# Patient Record
Sex: Female | Born: 1960
Health system: Southern US, Community
[De-identification: ages and names within clinical notes are randomized; demographics above are authoritative.]

## PROBLEM LIST (undated history)

## (undated) DIAGNOSIS — F32A Depression, unspecified: Secondary | ICD-10-CM

## (undated) DIAGNOSIS — N920 Excessive and frequent menstruation with regular cycle: Secondary | ICD-10-CM

## (undated) DIAGNOSIS — I10 Essential (primary) hypertension: Secondary | ICD-10-CM

## (undated) DIAGNOSIS — B20 Human immunodeficiency virus [HIV] disease: Secondary | ICD-10-CM

## (undated) DIAGNOSIS — R350 Frequency of micturition: Secondary | ICD-10-CM

## (undated) DIAGNOSIS — E876 Hypokalemia: Secondary | ICD-10-CM

## (undated) DIAGNOSIS — S0990XA Unspecified injury of head, initial encounter: Secondary | ICD-10-CM

## (undated) DIAGNOSIS — R21 Rash and other nonspecific skin eruption: Secondary | ICD-10-CM

## (undated) DIAGNOSIS — E119 Type 2 diabetes mellitus without complications: Secondary | ICD-10-CM

## (undated) DIAGNOSIS — K219 Gastro-esophageal reflux disease without esophagitis: Secondary | ICD-10-CM

## (undated) DIAGNOSIS — I639 Cerebral infarction, unspecified: Secondary | ICD-10-CM

## (undated) DIAGNOSIS — B182 Chronic viral hepatitis C: Secondary | ICD-10-CM

## (undated) DIAGNOSIS — J309 Allergic rhinitis, unspecified: Secondary | ICD-10-CM

## (undated) DIAGNOSIS — L409 Psoriasis, unspecified: Secondary | ICD-10-CM

## (undated) DIAGNOSIS — R531 Weakness: Secondary | ICD-10-CM

## (undated) DIAGNOSIS — F419 Anxiety disorder, unspecified: Secondary | ICD-10-CM

## (undated) DIAGNOSIS — M009 Pyogenic arthritis, unspecified: Secondary | ICD-10-CM

## (undated) DIAGNOSIS — R600 Localized edema: Secondary | ICD-10-CM

## (undated) DIAGNOSIS — M1990 Unspecified osteoarthritis, unspecified site: Secondary | ICD-10-CM

## (undated) DIAGNOSIS — M1611 Unilateral primary osteoarthritis, right hip: Secondary | ICD-10-CM

## (undated) HISTORY — DX: Anxiety disorder, unspecified: F41.9

## (undated) HISTORY — DX: Rash and other nonspecific skin eruption: R21

## (undated) HISTORY — DX: Type 2 diabetes mellitus without complications: E11.9

## (undated) HISTORY — DX: Depression, unspecified: F32.A

## (undated) HISTORY — DX: Weakness: R53.1

## (undated) HISTORY — DX: Localized edema: R60.0

## (undated) HISTORY — DX: Morbid (severe) obesity due to excess calories: E66.01

## (undated) HISTORY — PX: TOTAL KNEE ARTHROPLASTY: SHX125

## (undated) HISTORY — PX: AXILLARY LYMPH NODE DISSECTION: SHX5229

## (undated) HISTORY — DX: Gastro-esophageal reflux disease without esophagitis: K21.9

## (undated) HISTORY — DX: Cerebral infarction, unspecified: I63.9

## (undated) HISTORY — PX: TOTAL ABDOMINAL HYSTERECTOMY: SHX209

## (undated) HISTORY — DX: Frequency of micturition: R35.0

## (undated) HISTORY — DX: Hypokalemia: E87.6

## (undated) HISTORY — DX: Unilateral primary osteoarthritis, right hip: M16.11

---

## 1998-03-02 ENCOUNTER — Other Ambulatory Visit: Admission: RE | Admit: 1998-03-02 | Discharge: 1998-03-02 | Payer: Self-pay | Admitting: Internal Medicine

## 1998-03-15 ENCOUNTER — Ambulatory Visit (HOSPITAL_BASED_OUTPATIENT_CLINIC_OR_DEPARTMENT_OTHER): Admission: RE | Admit: 1998-03-15 | Discharge: 1998-03-15 | Payer: Self-pay | Admitting: Orthopedic Surgery

## 1998-04-14 ENCOUNTER — Other Ambulatory Visit: Admission: RE | Admit: 1998-04-14 | Discharge: 1998-04-14 | Payer: Self-pay | Admitting: Internal Medicine

## 1998-04-22 ENCOUNTER — Emergency Department (HOSPITAL_COMMUNITY): Admission: EM | Admit: 1998-04-22 | Discharge: 1998-04-22 | Payer: Self-pay | Admitting: Emergency Medicine

## 1998-07-05 ENCOUNTER — Emergency Department (HOSPITAL_COMMUNITY): Admission: EM | Admit: 1998-07-05 | Discharge: 1998-07-05 | Payer: Self-pay | Admitting: Emergency Medicine

## 1998-07-09 ENCOUNTER — Encounter: Payer: Self-pay | Admitting: Emergency Medicine

## 1998-07-09 ENCOUNTER — Emergency Department (HOSPITAL_COMMUNITY): Admission: EM | Admit: 1998-07-09 | Discharge: 1998-07-09 | Payer: Self-pay | Admitting: Emergency Medicine

## 1998-08-10 ENCOUNTER — Encounter: Payer: Self-pay | Admitting: Emergency Medicine

## 1998-08-10 ENCOUNTER — Emergency Department (HOSPITAL_COMMUNITY): Admission: EM | Admit: 1998-08-10 | Discharge: 1998-08-10 | Payer: Self-pay | Admitting: Emergency Medicine

## 1998-10-12 ENCOUNTER — Ambulatory Visit (HOSPITAL_BASED_OUTPATIENT_CLINIC_OR_DEPARTMENT_OTHER): Admission: RE | Admit: 1998-10-12 | Discharge: 1998-10-12 | Payer: Self-pay | Admitting: Orthopedic Surgery

## 1998-12-17 ENCOUNTER — Emergency Department (HOSPITAL_COMMUNITY): Admission: EM | Admit: 1998-12-17 | Discharge: 1998-12-17 | Payer: Self-pay | Admitting: Emergency Medicine

## 1999-04-20 ENCOUNTER — Emergency Department (HOSPITAL_COMMUNITY): Admission: EM | Admit: 1999-04-20 | Discharge: 1999-04-20 | Payer: Self-pay | Admitting: Emergency Medicine

## 1999-07-11 ENCOUNTER — Encounter: Payer: Self-pay | Admitting: Orthopedic Surgery

## 1999-07-12 ENCOUNTER — Inpatient Hospital Stay (HOSPITAL_COMMUNITY): Admission: RE | Admit: 1999-07-12 | Discharge: 1999-07-15 | Payer: Self-pay | Admitting: Orthopedic Surgery

## 1999-10-12 ENCOUNTER — Ambulatory Visit (HOSPITAL_COMMUNITY): Admission: RE | Admit: 1999-10-12 | Discharge: 1999-10-12 | Payer: Self-pay | Admitting: Internal Medicine

## 1999-10-12 ENCOUNTER — Encounter: Payer: Self-pay | Admitting: Internal Medicine

## 1999-11-13 DIAGNOSIS — M009 Pyogenic arthritis, unspecified: Secondary | ICD-10-CM

## 1999-11-13 HISTORY — DX: Pyogenic arthritis, unspecified: M00.9

## 1999-12-16 ENCOUNTER — Inpatient Hospital Stay (HOSPITAL_COMMUNITY): Admission: EM | Admit: 1999-12-16 | Discharge: 1999-12-23 | Payer: Self-pay | Admitting: Emergency Medicine

## 2000-02-06 ENCOUNTER — Encounter: Admission: RE | Admit: 2000-02-06 | Discharge: 2000-02-06 | Payer: Self-pay | Admitting: Internal Medicine

## 2000-03-04 ENCOUNTER — Inpatient Hospital Stay (HOSPITAL_COMMUNITY): Admission: RE | Admit: 2000-03-04 | Discharge: 2000-03-09 | Payer: Self-pay | Admitting: Orthopedic Surgery

## 2000-03-08 ENCOUNTER — Encounter: Payer: Self-pay | Admitting: Orthopedic Surgery

## 2000-05-14 ENCOUNTER — Encounter: Admission: RE | Admit: 2000-05-14 | Discharge: 2000-05-14 | Payer: Self-pay | Admitting: Internal Medicine

## 2000-07-16 ENCOUNTER — Encounter: Admission: RE | Admit: 2000-07-16 | Discharge: 2000-07-16 | Payer: Self-pay | Admitting: Internal Medicine

## 2000-09-17 ENCOUNTER — Encounter: Admission: RE | Admit: 2000-09-17 | Discharge: 2000-09-17 | Payer: Self-pay | Admitting: Internal Medicine

## 2000-10-15 ENCOUNTER — Ambulatory Visit (HOSPITAL_COMMUNITY): Admission: RE | Admit: 2000-10-15 | Discharge: 2000-10-15 | Payer: Self-pay | Admitting: Internal Medicine

## 2000-10-15 ENCOUNTER — Encounter: Payer: Self-pay | Admitting: Internal Medicine

## 2000-11-12 DIAGNOSIS — B20 Human immunodeficiency virus [HIV] disease: Secondary | ICD-10-CM

## 2000-11-12 DIAGNOSIS — Z21 Asymptomatic human immunodeficiency virus [HIV] infection status: Secondary | ICD-10-CM | POA: Insufficient documentation

## 2000-11-12 DIAGNOSIS — Z8619 Personal history of other infectious and parasitic diseases: Secondary | ICD-10-CM

## 2000-11-12 DIAGNOSIS — B182 Chronic viral hepatitis C: Secondary | ICD-10-CM

## 2000-11-12 HISTORY — DX: Chronic viral hepatitis C: B18.2

## 2000-11-12 HISTORY — DX: Asymptomatic human immunodeficiency virus (hiv) infection status: Z21

## 2000-11-12 HISTORY — DX: Human immunodeficiency virus (HIV) disease: B20

## 2000-12-06 ENCOUNTER — Encounter: Admission: RE | Admit: 2000-12-06 | Discharge: 2000-12-06 | Payer: Self-pay | Admitting: Internal Medicine

## 2000-12-21 ENCOUNTER — Emergency Department (HOSPITAL_COMMUNITY): Admission: EM | Admit: 2000-12-21 | Discharge: 2000-12-21 | Payer: Self-pay | Admitting: *Deleted

## 2001-01-15 ENCOUNTER — Encounter: Admission: RE | Admit: 2001-01-15 | Discharge: 2001-01-15 | Payer: Self-pay | Admitting: Internal Medicine

## 2001-02-04 ENCOUNTER — Encounter: Admission: RE | Admit: 2001-02-04 | Discharge: 2001-02-04 | Payer: Self-pay | Admitting: Internal Medicine

## 2001-02-10 ENCOUNTER — Encounter: Admission: RE | Admit: 2001-02-10 | Discharge: 2001-02-10 | Payer: Self-pay | Admitting: Internal Medicine

## 2001-02-10 ENCOUNTER — Ambulatory Visit (HOSPITAL_COMMUNITY): Admission: RE | Admit: 2001-02-10 | Discharge: 2001-02-10 | Payer: Self-pay | Admitting: Internal Medicine

## 2001-02-10 ENCOUNTER — Encounter (INDEPENDENT_AMBULATORY_CARE_PROVIDER_SITE_OTHER): Payer: Self-pay | Admitting: *Deleted

## 2001-02-10 LAB — CONVERTED CEMR LAB
CD4 Count: 660 microliters
CD4 T Cell Abs: 660

## 2001-02-18 ENCOUNTER — Encounter: Admission: RE | Admit: 2001-02-18 | Discharge: 2001-02-18 | Payer: Self-pay | Admitting: Internal Medicine

## 2001-02-27 ENCOUNTER — Encounter: Admission: RE | Admit: 2001-02-27 | Discharge: 2001-02-27 | Payer: Self-pay | Admitting: Internal Medicine

## 2001-03-04 ENCOUNTER — Encounter: Admission: RE | Admit: 2001-03-04 | Discharge: 2001-03-04 | Payer: Self-pay | Admitting: Internal Medicine

## 2001-05-20 ENCOUNTER — Encounter: Admission: RE | Admit: 2001-05-20 | Discharge: 2001-05-20 | Payer: Self-pay | Admitting: Internal Medicine

## 2001-05-20 ENCOUNTER — Ambulatory Visit (HOSPITAL_COMMUNITY): Admission: RE | Admit: 2001-05-20 | Discharge: 2001-05-20 | Payer: Self-pay | Admitting: Internal Medicine

## 2001-06-03 ENCOUNTER — Encounter: Admission: RE | Admit: 2001-06-03 | Discharge: 2001-06-03 | Payer: Self-pay | Admitting: Internal Medicine

## 2001-07-30 ENCOUNTER — Encounter: Admission: RE | Admit: 2001-07-30 | Discharge: 2001-07-30 | Payer: Self-pay | Admitting: Internal Medicine

## 2001-07-30 ENCOUNTER — Ambulatory Visit (HOSPITAL_COMMUNITY): Admission: RE | Admit: 2001-07-30 | Discharge: 2001-07-30 | Payer: Self-pay | Admitting: Internal Medicine

## 2001-08-19 ENCOUNTER — Encounter: Admission: RE | Admit: 2001-08-19 | Discharge: 2001-08-19 | Payer: Self-pay | Admitting: Internal Medicine

## 2001-10-30 ENCOUNTER — Encounter: Admission: RE | Admit: 2001-10-30 | Discharge: 2001-10-30 | Payer: Self-pay | Admitting: Internal Medicine

## 2001-10-30 ENCOUNTER — Ambulatory Visit (HOSPITAL_COMMUNITY): Admission: RE | Admit: 2001-10-30 | Discharge: 2001-10-30 | Payer: Self-pay | Admitting: Internal Medicine

## 2001-11-18 ENCOUNTER — Encounter: Admission: RE | Admit: 2001-11-18 | Discharge: 2001-11-18 | Payer: Self-pay | Admitting: Internal Medicine

## 2002-03-02 ENCOUNTER — Encounter: Admission: RE | Admit: 2002-03-02 | Discharge: 2002-03-02 | Payer: Self-pay | Admitting: Internal Medicine

## 2002-03-02 ENCOUNTER — Ambulatory Visit (HOSPITAL_COMMUNITY): Admission: RE | Admit: 2002-03-02 | Discharge: 2002-03-02 | Payer: Self-pay | Admitting: Internal Medicine

## 2002-03-24 ENCOUNTER — Encounter: Admission: RE | Admit: 2002-03-24 | Discharge: 2002-03-24 | Payer: Self-pay | Admitting: Internal Medicine

## 2002-04-23 ENCOUNTER — Encounter: Admission: RE | Admit: 2002-04-23 | Discharge: 2002-04-23 | Payer: Self-pay | Admitting: Internal Medicine

## 2002-04-23 ENCOUNTER — Ambulatory Visit (HOSPITAL_COMMUNITY): Admission: RE | Admit: 2002-04-23 | Discharge: 2002-04-23 | Payer: Self-pay | Admitting: Internal Medicine

## 2002-05-13 ENCOUNTER — Encounter: Admission: RE | Admit: 2002-05-13 | Discharge: 2002-05-13 | Payer: Self-pay | Admitting: Internal Medicine

## 2002-08-24 ENCOUNTER — Encounter: Admission: RE | Admit: 2002-08-24 | Discharge: 2002-08-24 | Payer: Self-pay | Admitting: Internal Medicine

## 2002-08-24 ENCOUNTER — Ambulatory Visit (HOSPITAL_COMMUNITY): Admission: RE | Admit: 2002-08-24 | Discharge: 2002-08-24 | Payer: Self-pay | Admitting: Internal Medicine

## 2002-09-08 ENCOUNTER — Encounter: Admission: RE | Admit: 2002-09-08 | Discharge: 2002-09-08 | Payer: Self-pay | Admitting: Infectious Diseases

## 2002-12-21 ENCOUNTER — Encounter: Admission: RE | Admit: 2002-12-21 | Discharge: 2002-12-21 | Payer: Self-pay | Admitting: Internal Medicine

## 2003-01-26 ENCOUNTER — Encounter: Admission: RE | Admit: 2003-01-26 | Discharge: 2003-01-26 | Payer: Self-pay | Admitting: Internal Medicine

## 2003-05-18 ENCOUNTER — Ambulatory Visit (HOSPITAL_COMMUNITY): Admission: RE | Admit: 2003-05-18 | Discharge: 2003-05-18 | Payer: Self-pay | Admitting: Internal Medicine

## 2003-05-18 ENCOUNTER — Encounter: Admission: RE | Admit: 2003-05-18 | Discharge: 2003-05-18 | Payer: Self-pay | Admitting: Internal Medicine

## 2003-05-18 ENCOUNTER — Encounter: Payer: Self-pay | Admitting: Internal Medicine

## 2003-05-31 ENCOUNTER — Encounter: Admission: RE | Admit: 2003-05-31 | Discharge: 2003-05-31 | Payer: Self-pay | Admitting: Internal Medicine

## 2003-09-16 ENCOUNTER — Encounter (INDEPENDENT_AMBULATORY_CARE_PROVIDER_SITE_OTHER): Payer: Self-pay | Admitting: *Deleted

## 2003-09-16 ENCOUNTER — Ambulatory Visit (HOSPITAL_COMMUNITY): Admission: RE | Admit: 2003-09-16 | Discharge: 2003-09-16 | Payer: Self-pay | Admitting: *Deleted

## 2003-09-28 ENCOUNTER — Encounter: Payer: Self-pay | Admitting: Internal Medicine

## 2003-09-28 ENCOUNTER — Ambulatory Visit (HOSPITAL_COMMUNITY): Admission: RE | Admit: 2003-09-28 | Discharge: 2003-09-28 | Payer: Self-pay | Admitting: Internal Medicine

## 2003-09-28 ENCOUNTER — Encounter: Admission: RE | Admit: 2003-09-28 | Discharge: 2003-09-28 | Payer: Self-pay | Admitting: Internal Medicine

## 2003-10-29 ENCOUNTER — Encounter (INDEPENDENT_AMBULATORY_CARE_PROVIDER_SITE_OTHER): Payer: Self-pay | Admitting: Infectious Diseases

## 2003-10-29 ENCOUNTER — Encounter: Admission: RE | Admit: 2003-10-29 | Discharge: 2003-10-29 | Payer: Self-pay | Admitting: Infectious Diseases

## 2004-01-03 ENCOUNTER — Encounter: Admission: RE | Admit: 2004-01-03 | Discharge: 2004-01-03 | Payer: Self-pay | Admitting: Internal Medicine

## 2004-01-03 ENCOUNTER — Ambulatory Visit (HOSPITAL_COMMUNITY): Admission: RE | Admit: 2004-01-03 | Discharge: 2004-01-03 | Payer: Self-pay | Admitting: Internal Medicine

## 2004-01-11 ENCOUNTER — Encounter: Admission: RE | Admit: 2004-01-11 | Discharge: 2004-01-11 | Payer: Self-pay | Admitting: Internal Medicine

## 2004-06-20 ENCOUNTER — Emergency Department (HOSPITAL_COMMUNITY): Admission: EM | Admit: 2004-06-20 | Discharge: 2004-06-20 | Payer: Self-pay | Admitting: Emergency Medicine

## 2004-06-21 ENCOUNTER — Encounter: Admission: RE | Admit: 2004-06-21 | Discharge: 2004-06-21 | Payer: Self-pay | Admitting: Internal Medicine

## 2004-06-21 ENCOUNTER — Ambulatory Visit (HOSPITAL_COMMUNITY): Admission: RE | Admit: 2004-06-21 | Discharge: 2004-06-21 | Payer: Self-pay | Admitting: Emergency Medicine

## 2004-07-04 ENCOUNTER — Encounter: Admission: RE | Admit: 2004-07-04 | Discharge: 2004-07-04 | Payer: Self-pay | Admitting: Internal Medicine

## 2004-07-18 ENCOUNTER — Ambulatory Visit: Payer: Self-pay | Admitting: Internal Medicine

## 2004-09-13 ENCOUNTER — Ambulatory Visit (HOSPITAL_COMMUNITY): Admission: RE | Admit: 2004-09-13 | Discharge: 2004-09-13 | Payer: Self-pay | Admitting: Internal Medicine

## 2004-09-13 ENCOUNTER — Ambulatory Visit: Payer: Self-pay | Admitting: Internal Medicine

## 2004-09-29 ENCOUNTER — Ambulatory Visit: Payer: Self-pay | Admitting: Infectious Diseases

## 2004-10-02 ENCOUNTER — Ambulatory Visit: Payer: Self-pay | Admitting: Gastroenterology

## 2004-10-10 ENCOUNTER — Ambulatory Visit: Payer: Self-pay | Admitting: Internal Medicine

## 2005-01-16 ENCOUNTER — Ambulatory Visit: Payer: Self-pay | Admitting: Internal Medicine

## 2005-01-16 ENCOUNTER — Ambulatory Visit (HOSPITAL_COMMUNITY): Admission: RE | Admit: 2005-01-16 | Discharge: 2005-01-16 | Payer: Self-pay | Admitting: Internal Medicine

## 2005-02-06 ENCOUNTER — Ambulatory Visit: Payer: Self-pay | Admitting: Internal Medicine

## 2005-03-22 ENCOUNTER — Ambulatory Visit (HOSPITAL_COMMUNITY): Admission: RE | Admit: 2005-03-22 | Discharge: 2005-03-22 | Payer: Self-pay | Admitting: Internal Medicine

## 2005-03-22 ENCOUNTER — Ambulatory Visit: Payer: Self-pay | Admitting: Internal Medicine

## 2005-04-10 ENCOUNTER — Ambulatory Visit: Payer: Self-pay | Admitting: Internal Medicine

## 2005-04-23 ENCOUNTER — Emergency Department (HOSPITAL_COMMUNITY): Admission: EM | Admit: 2005-04-23 | Discharge: 2005-04-23 | Payer: Self-pay | Admitting: Emergency Medicine

## 2005-06-04 ENCOUNTER — Ambulatory Visit: Payer: Self-pay | Admitting: Internal Medicine

## 2005-07-04 ENCOUNTER — Emergency Department (HOSPITAL_COMMUNITY): Admission: EM | Admit: 2005-07-04 | Discharge: 2005-07-04 | Payer: Self-pay | Admitting: Emergency Medicine

## 2005-08-06 ENCOUNTER — Emergency Department (HOSPITAL_COMMUNITY): Admission: EM | Admit: 2005-08-06 | Discharge: 2005-08-06 | Payer: Self-pay | Admitting: Emergency Medicine

## 2005-08-07 ENCOUNTER — Ambulatory Visit: Payer: Self-pay | Admitting: Internal Medicine

## 2005-08-07 ENCOUNTER — Ambulatory Visit (HOSPITAL_COMMUNITY): Admission: RE | Admit: 2005-08-07 | Discharge: 2005-08-07 | Payer: Self-pay | Admitting: Internal Medicine

## 2005-08-21 ENCOUNTER — Ambulatory Visit: Payer: Self-pay | Admitting: Internal Medicine

## 2005-09-21 ENCOUNTER — Ambulatory Visit: Payer: Self-pay | Admitting: Gastroenterology

## 2005-12-11 ENCOUNTER — Ambulatory Visit: Payer: Self-pay | Admitting: Internal Medicine

## 2005-12-11 ENCOUNTER — Encounter (INDEPENDENT_AMBULATORY_CARE_PROVIDER_SITE_OTHER): Payer: Self-pay | Admitting: *Deleted

## 2005-12-11 LAB — CONVERTED CEMR LAB: CD4 Count: 610 microliters

## 2005-12-25 ENCOUNTER — Ambulatory Visit: Payer: Self-pay | Admitting: Internal Medicine

## 2006-01-27 ENCOUNTER — Emergency Department (HOSPITAL_COMMUNITY): Admission: EM | Admit: 2006-01-27 | Discharge: 2006-01-27 | Payer: Self-pay | Admitting: *Deleted

## 2006-03-27 ENCOUNTER — Ambulatory Visit: Payer: Self-pay | Admitting: Obstetrics and Gynecology

## 2006-04-02 ENCOUNTER — Encounter (INDEPENDENT_AMBULATORY_CARE_PROVIDER_SITE_OTHER): Payer: Self-pay | Admitting: *Deleted

## 2006-04-02 ENCOUNTER — Encounter: Admission: RE | Admit: 2006-04-02 | Discharge: 2006-04-02 | Payer: Self-pay | Admitting: Internal Medicine

## 2006-04-02 ENCOUNTER — Ambulatory Visit: Payer: Self-pay | Admitting: Internal Medicine

## 2006-04-02 LAB — CONVERTED CEMR LAB
CD4 Count: 720 microliters
HIV 1 RNA Quant: 399 copies/mL

## 2006-04-05 ENCOUNTER — Ambulatory Visit (HOSPITAL_COMMUNITY): Admission: RE | Admit: 2006-04-05 | Discharge: 2006-04-05 | Payer: Self-pay | Admitting: Internal Medicine

## 2006-06-27 ENCOUNTER — Ambulatory Visit: Payer: Self-pay | Admitting: Internal Medicine

## 2006-06-27 ENCOUNTER — Encounter: Admission: RE | Admit: 2006-06-27 | Discharge: 2006-06-27 | Payer: Self-pay | Admitting: Internal Medicine

## 2006-06-27 ENCOUNTER — Encounter (INDEPENDENT_AMBULATORY_CARE_PROVIDER_SITE_OTHER): Payer: Self-pay | Admitting: *Deleted

## 2006-06-27 LAB — CONVERTED CEMR LAB: CD4 Count: 490 microliters

## 2006-07-16 ENCOUNTER — Ambulatory Visit: Payer: Self-pay | Admitting: Internal Medicine

## 2006-10-22 ENCOUNTER — Ambulatory Visit: Payer: Self-pay | Admitting: Gastroenterology

## 2007-01-02 ENCOUNTER — Encounter: Admission: RE | Admit: 2007-01-02 | Discharge: 2007-01-02 | Payer: Self-pay | Admitting: Internal Medicine

## 2007-01-02 ENCOUNTER — Ambulatory Visit: Payer: Self-pay | Admitting: Internal Medicine

## 2007-01-02 LAB — CONVERTED CEMR LAB
ALT: 20 units/L (ref 0–35)
Albumin: 3.7 g/dL (ref 3.5–5.2)
Basophils Absolute: 0.1 10*3/uL (ref 0.0–0.1)
CO2: 27 meq/L (ref 19–32)
Creatinine, Ser: 0.66 mg/dL (ref 0.40–1.20)
Eosinophils Absolute: 0.2 10*3/uL (ref 0.0–0.7)
Eosinophils Relative: 3 % (ref 0–5)
HCT: 42.9 % (ref 36.0–46.0)
HIV 1 RNA Quant: 202 copies/mL — ABNORMAL HIGH (ref <50)
HIV-1 RNA Quant, Log: 2.31 — ABNORMAL HIGH (ref <1.70)
Hemoglobin: 14.5 g/dL (ref 12.0–15.0)
Monocytes Relative: 6 % (ref 3–11)
Neutrophils Relative %: 53 % (ref 43–77)
Platelets: 228 10*3/uL (ref 150–400)
Potassium: 3.3 meq/L — ABNORMAL LOW (ref 3.5–5.3)
Sodium: 141 meq/L (ref 135–145)
Total CHOL/HDL Ratio: 2.6
Total Protein: 6.9 g/dL (ref 6.0–8.3)
WBC: 7 10*3/uL (ref 4.0–10.5)

## 2007-01-06 ENCOUNTER — Encounter (INDEPENDENT_AMBULATORY_CARE_PROVIDER_SITE_OTHER): Payer: Self-pay | Admitting: *Deleted

## 2007-01-06 LAB — CONVERTED CEMR LAB: HCV Quantitative: 2086400 intl units/mL

## 2007-01-07 ENCOUNTER — Encounter: Payer: Self-pay | Admitting: Internal Medicine

## 2007-01-19 ENCOUNTER — Encounter (INDEPENDENT_AMBULATORY_CARE_PROVIDER_SITE_OTHER): Payer: Self-pay | Admitting: *Deleted

## 2007-01-20 ENCOUNTER — Telehealth (INDEPENDENT_AMBULATORY_CARE_PROVIDER_SITE_OTHER): Payer: Self-pay | Admitting: Infectious Diseases

## 2007-02-24 DIAGNOSIS — I10 Essential (primary) hypertension: Secondary | ICD-10-CM | POA: Insufficient documentation

## 2007-02-24 DIAGNOSIS — J309 Allergic rhinitis, unspecified: Secondary | ICD-10-CM | POA: Insufficient documentation

## 2007-02-24 DIAGNOSIS — F17200 Nicotine dependence, unspecified, uncomplicated: Secondary | ICD-10-CM

## 2007-02-24 DIAGNOSIS — M1990 Unspecified osteoarthritis, unspecified site: Secondary | ICD-10-CM | POA: Insufficient documentation

## 2007-02-24 DIAGNOSIS — M1710 Unilateral primary osteoarthritis, unspecified knee: Secondary | ICD-10-CM | POA: Insufficient documentation

## 2007-02-24 DIAGNOSIS — D649 Anemia, unspecified: Secondary | ICD-10-CM | POA: Insufficient documentation

## 2007-02-24 DIAGNOSIS — L409 Psoriasis, unspecified: Secondary | ICD-10-CM | POA: Insufficient documentation

## 2007-02-25 DIAGNOSIS — M009 Pyogenic arthritis, unspecified: Secondary | ICD-10-CM

## 2007-02-26 ENCOUNTER — Encounter: Payer: Self-pay | Admitting: Licensed Clinical Social Worker

## 2007-02-26 ENCOUNTER — Ambulatory Visit: Payer: Self-pay | Admitting: Internal Medicine

## 2007-04-17 ENCOUNTER — Ambulatory Visit: Payer: Self-pay | Admitting: Gastroenterology

## 2007-05-22 ENCOUNTER — Ambulatory Visit: Payer: Self-pay | Admitting: Gastroenterology

## 2007-07-01 ENCOUNTER — Telehealth: Payer: Self-pay | Admitting: Internal Medicine

## 2007-10-02 ENCOUNTER — Ambulatory Visit: Payer: Self-pay | Admitting: Gastroenterology

## 2007-10-21 ENCOUNTER — Encounter: Payer: Self-pay | Admitting: Internal Medicine

## 2007-10-23 ENCOUNTER — Ambulatory Visit: Payer: Self-pay | Admitting: Gastroenterology

## 2007-11-11 ENCOUNTER — Encounter: Payer: Self-pay | Admitting: Internal Medicine

## 2007-12-04 ENCOUNTER — Ambulatory Visit: Payer: Self-pay | Admitting: Infectious Diseases

## 2007-12-04 ENCOUNTER — Encounter (INDEPENDENT_AMBULATORY_CARE_PROVIDER_SITE_OTHER): Payer: Self-pay | Admitting: *Deleted

## 2007-12-04 ENCOUNTER — Encounter: Admission: RE | Admit: 2007-12-04 | Discharge: 2007-12-04 | Payer: Self-pay | Admitting: Infectious Diseases

## 2007-12-04 ENCOUNTER — Encounter: Payer: Self-pay | Admitting: Internal Medicine

## 2007-12-04 LAB — CONVERTED CEMR LAB
Albumin: 3.8 g/dL (ref 3.5–5.2)
Alkaline Phosphatase: 66 units/L (ref 39–117)
BUN: 5 mg/dL — ABNORMAL LOW (ref 6–23)
Basophils Absolute: 0 10*3/uL (ref 0.0–0.1)
Calcium: 8.8 mg/dL (ref 8.4–10.5)
Chloride: 108 meq/L (ref 96–112)
Cholesterol: 138 mg/dL (ref 0–200)
Creatinine, Ser: 0.61 mg/dL (ref 0.40–1.20)
Eosinophils Relative: 3 % (ref 0–5)
HDL: 40 mg/dL (ref >39)
HIV-1 RNA Quant, Log: 2.33 — ABNORMAL HIGH (ref <1.70)
Hemoglobin: 14.5 g/dL (ref 12.0–15.0)
Monocytes Absolute: 0.4 10*3/uL (ref 0.1–1.0)
Platelets: 164 10*3/uL (ref 150–400)
RBC: 4.74 M/uL (ref 3.87–5.11)
Total CHOL/HDL Ratio: 3.5
Total Protein: 6.8 g/dL (ref 6.0–8.3)
Triglycerides: 118 mg/dL (ref <150)
VLDL: 24 mg/dL (ref 0–40)
WBC: 5.9 10*3/uL (ref 4.0–10.5)

## 2007-12-15 ENCOUNTER — Encounter (INDEPENDENT_AMBULATORY_CARE_PROVIDER_SITE_OTHER): Payer: Self-pay | Admitting: *Deleted

## 2007-12-18 ENCOUNTER — Ambulatory Visit: Payer: Self-pay | Admitting: Internal Medicine

## 2007-12-18 DIAGNOSIS — R531 Weakness: Secondary | ICD-10-CM

## 2007-12-18 DIAGNOSIS — M6281 Muscle weakness (generalized): Secondary | ICD-10-CM

## 2007-12-18 HISTORY — DX: Weakness: R53.1

## 2007-12-18 HISTORY — DX: Muscle weakness (generalized): M62.81

## 2007-12-18 LAB — CONVERTED CEMR LAB

## 2007-12-22 ENCOUNTER — Encounter: Payer: Self-pay | Admitting: Internal Medicine

## 2008-01-05 ENCOUNTER — Encounter: Admission: RE | Admit: 2008-01-05 | Discharge: 2008-02-24 | Payer: Self-pay | Admitting: Internal Medicine

## 2008-01-16 ENCOUNTER — Encounter: Payer: Self-pay | Admitting: Internal Medicine

## 2008-02-23 ENCOUNTER — Encounter: Payer: Self-pay | Admitting: Internal Medicine

## 2008-05-17 ENCOUNTER — Telehealth: Payer: Self-pay | Admitting: Internal Medicine

## 2008-05-17 ENCOUNTER — Ambulatory Visit: Payer: Self-pay | Admitting: Internal Medicine

## 2008-05-17 ENCOUNTER — Encounter: Admission: RE | Admit: 2008-05-17 | Discharge: 2008-05-17 | Payer: Self-pay | Admitting: Internal Medicine

## 2008-05-17 LAB — CONVERTED CEMR LAB
Albumin: 3.9 g/dL (ref 3.5–5.2)
Alkaline Phosphatase: 80 units/L (ref 39–117)
BUN: 12 mg/dL (ref 6–23)
CO2: 21 meq/L (ref 19–32)
Chloride: 105 meq/L (ref 96–112)
Glucose, Bld: 142 mg/dL — ABNORMAL HIGH (ref 70–99)
Potassium: 4.3 meq/L (ref 3.5–5.3)
Sodium: 139 meq/L (ref 135–145)
Total Protein: 7.3 g/dL (ref 6.0–8.3)

## 2008-05-25 ENCOUNTER — Ambulatory Visit: Payer: Self-pay | Admitting: Internal Medicine

## 2008-05-29 ENCOUNTER — Emergency Department (HOSPITAL_COMMUNITY): Admission: EM | Admit: 2008-05-29 | Discharge: 2008-05-29 | Payer: Self-pay | Admitting: Emergency Medicine

## 2008-07-03 ENCOUNTER — Emergency Department (HOSPITAL_COMMUNITY): Admission: EM | Admit: 2008-07-03 | Discharge: 2008-07-03 | Payer: Self-pay | Admitting: Emergency Medicine

## 2008-11-16 ENCOUNTER — Ambulatory Visit: Payer: Self-pay | Admitting: Internal Medicine

## 2008-11-16 LAB — CONVERTED CEMR LAB
ALT: 25 units/L (ref 0–35)
AST: 29 units/L (ref 0–37)
Albumin: 4 g/dL (ref 3.5–5.2)
Alkaline Phosphatase: 84 units/L (ref 39–117)
Basophils Absolute: 0.1 10*3/uL (ref 0.0–0.1)
Basophils Relative: 1 % (ref 0–1)
CO2: 21 meq/L (ref 19–32)
Cholesterol: 180 mg/dL (ref 0–200)
Creatinine, Ser: 0.5 mg/dL (ref 0.40–1.20)
Eosinophils Relative: 2 % (ref 0–5)
HDL: 63 mg/dL (ref >39)
HIV-1 RNA Quant, Log: 2.57 — ABNORMAL HIGH (ref <1.68)
LDL Cholesterol: 96 mg/dL (ref 0–99)
Neutro Abs: 3.6 10*3/uL (ref 1.7–7.7)
Platelets: 236 10*3/uL (ref 150–400)
Potassium: 4.1 meq/L (ref 3.5–5.3)
Sodium: 142 meq/L (ref 135–145)
Total Bilirubin: 0.4 mg/dL (ref 0.3–1.2)
Total Protein: 7.3 g/dL (ref 6.0–8.3)
Triglycerides: 106 mg/dL (ref <150)
VLDL: 21 mg/dL (ref 0–40)

## 2008-11-30 ENCOUNTER — Ambulatory Visit: Payer: Self-pay | Admitting: Internal Medicine

## 2008-11-30 DIAGNOSIS — E669 Obesity, unspecified: Secondary | ICD-10-CM

## 2008-12-01 ENCOUNTER — Telehealth: Payer: Self-pay | Admitting: Internal Medicine

## 2009-04-28 ENCOUNTER — Ambulatory Visit: Payer: Self-pay | Admitting: Internal Medicine

## 2009-04-28 LAB — CONVERTED CEMR LAB: BUN: 12 mg/dL (ref 6–23)

## 2009-05-17 ENCOUNTER — Ambulatory Visit: Payer: Self-pay | Admitting: Internal Medicine

## 2009-05-26 ENCOUNTER — Telehealth (INDEPENDENT_AMBULATORY_CARE_PROVIDER_SITE_OTHER): Payer: Self-pay | Admitting: *Deleted

## 2009-05-30 ENCOUNTER — Ambulatory Visit: Payer: Self-pay | Admitting: Gastroenterology

## 2009-06-10 ENCOUNTER — Telehealth (INDEPENDENT_AMBULATORY_CARE_PROVIDER_SITE_OTHER): Payer: Self-pay | Admitting: *Deleted

## 2009-06-13 ENCOUNTER — Ambulatory Visit: Payer: Self-pay | Admitting: Gastroenterology

## 2009-07-01 ENCOUNTER — Ambulatory Visit: Payer: Self-pay | Admitting: Gastroenterology

## 2009-07-01 ENCOUNTER — Encounter: Payer: Self-pay | Admitting: Gastroenterology

## 2009-07-13 ENCOUNTER — Encounter: Payer: Self-pay | Admitting: Gastroenterology

## 2009-11-29 ENCOUNTER — Ambulatory Visit: Payer: Self-pay | Admitting: Internal Medicine

## 2009-11-29 LAB — CONVERTED CEMR LAB
Basophils Absolute: 0 10*3/uL (ref 0.0–0.1)
Basophils Relative: 0 % (ref 0–1)
CO2: 24 meq/L (ref 19–32)
Calcium: 9.5 mg/dL (ref 8.4–10.5)
Chloride: 102 meq/L (ref 96–112)
Eosinophils Absolute: 0.3 10*3/uL (ref 0.0–0.7)
Eosinophils Relative: 3 % (ref 0–5)
Glucose, Bld: 130 mg/dL — ABNORMAL HIGH (ref 70–99)
HCV Quantitative: 3400000 intl units/mL — ABNORMAL HIGH (ref <43)
HIV-1 RNA Quant, Log: 2.25 — ABNORMAL HIGH (ref <1.68)
Hemoglobin: 14.8 g/dL (ref 12.0–15.0)
LDL Cholesterol: 98 mg/dL (ref 0–99)
Lymphocytes Relative: 27 % (ref 12–46)
MCV: 88.6 fL (ref >=78.0)
Monocytes Relative: 8 % (ref 3–12)
Neutrophils Relative %: 62 % (ref 43–77)
Platelets: 274 10*3/uL (ref 150–400)
Potassium: 3.5 meq/L (ref 3.5–5.3)
Sodium: 138 meq/L (ref 135–145)
Total Bilirubin: 0.5 mg/dL (ref 0.3–1.2)
Total CHOL/HDL Ratio: 2.8
Triglycerides: 86 mg/dL (ref <150)
VLDL: 17 mg/dL (ref 0–40)

## 2009-12-12 ENCOUNTER — Ambulatory Visit: Payer: Self-pay | Admitting: Internal Medicine

## 2009-12-12 DIAGNOSIS — E785 Hyperlipidemia, unspecified: Secondary | ICD-10-CM | POA: Insufficient documentation

## 2010-05-16 ENCOUNTER — Ambulatory Visit: Payer: Self-pay | Admitting: Internal Medicine

## 2010-05-16 LAB — CONVERTED CEMR LAB
Alkaline Phosphatase: 71 units/L (ref 39–117)
Creatinine, Ser: 0.56 mg/dL (ref 0.40–1.20)
HIV-1 RNA Quant, Log: <1.68 (ref <1.68)
Sodium: 139 meq/L (ref 135–145)

## 2010-05-29 ENCOUNTER — Encounter (INDEPENDENT_AMBULATORY_CARE_PROVIDER_SITE_OTHER): Payer: Self-pay | Admitting: *Deleted

## 2010-05-30 ENCOUNTER — Ambulatory Visit: Payer: Self-pay | Admitting: Internal Medicine

## 2010-06-06 ENCOUNTER — Encounter: Payer: Self-pay | Admitting: Internal Medicine

## 2010-06-21 ENCOUNTER — Encounter: Payer: Self-pay | Admitting: Internal Medicine

## 2010-07-03 ENCOUNTER — Telehealth (INDEPENDENT_AMBULATORY_CARE_PROVIDER_SITE_OTHER): Payer: Self-pay | Admitting: Licensed Clinical Social Worker

## 2010-07-03 ENCOUNTER — Encounter: Payer: Self-pay | Admitting: Internal Medicine

## 2010-07-06 ENCOUNTER — Ambulatory Visit: Payer: Self-pay | Admitting: Internal Medicine

## 2010-07-06 LAB — CONVERTED CEMR LAB
Eosinophils Relative: 2 % (ref 0–5)
HCT: 40.8 % (ref 36.0–46.0)
Lymphocytes Relative: 37 % (ref 12–46)
Lymphs Abs: 2.9 10*3/uL (ref 0.7–4.0)
Monocytes Relative: 8 % (ref 3–12)
Prothrombin Time: 12.9 s (ref 11.6–15.2)
WBC: 7.7 10*3/uL (ref 4.0–10.5)
aPTT: 28 s (ref 24–37)

## 2010-07-18 ENCOUNTER — Encounter: Payer: Self-pay | Admitting: Internal Medicine

## 2010-07-27 ENCOUNTER — Telehealth: Payer: Self-pay | Admitting: Internal Medicine

## 2010-09-14 ENCOUNTER — Ambulatory Visit: Payer: Self-pay | Admitting: Internal Medicine

## 2010-09-14 LAB — CONVERTED CEMR LAB
ALT: 21 units/L (ref 0–35)
BUN: 12 mg/dL (ref 6–23)
CO2: 25 meq/L (ref 19–32)
Calcium: 9.1 mg/dL (ref 8.4–10.5)
Cholesterol: 160 mg/dL (ref 0–200)
Eosinophils Absolute: 0.2 10*3/uL (ref 0.0–0.7)
Eosinophils Relative: 3 % (ref 0–5)
HCT: 38.9 % (ref 36.0–46.0)
Hemoglobin: 13.4 g/dL (ref 12.0–15.0)
Lymphocytes Relative: 45 % (ref 12–46)
Lymphs Abs: 3.1 10*3/uL (ref 0.7–4.0)
MCV: 89.4 fL (ref 78.0–100.0)
Monocytes Absolute: 0.6 10*3/uL (ref 0.1–1.0)
Platelets: 231 10*3/uL (ref 150–400)
Potassium: 3.7 meq/L (ref 3.5–5.3)
RBC: 4.35 M/uL (ref 3.87–5.11)
RDW: 12.8 % (ref 11.5–15.5)
Total Bilirubin: 0.3 mg/dL (ref 0.3–1.2)
Total CHOL/HDL Ratio: 2.3
Total Protein: 6.7 g/dL (ref 6.0–8.3)

## 2010-09-28 ENCOUNTER — Encounter: Payer: Self-pay | Admitting: Internal Medicine

## 2010-09-28 ENCOUNTER — Ambulatory Visit: Payer: Self-pay | Admitting: Internal Medicine

## 2010-09-29 ENCOUNTER — Telehealth (INDEPENDENT_AMBULATORY_CARE_PROVIDER_SITE_OTHER): Payer: Self-pay | Admitting: *Deleted

## 2010-10-04 ENCOUNTER — Encounter (INDEPENDENT_AMBULATORY_CARE_PROVIDER_SITE_OTHER): Payer: Self-pay | Admitting: *Deleted

## 2010-11-23 ENCOUNTER — Telehealth (INDEPENDENT_AMBULATORY_CARE_PROVIDER_SITE_OTHER): Payer: Self-pay | Admitting: *Deleted

## 2010-12-12 NOTE — Assessment & Plan Note (Signed)
Summary: F/U/VS   CC:  bug bite 2-3 days ago on her left upper arm and swelling at site and upper arm.  Follow-up visit.  History of Present Illness: Nicole [name] is in for a routine visit. She denies having any new problems or concerns since her last visit.  She says she never misses a single dose of her medications.  She is not sexually active.  Preventive Screening-Counseling & Management  Alcohol-Tobacco     Alcohol drinks/day: 0     Smoking Status: current     Smoking Cessation Counseling: yes     Smoke Cessation Stage: ready     Packs/Day: 1 cigarette in the AM     Year Started: 1977  Caffeine-Diet-Exercise     Caffeine use/day: yes     Does Patient Exercise: yes     Type of exercise: water aerobics, walking     Exercise (avg: min/session): >60     Times/week: 7  Hep-HIV-STD-Contraception     HIV Risk: no risk noted     HIV Risk Counseling: not indicated-no HIV risk noted  Safety-Violence-Falls     Seat Belt Use: yes  Comments: declined condoms      Sexual History:  n/a.        Drug Use:  never.     Prior Medication List:  ATRIPLA  TABS (EFAVIRENZ-EMTRICITAB-TENOFOVIR TABS) Take 1 tablet by mouth once a day at bedtime FISH OIL CONCENTRATE 300 MG  CAPS (OMEGA-3 FATTY ACIDS) take 3 daily LISINOPRIL-HYDROCHLOROTHIAZIDE 20-25 MG TABS (LISINOPRIL-HYDROCHLOROTHIAZIDE) Take 1 tablet by mouth once a day   Current Allergies (reviewed today): ! KEFLEX Vital Signs:  Patient profile:   50 year old female Menstrual status:  postmenopausal Height:      66 inches (167.64 cm) Weight:      221.5 pounds (100.68 kg) BMI:     35.88 Pulse rate:   64 / minute BP sitting:   115 / 79  (left arm) Cuff size:   large  Vitals Entered By: Jennet Maduro RN (May 30, 2010 8:50 AM) CC: bug bite 2-3 days ago on her left upper arm, swelling at site and upper arm.  Follow-up visit Pain Assessment Patient in pain? no      Nutritional Status BMI of > 30 = obese Nutritional  Status Detail appetite "good"  Have you ever been in a relationship where you felt threatened, hurt or afraid?No   Does patient need assistance? Functional Status Self care Ambulation Normal Comments no missede doses of rxes   Physical Exam  General:  alert and overweight-appearing.   Mouth:  pharynx pink and moist, no erythema, no exudates, and poor dentition.  teeth missing.   Lungs:  normal breath sounds.  no crackles and no wheezes.   Heart:  normal rate, regular rhythm, and no murmur.   Abdomen:  soft, non-tender, normal bowel sounds, no hepatomegaly, and no splenomegaly.   Skin:  no rashes.   Axillary Nodes:  no R axillary adenopathy and no L axillary adenopathy.   Psych:  normally interactive, good eye contact, not anxious appearing, and not depressed appearing.          Medication Adherence: 05/30/2010   Adherence to medications reviewed with patient. Counseling to provide adequate adherence provided   Prevention For Positives: 05/30/2010   Safe sex practices discussed with patient. Condoms offered.  Impression & Recommendations:  Problem # 1:  HIV DISEASE (ICD-042) Her HIV infection remains under excellent control.  I will not make any changes today. Diagnostics Reviewed:  CD4: 1090 (05/17/2010)   WBC: 9.5 (11/29/2009)   Hgb: 14.8 (11/29/2009)   HCT: 42.0 (11/29/2009)   Platelets: 274 (11/29/2009) HIV-1 RNA: <48 copies/mL (05/16/2010)   HBSAg: No (01/06/2007)  Orders: Hepatitis C Clinic Referral (HepC)  Problem # 2:  HEPATITIS C (ICD-070.51) She now says that she would be able to go to [facility] for another evaluation for her hepatitis C.  She was seen years ago in our hepatitis C clinic here in [place].  We were never able to obtain any records from those visits but I do not believe that a biopsy was ever done and she has never been treated.  She does not drink alcohol and does not suffer from depression.  Her liver enzymes  have been normal but her far all of his over 3 million. Orders: Est. Patient Level IV (16109) Hepatitis C Clinic Referral (HepC)  Problem # 3:  DYSLIPIDEMIA (ICD-272.4) Her numbers continue to look very good.  She will continue fish oil capsules. Orders: Est. Patient Level IV (60454)  Labs Reviewed: SGOT: 26 (05/16/2010)   SGPT: 25 (05/16/2010)   HDL:63 (11/29/2009), 63 (11/16/2008)  LDL:98 (11/29/2009), 96 (11/16/2008)  Chol:178 (11/29/2009), 180 (11/16/2008)  Trig:86 (11/29/2009), 106 (11/16/2008)  Problem # 4:  HYPERTENSION (ICD-401.9) her blood pressure is under very good control.  She has not had problems with renal insufficiency or hypokalemia.  I will continue her current medication. Her updated medication list for this problem includes:    Lisinopril-hydrochlorothiazide 20-25 Mg Tabs (Lisinopril-hydrochlorothiazide) .Marland Kitchen... Take 1 tablet by mouth once a day  Orders: Est. Patient Level IV (09811)  BP today: 115/79 Prior BP: 121/82 (12/12/2009)  Labs Reviewed: K+: 3.6 (05/16/2010) Creat: : 0.56 (05/16/2010)   Chol: 178 (11/29/2009)   HDL: 63 (11/29/2009)   LDL: 98 (11/29/2009)   TG: 86 (11/29/2009)  Other Orders: Future Orders: T-CD4SP ([facility]) (CD4SP) ... 11/26/2010 T-HIV Viral Load [phone]) ... 11/26/2010 T-Comprehensive Metabolic Panel [phone]) ... 11/26/2010 T-CBC w/Diff ([id number]) ... 11/26/2010 T-RPR (Syphilis) [id number]) ... 11/26/2010 T-Lipid Profile [phone]) ... 11/26/2010  Patient Instructions: 1)  Please schedule a follow-up appointment in 6 months.         Medication Adherence: 05/30/2010   Adherence to medications reviewed with patient. Counseling to provide adequate adherence provided    Prevention For Positives: 05/30/2010   Safe sex practices discussed with patient. Condoms offered.

## 2010-12-12 NOTE — Miscellaneous (Signed)
Summary: RW update  Clinical Lists Changes  Observations: Added new observation of LAST PAP DAT: HYSTERECTOMY 2004 (05/29/2010 15:57) Added new observation of LAST PAP DAT: HYSTERECTOMY 2004 (11/12/2002 15:57)     R

## 2010-12-12 NOTE — Assessment & Plan Note (Signed)
Summary: Office Visit (Infectious Disease)    CC:  follow-up visit and seen at [facility] for Hep C f/u.  History of Present Illness: Nicole [name] is in for her routine visit.  She never misses any of her medications.  She is not sexually active.  She is down to one cigarette each morning but does not have a plan to quit completely although she says that she would like to.  She has never heard anything about her liver biopsy done at [facility] and she says she does not have a follow-up visit.  She does not recall being told to do anything differently about her hepatitis C.  Preventive Screening-Counseling & Management  Alcohol-Tobacco     Alcohol drinks/day: 0     Smoking Status: current     Smoking Cessation Counseling: yes     Smoke Cessation Stage: ready     Packs/Day: 1 cigarette in the AM     Year Started: 1977  Caffeine-Diet-Exercise     Caffeine use/day: yes     Does Patient Exercise: yes     Type of exercise: water aerobics, walking     Exercise (avg: min/session): >60     Times/week: 7  Hep-HIV-STD-Contraception     HIV Risk: no risk noted     HIV Risk Counseling: not indicated-no HIV risk noted  Safety-Violence-Falls     Seat Belt Use: yes  Comments: declined condoms      Sexual History:  n/a.        Drug Use:  never.     Prior Medication List:  ATRIPLA  TABS (EFAVIRENZ-EMTRICITAB-TENOFOVIR TABS) Take 1 tablet by mouth once a day at bedtime FISH OIL CONCENTRATE 300 MG  CAPS (OMEGA-3 FATTY ACIDS) take 3 daily LISINOPRIL-HYDROCHLOROTHIAZIDE 20-25 MG TABS (LISINOPRIL-HYDROCHLOROTHIAZIDE) Take 1 tablet by mouth once a day   Current Allergies (reviewed today): ! KEFLEX Vital Signs:  Patient profile:   50 year old female Menstrual status:  postmenopausal Height:      66 inches (167.64 cm) Weight:      231.5 pounds (105.23 kg) BMI:     37.50 Temp:     97.8 degrees F (36.56 degrees C) oral Pulse rate:   83 / minute BP sitting:   118 / 79  (left arm) Cuff size:    large  Vitals Entered By: Jennet Maduro RN (September 28, 2010 9:24 AM) CC: follow-up visit, seen at [facility] for Hep C f/u Is Patient Diabetic? No Pain Assessment Patient in pain? no      Nutritional Status BMI of > 30 = obese Nutritional Status Detail appetite "good"  Have you ever been in a relationship where you felt threatened, hurt or afraid?No   Does patient need assistance? Functional Status Self care Ambulation Impaired:Risk for fall Comments slight limp, no missed doses of rxes   Physical Exam  General:  alert and overweight-appearing.   Mouth:  pharynx pink and moist, no erythema, no exudates, and poor dentition.  teeth missing.   Lungs:  normal breath sounds.  no crackles and no wheezes.   Heart:  normal rate, regular rhythm, and no murmur.   Skin:  no rashes.   Inguinal Nodes:  no R inguinal adenopathy and no L inguinal adenopathy.   Psych:  normally interactive, good eye contact, not anxious appearing, and not depressed appearing.      Impression & Recommendations:  Problem # 1:  HIV DISEASE (ICD-042) Her HIV infection remains under excellent control.  I will not make  any changes today. Diagnostics Reviewed:  CD4: 1310 (09/15/2010)   WBC: 6.9 (09/14/2010)   Hgb: 13.4 (09/14/2010)   HCT: 38.9 (09/14/2010)   Platelets: 231 (09/14/2010) HIV-1 RNA: <20 copies/mL (09/14/2010)   HBSAg: No (01/06/2007)  Problem # 2:  HEPATITIS C (ICD-070.51) We will call the hepatitis C clinic at [facility] again for her biopsy results and make sure that she has a follow-up visit. Orders: Est. Patient Level IV (16109)  Problem # 3:  HYPERTENSION (ICD-401.9) Her blood pressure is under good control. Her updated medication list for this problem includes:    Lisinopril-hydrochlorothiazide 20-25 Mg Tabs (Lisinopril-hydrochlorothiazide) .Marland Kitchen... Take 1 tablet by mouth once a day  Orders: Est. Patient Level IV (60454)  Problem # 4:  CIGARETTE SMOKER (ICD-305.1) I have encouraged her  to set up plan to quit smoking completely.  I have given her written information about local resources for cigarette cessation counseling. Orders: Est. Patient Level IV (09811)  Other Orders: Influenza Vaccine MCR (91478) Future Orders: T-CD4SP ([facility]) (CD4SP) ... 03/27/2011 T-HIV Viral Load [id number]) ... 03/27/2011 T-Comprehensive Metabolic Panel [phone]) ... 03/27/2011 T-CBC w/Diff ([id number]) ... 03/27/2011 T-RPR (Syphilis) [phone]) ... 03/27/2011 T-Lipid Profile [id number]) ... 03/27/2011  Patient Instructions: 1)  Please schedule a follow-up appointment in 6 months.      Influenza Vaccine    Vaccine Type: Fluvax MCR    Site: right deltoid    Mfr: novartis    Dose: 0.5 ml    Route: IM    Given by: Jennet Maduro RN    Exp. Date: 02/11/2011    Lot #: [id number]  Flu Vaccine Consent Questions    Do you have a history of severe allergic reactions to this vaccine? no    Any prior history of allergic reactions to egg and/or gelatin? no    Do you have a sensitivity to the preservative Thimersol? no    Do you have a past history of Guillan-Barre Syndrome? no    Do you currently have an acute febrile illness? no    Have you ever had a severe reaction to latex? no    Vaccine information given and explained to patient? yes    Are you currently pregnant? no

## 2010-12-12 NOTE — Assessment & Plan Note (Signed)
Summary: EST-CK/FU/MEDS/CFB   CC:  follow-up visit, has lost 18 pounds since her last visit, and still doing her water aerobics.  History of Present Illness: Nicole [name] is in for her routine visit.  She has not missed a single dose of her Atripla  since her last visit.  She also appears to be taking her fish oil capsules and blood pressure medication correctly and consistently.  She has not been sexually active since she learned of her diagnosis and has no plans to be.  She says she is trying hard to quit smoking and is down to one cigarette a day.  She has not set a quit date.  She never drinks any alcohol. Flu Vaccine Consent Questions     Do you have a history of severe allergic reactions to this vaccine? no    Any prior history of allergic reactions to egg and/or gelatin? no    Do you have a sensitivity to the preservative Thimersol? no    Do you have a past history of Guillan-Barre Syndrome? no    Do you currently have an acute febrile illness? no    Have you ever had a severe reaction to latex? no    Vaccine information given and explained to patient? yes    Are you currently pregnant? no    Lot IONGEX:[id number]   Exp Date:02/09/2010   Manufacturer: [employer]    Site Given  Rightt Deltoid IM Jennet Maduro RN  December 12, 2009 2:30 PM   Preventive Screening-Counseling & Management  Alcohol-Tobacco     Alcohol drinks/day: 0     Smoking Status: current     Smoking Cessation Counseling: yes     Smoke Cessation Stage: ready     Packs/Day: <0.25     Year Started: 1975  Caffeine-Diet-Exercise     Caffeine use/day: yes     Does Patient Exercise: yes     Type of exercise: water aerobics, walking     Exercise (avg: min/session): >60     Times/week: 7  Hep-HIV-STD-Contraception     HIV Risk: no risk noted     HIV Risk Counseling: not indicated-no HIV risk noted  Safety-Violence-Falls     Seat Belt Use: yes  Comments: declinced condoms      Sexual History:  n/a.        Drug  Use:  never.     Prior Medication List:  ATRIPLA  TABS (EFAVIRENZ-EMTRICITAB-TENOFOVIR TABS) Take 1 tablet by mouth once a day at bedtime FISH OIL CONCENTRATE 300 MG  CAPS (OMEGA-3 FATTY ACIDS) take 3 daily LISINOPRIL-HYDROCHLOROTHIAZIDE 20-25 MG TABS (LISINOPRIL-HYDROCHLOROTHIAZIDE) Take 1 tablet by mouth once a day   Current Allergies: ! KEFLEX Social History: Sexual History:  n/a  Vital Signs:  Patient profile:   50 year old female Menstrual status:  postmenopausal Height:      66 inches (167.64 cm) Weight:      229.6 pounds (104.36 kg) BMI:     37.19 Temp:     97.8 degrees F (36.56 degrees C) oral Pulse rate:   90 / minute BP sitting:   121 / 82  (right arm) Cuff size:   large  Vitals Entered By: Jennet Maduro RN (December 12, 2009 2:21 PM) CC: follow-up visit, has lost 18 pounds since her last visit, still doing her water aerobics Is Patient Diabetic? No Nutritional Status BMI of > 30 = obese  Have you ever been in a relationship where you felt threatened, hurt or afraid?No   Does  patient need assistance? Functional Status Self care Ambulation Impaired:Risk for fall Comments slight limp while walking   Physical Exam  General:  alert and overweight-appearing.   Mouth:  pharynx pink and moist, no erythema, no exudates, and poor dentition.  teeth missing.   Lungs:  normal breath sounds.  no crackles and no wheezes.   Heart:  normal rate, regular rhythm, and no murmur.   Abdomen:  soft, non-tender, normal bowel sounds, no hepatomegaly, and no splenomegaly.   Skin:  no rashes.   Axillary Nodes:  no R axillary adenopathy and no L axillary adenopathy.   Psych:  normally interactive, good eye contact, not anxious appearing, and not depressed appearing.      Impression & Recommendations:  Problem # 1:  HIV DISEASE (ICD-042) Her HIV infection remains under good control.  I will not make any changes today. Diagnostics Reviewed:  CD4: 880 (11/30/2009)   WBC: 9.5  (11/29/2009)   Hgb: 14.8 (11/29/2009)   HCT: 42.0 (11/29/2009)   Platelets: 274 (11/29/2009) HIV-1 RNA: 178 (11/29/2009)   HBSAg: No (01/06/2007)  Orders: Est. Patient Level IV (81191)  Problem # 2:  HYPERTENSION (ICD-401.9) Her blood pressure is under good control. Her potassium and renal function are normal.  I will not make any changes today. Her updated medication list for this problem includes:    Lisinopril-hydrochlorothiazide 20-25 Mg Tabs (Lisinopril-hydrochlorothiazide) .Marland Kitchen... Take 1 tablet by mouth once a day  Problem # 3:  HEPATITIS C (ICD-070.51) Her hepatitis C viral load is 3,400,000. I have recommended referral to the hepatitis C clinic at [facility] but she says that she has no transportation and [place] at the ago. Orders: Est. Patient Level IV (47829)  Problem # 4:  CIGARETTE SMOKER (ICD-305.1) I encouraged her to use the tips discussed with her by our clinic social worker and to set a quit date and then make the best effort to quit smoking completely. Orders: Est. Patient Level IV (56213)  Other Orders: Flu Vaccine 14yrs + (08657) Administration Flu vaccine - MCR (Q4696) Future Orders: T-CD4SP ([facility]) (CD4SP) ... 06/10/2010 T-HIV Viral Load [id number]) ... 06/10/2010 T-Comprehensive Metabolic Panel [id number]) ... 06/10/2010  Patient Instructions: 1)  Please schedule a follow-up appointment in 6 months.  Prescriptions: LISINOPRIL-HYDROCHLOROTHIAZIDE 20-25 MG TABS (LISINOPRIL-HYDROCHLOROTHIAZIDE) Take 1 tablet by mouth once a day  #30 x 11   Entered and Authorized by:   Cliffton Asters MD   Signed by:   Cliffton Asters MD on 12/12/2009   Method used:   Print then Give to Patient   RxID:   [id number] FISH OIL CONCENTRATE 300 MG  CAPS (OMEGA-3 FATTY ACIDS) take 3 daily  #90 x 11   Entered and Authorized by:   Cliffton Asters MD   Signed by:   Cliffton Asters MD on 12/12/2009   Method used:   Print then Give to Patient   RxID:   [id number] ATRIPLA  TABS  (EFAVIRENZ-EMTRICITAB-TENOFOVIR TABS) Take 1 tablet by mouth once a day at bedtime  #30 x 11   Entered and Authorized by:   Cliffton Asters MD   Signed by:   Cliffton Asters MD on 12/12/2009   Method used:   Print then Give to Patient   RxID:   [id number]  Process Orders Check Orders Results:     [facility]: Check successful Tests Sent for requisitioning (December 12, 2009 3:09 PM):     06/10/2010: [facility] -- T-HIV Viral Load [phone] (signed)     06/10/2010: [facility] -- T-Comprehensive Metabolic Panel [phone] (signed)

## 2010-12-12 NOTE — Progress Notes (Signed)
  Phone Note Call from Patient   Caller: Patient Summary of Call: Patient called stating that she needed labs before she had her liver biopsies, I called [facility] to see what labs did they need other than her ususal ID labs. I am waiting for a return call.  Initial call taken by: Starleen Arms CMA,  July 03, 2010 11:03 AM  Follow-up for Phone Call        Nurse called stating that they gave the patient a letter staing what she should have, the patient lost the letter. Nurse told me they needed CBC, PT, PTT Follow-up by: Starleen Arms CMA,  July 03, 2010 11:06 AM

## 2010-12-12 NOTE — Miscellaneous (Signed)
  Clinical Lists Changes  Observations: Added new observation of YEARAIDSPOS: 2010  (10/04/2010 9:16) Added new observation of HIV STATUS: CDC-defined AIDS  (10/04/2010 9:16)

## 2010-12-12 NOTE — Letter (Signed)
Summary: [facility]: Referral  [facility]: Referral   Imported By: Florinda Marker 06/08/2010 16:09:49  _____________________________________________________________________  External Attachment:    Type:   Image     Comment:   External Document

## 2010-12-12 NOTE — Consult Note (Signed)
Summary: [facility]:  Liver Biopsy  [facility]:  Liver Biopsy   Imported By: Florinda Marker 07/21/2010 14:37:00  _____________________________________________________________________  External Attachment:    Type:   Image     Comment:   External Document

## 2010-12-12 NOTE — Progress Notes (Signed)
Summary: [facility] to fax Liver Program referral notes  Phone Note Outgoing Call   Call placed by: Jennet Maduro RN,  September 29, 2010 3:07 PM Call placed to: [facility], [phone] Summary of Call: Requested notes and results from referral made to Dr. Brooke Dare.  [facility] to fax notes, etc. Jennet Maduro RN  September 29, 2010 3:08 PM

## 2010-12-12 NOTE — Consult Note (Signed)
Summary: [facility]  [facility]   Imported By: Florinda Marker 12/13/2009 10:31:22  _____________________________________________________________________  External Attachment:    Type:   Image     Comment:   External Document

## 2010-12-12 NOTE — Miscellaneous (Signed)
Summary: Orders Update  Clinical Lists Changes  Orders: Added new Test order of T-CBC w/Diff [phone]) - Signed Added new Test order of T-PTT ([id number]) - Signed Added new Test order of T-Protime, Auto ([id number]) - Signed

## 2010-12-12 NOTE — Consult Note (Signed)
Summary: [facility]: Clinic Notes  [facility]: Clinic Notes   Imported By: Florinda Marker 10/12/2010 10:16:16  _____________________________________________________________________  External Attachment:    Type:   Image     Comment:   External Document

## 2010-12-12 NOTE — Letter (Signed)
Summary: [facility]: GI Medicine  [facility]: GI Medicine   Imported By: Florinda Marker 06/08/2010 16:21:18  _____________________________________________________________________  External Attachment:    Type:   Image     Comment:   External Document

## 2010-12-12 NOTE — Progress Notes (Signed)
Summary: pt calling  Phone Note Call from Patient   Caller: pt Summary of Call: Voicemail Concerned about her biopsy. She has not been informed of the results from [facility] RN  July 27, 2010 3:11 PM   Follow-up for Phone Call        I was aware the bx was done by Dr. Lavada Mesi on 9/6 but have not received any results yet either.Please ask Cybthia if she has a f/u visit with Dr. Jacqualine Mau. If not she should call their clinic to obtain results and schedule f/u. Follow-up by: Cliffton Asters MD,  July 27, 2010 3:42 PM

## 2010-12-14 NOTE — Progress Notes (Signed)
Summary: Pt to call [facility] for f/u visit  Phone Note Call from Patient Call back at Home Phone [phone]   Caller: Patient Reason for Call: Talk to Nurse Summary of Call: Pt called about obtaining a f/u appt at [facility].  RN called [facility], obtained appt # for pt, [phone] for f/u appt. w/ Dr. Tollie Eth.  Pt. called with appt. info and # to call.  Pt. verbalized back the tel. # and MD name to make the appt. Jennet Maduro RN  November 23, 2010 1:30 PM

## 2011-01-01 ENCOUNTER — Encounter: Payer: Self-pay | Admitting: Internal Medicine

## 2011-01-23 NOTE — Consult Note (Signed)
Summary: [facility]   Imported By: Florinda Marker 01/19/2011 12:29:18  _____________________________________________________________________  External Attachment:    Type:   Image     Comment:   External Document

## 2011-01-24 LAB — T-HELPER CELL (CD4) - (RCID CLINIC ONLY): CD4 % Helper T Cell: 42 % (ref 33–55)

## 2011-01-28 LAB — T-HELPER CELL (CD4) - (RCID CLINIC ONLY): CD4 T Cell Abs: 1090 uL (ref 400–2700)

## 2011-01-29 LAB — T-HELPER CELL (CD4) - (RCID CLINIC ONLY): CD4 % Helper T Cell: 36 % (ref 33–55)

## 2011-02-19 LAB — T-HELPER CELL (CD4) - (RCID CLINIC ONLY): CD4 % Helper T Cell: 35 % (ref 33–55)

## 2011-03-30 NOTE — Discharge Summary (Signed)
[place]. [facility]  Patient:    Nicole [name], Nicole [name]                       MRN: [id number] Adm. Date:  03/04/00 Disc. Date: 03/09/00 Attending:  Herold Harms Dictator:   Dorthula Matas, P.A.-C.                           Discharge Summary  PRIMARY DIAGNOSIS:  Infected right total knee arthroplasty.  PROCEDURE:  Right knee total joint revision with radical synovectomy.  SECONDARY PROCEDURES:  Insertion of catheter for venous accessed antibiotics.  HISTORY OF PRESENT ILLNESS:  The patient is currently being treated for right total knee arthroplasty infection.  She underwent right total knee arthroplasty in August of 2000 with evidence and positive cultures for an infection on December 16, 1999, with arthroscopy I&D.  IV antibiotics were switched over to oral treatment.  She initially did improve, but currently has a two to three week history of increased pain and swelling.  No fever.  While in the office prior to this admission, she was once aspirated with a positive aspirate for pus, which was sent off with a finding of Staphylococcus aureus. In light of this, it will be necessary for her to undergo a more radical I&D for treatment of this infection.  ALLERGIES:  KEFLEX which causes rash.  MEDICATIONS AT THE TIME OF ADMISSION: 1. Rifampin 600 mg q.d. 2. Levaquin 500 mg p.o. q.d. 3. Synthroid 0.125 mg q.d. 4. Tylox as needed for pain.  PAST MEDICAL HISTORY:  Usual childhood diseases.  Hypothyroidism.  Skull fracture at age 28 with resultant left-sided weakness.  PAST SURGICAL HISTORY:  Knee scoped in 1995, 1996, and 1999.  Carpal tunnel in 1999.  Rotator cuff repair in 1998.  Total knee arthroplasty in August of 2000.  No difficulties with GET.  SOCIAL HISTORY:  The patient smoked one pack a day of tobacco x 20 years.  No ethanol.  No IV drug abuse.  FAMILY HISTORY:  Positive for mother deceased at age 14 with complications from diabetes.   Father deceased sometime in his 12s due to cancer.  REVIEW OF SYSTEMS:  Denies all.  PHYSICAL EXAMINATION:  VITAL SIGNS:  Temperature 98.6 degrees, pulse 80, respirations 16, BP 130/86.  GENERAL APPEARANCE:  The patient is a 5 foot 5 inch, 265 pound female.  HEENT:  Head:  Normocephalic and atraumatic.  Ears:  The TMs are clear.  Eyes: Equal, round, and reactive to light and accommodation.  Throat and Mouth: Clear, but did show rather poor dentition upper and lower.  NECK:  Supple.  Full range of motion.  CHEST:  Clear to auscultation and percussion.  HEART:  Regular rate and rhythm.  ABDOMEN:  Soft and nontender.  No masses.  Bowel sounds 2+.  EXTREMITIES:  The right knee has a positive effusion with pain to all ranges of motion.  Neurovascularly intact in the right lower extremity.  Well-healed normal scar.  The left side does have some decreased strength of motion, particularly in the arms.  This is her baseline.  SKIN:  Numerous scars, all well healed.  LABORATORY DATA:  Labs, including preoperative CBC, CMET, chest x-ray, EKG, PT, and PTT, were all within normal limits as best can be determined from the preoperative anesthesia report.  Unfortunately those labs are not available for actual review today.  HOSPITAL COURSE:  On the  day of admission, the patient was taken to the operating room at [facility] where she underwent a radical open debridement and synovectomy of the right knee with change out of the plastic patella and tibial bearings.  The components were well fixed upon exam.  The patient was closed with a large bur Hemovac and drains in the knee.  She was placed on perioperative antibiotics.  She was placed on a postoperative PCA pump for pain.  Placed on Coumadin prophylaxis for pharmacy protocol.  She was seen in consultation the following day by infectious disease, who advised regarding continued vancomycin and rifampin.  On the first  postoperative day, the patient was awake and alert in moderate pain, resting comfortably, no nausea or vomiting, vital signs stable, temperature maximum 101.5 degrees, pulse 100, dressing dry, drain output 200 cc per shift, and urine output 1300.  Culture results taken at the time of surgery, as well as at the office, were both pending.  On the second postoperative day, the patient was making slow, but steady progress, still had significant pain, temperature maximum 101.5 degrees, blood pressure stable, respirations 20, and knee cultures remained pending.  The patient was seen by ______ in rehabilitation, who agreed to follow her during her stay to see if she would need an inpatient admission.  The patients hemoglobin on the second postoperative day was 7.7, WBC 7.5, and ______ 1.9.  Cultures and sensitivities were still pending.  She continued ambulation and weightbearing as tolerated.  The PCA was discontinued.  On the third postoperative day, the patient was awake and alert without complaints, INR 2.1, the dressing was dry, Hemovac output slowed to 75 cc per shift, and she continued with antibiotics. On the fourth postoperative day, the patient reported decreased pain.  She received her PICC line on this day and was changed to dual treatment.  She was once again reminded that smoking would interfere with her healing process. She remained neurovascularly intact.  Her discharge was planned.  On the fifth postoperative day, the patients PICC line was in place and functioning well.  DISPOSITION:  She was discharged home on that day on Rocephin and rifampin.  FOLLOW-UP:  She should return per the suggestion of infectious disease.  She should return to the office in one weeks time for follow-up check and sooner if she should have a temperature of 101 degrees and increased pain or drainage from the knee.  ACTIVITY:  She will continue with the knee immobilizer with no real attempts at motion  at this point.  She is weightbearing as tolerated while in the immobilizer.  DIET:  Regular.   DISCHARGE MEDICATIONS:  Coumadin, Tylox for pain, IV Rocephin plus rifampin. DD:  04/01/00 TD:  04/03/00 Job: [id number]
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
## 2011-03-30 NOTE — Discharge Summary (Signed)
[place]. [facility]  Patient:    Nicole [name], Nicole [name]                         MRN: [id number] Adm. Date:  [id number] Disc. Date: [id number] Attending:  Alinda Deem Dictator:   Kerby Less, P.A. CC:         Alinda Deem, M.D.                           Discharge Summary  CHIEF COMPLAINT:  Right knee pain.  HISTORY OF PRESENT ILLNESS:  This is a 50 year old black female, status post right total knee replacement on July 12, 1999, by Alinda Deem, M.D. Still complaining of increased pain and swelling of the knee since the Thursday prior to this admission.  She was in the office today, had her knee aspirated, had an infectious-looking aspirate, and she was put in the hospital for I&D of her right knee.  Please refer to the H&P for further details.  HOSPITAL COURSE:  The patient was admitted and underwent an I&D of the right knee and arthroscopy with irrigation.  She was given preoperative and postoperative antibiotics.  She tolerated the procedure well.  We obtained an infectious disease consult on December 17, 1999, from Fransisco Hertz, M.D., and the knee cultured out Staphylococcus aureus.  She was placed on appropriate antibiotics.  We followed her hospital course carefully, which was a progressive hospital course, without complications.  She was discharged in stable condition after five days of Ancef and seven days of rifampin.  She was sent home on rifampin 300 mg p.o. b.i.d. and we changed the Ancef to Keflex 500 mg p.o. b.i.d.  She would be under treatment for a minimal of six months. The patient rest of her hospital course was that of progression without complications.  Her labs were otherwise unremarkable.  She was ambulating without difficulty prior to discharge.  She was also discharged on Vicodin p.r.n. for pain.  To follow up with Alinda Deem, M.D., in three days. DD:  01/26/00 TD:  01/28/00 Job: [id number]

## 2011-03-30 NOTE — Op Note (Signed)
[name]. [facility]  Patient:    Nicole [name], Nicole [name]                         MRN: [id number] Attending:  Harvie Junior, M.D. Dictator:   Harvie Junior, M.D. CC:         Harvie Junior, M.D.                           Operative Report  PREOPERATIVE DIAGNOSIS:  Infected right knee, status post total knee replacement about seven months ago.  SURGEON:  Dr. Luiz Blare.  ASSISTANT:  Kerby Less, P.A.  ANESTHESIA:  General.  HISTORY OF PRESENT ILLNESS:  The patient is a 50 year old female.  She, seven months ago, had a right total knee replacement.  She had done well up until two  days prior to our evaluation, where she began to develop a sudden onset of severe pain in the right leg.  She said she had difficulty standing on the leg and difficulty moving the leg without pain.  She did not check her temperature at home, but said she thought she might of been having some chills or fevers.  Because of this, we had her come into the office, and x-rays at that time were unremarkable. She did have an effusion, and we aspirated about 80 cc of pus out of the knee. We sent it over to the laboratory.  It had a Gram-stain which was positive for Gram-positive cocci in pairs, but the cell counts came back at greater than 200,000 with 97% polys.  She was taken to the operating room for arthroscopic washout open debridement if needed, and admission to the hospital for intravenous antibiotics. I discussed the case with Dr. Darlina Sicilian prior to surgical intervention.  He felt  that this was the appropriate course of action, that ultimately she would need vancomycin therapy and six weeks of intravenous antibiotics, and a two drug regimen including Rifampin orally for six months.  She is brought to the operating room for washout.  DESCRIPTION OF PROCEDURE:  The patient was brought to the operating room. After adequate anesthesia the patient was placed on the operating  room table.  The right leg was prepped and draped in the usual sterile fashion.  Following this, Esmarch exsanguination was undertaken, and tourniquet was elevated to 350 mmHg. Following this, her previous arthroscopic portals were opened, and a cannula was put in place, pus drained from the knee, and this again was sent for Gram stain and culture.  At this point, 6 liters of fluid were filled into the knee, and a large bore shaver was used to remove fibrinous material from the joint lining.  There was no significant exudate in the knee, and really no significant appearance of adherence of infected type tissue in the knee.  Following this, another 6 liters of fluid was instilled into the knee, and continuation of shaving, removing all fibrinous material was undertaken.  The inferior/superior portal was introduced, and through this the knee was flushed significantly in the anterior/posterior portions.  The cannula was used to flush fluid back behind the post.  The platform was identified, the post was identified, the medial and lateral gutters were cleaned.  The superior patellar pouch was cleaned with the shaver.  Following this, two bags of bacitracin antibiotic solution was instilled into the knee, and the  knee was suctioned dry.  Another  6 liters of fluid was then hung, and these were washed through the knee to insure that adequate washout was undertaken.  Two large Hemovac drains were then instilled into the knee and sewn in place after the knee was suctioned dry.  At this point, a sterile compressive dressing was applied. The patient was taken to the recovery room where she was noted to be in satisfactory condition.  ESTIMATED BLOOD LOSS:  None. DD:  12/16/99 TD:  12/17/99 Job: [id number]

## 2011-04-03 ENCOUNTER — Other Ambulatory Visit (INDEPENDENT_AMBULATORY_CARE_PROVIDER_SITE_OTHER): Payer: Medicare Other

## 2011-04-03 DIAGNOSIS — B20 Human immunodeficiency virus [HIV] disease: Secondary | ICD-10-CM

## 2011-04-03 DIAGNOSIS — Z79899 Other long term (current) drug therapy: Secondary | ICD-10-CM

## 2011-04-03 DIAGNOSIS — Z113 Encounter for screening for infections with a predominantly sexual mode of transmission: Secondary | ICD-10-CM

## 2011-04-04 LAB — COMPLETE METABOLIC PANEL WITH GFR
BUN: 12 mg/dL (ref 6–23)
CO2: 23 mEq/L (ref 19–32)
Creat: 0.63 mg/dL (ref 0.40–1.20)
GFR, Est African American: >60 mL/min (ref >60)
GFR, Est Non African American: >60 mL/min (ref >60)
Glucose, Bld: 123 mg/dL — ABNORMAL HIGH (ref 70–99)
Total Bilirubin: 0.4 mg/dL (ref 0.3–1.2)

## 2011-04-04 LAB — CBC WITH DIFFERENTIAL/PLATELET
Basophils Relative: 1 % (ref 0–1)
Hemoglobin: 14.4 g/dL (ref 12.0–15.0)
Lymphs Abs: 3.2 10*3/uL (ref 0.7–4.0)
Monocytes Relative: 8 % (ref 3–12)
Neutro Abs: 4 10*3/uL (ref 1.7–7.7)
Neutrophils Relative %: 49 % (ref 43–77)
Platelets: 239 10*3/uL (ref 150–400)
RBC: 4.63 MIL/uL (ref 3.87–5.11)

## 2011-04-04 LAB — LIPID PANEL
Cholesterol: 196 mg/dL (ref 0–200)
HDL: 86 mg/dL (ref >39)
Total CHOL/HDL Ratio: 2.3 Ratio

## 2011-04-04 LAB — RPR

## 2011-04-04 LAB — T-HELPER CELL (CD4) - (RCID CLINIC ONLY): CD4 % Helper T Cell: 40 % (ref 33–55)

## 2011-04-17 ENCOUNTER — Encounter: Payer: Self-pay | Admitting: Internal Medicine

## 2011-04-17 ENCOUNTER — Ambulatory Visit (INDEPENDENT_AMBULATORY_CARE_PROVIDER_SITE_OTHER): Payer: Medicare Other | Admitting: Internal Medicine

## 2011-04-17 VITALS — BP 124/83 | HR 73 | Temp 97.9°F | Ht 68.0 in | Wt 247.2 lb

## 2011-04-17 DIAGNOSIS — I10 Essential (primary) hypertension: Secondary | ICD-10-CM

## 2011-04-17 DIAGNOSIS — Z21 Asymptomatic human immunodeficiency virus [HIV] infection status: Secondary | ICD-10-CM

## 2011-04-17 DIAGNOSIS — Z23 Encounter for immunization: Secondary | ICD-10-CM

## 2011-04-17 DIAGNOSIS — B1710 Acute hepatitis C without hepatic coma: Secondary | ICD-10-CM

## 2011-04-17 DIAGNOSIS — B20 Human immunodeficiency virus [HIV] disease: Secondary | ICD-10-CM

## 2011-04-17 DIAGNOSIS — F17200 Nicotine dependence, unspecified, uncomplicated: Secondary | ICD-10-CM

## 2011-04-17 NOTE — Assessment & Plan Note (Signed)
She saw Dr. Jacqualine Mau in the [facility] hepatology clinic in February. His clinic note indicated that her repeat biopsy showed no progressive fibrosis. He discussed treatment options with her and she did not want to start on any injectable interferon so he suggested that she not undergo any treatment at this time but followup in one year.

## 2011-04-17 NOTE — Assessment & Plan Note (Signed)
I have given her written information about the [place] quit line.

## 2011-04-17 NOTE — Assessment & Plan Note (Addendum)
Her blood pressure is under better control. I will continue her current regimen. 

## 2011-04-17 NOTE — Assessment & Plan Note (Signed)
Her infection remains under excellent control. I will continue Atripla.

## 2011-04-17 NOTE — Progress Notes (Signed)
  Subjective:    Patient ID: Nicole [name], female    DOB: July 09, 1961, 50 y.o.   MRN: [id number]  HPI [name] is in for her routine visit. She denies missing a single dose of her medications and states she is feeling well. She believes she is due for her annual mammogram. She continues to smoke cigarettes but says that she is interested in quitting. She does not have any plan to quit.    Review of Systems     Objective:   Physical Exam  Constitutional: No distress.  HENT:  Mouth/Throat: Oropharyngeal exudate present.       She has poor dentition but this is unchanged.  Cardiovascular: Normal rate, regular rhythm and normal heart sounds.   No murmur heard. Pulmonary/Chest: Breath sounds normal.  Psychiatric: She has a normal mood and affect.          Assessment & Plan:

## 2011-04-18 ENCOUNTER — Other Ambulatory Visit: Payer: Self-pay | Admitting: Internal Medicine

## 2011-04-18 DIAGNOSIS — Z1231 Encounter for screening mammogram for malignant neoplasm of breast: Secondary | ICD-10-CM

## 2011-04-26 ENCOUNTER — Ambulatory Visit (HOSPITAL_COMMUNITY)
Admission: RE | Admit: 2011-04-26 | Discharge: 2011-04-26 | Disposition: A | Discharge status: Home / Self Care | Payer: Medicare Other | Source: Ambulatory Visit | Attending: Internal Medicine | Admitting: Internal Medicine

## 2011-04-26 DIAGNOSIS — Z1231 Encounter for screening mammogram for malignant neoplasm of breast: Secondary | ICD-10-CM | POA: Insufficient documentation

## 2011-04-28 ENCOUNTER — Other Ambulatory Visit: Payer: Self-pay | Admitting: Internal Medicine

## 2011-04-30 ENCOUNTER — Other Ambulatory Visit (INDEPENDENT_AMBULATORY_CARE_PROVIDER_SITE_OTHER): Payer: Medicare Other | Admitting: *Deleted

## 2011-04-30 DIAGNOSIS — I10 Essential (primary) hypertension: Secondary | ICD-10-CM

## 2011-04-30 DIAGNOSIS — B20 Human immunodeficiency virus [HIV] disease: Secondary | ICD-10-CM

## 2011-04-30 MED ORDER — EFAVIRENZ-EMTRICITAB-TENOFOVIR 600-200-300 MG PO TABS: 1.0000 | ORAL_TABLET | Freq: Every day | ORAL | Status: DC

## 2011-04-30 MED ORDER — LISINOPRIL-HYDROCHLOROTHIAZIDE 20-25 MG PO TABS: 1.0000 | ORAL_TABLET | Freq: Every day | ORAL | Status: DC

## 2011-08-03 LAB — T-HELPER CELL (CD4) - (RCID CLINIC ONLY): CD4 % Helper T Cell: 35

## 2011-08-09 LAB — T-HELPER CELL (CD4) - (RCID CLINIC ONLY)
CD4 % Helper T Cell: 36
CD4 T Cell Abs: 1070

## 2011-09-03 ENCOUNTER — Telehealth: Payer: Self-pay | Admitting: *Deleted

## 2011-09-03 NOTE — Telephone Encounter (Signed)
States she fell last week & injured her R knee. She has been using rubbing akcohol on it & taking aleve. Large knot on it & she wants to be seen. Transferred to front desk to make an appt

## 2011-09-14 ENCOUNTER — Ambulatory Visit (INDEPENDENT_AMBULATORY_CARE_PROVIDER_SITE_OTHER): Payer: Medicare Other | Admitting: Licensed Clinical Social Worker

## 2011-09-14 DIAGNOSIS — Z23 Encounter for immunization: Secondary | ICD-10-CM

## 2011-10-09 ENCOUNTER — Other Ambulatory Visit: Payer: Medicare Other

## 2011-10-09 ENCOUNTER — Other Ambulatory Visit: Payer: Self-pay | Admitting: Internal Medicine

## 2011-10-09 DIAGNOSIS — I10 Essential (primary) hypertension: Secondary | ICD-10-CM

## 2011-10-09 DIAGNOSIS — B20 Human immunodeficiency virus [HIV] disease: Secondary | ICD-10-CM

## 2011-10-09 LAB — COMPREHENSIVE METABOLIC PANEL
ALT: 22 U/L (ref 0–35)
AST: 23 U/L (ref 0–37)
Alkaline Phosphatase: 81 U/L (ref 39–117)
Chloride: 101 mEq/L (ref 96–112)
Creat: 0.71 mg/dL (ref 0.50–1.10)
Total Bilirubin: 0.4 mg/dL (ref 0.3–1.2)

## 2011-10-11 LAB — HIV-1 RNA QUANT-NO REFLEX-BLD
HIV 1 RNA Quant: <20 copies/mL (ref <20)
HIV-1 RNA Quant, Log: <1.3 {Log} (ref <1.30)

## 2011-10-23 ENCOUNTER — Ambulatory Visit: Payer: Medicare Other | Admitting: Internal Medicine

## 2011-10-23 ENCOUNTER — Encounter: Payer: Self-pay | Admitting: Internal Medicine

## 2011-10-23 ENCOUNTER — Ambulatory Visit (INDEPENDENT_AMBULATORY_CARE_PROVIDER_SITE_OTHER): Payer: Medicare Other | Admitting: Internal Medicine

## 2011-10-23 VITALS — BP 137/83 | HR 63 | Temp 98.4°F | Ht 68.0 in | Wt 248.5 lb

## 2011-10-23 DIAGNOSIS — Z9889 Other specified postprocedural states: Secondary | ICD-10-CM

## 2011-10-23 DIAGNOSIS — B20 Human immunodeficiency virus [HIV] disease: Secondary | ICD-10-CM

## 2011-10-23 DIAGNOSIS — Z113 Encounter for screening for infections with a predominantly sexual mode of transmission: Secondary | ICD-10-CM

## 2011-10-23 DIAGNOSIS — B1710 Acute hepatitis C without hepatic coma: Secondary | ICD-10-CM

## 2011-10-23 DIAGNOSIS — Z79899 Other long term (current) drug therapy: Secondary | ICD-10-CM

## 2011-10-23 MED ORDER — TRAMADOL HCL 50 MG PO TABS: 50.0000 mg | ORAL_TABLET | Freq: Three times a day (TID) | ORAL | Status: DC | PRN

## 2011-10-23 NOTE — Assessment & Plan Note (Signed)
She is due to followup at the [facility] hepatitis C clinic in February.

## 2011-10-23 NOTE — Assessment & Plan Note (Signed)
I will give her a prescription for tramadol. I've asked her to schedule a followup visit with Dr. Gean Birchwood, the orthopedic surgeon who did her joint replacement many years ago.

## 2011-10-23 NOTE — Assessment & Plan Note (Signed)
Her infection remains under excellent control. I will continue Atripla.

## 2011-10-23 NOTE — Progress Notes (Signed)
  Subjective:    Patient ID: Nicole [name], female    DOB: [birth year], 50 y.o.   MRN: [id number]  HPI Kaliyan is in for her routine visit. She denies missing any of her medications. She is doing well with the exception of some right knee pain. She recently went on a cruise to [place]. While there she stumbled and fell on her right knee. She has been having pain in that knee ever since. The pain is most noticeable when she's trying to get up out of the chair. It bothers her on a daily basis and she rates it about 5/10. She states that she took Tylenol without relief and then tried Aleve and it did not seem to help either. She has not had any fever.    Review of Systems     Objective:   Physical Exam  Constitutional: No distress.  HENT:  Mouth/Throat: Oropharynx is clear and moist. No oropharyngeal exudate.       She has carious and missing teeth.  Cardiovascular: Normal rate, regular rhythm and normal heart sounds.   No murmur heard. Pulmonary/Chest: Breath sounds normal. She has no wheezes. She has no rales.  Abdominal: Soft. There is no tenderness.       Obese.  Musculoskeletal:       She has a healed surgical incision over her right knee from her previous prosthetic joint replacement. There is some mild chronic swelling. Her right knee is only slightly warmer than her left knee. She feels it is more swollen than normal but it does not really appear any different to me than on previous exams.  Skin: No rash noted.  Psychiatric: She has a normal mood and affect.   HIV 1 RNA Quant (copies/mL)  Date Value  10/09/2011 <20   04/03/2011 <20   09/14/2010 <20 copies/mL      CD4 T Cell Abs (cmm)  Date Value  10/09/2011 1470   04/03/2011 1320   09/14/2010 1310             Assessment & Plan:

## 2011-11-28 ENCOUNTER — Other Ambulatory Visit: Payer: Self-pay | Admitting: *Deleted

## 2011-11-28 DIAGNOSIS — E785 Hyperlipidemia, unspecified: Secondary | ICD-10-CM

## 2011-11-28 DIAGNOSIS — I10 Essential (primary) hypertension: Secondary | ICD-10-CM

## 2011-11-28 DIAGNOSIS — B20 Human immunodeficiency virus [HIV] disease: Secondary | ICD-10-CM

## 2011-11-28 MED ORDER — EFAVIRENZ-EMTRICITAB-TENOFOVIR 600-200-300 MG PO TABS: 1.0000 | ORAL_TABLET | Freq: Every day | ORAL | Status: DC

## 2011-11-28 MED ORDER — FISH OIL 300 MG PO CAPS: 3.0000 | ORAL_CAPSULE | Freq: Every day | ORAL | Status: DC

## 2011-11-28 MED ORDER — LISINOPRIL-HYDROCHLOROTHIAZIDE 20-25 MG PO TABS: 1.0000 | ORAL_TABLET | Freq: Every day | ORAL | Status: DC

## 2011-11-28 NOTE — Telephone Encounter (Signed)
Refill rxes 

## 2011-12-10 ENCOUNTER — Telehealth: Payer: Self-pay | Admitting: *Deleted

## 2011-12-10 NOTE — Telephone Encounter (Signed)
Patient called regarding form to ride [employer].  She will drop it off for Dr. Orvan Falconer to sign. Wendall Mola CMA

## 2011-12-20 ENCOUNTER — Telehealth: Payer: Self-pay | Admitting: *Deleted

## 2011-12-20 NOTE — Telephone Encounter (Signed)
Patient called to see if the forms she dropped off 12/14/11 have been completed yet. Advised her after speaking with D. Esteridge that they are not done yet and to check back on Monday 12/24/11.

## 2011-12-24 ENCOUNTER — Encounter: Payer: Self-pay | Admitting: Gastroenterology

## 2011-12-26 ENCOUNTER — Telehealth: Payer: Self-pay | Admitting: *Deleted

## 2011-12-26 NOTE — Telephone Encounter (Signed)
Pt states she dropped a form off last week & wants to know if it was ready. It is not. Told her we will call when it is

## 2011-12-27 ENCOUNTER — Telehealth: Payer: Self-pay | Admitting: *Deleted

## 2011-12-27 NOTE — Telephone Encounter (Signed)
Dr. Orvan Falconer needing pt's assistance in completing [employer].  RN asked the patient questions from the application.  Application placed back in Dr. Blair Dolphin box for review and signature.

## 2012-01-01 ENCOUNTER — Telehealth: Payer: Self-pay | Admitting: *Deleted

## 2012-01-01 NOTE — Telephone Encounter (Signed)
Dr. Orvan Falconer signed paperwork for [employer].  Copy made to be scanned into chart.  Original mailed to the pt per her instructions.

## 2012-01-04 NOTE — Telephone Encounter (Signed)
Paperwork completed and signed by Dr. Orvan Falconer.  Phone call to pt to find out how she would like to obtain paperwork.  The pt needs to sign the forms before they are complete and ready to be sent to [facility].  Pt stated that she would like them mailed to her.

## 2012-01-23 ENCOUNTER — Telehealth: Payer: Self-pay | Admitting: *Deleted

## 2012-01-23 NOTE — Telephone Encounter (Signed)
Faxed [employer] application after question 5 had been answered

## 2012-01-23 NOTE — Telephone Encounter (Signed)
States the [employer] people got the form done in Feb. Question 5 was missed. This has been done. I called them to see if we could fax it (no fax number on the form) I had to leave a message

## 2012-03-12 HISTORY — PX: KNEE ARTHROSCOPY: SUR90

## 2012-04-22 ENCOUNTER — Other Ambulatory Visit: Payer: Medicare Other

## 2012-04-22 DIAGNOSIS — B20 Human immunodeficiency virus [HIV] disease: Secondary | ICD-10-CM

## 2012-04-22 DIAGNOSIS — Z79899 Other long term (current) drug therapy: Secondary | ICD-10-CM

## 2012-04-22 DIAGNOSIS — Z113 Encounter for screening for infections with a predominantly sexual mode of transmission: Secondary | ICD-10-CM

## 2012-04-22 LAB — CBC
Hemoglobin: 14.1 g/dL (ref 12.0–15.0)
MCH: 32.5 pg (ref 26.0–34.0)
MCHC: 36.3 g/dL — ABNORMAL HIGH (ref 30.0–36.0)
MCV: 89.4 fL (ref 78.0–100.0)

## 2012-04-22 LAB — COMPREHENSIVE METABOLIC PANEL
Alkaline Phosphatase: 80 U/L (ref 39–117)
CO2: 26 mEq/L (ref 19–32)
Creat: 0.75 mg/dL (ref 0.50–1.10)
Glucose, Bld: 100 mg/dL — ABNORMAL HIGH (ref 70–99)
Sodium: 143 mEq/L (ref 135–145)
Total Bilirubin: 0.4 mg/dL (ref 0.3–1.2)
Total Protein: 7 g/dL (ref 6.0–8.3)

## 2012-04-22 LAB — LIPID PANEL
Cholesterol: 158 mg/dL (ref 0–200)
LDL Cholesterol: 70 mg/dL (ref 0–99)
Total CHOL/HDL Ratio: 2.2 Ratio
Triglycerides: 85 mg/dL (ref <150)
VLDL: 17 mg/dL (ref 0–40)

## 2012-04-23 LAB — HIV-1 RNA QUANT-NO REFLEX-BLD: HIV 1 RNA Quant: <20 copies/mL (ref <20)

## 2012-04-23 LAB — T-HELPER CELL (CD4) - (RCID CLINIC ONLY): CD4 % Helper T Cell: 42 % (ref 33–55)

## 2012-04-28 ENCOUNTER — Encounter: Payer: Self-pay | Admitting: *Deleted

## 2012-05-06 ENCOUNTER — Ambulatory Visit: Payer: Medicare Other | Admitting: Internal Medicine

## 2012-05-20 ENCOUNTER — Ambulatory Visit (INDEPENDENT_AMBULATORY_CARE_PROVIDER_SITE_OTHER): Payer: Medicare Other | Admitting: Internal Medicine

## 2012-05-20 ENCOUNTER — Encounter: Payer: Self-pay | Admitting: Internal Medicine

## 2012-05-20 VITALS — BP 118/81 | HR 70 | Temp 97.8°F | Ht 66.0 in | Wt 238.5 lb

## 2012-05-20 DIAGNOSIS — Z0000 Encounter for general adult medical examination without abnormal findings: Secondary | ICD-10-CM

## 2012-05-20 DIAGNOSIS — Z79899 Other long term (current) drug therapy: Secondary | ICD-10-CM

## 2012-05-20 DIAGNOSIS — B20 Human immunodeficiency virus [HIV] disease: Secondary | ICD-10-CM

## 2012-05-20 NOTE — Addendum Note (Signed)
Addended by: Jennet Maduro D on: 05/20/2012 11:05 AM   Modules accepted: Orders

## 2012-05-20 NOTE — Progress Notes (Signed)
Patient ID: Nicole [name], female   DOB: September 19, 1961, 51 y.o.   MRN: [id number]     [facility] for Infectious Disease  Patient Active Problem List  Diagnosis  . HIV DISEASE  . HEPATITIS C  . HYPOTHYROIDISM  . DYSLIPIDEMIA  . MORBID OBESITY  . ANEMIA, NORMOCYTIC  . CIGARETTE SMOKER  . WEAKNESS, LEFT SIDE OF BODY  . HYPERTENSION  . ALLERGIC RHINITIS  . MENORRHAGIA  . PSORIASIS  . ARTHRITIS, SEPTIC  . DEGENERATIVE JOINT DISEASE  . HEAD TRAUMA, CLOSED  . HYSTERECTOMY, HX OF  . TOTAL KNEE REPLACEMENT, HX OF    Patient's Medications  New Prescriptions   No medications on file  Previous Medications   EFAVIRENZ-EMTRICTABINE-TENOFOVIR (ATRIPLA) 600-200-300 MG PER TABLET    Take 1 tablet by mouth daily.   LISINOPRIL-HYDROCHLOROTHIAZIDE (PRINZIDE,ZESTORETIC) 20-25 MG PER TABLET    Take 1 tablet by mouth daily.   OMEGA-3 FATTY ACIDS (FISH OIL) 300 MG CAPS    Take 3 capsules (900 mg total) by mouth daily.   TRAMADOL (ULTRAM) 50 MG TABLET    Take 1 tablet (50 mg total) by mouth every 8 (eight) hours as needed for pain. Maximum dose= 8 tablets per day  Modified Medications   No medications on file  Discontinued Medications   No medications on file    Subjective: Nicole [name] is in for her routine visit. As usual she never misses a dose of her Atripla. She states that she went to the hepatitis C clinic at [facility] earlier this year and was told again that she did not need treatment.  Objective: Temp: 97.8 F (36.6 C) (07/09 1027) Temp src: Oral (07/09 1027) BP: 118/81 mmHg (07/09 1027) Pulse Rate: 70  (07/09 1027)  General: She is in good spirits Oral: Missing teeth and poor dentition Skin: No rash Lungs: Clear Cor: Regular S1-S2 no murmurs  Lab Results HIV 1 RNA Quant (copies/mL)  Date Value  04/22/2012 <20   10/09/2011 <20   04/03/2011 <20      CD4 T Cell Abs (cmm)  Date Value  04/22/2012 1210   10/09/2011 1470   04/03/2011 1320      Assessment: Her HIV infection remains  under excellent control. I will continue Atripla.  Plan: 1. Continue Atripla 2. Return after blood work in 6 months 3. Followup at the hepatitis C clinic early next year 4. Encouraged to establish primary care   Cliffton Asters, MD [facility] for Infectious Disease [facility] [phone] pager   [phone] cell 05/20/2012, 10:39 AM

## 2012-05-27 ENCOUNTER — Encounter: Payer: Self-pay | Admitting: *Deleted

## 2012-05-27 NOTE — Progress Notes (Signed)
Patient ID: Nicole [name], female   DOB: Jan 14, 1961, 51 y.o.   MRN: [id number]  Pt has referral for PCP. Sent pt info to Dr. Linwood Dibbles office at [facility]. (f: [phone], P: [phone]). Tacey Heap RN

## 2012-05-27 NOTE — Progress Notes (Signed)
Patient ID: Nicole [name], female   DOB: 1961-08-29, 51 y.o.   MRN: [id number]  [facility] are currently NOT taking Medicaid patients. PCP is currently [facility]. I left message with patient instructing her that on her medicaid card is her designated PCP and she can schedule an appt with them. If she wants to choose another PCP I have sent a list to her in the mail along with Dept of SS number who she would need to call to be able to change her PCP. I am canceling her PCP referral since this is done through [facility] services. Tacey Heap RN

## 2012-05-29 ENCOUNTER — Emergency Department (INDEPENDENT_AMBULATORY_CARE_PROVIDER_SITE_OTHER)
Admission: EM | Admit: 2012-05-29 | Discharge: 2012-05-29 | Disposition: A | Discharge status: Home / Self Care | Payer: Medicare Other | Source: Home / Self Care | Attending: Emergency Medicine | Admitting: Emergency Medicine

## 2012-05-29 ENCOUNTER — Encounter (HOSPITAL_COMMUNITY): Payer: Self-pay | Admitting: Emergency Medicine

## 2012-05-29 DIAGNOSIS — K047 Periapical abscess without sinus: Secondary | ICD-10-CM

## 2012-05-29 MED ORDER — CLINDAMYCIN HCL 300 MG PO CAPS: 300.0000 mg | ORAL_CAPSULE | Freq: Four times a day (QID) | ORAL | Status: AC

## 2012-05-29 NOTE — ED Provider Notes (Signed)
Chief Complaint  Patient presents with  . Dental Pain    History of Present Illness:   Nicole [name] is a 51 year old female who has had a five-day history of pain in her left lower dentition. She has a little bit of swelling of her jaw externally. There is no swelling of the floor the mouth, it does hurt to chew. She denies any purulent drainage. No trouble swallowing or breathing. No fever, chills, headache, facial, eye pain. No neck pain or swelling. No coughing, wheezing, chest pain, or shortness of breath.  Review of Systems:  Other than noted above, the patient denies any of the following symptoms: Systemic:  No fever, chills, sweats or weight loss. ENT:  No headache, ear ache, sore throat, nasal congestion, facial pain, or swelling. Lymphatic:  No adenopathy. Lungs:  No coughing, wheezing or shortness of breath.  PMFSH:  Past medical history, family history, social history, meds, and allergies were reviewed.  Physical Exam:   Vital signs:  BP 107/68  Pulse 74  Temp 98.1 F (36.7 C) (Oral)  Resp 16  SpO2 97% General:  Alert, oriented, in no distress. ENT:  TMs and canals normal.  Nasal mucosa normal. Mouth exam:  Her teeth are in surprisingly good shape. She only has 3 teeth on her left lower dentition left posteriorly, 2 molars and premolar. These all appear intact but they're tender to touch. There is no swelling of the gingiva, no swelling of the floor the mouth. The pharynx is clear. Neck:  No swelling or adenopathy. Lungs:  Breath sounds clear and equal bilaterally.  No wheezes, rales or rhonchi. Heart:  Regular rhythm.  No gallops or murmers. Skin:  Clear, warm and dry.  Assessment:  The encounter diagnosis was Dental abscess. I cannot see anything externally, but she may have an abscess down at the root of the tooth.  Plan:   1.  The following meds were prescribed:   New Prescriptions   CLINDAMYCIN (CLEOCIN) 300 MG CAPSULE    Take 1 capsule (300 mg total) by mouth 4 (four)  times daily.   2.  The patient was instructed in symptomatic care and handouts were given. 3.  The patient was told to return if becoming worse in any way, if no better in 3 or 4 days, and given some red flag symptoms that would indicate earlier return, especially difficulty breathing. 4.  The patient was told to follow up with a dentist as soon as possible.    Reuben Likes, MD 05/29/12 [phone]

## 2012-05-29 NOTE — ED Notes (Signed)
HERE WITH C/O EARLY ABSCESS FORMING TO LEFT SIDE OF MOUTH R/T TOOTHACHE.SWELLING JUST STARTED YESTERDAY BUT ACHE WAS Sunday.5/10 PAIN.NO DRAINAGE OR FEVERS REPORTED
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
## 2012-07-09 ENCOUNTER — Other Ambulatory Visit: Payer: Self-pay | Admitting: *Deleted

## 2012-07-09 DIAGNOSIS — I10 Essential (primary) hypertension: Secondary | ICD-10-CM

## 2012-07-09 MED ORDER — LISINOPRIL-HYDROCHLOROTHIAZIDE 20-25 MG PO TABS: 1.0000 | ORAL_TABLET | Freq: Every day | ORAL | Status: DC

## 2012-07-11 ENCOUNTER — Emergency Department (INDEPENDENT_AMBULATORY_CARE_PROVIDER_SITE_OTHER)
Admission: EM | Admit: 2012-07-11 | Discharge: 2012-07-11 | Disposition: A | Discharge status: Home / Self Care | Payer: Medicare Other | Source: Home / Self Care

## 2012-07-11 ENCOUNTER — Encounter (HOSPITAL_COMMUNITY): Payer: Self-pay

## 2012-07-11 DIAGNOSIS — M25569 Pain in unspecified knee: Secondary | ICD-10-CM

## 2012-07-11 DIAGNOSIS — M25562 Pain in left knee: Secondary | ICD-10-CM

## 2012-07-11 MED ORDER — HYDROCODONE-ACETAMINOPHEN 5-325 MG PO TABS: 2.0000 | ORAL_TABLET | ORAL | Status: AC | PRN

## 2012-07-11 MED ORDER — MELOXICAM 7.5 MG PO TABS: 7.5000 mg | ORAL_TABLET | Freq: Every day | ORAL | Status: DC

## 2012-07-11 NOTE — ED Provider Notes (Signed)
History     CSN: [id number]  Arrival date & time 07/11/12  1347   None     Chief Complaint  Patient presents with  . Leg Pain    (Consider location/radiation/quality/duration/timing/severity/associated sxs/prior treatment) Patient is a 51 y.o. female presenting with leg pain. The history is provided by the patient. No language interpreter was used.  Leg Pain  Incident onset: 2 weeks. The incident occurred at home. There was no injury mechanism. The pain is present in the left knee. The quality of the pain is described as aching. The pain is at a severity of 5/10. The pain has been constant since onset. Pertinent negatives include no numbness and no inability to bear weight. She reports no foreign bodies present. She has tried nothing for the symptoms.    Past Medical History  Diagnosis Date  . HIV disease     Past Surgical History  Procedure Date  . Total abdominal hysterectomy     History reviewed. No pertinent family history.  History  Substance Use Topics  . Smoking status: Current Some Day Smoker -- 0.1 packs/day for 40 years    Types: Cigarettes  . Smokeless tobacco: Never Used   Comment: down to one cig/day  . Alcohol Use: No    OB History    Grav Para Term Preterm Abortions TAB SAB Ect Mult Living                  Review of Systems  Musculoskeletal: Positive for myalgias, joint swelling and gait problem.  Neurological: Negative for numbness.  All other systems reviewed and are negative.    Allergies  Cephalexin  Home Medications   Current Outpatient Rx  Name Route Sig Dispense Refill  . EFAVIRENZ-EMTRICITAB-TENOFOVIR 600-200-300 MG PO TABS Oral Take 1 tablet by mouth daily. 30 tablet 11  . LISINOPRIL-HYDROCHLOROTHIAZIDE 20-25 MG PO TABS Oral Take 1 tablet by mouth daily. 30 tablet 6  . FISH OIL 300 MG PO CAPS Oral Take 3 capsules (900 mg total) by mouth daily. 90 capsule 11  . TRAMADOL HCL 50 MG PO TABS Oral Take 1 tablet (50 mg total) by mouth  every 8 (eight) hours as needed for pain. Maximum dose= 8 tablets per day 60 tablet 4    BP 131/90  Pulse 80  Temp 98 F (36.7 C) (Oral)  Resp 20  SpO2 97%  Physical Exam  Nursing note and vitals reviewed. Constitutional: She is oriented to person, place, and time. She appears well-developed and well-nourished.  HENT:  Head: Normocephalic.  Musculoskeletal: She exhibits tenderness.       Tender left knee, decreased range of motion,  nv and ns intact  Neurological: She is alert and oriented to person, place, and time.  Skin: Skin is warm and dry.  Psychiatric: She has a normal mood and affect.    ED Course  Procedures (including critical care time)  Labs Reviewed - No data to display No results found.   1. Knee pain, left       MDM  Pt given rx for meloxicam and hydrocodone.   Pt advised to call Dr. Wadie Lessen office for followup        Elson Areas, PA 07/11/12 1709  Lonia Skinner [place], [place] 07/11/12 [phone]

## 2012-07-11 NOTE — ED Notes (Signed)
States she is having pain in her left thigh for past 2 weeks; post op knee surgery on 5-17

## 2012-07-11 NOTE — ED Provider Notes (Signed)
Medical screening examination/treatment/procedure(s) were performed by non-physician practitioner and as supervising physician I was immediately available for consultation/collaboration.  Leslee Home, M.D.   Reuben Likes, MD 07/11/12 [phone]

## 2012-08-12 ENCOUNTER — Other Ambulatory Visit: Payer: Medicare Other

## 2012-08-12 ENCOUNTER — Ambulatory Visit (INDEPENDENT_AMBULATORY_CARE_PROVIDER_SITE_OTHER): Payer: Medicare Other

## 2012-08-12 DIAGNOSIS — B20 Human immunodeficiency virus [HIV] disease: Secondary | ICD-10-CM

## 2012-08-12 DIAGNOSIS — Z23 Encounter for immunization: Secondary | ICD-10-CM

## 2012-08-13 LAB — COMPREHENSIVE METABOLIC PANEL
ALT: 24 U/L (ref 0–35)
Albumin: 4.2 g/dL (ref 3.5–5.2)
BUN: 11 mg/dL (ref 6–23)
CO2: 26 mEq/L (ref 19–32)
Calcium: 9.7 mg/dL (ref 8.4–10.5)
Chloride: 101 mEq/L (ref 96–112)
Creat: 0.63 mg/dL (ref 0.50–1.10)
Potassium: 3.8 mEq/L (ref 3.5–5.3)

## 2012-08-13 LAB — CBC
HCT: 44.2 % (ref 36.0–46.0)
MCV: 89.7 fL (ref 78.0–100.0)
RBC: 4.93 MIL/uL (ref 3.87–5.11)
WBC: 9 10*3/uL (ref 4.0–10.5)

## 2012-08-13 LAB — T-HELPER CELL (CD4) - (RCID CLINIC ONLY): CD4 T Cell Abs: 1620 uL (ref 400–2700)

## 2012-09-02 ENCOUNTER — Encounter: Payer: Self-pay | Admitting: Internal Medicine

## 2012-09-02 ENCOUNTER — Telehealth: Payer: Self-pay | Admitting: *Deleted

## 2012-09-02 ENCOUNTER — Ambulatory Visit (INDEPENDENT_AMBULATORY_CARE_PROVIDER_SITE_OTHER): Payer: Medicare Other | Admitting: Internal Medicine

## 2012-09-02 VITALS — BP 123/77 | HR 69 | Temp 97.6°F | Wt 241.0 lb

## 2012-09-02 DIAGNOSIS — Z79899 Other long term (current) drug therapy: Secondary | ICD-10-CM

## 2012-09-02 DIAGNOSIS — B20 Human immunodeficiency virus [HIV] disease: Secondary | ICD-10-CM
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
## 2012-09-02 NOTE — Telephone Encounter (Signed)
Called the Internal Medicine Clinic at [facility] to try and get the patient reestablished with a PCP, but due to the amount of time since she was last seen there she will have to go in as a new patient. Information given and advised they will call the patient directly soon but gave my name as clinic contact just in case they can not reach the patient.

## 2012-09-05 ENCOUNTER — Telehealth: Payer: Self-pay | Admitting: *Deleted

## 2012-09-05 NOTE — Telephone Encounter (Signed)
Called patient to give her the information for her appt with Internal Medicine 09/16/12 at 1015 am.

## 2012-09-16 ENCOUNTER — Encounter: Payer: Self-pay | Admitting: Internal Medicine

## 2012-09-16 ENCOUNTER — Ambulatory Visit (INDEPENDENT_AMBULATORY_CARE_PROVIDER_SITE_OTHER): Payer: Medicare Other | Admitting: Internal Medicine

## 2012-09-16 VITALS — BP 114/82 | HR 74 | Temp 97.0°F | Ht 66.0 in | Wt 240.4 lb

## 2012-09-16 DIAGNOSIS — I10 Essential (primary) hypertension: Secondary | ICD-10-CM

## 2012-09-16 DIAGNOSIS — Z8639 Personal history of other endocrine, nutritional and metabolic disease: Secondary | ICD-10-CM | POA: Insufficient documentation

## 2012-09-16 DIAGNOSIS — Z862 Personal history of diseases of the blood and blood-forming organs and certain disorders involving the immune mechanism: Secondary | ICD-10-CM

## 2012-09-16 DIAGNOSIS — Z0000 Encounter for general adult medical examination without abnormal findings: Secondary | ICD-10-CM | POA: Insufficient documentation

## 2012-09-16 LAB — TSH: TSH: 0.637 u[IU]/mL (ref 0.350–4.500)

## 2012-09-16 NOTE — Assessment & Plan Note (Addendum)
Vaccinations are up to date: HAV immune, HBV vaccination complete in 2002, influenza administered 08/12/2012, pneumococcal administered 2012, and she reports tetanus vaccine by Dr. Orvan Falconer in 2008.  Cancer screening: colonoscopy in 2010 with polypectomy and 5 year follow-up recommended, mammogram June 2012 was BIRADS-1, and pap smear not needed following TAH.  She does not require routine screening for diabetes and her random blood glucose levels in the past are not concerning.

## 2012-09-16 NOTE — Assessment & Plan Note (Signed)
Her blood pressure is well controlled (114/82 today) on lisinopril-HCTZ 20-25mg .  No changes needed. - continue lisinopril-HCTZ 20-25mg  daily  Filed Vitals:   09/16/12 1013  BP: 114/82  Pulse: 74  Temp: 97 F (36.1 C)

## 2012-09-16 NOTE — Assessment & Plan Note (Addendum)
She has taken levothyroxine in the past, but she says Dr. Orvan Falconer discontinued this because she did not need it anymore.  Now she thinks she needs it again because of weight gain.  Her last TSH was measured in 2009 and it was normal at 0.390 then.  We will recheck her TSH level today. - TSH today   TSH is 0.637 and normal
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
## 2012-09-16 NOTE — Patient Instructions (Signed)
We will check your thyroid function today and call you if it is abnormal.

## 2012-09-17 ENCOUNTER — Encounter: Payer: Self-pay | Admitting: Internal Medicine

## 2012-11-25 ENCOUNTER — Other Ambulatory Visit: Payer: Self-pay | Admitting: Licensed Clinical Social Worker

## 2012-11-25 DIAGNOSIS — B20 Human immunodeficiency virus [HIV] disease: Secondary | ICD-10-CM

## 2012-11-25 MED ORDER — EFAVIRENZ-EMTRICITAB-TENOFOVIR 600-200-300 MG PO TABS: 1.0000 | ORAL_TABLET | Freq: Every day | ORAL | Status: DC

## 2013-01-19 ENCOUNTER — Encounter (HOSPITAL_BASED_OUTPATIENT_CLINIC_OR_DEPARTMENT_OTHER): Payer: Self-pay | Admitting: *Deleted

## 2013-01-19 NOTE — Progress Notes (Signed)
To come in for [id number]-ekg HIV pt sees Dr Orvan Falconer and [facility]- Denies any ht or resp problems To bring her walker

## 2013-01-20 ENCOUNTER — Encounter (HOSPITAL_BASED_OUTPATIENT_CLINIC_OR_DEPARTMENT_OTHER)
Admission: RE | Admit: 2013-01-20 | Discharge: 2013-01-20 | Disposition: A | Discharge status: Home / Self Care | Payer: Medicare Other | Source: Ambulatory Visit | Attending: Orthopedic Surgery | Admitting: Orthopedic Surgery

## 2013-01-20 LAB — BASIC METABOLIC PANEL
Calcium: 9.2 mg/dL (ref 8.4–10.5)
Creatinine, Ser: 0.54 mg/dL (ref 0.50–1.10)
GFR calc non Af Amer: >90 mL/min (ref >90)
Glucose, Bld: 102 mg/dL — ABNORMAL HIGH (ref 70–99)
Sodium: 141 mEq/L (ref 135–145)
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
## 2013-01-23 ENCOUNTER — Encounter (HOSPITAL_BASED_OUTPATIENT_CLINIC_OR_DEPARTMENT_OTHER): Payer: Self-pay | Admitting: *Deleted

## 2013-01-26 ENCOUNTER — Encounter: Payer: Self-pay | Admitting: Internal Medicine

## 2013-01-26 ENCOUNTER — Ambulatory Visit (HOSPITAL_BASED_OUTPATIENT_CLINIC_OR_DEPARTMENT_OTHER): Payer: Medicare Other | Admitting: Anesthesiology

## 2013-01-26 ENCOUNTER — Ambulatory Visit (HOSPITAL_BASED_OUTPATIENT_CLINIC_OR_DEPARTMENT_OTHER)
Admission: RE | Admit: 2013-01-26 | Discharge: 2013-01-26 | Disposition: A | Discharge status: Home / Self Care | Payer: Medicare Other | Source: Ambulatory Visit | Attending: Orthopedic Surgery | Admitting: Orthopedic Surgery

## 2013-01-26 ENCOUNTER — Encounter (HOSPITAL_BASED_OUTPATIENT_CLINIC_OR_DEPARTMENT_OTHER): Payer: Self-pay | Admitting: Anesthesiology

## 2013-01-26 ENCOUNTER — Encounter (HOSPITAL_BASED_OUTPATIENT_CLINIC_OR_DEPARTMENT_OTHER)
Admission: RE | Disposition: A | Discharge status: Home / Self Care | Payer: Self-pay | Source: Ambulatory Visit | Attending: Orthopedic Surgery

## 2013-01-26 DIAGNOSIS — B20 Human immunodeficiency virus [HIV] disease: Secondary | ICD-10-CM | POA: Insufficient documentation

## 2013-01-26 DIAGNOSIS — Z96659 Presence of unspecified artificial knee joint: Secondary | ICD-10-CM | POA: Insufficient documentation

## 2013-01-26 DIAGNOSIS — M2240 Chondromalacia patellae, unspecified knee: Secondary | ICD-10-CM | POA: Insufficient documentation

## 2013-01-26 DIAGNOSIS — J449 Chronic obstructive pulmonary disease, unspecified: Secondary | ICD-10-CM | POA: Insufficient documentation

## 2013-01-26 DIAGNOSIS — I10 Essential (primary) hypertension: Secondary | ICD-10-CM | POA: Insufficient documentation

## 2013-01-26 DIAGNOSIS — B1920 Unspecified viral hepatitis C without hepatic coma: Secondary | ICD-10-CM | POA: Insufficient documentation

## 2013-01-26 DIAGNOSIS — Z6837 Body mass index (BMI) 37.0-37.9, adult: Secondary | ICD-10-CM | POA: Insufficient documentation

## 2013-01-26 DIAGNOSIS — J4489 Other specified chronic obstructive pulmonary disease: Secondary | ICD-10-CM | POA: Insufficient documentation

## 2013-01-26 DIAGNOSIS — F17200 Nicotine dependence, unspecified, uncomplicated: Secondary | ICD-10-CM | POA: Insufficient documentation

## 2013-01-26 HISTORY — DX: Chronic viral hepatitis C: B18.2

## 2013-01-26 HISTORY — DX: Allergic rhinitis, unspecified: J30.9

## 2013-01-26 HISTORY — DX: Psoriasis, unspecified: L40.9

## 2013-01-26 HISTORY — DX: Excessive and frequent menstruation with regular cycle: N92.0

## 2013-01-26 HISTORY — DX: Human immunodeficiency virus (HIV) disease: B20

## 2013-01-26 HISTORY — DX: Essential (primary) hypertension: I10

## 2013-01-26 HISTORY — DX: Unspecified injury of head, initial encounter: S09.90XA

## 2013-01-26 HISTORY — DX: Unspecified osteoarthritis, unspecified site: M19.90

## 2013-01-26 HISTORY — PX: KNEE ARTHROSCOPY: SUR90

## 2013-01-26 HISTORY — DX: Weakness: R53.1

## 2013-01-26 HISTORY — DX: Pyogenic arthritis, unspecified: M00.9

## 2013-01-26 HISTORY — PX: KNEE ARTHROSCOPY: SHX127

## 2013-01-26 SURGERY — ARTHROSCOPY, KNEE
Anesthesia: General | Site: Knee | Laterality: Left | Wound class: Clean

## 2013-01-26 MED ORDER — SODIUM CHLORIDE 0.9 % IR SOLN
Status: DC | PRN
  Administered 2013-01-26: 11:00:00

## 2013-01-26 MED ORDER — VANCOMYCIN HCL 1000 MG IV SOLR
1000.0000 mg | INTRAVENOUS | Status: DC | PRN
  Administered 2013-01-26: 1000 mg via INTRAVENOUS

## 2013-01-26 MED ORDER — OXYCODONE HCL 5 MG/5ML PO SOLN: 5.0000 mg | Freq: Once | ORAL | Status: DC | PRN

## 2013-01-26 MED ORDER — HYDROCODONE-ACETAMINOPHEN 10-325 MG PO TABS: 1.0000 | ORAL_TABLET | ORAL | Status: DC | PRN

## 2013-01-26 MED ORDER — DEXAMETHASONE SODIUM PHOSPHATE 4 MG/ML IJ SOLN
INTRAMUSCULAR | Status: DC | PRN
  Administered 2013-01-26: 10 mg via INTRAVENOUS

## 2013-01-26 MED ORDER — BUPIVACAINE-EPINEPHRINE 0.5% -1:200000 IJ SOLN
INTRAMUSCULAR | Status: DC | PRN
  Administered 2013-01-26: 20 mL

## 2013-01-26 MED ORDER — ONDANSETRON HCL 4 MG/2ML IJ SOLN
INTRAMUSCULAR | Status: DC | PRN
  Administered 2013-01-26: 4 mg via INTRAVENOUS

## 2013-01-26 MED ORDER — LACTATED RINGERS IV SOLN
INTRAVENOUS | Status: DC
  Administered 2013-01-26: 11:00:00 via INTRAVENOUS

## 2013-01-26 MED ORDER — PROMETHAZINE HCL 25 MG/ML IJ SOLN: 6.2500 mg | INTRAMUSCULAR | Status: DC | PRN

## 2013-01-26 MED ORDER — OXYCODONE HCL 5 MG PO TABS: 5.0000 mg | ORAL_TABLET | Freq: Once | ORAL | Status: DC | PRN

## 2013-01-26 MED ORDER — LIDOCAINE HCL (CARDIAC) 20 MG/ML IV SOLN
INTRAVENOUS | Status: DC | PRN
  Administered 2013-01-26: 50 mg via INTRAVENOUS

## 2013-01-26 MED ORDER — FENTANYL CITRATE 0.05 MG/ML IJ SOLN
INTRAMUSCULAR | Status: DC | PRN
  Administered 2013-01-26: 100 ug via INTRAVENOUS

## 2013-01-26 MED ORDER — FENTANYL CITRATE 0.05 MG/ML IJ SOLN: INTRAMUSCULAR | Status: DC | PRN

## 2013-01-26 MED ORDER — FENTANYL CITRATE 0.05 MG/ML IJ SOLN
25.0000 ug | INTRAMUSCULAR | Status: DC | PRN
  Administered 2013-01-26 (×2): 50 ug via INTRAVENOUS

## 2013-01-26 MED ORDER — MEPERIDINE HCL 25 MG/ML IJ SOLN: 6.2500 mg | INTRAMUSCULAR | Status: DC | PRN

## 2013-01-26 MED ORDER — MIDAZOLAM HCL 2 MG/2ML IJ SOLN: 0.5000 mg | Freq: Once | INTRAMUSCULAR | Status: DC | PRN

## 2013-01-26 MED ORDER — PROPOFOL 10 MG/ML IV BOLUS
INTRAVENOUS | Status: DC | PRN
  Administered 2013-01-26: 200 mg via INTRAVENOUS

## 2013-01-26 SURGICAL SUPPLY — 39 items
BANDAGE ELASTIC 6 VELCRO ST LF (GAUZE/BANDAGES/DRESSINGS) ×2 IMPLANT
BLADE 4.2CUDA (BLADE) IMPLANT
BLADE CUTTER GATOR 3.5 (BLADE) ×2 IMPLANT
BLADE GREAT WHITE 4.2 (BLADE) IMPLANT
CANISTER OMNI JUG 16 LITER (MISCELLANEOUS) IMPLANT
CANISTER SUCTION 2500CC (MISCELLANEOUS) IMPLANT
CHLORAPREP W/TINT 26ML (MISCELLANEOUS) ×2 IMPLANT
CLOTH BEACON ORANGE TIMEOUT ST (SAFETY) ×2 IMPLANT
DRAPE ARTHROSCOPY W/POUCH 114 (DRAPES) ×2 IMPLANT
ELECT MENISCUS 165MM 90D (ELECTRODE) IMPLANT
ELECT REM PT RETURN 9FT ADLT (ELECTROSURGICAL)
ELECTRODE REM PT RTRN 9FT ADLT (ELECTROSURGICAL) IMPLANT
GAUZE XEROFORM 1X8 LF (GAUZE/BANDAGES/DRESSINGS) ×2 IMPLANT
GLOVE BIO SURGEON STRL SZ 6.5 (GLOVE) ×2 IMPLANT
GLOVE BIO SURGEON STRL SZ7 (GLOVE) ×2 IMPLANT
GLOVE BIO SURGEON STRL SZ7.5 (GLOVE) ×2 IMPLANT
GLOVE BIOGEL PI IND STRL 7.0 (GLOVE) ×2 IMPLANT
GLOVE BIOGEL PI IND STRL 8 (GLOVE) ×1 IMPLANT
GLOVE BIOGEL PI INDICATOR 7.0 (GLOVE) ×2
GLOVE BIOGEL PI INDICATOR 8 (GLOVE) ×1
GLOVE EXAM NITRILE EXT CUFF MD (GLOVE) ×2 IMPLANT
GOWN PREVENTION PLUS XLARGE (GOWN DISPOSABLE) ×2 IMPLANT
GOWN PREVENTION PLUS XXLARGE (GOWN DISPOSABLE) ×2 IMPLANT
IV NS IRRIG 3000ML ARTHROMATIC (IV SOLUTION) IMPLANT
KNEE WRAP E Z 3 GEL PACK (MISCELLANEOUS) ×2 IMPLANT
NDL SAFETY ECLIPSE 18X1.5 (NEEDLE) ×1 IMPLANT
NEEDLE HYPO 18GX1.5 SHARP (NEEDLE) ×1
PACK ARTHROSCOPY DSU (CUSTOM PROCEDURE TRAY) ×2 IMPLANT
PACK BASIN DAY SURGERY FS (CUSTOM PROCEDURE TRAY) ×2 IMPLANT
PENCIL BUTTON HOLSTER BLD 10FT (ELECTRODE) IMPLANT
SET ARTHROSCOPY TUBING (MISCELLANEOUS) ×1
SET ARTHROSCOPY TUBING LN (MISCELLANEOUS) ×1 IMPLANT
SLEEVE SCD COMPRESS KNEE MED (MISCELLANEOUS) IMPLANT
SPONGE GAUZE 4X4 12PLY (GAUZE/BANDAGES/DRESSINGS) ×2 IMPLANT
SYR 3ML 18GX1 1/2 (SYRINGE) IMPLANT
SYR 5ML LL (SYRINGE) IMPLANT
TOWEL OR 17X24 6PK STRL BLUE (TOWEL DISPOSABLE) ×2 IMPLANT
WAND STAR VAC 90 (SURGICAL WAND) IMPLANT
WATER STERILE IRR 1000ML POUR (IV SOLUTION) ×2 IMPLANT
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
## 2013-01-26 NOTE — Op Note (Signed)
Pre-Op Dx: Left knee chondromalacia  Postop Dx: Left knee chondromalacia trochlea grade 3 patella grade 3 focal grade 4   Procedure: Left knee arthroscopic debridement of chondromalacia  Surgeon: Feliberto Gottron. Turner Daniels M.D.  Assist: Shirl Harris PA-C  Anes: General LMA  EBL: Minimal  Fluids: 800 cc   Indications: Recurrent catching popping and pain in the left knee with occasional small effusions. Same symptoms that she had the last time her knee was scoped in 2013. Patient does not want any cortisone injections or Visco supplementation. Her contralateral right total knee is doing well. Pt has failed conservative treatment with anti-inflammatory medicines, physical therapy, and modified activites but did get good temporarily from an intra-articular cortisone injection. Pain has recurred and patient desires elective arthroscopic evaluation and treatment of knee. Risks and benefits of surgery have been discussed and questions answered.  Procedure: Patient identified by arm band and taken to the operating room at the day surgery Center. The appropriate anesthetic monitors were attached, and General LMA anesthesia was induced without difficulty. Lateral post was applied to the table and the lower extremity was prepped and draped in usual sterile fashion from the ankle to the midthigh. Time out procedure was performed. We began the operation by making standard inferior lateral and inferior medial peripatellar portals with a #11 blade allowing introduction of the arthroscope through the inferior lateral portal and the out flow to the inferior medial portal. Pump pressure was set at 100 mmHg and diagnostic arthroscopy  revealed chondromalacia of the patella apex and lateral facet grade 3 focal grade 4 with flap tears were debrided, grade 3 to the trochlea likewise debrided flap tears. Medial and lateral compartments were in good condition minor chondromalacia no debridement. Anterior cruciate ligament and PCL  were in excellent condition. The gutters cleared medially and laterally, small cartilaginous loose bodies in joint fluid removed throughout flow menisci intact.. The knee was irrigated out normal saline solution. A dressing of xerofoam 4 x 4 dressing sponges, web roll and an Ace wrap was applied. The patient was awakened extubated and taken to the recovery without difficulty.    Signed: [name], MD

## 2013-01-26 NOTE — Anesthesia Postprocedure Evaluation (Signed)
  Anesthesia Post-op Note  Patient: Nicole [name]  Procedure(s) Performed: Procedure(s): ARTHROSCOPY LEFT KNEE WITH CHCONDROPLASTY (Left)  Patient Location: PACU  Anesthesia Type:General  Level of Consciousness: awake, alert , oriented and patient cooperative  Airway and Oxygen Therapy: Patient Spontanous Breathing  Post-op Pain: none  Post-op Assessment: Post-op Vital signs reviewed, Patient's Cardiovascular Status Stable, Respiratory Function Stable, Patent Airway, No signs of Nausea or vomiting and Pain level controlled  Post-op Vital Signs: Reviewed and stable  Complications: No apparent anesthesia complications

## 2013-01-26 NOTE — Transfer of Care (Signed)
Immediate Anesthesia Transfer of Care Note  Patient: Nicole [name]  Procedure(s) Performed: Procedure(s): ARTHROSCOPY LEFT KNEE WITH CHCONDROPLASTY (Left)  Patient Location: PACU  Anesthesia Type:General  Level of Consciousness: sedated  Airway & Oxygen Therapy: Patient Spontanous Breathing and Patient connected to face mask oxygen  Post-op Assessment: Report given to PACU RN and Post -op Vital signs reviewed and stable  Post vital signs: Reviewed and stable  Complications: No apparent anesthesia complications

## 2013-01-26 NOTE — Interval H&P Note (Signed)
History and Physical Interval Note:  01/26/2013 10:34 AM  Nicole [name]  has presented today for surgery, with the diagnosis of left knee chondromalasia  The various methods of treatment have been discussed with the patient and family. After consideration of risks, benefits and other options for treatment, the patient has consented to  Procedure(s): ARTHROSCOPY LEFT KNEE (Left) as a surgical intervention .  The patient's history has been reviewed, patient examined, no change in status, stable for surgery.  I have reviewed the patient's chart and labs.  Questions were answered to the patient's satisfaction.     [name]

## 2013-01-26 NOTE — Anesthesia Procedure Notes (Signed)
Procedure Name: LMA Insertion Date/Time: 01/26/2013 10:58 AM Performed by: Caren Macadam Pre-anesthesia Checklist: Patient identified, Emergency Drugs available, Suction available and Patient being monitored Patient Re-evaluated:Patient Re-evaluated prior to inductionOxygen Delivery Method: Circle System Utilized Preoxygenation: Pre-oxygenation with 100% oxygen Intubation Type: IV induction Ventilation: Mask ventilation without difficulty LMA: LMA inserted LMA Size: 4.0 Number of attempts: 1 Airway Equipment and Method: bite block Placement Confirmation: positive ETCO2 Tube secured with: Tape Dental Injury: Teeth and Oropharynx as per pre-operative assessment

## 2013-01-27 ENCOUNTER — Encounter (HOSPITAL_BASED_OUTPATIENT_CLINIC_OR_DEPARTMENT_OTHER): Payer: Self-pay | Admitting: Orthopedic Surgery

## 2013-02-09 ENCOUNTER — Other Ambulatory Visit: Payer: Self-pay | Admitting: *Deleted

## 2013-02-09 DIAGNOSIS — I10 Essential (primary) hypertension: Secondary | ICD-10-CM

## 2013-02-09 MED ORDER — LISINOPRIL-HYDROCHLOROTHIAZIDE 20-25 MG PO TABS: 1.0000 | ORAL_TABLET | Freq: Every day | ORAL | Status: DC

## 2013-02-11 ENCOUNTER — Other Ambulatory Visit: Payer: Self-pay | Admitting: Internal Medicine

## 2013-02-12 ENCOUNTER — Other Ambulatory Visit: Payer: Self-pay | Admitting: *Deleted

## 2013-02-12 ENCOUNTER — Other Ambulatory Visit: Payer: Self-pay | Admitting: Licensed Clinical Social Worker

## 2013-02-12 DIAGNOSIS — I10 Essential (primary) hypertension: Secondary | ICD-10-CM

## 2013-02-12 MED ORDER — LISINOPRIL-HYDROCHLOROTHIAZIDE 20-25 MG PO TABS: 1.0000 | ORAL_TABLET | Freq: Every day | ORAL | Status: DC

## 2013-02-12 NOTE — Telephone Encounter (Signed)
Error

## 2013-02-24 ENCOUNTER — Other Ambulatory Visit: Payer: Medicare Other

## 2013-02-24 DIAGNOSIS — B20 Human immunodeficiency virus [HIV] disease: Secondary | ICD-10-CM

## 2013-02-24 DIAGNOSIS — Z79899 Other long term (current) drug therapy: Secondary | ICD-10-CM

## 2013-02-24 LAB — COMPREHENSIVE METABOLIC PANEL
CO2: 28 mEq/L (ref 19–32)
Creat: 0.72 mg/dL (ref 0.50–1.10)
Glucose, Bld: 97 mg/dL (ref 70–99)
Sodium: 139 mEq/L (ref 135–145)
Total Bilirubin: 0.4 mg/dL (ref 0.3–1.2)
Total Protein: 7.4 g/dL (ref 6.0–8.3)

## 2013-02-24 LAB — RPR

## 2013-02-24 LAB — CBC
MCH: 30.6 pg (ref 26.0–34.0)
MCHC: 35.2 g/dL (ref 30.0–36.0)
RDW: 14 % (ref 11.5–15.5)
WBC: 6.4 10*3/uL (ref 4.0–10.5)

## 2013-02-24 LAB — LIPID PANEL
HDL: 56 mg/dL (ref >39)
Triglycerides: 97 mg/dL (ref <150)

## 2013-02-25 LAB — HIV-1 RNA QUANT-NO REFLEX-BLD
HIV 1 RNA Quant: <20 {copies}/mL
HIV-1 RNA Quant, Log: <1.3 {Log}

## 2013-02-25 LAB — T-HELPER CELL (CD4) - (RCID CLINIC ONLY): CD4 T Cell Abs: 1150 uL (ref 400–2700)

## 2013-03-10 ENCOUNTER — Encounter: Payer: Self-pay | Admitting: Internal Medicine

## 2013-03-10 ENCOUNTER — Ambulatory Visit (INDEPENDENT_AMBULATORY_CARE_PROVIDER_SITE_OTHER): Payer: Medicare Other | Admitting: Internal Medicine

## 2013-03-10 VITALS — BP 119/79 | HR 80 | Temp 97.6°F | Ht 66.0 in | Wt 232.5 lb

## 2013-03-10 DIAGNOSIS — B20 Human immunodeficiency virus [HIV] disease: Secondary | ICD-10-CM

## 2013-03-10 NOTE — Progress Notes (Signed)
Patient ID: Nicole [name], female   DOB: 01-30-1961, 52 y.o.   MRN: [id number]          [facility] for Infectious Disease  Patient Active Problem List   Diagnosis Date Noted  . Obesity, Class II, BMI 35-39.9 11/30/2008    Priority: High  . Smokes tobacco daily 02/24/2007    Priority: High  . Essential hypertension, benign 02/24/2007    Priority: High  . HIV disease 11/12/2000    Priority: High  . Chronic hepatitis C 11/12/2000    Priority: High  . Weakness of left side of body 12/18/2007  . Allergic rhinitis, cause unspecified 02/24/2007  . Psoriasis 02/24/2007  . Osteoarthritis of left knee 02/24/2007    Patient's Medications  New Prescriptions   No medications on file  Previous Medications   EFAVIRENZ-EMTRICITABINE-TENOFOVIR (ATRIPLA) 600-200-300 MG PER TABLET    Take 1 tablet by mouth daily.   HYDROCODONE-ACETAMINOPHEN (NORCO) 10-325 MG PER TABLET    Take 1 tablet by mouth every 4 (four) hours as needed for pain.   LISINOPRIL-HYDROCHLOROTHIAZIDE (PRINZIDE,ZESTORETIC) 20-25 MG PER TABLET    Take 1 tablet by mouth daily.   OMEGA-3 FATTY ACIDS (FISH OIL) 300 MG CAPS    Take 3 capsules (900 mg total) by mouth daily.  Modified Medications   No medications on file  Discontinued Medications   HYDROCODONE-ACETAMINOPHEN (LORTAB) 10-500 MG PER TABLET       METHOCARBAMOL (ROBAXIN) 500 MG TABLET    Take 500 mg by mouth 2 (two) times daily.    Subjective: Nicole [name] is in for her routine visit. As usual she never misses a single dose of her Atripla. She returned to [facility] recently to visit the hepatitis C clinic and was told that she probably would start a new oral hepatitis C regimen at the time of her next visit. She recently had arthroscopic knee surgery and is feeling much better. She is down to one cigarette daily but has not set a quit date.  Objective: Temp: 97.6 F (36.4 C) (04/29 0939) Temp src: Oral (04/29 0939) BP: 119/79 mmHg (04/29 0939) Pulse Rate: 80  (04/29 0939)  General: He is walking with the assistance of a cane but has little difficulty ambulating Skin: No rash Lungs: Clear Cor: Regular S1 and S2 no murmurs Abdomen: Obese, soft nontender  Lab Results HIV 1 RNA Quant (copies/mL)  Date Value  02/24/2013 <20   08/12/2012 90*  04/22/2012 <20      CD4 T Cell Abs (cmm)  Date Value  02/24/2013 1150   08/12/2012 1620   04/22/2012 1210      Assessment: Her HIV infection remains under excellent control. I will continue Atripla.  I told her that the newer regimens for hepatitis C are generally much easier to tolerate and have much higher rates of cure than older regimens. I asked her call me if she has any questions about the treatments after her next visit to the hepatitis C clinic.  I encouraged her to set a quit date and try to stop smoking cigarettes completely.  Plan: 1. Continue Atripla 2. Followup after blood work in 6 months   Cliffton Asters, MD [facility] for Infectious Disease [facility] [phone] pager   [phone] cell 03/10/2013, 10:01 AM

## 2013-03-11 ENCOUNTER — Telehealth: Payer: Self-pay | Admitting: *Deleted

## 2013-03-11 NOTE — Telephone Encounter (Signed)
Patient called and left a message for the office to call her back. She advised she has a broken tooth and it is bothering her and she wants to be put in touch with the dental clinic. Tried to call her back and was not able to get her, not able to leave a message. Will advise her that I have given her name to the dental scheduler and someone will be calling her soon.

## 2013-08-03 ENCOUNTER — Ambulatory Visit (INDEPENDENT_AMBULATORY_CARE_PROVIDER_SITE_OTHER): Payer: Medicare Other | Admitting: Internal Medicine

## 2013-08-03 ENCOUNTER — Encounter: Payer: Self-pay | Admitting: Internal Medicine

## 2013-08-03 VITALS — BP 133/80 | HR 73 | Temp 98.1°F | Ht 66.0 in | Wt 244.1 lb

## 2013-08-03 DIAGNOSIS — Z23 Encounter for immunization: Secondary | ICD-10-CM

## 2013-08-03 DIAGNOSIS — Z0000 Encounter for general adult medical examination without abnormal findings: Secondary | ICD-10-CM

## 2013-08-03 DIAGNOSIS — F17200 Nicotine dependence, unspecified, uncomplicated: Secondary | ICD-10-CM

## 2013-08-03 DIAGNOSIS — R059 Cough, unspecified: Secondary | ICD-10-CM

## 2013-08-03 DIAGNOSIS — I10 Essential (primary) hypertension: Secondary | ICD-10-CM

## 2013-08-03 DIAGNOSIS — R05 Cough: Secondary | ICD-10-CM | POA: Insufficient documentation

## 2013-08-03 DIAGNOSIS — R053 Chronic cough: Secondary | ICD-10-CM | POA: Insufficient documentation

## 2013-08-03 LAB — CBC WITH DIFFERENTIAL/PLATELET
Basophils Absolute: 0 10*3/uL (ref 0.0–0.1)
Basophils Relative: 1 % (ref 0–1)
Eosinophils Absolute: 0.2 10*3/uL (ref 0.0–0.7)
Lymphs Abs: 3 10*3/uL (ref 0.7–4.0)
MCH: 31.4 pg (ref 26.0–34.0)
Neutrophils Relative %: 47 % (ref 43–77)
Platelets: 250 10*3/uL (ref 150–400)
RBC: 4.71 MIL/uL (ref 3.87–5.11)
RDW: 14.2 % (ref 11.5–15.5)

## 2013-08-03 LAB — BASIC METABOLIC PANEL WITH GFR
CO2: 31 mEq/L (ref 19–32)
Calcium: 9.6 mg/dL (ref 8.4–10.5)
GFR, Est African American: >89 mL/min
GFR, Est Non African American: 88 mL/min
Sodium: 143 mEq/L (ref 135–145)

## 2013-08-03 MED ORDER — FLUTICASONE PROPIONATE 50 MCG/ACT NA SUSP: 1.0000 | Freq: Two times a day (BID) | NASAL | Status: DC

## 2013-08-03 NOTE — Progress Notes (Signed)
  Subjective:    Patient ID: Nicole [name], female    DOB: 06-24-1961, 52 y.o.   MRN: [id number]  HPI Comments: Nicole [name] is a 52 year old woman with a PMH of HIV (CD4 1150, VL < 20, April 2014), HCV, HTN and allergic rhinitis.  She presents for 2 week history of non-productive cough.  The cough is more noticeable at night.  She notes a sore throat that has resolved with salt water rinses but denies other recent URI symptoms.  She denies postnasal drip, rhinorrhea, nasal congestion, reflux or heartburn.  She currently smokes two cigarettes daily.       Review of Systems  Constitutional: Negative for fever, appetite change and fatigue.  HENT: Negative for congestion, sore throat, rhinorrhea, trouble swallowing and postnasal drip.   Respiratory: Positive for cough. Negative for shortness of breath.   Cardiovascular: Negative for chest pain.  Gastrointestinal: Negative for diarrhea and constipation.  Genitourinary: Negative for dysuria.  Neurological: Negative for headaches.       Objective:   Physical Exam  Constitutional: She is oriented to person, place, and time. No distress.  HENT:  Head: Normocephalic and atraumatic.  Mouth/Throat: Oropharynx is clear and moist. No oropharyngeal exudate.  Eyes: Pupils are equal, round, and reactive to light. No scleral icterus.  Neck: Neck supple.  Cardiovascular: Normal rate, regular rhythm and normal heart sounds.   Pulmonary/Chest: Effort normal and breath sounds normal. No respiratory distress. She has no wheezes. She has no rales.  Abdominal: Soft. Bowel sounds are normal. She exhibits no distension. There is no tenderness.  Musculoskeletal:  2+ B/L lower extremity edema  Lymphadenopathy:    She has no cervical adenopathy.  Neurological: She is alert and oriented to person, place, and time.  Skin: Skin is warm. No rash noted. She is not diaphoretic. No erythema.  Psychiatric: She has a normal mood and affect. Her behavior is normal.           Assessment & Plan:  Please see problem based assessment and plan.

## 2013-08-03 NOTE — Assessment & Plan Note (Signed)
Patient currently smoking 2 cigarettes daily.  Advised to quit.

## 2013-08-03 NOTE — Assessment & Plan Note (Addendum)
Assessment:  Cough x 2 weeks with few other URI symptoms (resolved sore throat).  PCP unlikely in this patient with viral load < 20, CD4 1150 (April 2014), absence of fever or dyspnea.  Lisinopril ADR less likely as the patient has been taking this medication for a few years.  She has no GERD symptoms.  Patient with a history of seasonal allergies and cough more noticeable at night which may be due to postnasal drip.    Plan:    1) Flonase 1 spray each nostril BID  2) Return to office if cough does not improve; may consider changing ACEI at that time

## 2013-08-03 NOTE — Patient Instructions (Addendum)
General Instructions: 1. You have been prescribed Flonase.  Spray 1 spray in each nostril two times per day.  Please return to the clinic if this medication does not help your symptoms.   2. Please take all medications as prescribed.    3. If you have worsening of your symptoms or new symptoms arise, please call the clinic [phone]), or go to the ER immediately if symptoms are severe.   Treatment Goals:  Goals (1 Years of Data) as of 08/03/13         As of Today 03/10/13 01/26/13 01/26/13 01/26/13     Blood Pressure    . Blood Pressure < 140/90  133/80 119/79 135/84 132/81 140/84     Lifestyle    . Quit smoking / using tobacco            Progress Toward Treatment Goals:  Treatment Goal 08/03/2013  Blood pressure at goal  Stop smoking smoking less    Self Care Goals & Plans:  Self Care Goal 08/03/2013  Manage my medications take my medicines as prescribed; refill my medications on time  Eat healthy foods eat baked foods instead of fried foods; eat foods that are low in salt; eat smaller portions; drink diet soda or water instead of juice or soda  Stop smoking cut down the number of cigarettes smoked; go to the [employer] ([email or web address])    Fluticasone nasal solution What is this medicine? FLUTICASONE (floo TIK a sone) is a corticosteroid. It helps decrease inflammation in your nose. This medicine is used to treat the symptoms of allergies like sneezing, itching, and runny or stuffy nose. This medicine may be used for other purposes; ask your health care provider or pharmacist if you have questions. What should I tell my health care provider before I take this medicine? They need to know if you have any of these conditions: -infection, like tuberculosis, herpes, or fungal infection -recent surgery on nose or sinuses -taking corticosteroid by mouth -an unusual or allergic reaction to fluticasone, steroids, other medicines, foods, dyes, or preservatives -pregnant or  trying to get pregnant -breast-feeding How should I use this medicine? This medicine is for use in the nose. Follow the directions on your prescription label. This medicine works best if used regularly. Do not use more often than directed. Make sure that you are using your nasal spray correctly. Ask you doctor or health care provider if you have any questions. Talk to your pediatrician regarding the use of this medicine in children. While this drug may be prescribed for children as young as 24 years old for selected conditions, precautions do apply. Overdosage: If you think you have taken too much of this medicine contact a poison control center or emergency room at once. NOTE: This medicine is only for you. Do not share this medicine with others. What if I miss a dose? If you miss a dose, use it as soon as you remember. If it is almost time for your next dose, use only that dose and continue with your regular schedule. Do not use double or extra doses. What may interact with this medicine? -ketoconazole -metyrapone -some medicines for HIV -vaccines This list may not describe all possible interactions. Give your health care provider a list of all the medicines, herbs, non-prescription drugs, or dietary supplements you use. Also tell them if you smoke, drink alcohol, or use illegal drugs. Some items may interact with your medicine. What should I watch for while using this medicine? Visit  your doctor or health care professional for regular checks on your progress. Some symptoms may improve within 12 hours after starting use. Check with your doctor or health care professional if there is no improvement in your condition after 3 weeks of use. Do not come in contact with people who have chickenpox or the measles while you are taking this medicine. If you do, call your doctor right away. What side effects may I notice from receiving this medicine? Side effects that you should report to your doctor or  health care professional as soon as possible: -allergic reactions like skin rash, itching or hives, swelling of the face, lips, or tongue -changes in vision -flu-like symptoms -white patches or sores in the mouth or nose Side effects that usually do not require medical attention (report to your doctor or health care professional if they continue or are bothersome): -burning or irritation inside the nose or throat -cough -headache -nosebleed -unusual taste or smell This list may not describe all possible side effects. Call your doctor for medical advice about side effects. You may report side effects to FDA at 1-800-FDA-1088. Where should I keep my medicine? Keep out of the reach of children. Store at room temperature between 15 and 30 degrees C (59 and 86 degrees F). Throw away any unused medicine after the expiration date. NOTE: This sheet is a summary. It may not cover all possible information. If you have questions about this medicine, talk to your doctor, pharmacist, or health care provider.  2013, [employer]. (10/12/2008 10:40:16 AM)

## 2013-08-03 NOTE — Assessment & Plan Note (Addendum)
Assessment:  BP at goal,133/80.  Patient compliant with lisinopril-HCTZ 20/25mg  daily.    Plan:   1) Continue current medication.  2) Recheck BP at next visit.

## 2013-08-03 NOTE — Progress Notes (Signed)
I saw and evaluated the patient.  I personally confirmed the key portions of the history and exam documented by Dr. Wilson and I reviewed pertinent patient test results.  The assessment, diagnosis, and plan were formulated together and I agree with the documentation in the resident's note. 

## 2013-08-17 ENCOUNTER — Ambulatory Visit (HOSPITAL_COMMUNITY)
Admission: RE | Admit: 2013-08-17 | Discharge: 2013-08-17 | Disposition: A | Discharge status: Home / Self Care | Payer: Medicare Other | Source: Ambulatory Visit | Attending: Internal Medicine | Admitting: Internal Medicine

## 2013-08-17 DIAGNOSIS — Z23 Encounter for immunization: Secondary | ICD-10-CM

## 2013-08-17 DIAGNOSIS — Z1231 Encounter for screening mammogram for malignant neoplasm of breast: Secondary | ICD-10-CM

## 2013-08-31 ENCOUNTER — Other Ambulatory Visit: Payer: Self-pay | Admitting: Internal Medicine

## 2013-09-01 ENCOUNTER — Other Ambulatory Visit: Payer: Medicare Other

## 2013-09-02 ENCOUNTER — Other Ambulatory Visit: Payer: Medicare Other

## 2013-09-02 DIAGNOSIS — B20 Human immunodeficiency virus [HIV] disease: Secondary | ICD-10-CM

## 2013-09-03 LAB — T-HELPER CELL (CD4) - (RCID CLINIC ONLY): CD4 T Cell Abs: 1340 /uL (ref 400–2700)

## 2013-09-04 ENCOUNTER — Other Ambulatory Visit: Payer: Self-pay | Admitting: Internal Medicine

## 2013-09-08 ENCOUNTER — Other Ambulatory Visit: Payer: Self-pay | Admitting: Internal Medicine

## 2013-09-15 ENCOUNTER — Ambulatory Visit: Payer: Medicare Other | Admitting: Internal Medicine

## 2013-09-16 ENCOUNTER — Encounter: Payer: Self-pay | Admitting: Internal Medicine

## 2013-09-16 ENCOUNTER — Ambulatory Visit (INDEPENDENT_AMBULATORY_CARE_PROVIDER_SITE_OTHER): Payer: Medicare Other | Admitting: Internal Medicine

## 2013-09-16 VITALS — BP 113/79 | HR 76 | Temp 97.8°F | Wt 239.0 lb

## 2013-09-16 DIAGNOSIS — B20 Human immunodeficiency virus [HIV] disease: Secondary | ICD-10-CM

## 2013-09-16 DIAGNOSIS — Z79899 Other long term (current) drug therapy: Secondary | ICD-10-CM
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
## 2013-11-19 ENCOUNTER — Other Ambulatory Visit: Payer: Self-pay | Admitting: Internal Medicine

## 2013-11-20 ENCOUNTER — Telehealth: Payer: Self-pay | Admitting: *Deleted

## 2013-11-20 ENCOUNTER — Other Ambulatory Visit: Payer: Self-pay | Admitting: *Deleted

## 2013-11-20 ENCOUNTER — Other Ambulatory Visit: Payer: Self-pay | Admitting: Internal Medicine

## 2013-11-20 DIAGNOSIS — B20 Human immunodeficiency virus [HIV] disease: Secondary | ICD-10-CM

## 2013-11-20 MED ORDER — EFAVIRENZ-EMTRICITAB-TENOFOVIR 600-200-300 MG PO TABS: 1.0000 | ORAL_TABLET | Freq: Every day | ORAL | Status: DC

## 2013-11-20 NOTE — Telephone Encounter (Signed)
pt wanted Dr. Orvan Falconerampbell to know her next appt date and time.

## 2013-12-14 DIAGNOSIS — B182 Chronic viral hepatitis C: Secondary | ICD-10-CM | POA: Diagnosis not present

## 2014-01-27 DIAGNOSIS — M1710 Unilateral primary osteoarthritis, unspecified knee: Secondary | ICD-10-CM | POA: Diagnosis not present

## 2014-02-08 DIAGNOSIS — Z21 Asymptomatic human immunodeficiency virus [HIV] infection status: Secondary | ICD-10-CM | POA: Diagnosis not present

## 2014-02-08 DIAGNOSIS — B182 Chronic viral hepatitis C: Secondary | ICD-10-CM | POA: Diagnosis not present

## 2014-02-08 DIAGNOSIS — Z79899 Other long term (current) drug therapy: Secondary | ICD-10-CM | POA: Diagnosis not present

## 2014-03-16 ENCOUNTER — Other Ambulatory Visit: Payer: Medicare Other

## 2014-03-29 ENCOUNTER — Other Ambulatory Visit: Payer: Self-pay | Admitting: *Deleted

## 2014-03-29 MED ORDER — LISINOPRIL-HYDROCHLOROTHIAZIDE 20-25 MG PO TABS: ORAL_TABLET | ORAL | Status: DC

## 2014-03-30 ENCOUNTER — Ambulatory Visit: Payer: Medicare Other | Admitting: Internal Medicine

## 2014-03-31 ENCOUNTER — Other Ambulatory Visit: Payer: Medicare Other

## 2014-03-31 DIAGNOSIS — Z79899 Other long term (current) drug therapy: Secondary | ICD-10-CM

## 2014-03-31 DIAGNOSIS — B20 Human immunodeficiency virus [HIV] disease: Secondary | ICD-10-CM

## 2014-03-31 DIAGNOSIS — Z113 Encounter for screening for infections with a predominantly sexual mode of transmission: Secondary | ICD-10-CM

## 2014-03-31 LAB — CBC
HCT: 37.7 % (ref 36.0–46.0)
Hemoglobin: 13.1 g/dL (ref 12.0–15.0)
MCH: 30.3 pg (ref 26.0–34.0)
MCHC: 34.7 g/dL (ref 30.0–36.0)
MCV: 87.3 fL (ref 78.0–100.0)
PLATELETS: 204 10*3/uL (ref 150–400)
RBC: 4.32 MIL/uL (ref 3.87–5.11)
RDW: 14.1 % (ref 11.5–15.5)
WBC: 7.3 10*3/uL (ref 4.0–10.5)

## 2014-03-31 LAB — COMPREHENSIVE METABOLIC PANEL
ALBUMIN: 3.8 g/dL (ref 3.5–5.2)
ALT: 10 U/L (ref 0–35)
AST: 15 U/L (ref 0–37)
Alkaline Phosphatase: 74 U/L (ref 39–117)
BUN: 15 mg/dL (ref 6–23)
CO2: 22 meq/L (ref 19–32)
Calcium: 9.3 mg/dL (ref 8.4–10.5)
Chloride: 106 mEq/L (ref 96–112)
Creat: 0.66 mg/dL (ref 0.50–1.10)
GLUCOSE: 100 mg/dL — AB (ref 70–99)
POTASSIUM: 3.8 meq/L (ref 3.5–5.3)
SODIUM: 138 meq/L (ref 135–145)
TOTAL PROTEIN: 7.1 g/dL (ref 6.0–8.3)
Total Bilirubin: 0.3 mg/dL (ref 0.2–1.2)

## 2014-03-31 LAB — LIPID PANEL
CHOLESTEROL: 183 mg/dL (ref 0–200)
HDL: 62 mg/dL (ref >39)
LDL Cholesterol: 109 mg/dL — ABNORMAL HIGH (ref 0–99)
Total CHOL/HDL Ratio: 3 Ratio
Triglycerides: 58 mg/dL (ref <150)
VLDL: 12 mg/dL (ref 0–40)

## 2014-04-01 LAB — T-HELPER CELL (CD4) - (RCID CLINIC ONLY)
CD4 T CELL ABS: 1420 /uL (ref 400–2700)
CD4 T CELL HELPER: 43 % (ref 33–55)

## 2014-04-01 LAB — HIV-1 RNA QUANT-NO REFLEX-BLD
HIV 1 RNA Quant: <20 copies/mL (ref <20)
HIV-1 RNA Quant, Log: <1.3 {Log} (ref <1.30)

## 2014-04-01 LAB — RPR

## 2014-04-07 DIAGNOSIS — B20 Human immunodeficiency virus [HIV] disease: Secondary | ICD-10-CM | POA: Diagnosis not present

## 2014-04-07 DIAGNOSIS — Z79899 Other long term (current) drug therapy: Secondary | ICD-10-CM | POA: Diagnosis not present

## 2014-04-07 DIAGNOSIS — B182 Chronic viral hepatitis C: Secondary | ICD-10-CM | POA: Diagnosis not present

## 2014-04-16 ENCOUNTER — Telehealth: Payer: Self-pay | Admitting: *Deleted

## 2014-04-16 NOTE — Telephone Encounter (Signed)
Yes

## 2014-04-16 NOTE — Telephone Encounter (Signed)
[name] from [facility] called to ask if we could draw a Hep C viral load on this patient at her next visit to save her an extra trip there. She sees Dr Orvan Falconer 04/29/14. Advised him will ask the doctor and if we can he would like results faxed to Attn: [name], [facility] [phone]

## 2014-04-19 ENCOUNTER — Other Ambulatory Visit: Payer: Self-pay | Admitting: *Deleted

## 2014-04-19 DIAGNOSIS — B182 Chronic viral hepatitis C: Secondary | ICD-10-CM

## 2014-04-19 NOTE — Telephone Encounter (Signed)
Order placed for the patient to have her Hep C viral load at her next visit.

## 2014-04-21 ENCOUNTER — Other Ambulatory Visit: Payer: Medicare Other

## 2014-04-21 ENCOUNTER — Ambulatory Visit: Payer: Medicare Other | Admitting: Internal Medicine

## 2014-04-21 DIAGNOSIS — B182 Chronic viral hepatitis C: Secondary | ICD-10-CM | POA: Diagnosis not present

## 2014-04-23 LAB — HEPATITIS C RNA QUANTITATIVE: HCV Quantitative: NOT DETECTED IU/mL (ref <15)

## 2014-04-28 ENCOUNTER — Telehealth: Payer: Self-pay | Admitting: *Deleted

## 2014-04-28 NOTE — Telephone Encounter (Signed)
Pt called - swelling right foot since hot weather. Even before her feet hit the ground in  AM - foot is swollen. Offered an appt to be seen - last appt was in 07/2014 - prefers to wait till she goes to ID this month. Pt is able to put shoe on right foot. Stanton Kidney. Debra Ditzler RN 04/28/14 4:15PM

## 2014-04-29 ENCOUNTER — Ambulatory Visit: Payer: Medicare Other | Admitting: Internal Medicine

## 2014-05-04 ENCOUNTER — Encounter: Payer: Self-pay | Admitting: Internal Medicine

## 2014-05-04 ENCOUNTER — Ambulatory Visit (INDEPENDENT_AMBULATORY_CARE_PROVIDER_SITE_OTHER): Payer: Medicare Other | Admitting: Internal Medicine

## 2014-05-04 VITALS — BP 138/89 | HR 73 | Temp 97.6°F | Wt 250.2 lb

## 2014-05-04 DIAGNOSIS — B20 Human immunodeficiency virus [HIV] disease: Secondary | ICD-10-CM | POA: Diagnosis not present
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
## 2014-05-19 ENCOUNTER — Encounter: Payer: Self-pay | Admitting: Internal Medicine

## 2014-05-19 ENCOUNTER — Ambulatory Visit (INDEPENDENT_AMBULATORY_CARE_PROVIDER_SITE_OTHER): Payer: Medicare Other | Admitting: Internal Medicine

## 2014-05-19 ENCOUNTER — Ambulatory Visit (HOSPITAL_COMMUNITY)
Admission: RE | Admit: 2014-05-19 | Discharge: 2014-05-19 | Disposition: A | Discharge status: Home / Self Care | Payer: Medicare Other | Source: Ambulatory Visit | Attending: Internal Medicine | Admitting: Internal Medicine

## 2014-05-19 VITALS — BP 115/82 | HR 85 | Temp 97.1°F | Ht 67.0 in | Wt 247.6 lb

## 2014-05-19 DIAGNOSIS — B182 Chronic viral hepatitis C: Secondary | ICD-10-CM | POA: Diagnosis not present

## 2014-05-19 DIAGNOSIS — R05 Cough: Secondary | ICD-10-CM | POA: Diagnosis not present

## 2014-05-19 DIAGNOSIS — I10 Essential (primary) hypertension: Secondary | ICD-10-CM | POA: Diagnosis not present

## 2014-05-19 DIAGNOSIS — F17200 Nicotine dependence, unspecified, uncomplicated: Secondary | ICD-10-CM

## 2014-05-19 DIAGNOSIS — B20 Human immunodeficiency virus [HIV] disease: Secondary | ICD-10-CM

## 2014-05-19 DIAGNOSIS — R600 Localized edema: Secondary | ICD-10-CM

## 2014-05-19 DIAGNOSIS — M7989 Other specified soft tissue disorders: Secondary | ICD-10-CM | POA: Diagnosis not present

## 2014-05-19 DIAGNOSIS — Z23 Encounter for immunization: Secondary | ICD-10-CM | POA: Diagnosis present

## 2014-05-19 DIAGNOSIS — Z79899 Other long term (current) drug therapy: Secondary | ICD-10-CM | POA: Diagnosis not present

## 2014-05-19 DIAGNOSIS — R059 Cough, unspecified: Secondary | ICD-10-CM | POA: Diagnosis not present

## 2014-05-19 DIAGNOSIS — Z0000 Encounter for general adult medical examination without abnormal findings: Secondary | ICD-10-CM | POA: Diagnosis not present

## 2014-05-19 DIAGNOSIS — R609 Edema, unspecified: Secondary | ICD-10-CM

## 2014-05-19 HISTORY — DX: Localized edema: R60.0

## 2014-05-19 NOTE — Progress Notes (Signed)
VASCULAR LAB PRELIMINARY  PRELIMINARY  PRELIMINARY  PRELIMINARY  Left lower extremity venous duplex completed.    Preliminary report:  Left:  No evidence of DVT, superficial thrombosis, or Baker's cyst.  Onofre Gains, RVT 05/19/2014, 5:38 PM

## 2014-05-19 NOTE — Assessment & Plan Note (Signed)
Discussed importance of smoking cessation.  She is down to 1 cig/day.  Will continue to address at future visit.

## 2014-05-19 NOTE — Assessment & Plan Note (Signed)
Unilateral leg swelling x 1 month.  Occasional pain.  Non-tender on exam.  No dyspnea or PND to suggest HF and that should produce B/L leg swelling.  Possibly venous insufficiency, but again should be B/L.  She does wear a foot drop brace so perhaps she is not using that leg as much as she thinks.  Given unilateral swelling will order left lower extremity doppler. - STAT left lower extremity doppler - advised patient to elevate legs when possible

## 2014-05-19 NOTE — Progress Notes (Signed)
   Subjective:    Patient ID: Nicole [name], female    DOB: May 03, 1961, 53 y.o.   MRN: [id number]  HPI Comments: Nicole [name] is a 53 year old woman with a PMH of HIV (CD4 1420, VL < 20, 03/2014), HCV (s/p Harvoni, VL undetectable 04/2014), HTN and allergic rhinitis who present with c/o left leg swelling x 1 month.  Elevating legs improves the swelling.  Occasional leg pain.  No recent travel.  She wears brace on the leg for drop foot, no injury to the leg.  She has been smoking since she was a teenager but has cut back to 1 cigarette per day.  She moves around quite a bit, walks or takes the bus.  She has never had leg swelling before.  No dyspnea, PND.  No recent medication changes.       Review of Systems  Constitutional: Negative for fever, chills and appetite change.  HENT: Negative for postnasal drip, rhinorrhea and sore throat.   Eyes: Negative for visual disturbance.  Respiratory: Positive for cough. Negative for shortness of breath.   Cardiovascular: Positive for leg swelling. Negative for chest pain and palpitations.  Gastrointestinal: Negative for nausea, vomiting, abdominal pain, diarrhea and blood in stool.  Genitourinary: Negative for dysuria and frequency.  Neurological: Negative for dizziness, syncope, weakness and light-headedness.  Psychiatric/Behavioral: Negative for dysphoric mood.       Objective:   Physical Exam  Constitutional: She is oriented to person, place, and time. She appears well-developed. No distress.  HENT:  Head: Normocephalic and atraumatic.  Mouth/Throat: Oropharynx is clear and moist. No oropharyngeal exudate.  Eyes: EOM are normal. Pupils are equal, round, and reactive to light.  Cardiovascular: Normal rate, regular rhythm and normal heart sounds.  Exam reveals no gallop and no friction rub.   No murmur heard. Pulmonary/Chest: Effort normal and breath sounds normal. No respiratory distress. She has no wheezes. She has no rales.  Abdominal: Soft.  Bowel sounds are normal. She exhibits no distension. There is no tenderness.  Musculoskeletal: Normal range of motion. She exhibits edema. She exhibits no tenderness.  Left leg 1+ edema; foot drop brace in place Right leg with trace edema Calves non-tender  Neurological: She is alert and oriented to person, place, and time. No cranial nerve deficit.  Skin: Skin is warm. She is not diaphoretic.  Psychiatric: She has a normal mood and affect. Her behavior is normal.          Assessment & Plan:  Please see problem based assessment and plan.

## 2014-05-19 NOTE — Patient Instructions (Signed)
1. I am sending you for an ultrasound of your leg.  I will call you with results.     2. Please take all medications as prescribed.    3. If you have worsening of your symptoms or new symptoms arise, please call the clinic [phone]), or go to the ER immediately if symptoms are severe.

## 2014-05-19 NOTE — Assessment & Plan Note (Signed)
Compliant with Atripla.  HIV well controlled.

## 2014-05-19 NOTE — Assessment & Plan Note (Signed)
[facility] and by [facility].  Recently completed 12 weeks of Harvoni and HCV viral load undetectable, possibly cured. - [facility]

## 2014-05-19 NOTE — Assessment & Plan Note (Signed)
BP Readings from Last 3 Encounters:  05/19/14 115/82  05/04/14 138/89  09/16/13 113/79    Lab Results  Component Value Date   NA 138 03/31/2014   K 3.8 03/31/2014   CREATININE 0.66 03/31/2014    Assessment: Blood pressure control:  well controlled Progress toward BP goal:   at goal Comments:   Plan: Medications:  continue current medications:  Prinzide 20-25mg  daily Educational resources provided:   Self management tools provided:   Other plans: RTC in 3- 6 months for BP check

## 2014-05-20 ENCOUNTER — Telehealth: Payer: Self-pay | Admitting: Internal Medicine

## 2014-05-20 NOTE — Progress Notes (Signed)
INTERNAL MEDICINE TEACHING ATTENDING ADDENDUM - Shaylah Mcghie, MD: I reviewed and discussed at the time of visit with the resident Dr. Wilson, the patient's medical history, physical examination, diagnosis and results of tests and treatment and I agree with the patient's care as documented.    

## 2014-05-20 NOTE — Telephone Encounter (Signed)
Called to inform patient that lower extremity venous duplex was negative for DVT.  Advised she continue to elevate the legs when at rest.  Advised to return to clinic if increased swelling, development of pain or dyspnea.

## 2014-06-11 ENCOUNTER — Encounter: Payer: Self-pay | Admitting: Gastroenterology

## 2014-07-07 ENCOUNTER — Encounter: Payer: Self-pay | Admitting: Gastroenterology

## 2014-08-27 DIAGNOSIS — I639 Cerebral infarction, unspecified: Secondary | ICD-10-CM | POA: Diagnosis not present

## 2014-08-27 DIAGNOSIS — M25572 Pain in left ankle and joints of left foot: Secondary | ICD-10-CM | POA: Diagnosis not present

## 2014-08-27 DIAGNOSIS — I69354 Hemiplegia and hemiparesis following cerebral infarction affecting left non-dominant side: Secondary | ICD-10-CM | POA: Diagnosis not present

## 2014-09-11 ENCOUNTER — Other Ambulatory Visit: Payer: Self-pay | Admitting: Internal Medicine

## 2014-09-30 DIAGNOSIS — I639 Cerebral infarction, unspecified: Secondary | ICD-10-CM | POA: Diagnosis not present

## 2014-09-30 DIAGNOSIS — I69354 Hemiplegia and hemiparesis following cerebral infarction affecting left non-dominant side: Secondary | ICD-10-CM | POA: Diagnosis not present

## 2014-09-30 DIAGNOSIS — M25562 Pain in left knee: Secondary | ICD-10-CM | POA: Diagnosis not present

## 2014-09-30 DIAGNOSIS — E669 Obesity, unspecified: Secondary | ICD-10-CM | POA: Diagnosis not present

## 2014-10-11 ENCOUNTER — Other Ambulatory Visit: Payer: Medicare Other

## 2014-10-11 DIAGNOSIS — B20 Human immunodeficiency virus [HIV] disease: Secondary | ICD-10-CM | POA: Diagnosis not present

## 2014-10-12 LAB — T-HELPER CELL (CD4) - (RCID CLINIC ONLY)
CD4 % Helper T Cell: 46 % (ref 33–55)
CD4 T Cell Abs: 1410 /uL (ref 400–2700)

## 2014-10-12 LAB — HIV-1 RNA QUANT-NO REFLEX-BLD
HIV 1 RNA Quant: <20 copies/mL (ref <20)
HIV-1 RNA Quant, Log: <1.3 {Log} (ref <1.30)

## 2014-10-25 ENCOUNTER — Other Ambulatory Visit: Payer: Self-pay | Admitting: *Deleted

## 2014-10-25 ENCOUNTER — Encounter: Payer: Self-pay | Admitting: Internal Medicine

## 2014-10-25 ENCOUNTER — Ambulatory Visit (INDEPENDENT_AMBULATORY_CARE_PROVIDER_SITE_OTHER): Payer: Medicare Other | Admitting: Internal Medicine

## 2014-10-25 VITALS — BP 124/82 | HR 92 | Temp 97.6°F | Wt 254.5 lb

## 2014-10-25 DIAGNOSIS — B20 Human immunodeficiency virus [HIV] disease: Secondary | ICD-10-CM

## 2014-10-25 DIAGNOSIS — Z23 Encounter for immunization: Secondary | ICD-10-CM

## 2014-10-25 DIAGNOSIS — Z79899 Other long term (current) drug therapy: Secondary | ICD-10-CM | POA: Diagnosis not present

## 2014-10-25 DIAGNOSIS — I10 Essential (primary) hypertension: Secondary | ICD-10-CM

## 2014-10-25 MED ORDER — EFAVIRENZ-EMTRICITAB-TENOFOVIR 600-200-300 MG PO TABS: 1.0000 | ORAL_TABLET | Freq: Every day | ORAL | Status: DC

## 2014-10-25 MED ORDER — LISINOPRIL-HYDROCHLOROTHIAZIDE 20-25 MG PO TABS: 1.0000 | ORAL_TABLET | Freq: Every day | ORAL | Status: DC
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
## 2014-11-17 ENCOUNTER — Encounter: Payer: Self-pay | Admitting: Internal Medicine

## 2014-11-17 ENCOUNTER — Ambulatory Visit (INDEPENDENT_AMBULATORY_CARE_PROVIDER_SITE_OTHER): Payer: Medicare Other | Admitting: Internal Medicine

## 2014-11-17 VITALS — BP 110/73 | HR 70 | Temp 97.8°F | Ht 66.0 in | Wt 250.5 lb

## 2014-11-17 DIAGNOSIS — F17200 Nicotine dependence, unspecified, uncomplicated: Secondary | ICD-10-CM

## 2014-11-17 DIAGNOSIS — B20 Human immunodeficiency virus [HIV] disease: Secondary | ICD-10-CM

## 2014-11-17 DIAGNOSIS — E669 Obesity, unspecified: Secondary | ICD-10-CM | POA: Diagnosis not present

## 2014-11-17 DIAGNOSIS — Z1239 Encounter for other screening for malignant neoplasm of breast: Secondary | ICD-10-CM

## 2014-11-17 DIAGNOSIS — Z6835 Body mass index (BMI) 35.0-35.9, adult: Secondary | ICD-10-CM

## 2014-11-17 DIAGNOSIS — Z0000 Encounter for general adult medical examination without abnormal findings: Secondary | ICD-10-CM

## 2014-11-17 DIAGNOSIS — M1710 Unilateral primary osteoarthritis, unspecified knee: Secondary | ICD-10-CM

## 2014-11-17 DIAGNOSIS — Z791 Long term (current) use of non-steroidal anti-inflammatories (NSAID): Secondary | ICD-10-CM

## 2014-11-17 DIAGNOSIS — M179 Osteoarthritis of knee, unspecified: Secondary | ICD-10-CM

## 2014-11-17 DIAGNOSIS — Z21 Asymptomatic human immunodeficiency virus [HIV] infection status: Secondary | ICD-10-CM

## 2014-11-17 DIAGNOSIS — F17210 Nicotine dependence, cigarettes, uncomplicated: Secondary | ICD-10-CM

## 2014-11-17 DIAGNOSIS — I10 Essential (primary) hypertension: Secondary | ICD-10-CM | POA: Diagnosis not present

## 2014-11-17 DIAGNOSIS — B182 Chronic viral hepatitis C: Secondary | ICD-10-CM | POA: Diagnosis not present

## 2014-11-17 DIAGNOSIS — M170 Bilateral primary osteoarthritis of knee: Secondary | ICD-10-CM

## 2014-11-17 LAB — BASIC METABOLIC PANEL WITH GFR
BUN: 13 mg/dL (ref 6–23)
CALCIUM: 9.2 mg/dL (ref 8.4–10.5)
CO2: 27 meq/L (ref 19–32)
CREATININE: 0.66 mg/dL (ref 0.50–1.10)
Chloride: 105 mEq/L (ref 96–112)
GFR, Est African American: >89 mL/min
GFR, Est Non African American: >89 mL/min
GLUCOSE: 91 mg/dL (ref 70–99)
Potassium: 3.6 mEq/L (ref 3.5–5.3)
SODIUM: 141 meq/L (ref 135–145)

## 2014-11-17 MED ORDER — DICLOFENAC SODIUM 1 % TD GEL: 2.0000 g | Freq: Four times a day (QID) | TRANSDERMAL | Status: DC

## 2014-11-17 NOTE — Assessment & Plan Note (Addendum)
  Assessment: Progress toward smoking cessation:   still smoking same amount, 1 cig/day Barriers to progress toward smoking cessation:   habit, smokes 1 cig daily in the AM with coffee Comments:   Plan: Instruction/counseling given:  I counseled patient on the dangers of tobacco use, advised patient to stop smoking, and reviewed strategies to maximize success. Educational resources provided:  [employer](1-800-QUIT-NOW) brochure (cut back to 1 cig per day.) Self management tools provided:    Medications to assist with smoking cessation:  None Patient agreed to the following self-care plans for smoking cessation:    Other plans: continue to address smoking cessation at next visit.

## 2014-11-17 NOTE — Progress Notes (Signed)
   Subjective:    Patient ID: Nicole [name], female    DOB: 02/19/1961, 54 y.o.   MRN: [id number]  HPI Comments: [name] is a 54 year old woman with a PMH of HIV (CD4 1410, VL < 20, 09/2014), HCV (s/p Harvoni, VL undetectable 04/2014), HTN and allergic rhinitis here for follow-up.  Please see problem oriented charting for update of chronic conditions.        Review of Systems  Constitutional: Negative for fever, chills, appetite change and unexpected weight change.  Respiratory: Negative for shortness of breath.   Cardiovascular: Negative for chest pain, palpitations and leg swelling.  Gastrointestinal: Negative for nausea, vomiting, abdominal pain, diarrhea, constipation and blood in stool.  Genitourinary: Negative for dysuria and hematuria.  Musculoskeletal: Positive for arthralgias.       Chronic B/L knee pain   Neurological: Negative for syncope, weakness and light-headedness.       Filed Vitals:   11/17/14 1514  BP: 110/73  Pulse: 70  Temp: 97.8 F (36.6 C)    Objective:   Physical Exam  Constitutional: She is oriented to person, place, and time. She appears well-developed. No distress.  HENT:  Head: Normocephalic and atraumatic.  Mouth/Throat: Oropharynx is clear and moist. No oropharyngeal exudate.  Eyes: EOM are normal. Pupils are equal, round, and reactive to light.  Neck: Normal range of motion.  Cardiovascular: Normal rate, regular rhythm and normal heart sounds.  Exam reveals no gallop and no friction rub.   No murmur heard. Pulmonary/Chest: Effort normal and breath sounds normal. No respiratory distress. She has no wheezes. She has no rales.  Abdominal: Soft. Bowel sounds are normal. She exhibits no distension. There is no tenderness. There is no rebound.  Musculoskeletal: Normal range of motion. She exhibits edema and tenderness.  B/L 1+ lower extremity edema.  Minimal left medial knee TTP.  No crepitation.  No effusion.  Neurological: She is alert and  oriented to person, place, and time. No cranial nerve deficit.  Skin: Skin is warm. She is not diaphoretic.  Psychiatric: She has a normal mood and affect. Her behavior is normal.  Vitals reviewed.         Assessment & Plan:  Please see problem based assessment and plan.

## 2014-11-17 NOTE — Patient Instructions (Addendum)
1. I will refer you to PT and back to Dr. Lucky Rathkeosen's office.  I will also refer you for colonoscopy.  Please return to see me in 6 months or sooner if you have problems.    2. Please take all medications as prescribed.  You can try Voltaren gel (thin layer) applied to knee up to four times per day as needed for knee pain.     3. If you have worsening of your symptoms or new symptoms arise, please call the clinic [phone]), or go to the ER immediately if symptoms are severe.

## 2014-11-17 NOTE — Assessment & Plan Note (Signed)
She reports walking for exercise.  [facility]   Eats most meals at home.  Avoids fried foods, eats fast food once weekly (Fridays), drinks soda (about 1 soda per day) and water.

## 2014-11-17 NOTE — Assessment & Plan Note (Signed)
BP Readings from Last 3 Encounters:  11/17/14 110/73  10/25/14 124/82  05/19/14 115/82    Lab Results  Component Value Date   NA 138 03/31/2014   K 3.8 03/31/2014   CREATININE 0.66 03/31/2014    Assessment: Blood pressure control:  well controlled  Progress toward BP goal:   at goal Comments: reports compliance with BP med  Plan: Medications:  continue current medications:  Prinzide Educational resources provided:   Product managerelf management tools provided:   Other plans: BMP today.  RTC in 6 months.

## 2014-11-18 DIAGNOSIS — Z0000 Encounter for general adult medical examination without abnormal findings: Secondary | ICD-10-CM | POA: Insufficient documentation

## 2014-11-18 NOTE — Assessment & Plan Note (Signed)
[facility]  Compliant with Atripla. Well controlled.

## 2014-11-18 NOTE — Assessment & Plan Note (Signed)
Recent note by Dr. Orvan Falconerampbell refers to patient as cured.  She only has one additional [facility] remaining [facility].

## 2014-11-18 NOTE — Assessment & Plan Note (Addendum)
Due for screening colonoscopy.   She is agreeable to referral back to [facility].  Also agreeable to mammogram referral (it has been > 1 year).  Will send referrals with request to space out dates by 1 month so patient is not overwhelmed by doctor's appointments.

## 2014-11-18 NOTE — Assessment & Plan Note (Addendum)
She carries previous dx of OA and reports chronic B/L knee pain that responds to daily Aleve (2 pills in the AM).  Knee exam fairly unremarkable though she does have some tenderness on left.  She ambulates with the assistance of a cane and is requesting a sturdier cane.  She says previous surgery to right knee was performed by Dr. Turner Danielsowan.   - check BMP today given daily NSAID use - renal function is good, she can continue prn Aleve; no Tylenol given hx of HCV - rx for Voltaren gel to be applied up to 4x daily to knee prn for pain - PT eval for gait and recommendation for appropriate assistive device - work on exercise/weight loss to take some weight off of knee - DME shower chair to facilitate safe bathing/showering - B/L knee xrays - there are none in EPIC - will refer back to Dr. Turner Danielsowan to explore other treatment options, ie steroid injection, hyaluronate, surgery

## 2014-11-19 NOTE — Progress Notes (Signed)
Internal Medicine Clinic Attending  Case discussed with Dr. Wilson soon after the resident saw the patient.  We reviewed the resident's history and exam and pertinent patient test results.  I agree with the assessment, diagnosis, and plan of care documented in the resident's note.  

## 2014-11-24 ENCOUNTER — Encounter: Payer: Self-pay | Admitting: Gastroenterology

## 2014-11-29 ENCOUNTER — Ambulatory Visit: Payer: Medicare Other | Attending: Internal Medicine

## 2014-11-29 DIAGNOSIS — R29898 Other symptoms and signs involving the musculoskeletal system: Secondary | ICD-10-CM | POA: Diagnosis not present

## 2014-11-29 DIAGNOSIS — M25562 Pain in left knee: Secondary | ICD-10-CM | POA: Diagnosis not present

## 2014-11-29 DIAGNOSIS — M24652 Ankylosis, left hip: Secondary | ICD-10-CM | POA: Insufficient documentation

## 2014-11-29 DIAGNOSIS — M25662 Stiffness of left knee, not elsewhere classified: Secondary | ICD-10-CM

## 2014-11-29 DIAGNOSIS — M25652 Stiffness of left hip, not elsewhere classified: Secondary | ICD-10-CM | POA: Diagnosis present

## 2014-11-29 DIAGNOSIS — R262 Difficulty in walking, not elsewhere classified: Secondary | ICD-10-CM | POA: Insufficient documentation

## 2014-11-29 DIAGNOSIS — M25552 Pain in left hip: Secondary | ICD-10-CM | POA: Diagnosis not present
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
## 2014-12-07 ENCOUNTER — Telehealth: Payer: Self-pay | Admitting: Licensed Clinical Social Worker

## 2014-12-07 NOTE — Telephone Encounter (Signed)
Pt had not signed Section 2 Medicaid Beneficiary Consent to Release Information.  CSW read Section 2 over the phone and obtain verbal consent [facility] to [facility] information for the purpose of arranging transportation.

## 2014-12-07 NOTE — Telephone Encounter (Signed)
CSW received in mailbox, [employer] Exception form with Section 1 completed only.  CSW placed call to [name], message left and pt returned call.  [name] states "[place]assoZack.  I have to be there at 11am."  Pt unaware of the address or Dr. Ferd HibbsZack's specialty.  Pt states she was referred to Dr. Timothy LassoZack by Dr. Wanita Chamberlainamel.  CSW notified [name], generally out of county exception forms need to be completed 2 -4 weeks in advance.  Pt has been to this physician before but it has been over a year.  CSW will complete Section 2 and forward to PCP.   Reviewed pt's EMR.  Pt is established with Dr. Orvan Falconerampbell ([facility]) and [facility] Brooke [facility]acks [facility]  Confirmed appointment date and time with Dr. Jacqualine MauZacks office.  CSW will notify transportation, exception will be faxed this week.

## 2014-12-08 ENCOUNTER — Ambulatory Visit: Payer: Medicare Other | Admitting: Rehabilitation

## 2014-12-08 DIAGNOSIS — M25652 Stiffness of left hip, not elsewhere classified: Secondary | ICD-10-CM

## 2014-12-08 DIAGNOSIS — R29898 Other symptoms and signs involving the musculoskeletal system: Secondary | ICD-10-CM | POA: Diagnosis not present

## 2014-12-08 DIAGNOSIS — M25562 Pain in left knee: Secondary | ICD-10-CM | POA: Diagnosis not present

## 2014-12-08 DIAGNOSIS — M25552 Pain in left hip: Secondary | ICD-10-CM | POA: Diagnosis not present

## 2014-12-08 DIAGNOSIS — M24652 Ankylosis, left hip: Secondary | ICD-10-CM | POA: Diagnosis not present

## 2014-12-08 DIAGNOSIS — R262 Difficulty in walking, not elsewhere classified: Secondary | ICD-10-CM

## 2014-12-08 DIAGNOSIS — M25662 Stiffness of left knee, not elsewhere classified: Secondary | ICD-10-CM
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
## 2014-12-08 NOTE — Patient Instructions (Addendum)
Hip Flexor Stretch   Lying on back near edge of bed, bend one leg, foot flat. Hang other leg over edge, relaxed, foot resting  on stool/support  for __1-2__ minutes. Repeat ___2_ times. Do __2__ sessions per day. Isla Pence. Quad Set  CAN PRESS INTO TOWEL ROLL With other leg bent, foot flat, slowly tighten muscles on thigh of straight leg while counting out loud to _5___.  Repeat __10-20_ times. Do __2__ sessions per day.  http://gt2.exer.us/276   Copyright  VHI. All rights reserved.  External Rotation: Hip - Knees Apart (Hook-Lying)   NO BAND Lie with hips and knees bent, band tied just above knees. Pull knees apart. Hold for _5__ seconds.  Repeat __10-20_ times. Do __2_ times a day.  Copyright  VHI. All rights reserved.

## 2014-12-10 ENCOUNTER — Ambulatory Visit: Payer: Medicare Other

## 2014-12-10 DIAGNOSIS — M24652 Ankylosis, left hip: Secondary | ICD-10-CM | POA: Diagnosis not present

## 2014-12-10 DIAGNOSIS — M25562 Pain in left knee: Secondary | ICD-10-CM | POA: Diagnosis not present

## 2014-12-10 DIAGNOSIS — M25552 Pain in left hip: Secondary | ICD-10-CM

## 2014-12-10 DIAGNOSIS — M25662 Stiffness of left knee, not elsewhere classified: Secondary | ICD-10-CM

## 2014-12-10 DIAGNOSIS — R29898 Other symptoms and signs involving the musculoskeletal system: Secondary | ICD-10-CM

## 2014-12-10 DIAGNOSIS — R262 Difficulty in walking, not elsewhere classified: Secondary | ICD-10-CM | POA: Diagnosis not present

## 2014-12-10 DIAGNOSIS — M25652 Stiffness of left hip, not elsewhere classified: Secondary | ICD-10-CM

## 2014-12-10 NOTE — Telephone Encounter (Signed)
Exception form faxed this morning to [employer].  Transportation confirmed, pick up 5:40am - 6:20am to [place], [place] [facility] [place] [facility].  Return pick up 3-4pm.  CSW placed called to pt.  CSW left message with pick up times and direct number to [facility].
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
## 2014-12-13 DIAGNOSIS — B1920 Unspecified viral hepatitis C without hepatic coma: Secondary | ICD-10-CM | POA: Diagnosis not present

## 2014-12-15 ENCOUNTER — Ambulatory Visit: Payer: Medicare Other | Attending: Internal Medicine

## 2014-12-15 DIAGNOSIS — R262 Difficulty in walking, not elsewhere classified: Secondary | ICD-10-CM | POA: Diagnosis not present

## 2014-12-15 DIAGNOSIS — M25662 Stiffness of left knee, not elsewhere classified: Secondary | ICD-10-CM | POA: Insufficient documentation

## 2014-12-15 DIAGNOSIS — M25652 Stiffness of left hip, not elsewhere classified: Secondary | ICD-10-CM | POA: Diagnosis present

## 2014-12-15 DIAGNOSIS — M25562 Pain in left knee: Secondary | ICD-10-CM | POA: Diagnosis not present

## 2014-12-15 DIAGNOSIS — M24652 Ankylosis, left hip: Secondary | ICD-10-CM | POA: Diagnosis not present

## 2014-12-15 DIAGNOSIS — R29898 Other symptoms and signs involving the musculoskeletal system: Secondary | ICD-10-CM

## 2014-12-15 DIAGNOSIS — M25552 Pain in left hip: Secondary | ICD-10-CM | POA: Diagnosis not present
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
## 2014-12-17 ENCOUNTER — Ambulatory Visit: Payer: Medicare Other

## 2014-12-17 DIAGNOSIS — M25662 Stiffness of left knee, not elsewhere classified: Secondary | ICD-10-CM | POA: Diagnosis not present

## 2014-12-17 DIAGNOSIS — M24652 Ankylosis, left hip: Secondary | ICD-10-CM | POA: Diagnosis not present

## 2014-12-17 DIAGNOSIS — R262 Difficulty in walking, not elsewhere classified: Secondary | ICD-10-CM | POA: Diagnosis not present

## 2014-12-17 DIAGNOSIS — R29898 Other symptoms and signs involving the musculoskeletal system: Secondary | ICD-10-CM | POA: Diagnosis not present

## 2014-12-17 DIAGNOSIS — M25552 Pain in left hip: Secondary | ICD-10-CM | POA: Diagnosis not present

## 2014-12-17 DIAGNOSIS — M25562 Pain in left knee: Secondary | ICD-10-CM | POA: Diagnosis not present

## 2014-12-17 DIAGNOSIS — M25652 Stiffness of left hip, not elsewhere classified: Secondary | ICD-10-CM
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
## 2014-12-22 ENCOUNTER — Encounter: Payer: Medicare Other | Admitting: Physical Therapy

## 2014-12-22 DIAGNOSIS — M1712 Unilateral primary osteoarthritis, left knee: Secondary | ICD-10-CM | POA: Diagnosis not present

## 2014-12-23 ENCOUNTER — Encounter: Payer: Self-pay | Admitting: Internal Medicine

## 2015-01-05 ENCOUNTER — Ambulatory Visit (AMBULATORY_SURGERY_CENTER): Payer: Self-pay | Admitting: *Deleted

## 2015-01-05 ENCOUNTER — Telehealth: Payer: Self-pay | Admitting: *Deleted

## 2015-01-05 VITALS — Ht 67.0 in | Wt 239.8 lb

## 2015-01-05 DIAGNOSIS — Z86010 Personal history of colonic polyps: Secondary | ICD-10-CM

## 2015-01-05 MED ORDER — MOVIPREP 100 G PO SOLR: ORAL | Status: DC

## 2015-01-05 NOTE — Telephone Encounter (Signed)
Thank you for getting this history. Not sure where I got that from previously. It does sound like her mother never had colon cancer and so there is no need for her to have repeat colonoscopy at this point. Instead she should be put in for recall colonoscopy 10 years from her 2010 procedure. Thank you again

## 2015-01-05 NOTE — Telephone Encounter (Signed)
Patient came in today for pre-visit. She is an [place] female for recall colon. Her last colonoscopy was 2010, polypoid removed(see path report). In the colon report states her mother died of colon cancer, so repeat colon in 5 years. During the pre-visit patient denies this, she states her mother did not have colon cancer, we also asked her sister who was in waiting room today, she also states her mother did not have colon cancer. They both denied any family hx colon cancer and pt denies any GI problems. Just wanted to let you know. Okay for Recall now? Thanks,Robbin PV

## 2015-01-05 NOTE — Telephone Encounter (Signed)
Noted! Thanks! Recall placed for 06/2019 [phone]). Called patient again, left message. Gave patient my direct # in PV to call back.

## 2015-01-05 NOTE — Progress Notes (Signed)
Patient denies any allergies to eggs or soy. Patient denies any problems with anesthesia/sedation. Patient denies any oxygen use at home and does not take any diet/weight loss medications. No computer access per pt. Patient denies any family hx colon cancer, pt and [facility] mother having colon cancer.

## 2015-01-07 NOTE — Telephone Encounter (Signed)
Pt informed not due till 2020.

## 2015-01-19 ENCOUNTER — Encounter: Payer: Medicare Other | Admitting: Gastroenterology

## 2015-01-26 DIAGNOSIS — M1612 Unilateral primary osteoarthritis, left hip: Secondary | ICD-10-CM | POA: Diagnosis not present

## 2015-01-27 ENCOUNTER — Other Ambulatory Visit: Payer: Self-pay | Admitting: Internal Medicine

## 2015-02-16 ENCOUNTER — Ambulatory Visit (INDEPENDENT_AMBULATORY_CARE_PROVIDER_SITE_OTHER): Payer: Medicare Other | Admitting: Internal Medicine

## 2015-02-16 ENCOUNTER — Encounter: Payer: Self-pay | Admitting: Internal Medicine

## 2015-02-16 VITALS — BP 115/82 | HR 67 | Temp 97.8°F | Ht 66.0 in | Wt 240.6 lb

## 2015-02-16 DIAGNOSIS — I10 Essential (primary) hypertension: Secondary | ICD-10-CM | POA: Diagnosis not present

## 2015-02-16 DIAGNOSIS — B20 Human immunodeficiency virus [HIV] disease: Secondary | ICD-10-CM | POA: Diagnosis not present

## 2015-02-16 DIAGNOSIS — M6289 Other specified disorders of muscle: Secondary | ICD-10-CM | POA: Diagnosis not present

## 2015-02-16 DIAGNOSIS — F17200 Nicotine dependence, unspecified, uncomplicated: Secondary | ICD-10-CM

## 2015-02-16 DIAGNOSIS — M1612 Unilateral primary osteoarthritis, left hip: Secondary | ICD-10-CM | POA: Diagnosis not present

## 2015-02-16 DIAGNOSIS — Z0000 Encounter for general adult medical examination without abnormal findings: Secondary | ICD-10-CM

## 2015-02-16 DIAGNOSIS — Z720 Tobacco use: Secondary | ICD-10-CM

## 2015-02-16 DIAGNOSIS — R531 Weakness: Secondary | ICD-10-CM

## 2015-02-16 NOTE — Patient Instructions (Signed)
1. You can have your hip replacement.  I will send my note to Dr. Wadie Lessenowan's office.   2. Please take all medications as prescribed.    3. If you have worsening of your symptoms or new symptoms arise, please call the clinic [phone]), or go to the ER immediately if symptoms are severe.
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
## 2015-02-17 ENCOUNTER — Other Ambulatory Visit: Payer: Self-pay | Admitting: Orthopedic Surgery

## 2015-02-17 DIAGNOSIS — E669 Obesity, unspecified: Secondary | ICD-10-CM | POA: Diagnosis not present

## 2015-02-17 DIAGNOSIS — I69354 Hemiplegia and hemiparesis following cerebral infarction affecting left non-dominant side: Secondary | ICD-10-CM | POA: Diagnosis not present

## 2015-02-17 DIAGNOSIS — M25561 Pain in right knee: Secondary | ICD-10-CM | POA: Diagnosis not present

## 2015-02-17 DIAGNOSIS — I639 Cerebral infarction, unspecified: Secondary | ICD-10-CM | POA: Diagnosis not present

## 2015-02-17 NOTE — Assessment & Plan Note (Addendum)
Patient has previously c/o left knee pain and carried prior dx of knee OA but no imaging in EPIC.  I ordered knee xray and referred her to orthopedics (Dr. Turner Danielsowan) who she says performed her previous right knee surgery.  We still do not have imaging in EPIC.  [facility] when she went to see Dr. Turner Danielsowan and she says he told her she had severe hip pathology (not knee) and the left hip needed to be replaced.   She says they have discussed it and she wants to proceed with surgery.  She denies knee pain today.  She has well controlled HTN and HIV, no hx of DM/CKD/stroke/heart/lung disease, no allergy to general anesthesia that she is aware of, and she has had two prior successful surgeries (knee arthroscopy and tubal ligation).  Her Revised Cardiac Risk Index for Pre-op Risk for this intermediate risk surgery is 0.4%.  Based on this information and the fact that the patient has been in consult with a specialist and determined that she needs and wants surgery, I feel she is low risk for adverse events and can proceed with surgery as scheduled.  The patient says her sister will be staying with her post-op to provide extra support during recovery.  - The patient is still smoking 1 cig/day and she has been advised to stop smoking today in preparation for surgery in two weeks (and perhaps this will be her opportunity to quit altogether); she is agreeable to this recommendation - This note will be forwarded to Dr. Wadie Lessenowan's office. - I will have her return to see me in 2-3 months after she has recovered from surgery.

## 2015-02-17 NOTE — Assessment & Plan Note (Signed)
Well controlled on Atripla.  She is due for labs and to see Dr. Orvan Falconerampbell in June 2016.  I advised her to call and schedule appt sooner rather than later.

## 2015-02-17 NOTE — Assessment & Plan Note (Signed)
  Assessment: Progress toward smoking cessation:   unchanged, still smoking 1 cig/day Barriers to progress toward smoking cessation:   habit Comments: I have advised her to quit, especially in prep for her upcoming surgery.  Plan: Instruction/counseling given:  I counseled patient on the dangers of tobacco use, advised patient to stop smoking, and reviewed strategies to maximize success. Educational resources provided:   (REFUSED) Self management tools provided:    Medications to assist with smoking cessation:  None Patient agreed to the following self-care plans for smoking cessation: set a quit date and stop smoking  Other plans: Continue to address at future visits.

## 2015-02-17 NOTE — Assessment & Plan Note (Signed)
Screening colon not due until 2020 (see GI telephone note from 01/05/15).   Will defer referral for mammo until after her upcoming surgery.

## 2015-02-17 NOTE — Progress Notes (Signed)
Case discussed with Dr. Wilson soon after the resident saw the patient.  We reviewed the resident's history and exam and pertinent patient test results.  I agree with the assessment, diagnosis and plan of care documented in the resident's note. 

## 2015-02-17 NOTE — Assessment & Plan Note (Addendum)
BP Readings from Last 3 Encounters:  02/16/15 115/82  11/17/14 110/73  10/25/14 124/82    Lab Results  Component Value Date   NA 141 11/17/2014   K 3.6 11/17/2014   CREATININE 0.66 11/17/2014    Assessment: Blood pressure control:  well controlled  Progress toward BP goal:   at goal Comments: She reports compliance with medications.   Plan: Medications:  continue current medications:  Prinzide 20-25mg  daily Self management tools provided: other (see comments) Other plans: RTC in 2-3 months after recovery from surgery.
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
## 2015-02-19 NOTE — Pre-Procedure Instructions (Signed)
Nicole GraffCynthia S [name]  02/19/2015   Your procedure is scheduled on:  April 20  [facility] at 10:45 AM.  Call this number if you have problems the morning of surgery: [phone]   Remember:   Do not eat food or drink liquids after midnight.   Take these medicines the morning of surgery with A SIP OF WATER: Atripla, Flomase   STOP Fish Oil Wednesday, April 13   STOP/ Do not take Aspirin, Aleve, Naproxen, Advil, Ibuprofen, Motrin, Vitamins, Herbs, or Supplements starting Wednesday, April 13   Do not wear jewelry, make-up or nail polish.  Do not wear lotions, powders, or perfumes. You may wear deodorant.  Do not shave 48 hours prior to surgery. Men may shave face and neck.  Do not bring valuables to the hospital.  [facility] is not responsible for any belongings or valuables.               Contacts, dentures or bridgework may not be worn into surgery.  Leave suitcase in the car. After surgery it may be brought to your room.  For patients admitted to the hospital, discharge time is determined by your treatment team.               Special Instructions: [place] - Preparing for Surgery  Before surgery, you can play an important role.  Because skin is not sterile, your skin needs to be as free of germs as possible.  You can reduce the number of germs on you skin by washing with CHG (chlorahexidine gluconate) soap before surgery.  CHG is an antiseptic cleaner which kills germs and bonds with the skin to continue killing germs even after washing.  Please DO NOT use if you have an allergy to CHG or antibacterial soaps.  If your skin becomes reddened/irritated stop using the CHG and inform your nurse when you arrive at Short Stay.  Do not shave (including legs and underarms) for at least 48 hours prior to the first CHG shower.  You may shave your face.  Please follow these instructions carefully:   1.  Shower with CHG Soap the night before surgery and the morning  of Surgery.  2.  If you choose to wash your hair, wash your hair first as usual with your normal shampoo.  3.  After you shampoo, rinse your hair and body thoroughly to remove the shampoo.  4.  Use CHG as you would any other liquid soap.  You can apply CHG directly to the skin and wash gently with scrungie or a clean washcloth.  5.  Apply the CHG Soap to your body ONLY FROM THE NECK DOWN.  Do not use on open wounds or open sores.  Avoid contact with your eyes, ears, mouth and genitals (private parts).  Wash genitals (private parts) with your normal soap.  6.  Wash thoroughly, paying special attention to the area where your surgery will be performed.  7.  Thoroughly rinse your body with warm water from the neck down.  8.  DO NOT shower/wash with your normal soap after using and rinsing off the CHG Soap.  9.  Pat yourself dry with a clean towel.            10.  Wear clean pajamas.            11.  Place clean sheets on your bed the night of your first shower and do not sleep with pets.  Day  of Surgery  Do not apply any lotions the morning of surgery.  Please wear clean clothes to the hospital/surgery center.     Please read over the following fact sheets that you were given: Pain Booklet, Coughing and Deep Breathing and Surgical Site Infection Prevention

## 2015-02-21 ENCOUNTER — Ambulatory Visit (HOSPITAL_COMMUNITY)
Admission: RE | Admit: 2015-02-21 | Discharge: 2015-02-21 | Disposition: A | Discharge status: Home / Self Care | Payer: Medicare Other | Source: Ambulatory Visit | Attending: Orthopedic Surgery | Admitting: Orthopedic Surgery

## 2015-02-21 ENCOUNTER — Encounter (HOSPITAL_COMMUNITY): Payer: Self-pay

## 2015-02-21 ENCOUNTER — Encounter (HOSPITAL_COMMUNITY)
Admission: RE | Admit: 2015-02-21 | Discharge: 2015-02-21 | Disposition: A | Discharge status: Home / Self Care | Payer: Medicare Other | Source: Ambulatory Visit | Attending: Orthopedic Surgery | Admitting: Orthopedic Surgery

## 2015-02-21 DIAGNOSIS — Z01812 Encounter for preprocedural laboratory examination: Secondary | ICD-10-CM | POA: Diagnosis not present

## 2015-02-21 DIAGNOSIS — I10 Essential (primary) hypertension: Secondary | ICD-10-CM | POA: Insufficient documentation

## 2015-02-21 DIAGNOSIS — Z01818 Encounter for other preprocedural examination: Secondary | ICD-10-CM

## 2015-02-21 DIAGNOSIS — B20 Human immunodeficiency virus [HIV] disease: Secondary | ICD-10-CM | POA: Diagnosis not present

## 2015-02-21 LAB — CBC WITH DIFFERENTIAL/PLATELET
BASOS PCT: 0 % (ref 0–1)
Basophils Absolute: 0 10*3/uL (ref 0.0–0.1)
Eosinophils Absolute: 0.2 10*3/uL (ref 0.0–0.7)
Eosinophils Relative: 3 % (ref 0–5)
HEMATOCRIT: 42.1 % (ref 36.0–46.0)
HEMOGLOBIN: 14.3 g/dL (ref 12.0–15.0)
LYMPHS ABS: 3.6 10*3/uL (ref 0.7–4.0)
Lymphocytes Relative: 41 % (ref 12–46)
MCH: 30.5 pg (ref 26.0–34.0)
MCHC: 34 g/dL (ref 30.0–36.0)
MCV: 89.8 fL (ref 78.0–100.0)
MONO ABS: 0.6 10*3/uL (ref 0.1–1.0)
MONOS PCT: 6 % (ref 3–12)
NEUTROS ABS: 4.3 10*3/uL (ref 1.7–7.7)
Neutrophils Relative %: 50 % (ref 43–77)
Platelets: 227 10*3/uL (ref 150–400)
RBC: 4.69 MIL/uL (ref 3.87–5.11)
RDW: 13.7 % (ref 11.5–15.5)
WBC: 8.8 10*3/uL (ref 4.0–10.5)

## 2015-02-21 LAB — BASIC METABOLIC PANEL
Anion gap: 12 (ref 5–15)
BUN: 8 mg/dL (ref 6–23)
CO2: 25 mmol/L (ref 19–32)
Calcium: 9.5 mg/dL (ref 8.4–10.5)
Chloride: 104 mmol/L (ref 96–112)
Creatinine, Ser: 0.66 mg/dL (ref 0.50–1.10)
GFR calc Af Amer: >90 mL/min (ref >90)
GLUCOSE: 108 mg/dL — AB (ref 70–99)
POTASSIUM: 3 mmol/L — AB (ref 3.5–5.1)
SODIUM: 141 mmol/L (ref 135–145)

## 2015-02-21 LAB — URINALYSIS, ROUTINE W REFLEX MICROSCOPIC
Bilirubin Urine: NEGATIVE
Glucose, UA: NEGATIVE mg/dL
HGB URINE DIPSTICK: NEGATIVE
Ketones, ur: NEGATIVE mg/dL
Leukocytes, UA: NEGATIVE
Nitrite: NEGATIVE
Protein, ur: NEGATIVE mg/dL
Specific Gravity, Urine: 1.018 (ref 1.005–1.030)
UROBILINOGEN UA: 0.2 mg/dL (ref 0.0–1.0)
pH: 6 (ref 5.0–8.0)

## 2015-02-21 LAB — PROTIME-INR
INR: 0.96 (ref 0.00–1.49)
PROTHROMBIN TIME: 12.9 s (ref 11.6–15.2)

## 2015-02-21 LAB — APTT: APTT: 27 s (ref 24–37)

## 2015-02-21 LAB — SURGICAL PCR SCREEN
MRSA, PCR: NEGATIVE
Staphylococcus aureus: NEGATIVE

## 2015-02-21 LAB — TYPE AND SCREEN
ABO/RH(D): A POS
Antibody Screen: NEGATIVE

## 2015-02-21 LAB — ABO/RH: ABO/RH(D): A POS

## 2015-02-21 NOTE — Progress Notes (Signed)
   02/21/15 1224  OBSTRUCTIVE SLEEP APNEA  Have you ever been diagnosed with sleep apnea through a sleep study? No  Do you snore loudly (loud enough to be heard through closed doors)?  1  Do you often feel tired, fatigued, or sleepy during the daytime? 0  Has anyone observed you stop breathing during your sleep? 0  Do you have, or are you being treated for high blood pressure? 1  BMI more than 35 kg/m2? 1  Age over 56 years old? 1  Neck circumference greater than 40 cm/16 inches? 0  Gender: 0

## 2015-02-21 NOTE — Progress Notes (Signed)
PCP: Dr. Andrey CampanileWilson at [facility] Denies having a cardiologist  any cardiac studies done.  States she will go to the joint class this Wednesday, April 13. [facility] information sheet given.

## 2015-02-22 ENCOUNTER — Other Ambulatory Visit: Payer: Self-pay | Admitting: Internal Medicine

## 2015-02-22 ENCOUNTER — Telehealth: Payer: Self-pay | Admitting: *Deleted

## 2015-02-22 DIAGNOSIS — E876 Hypokalemia: Secondary | ICD-10-CM

## 2015-02-22 NOTE — Progress Notes (Signed)
Anesthesia Chart Review:  Pt is 54 year old female scheduled for L total hip arthroplasty on 03/02/2015 with Dr. Turner Danielsowan.   PMH includes: HTN, chronic hepatitis C, HIV. Current smoker. BMI 38. S/p L knee arthroscopy with chondroplasty on 01/26/13.  Preoperative labs reviewed.  K 3.0.  A hepatic function panel was not obtained at PAT. Labs from care everywhere from 12/13/14 show normal hepatic function. Will obtain HFP DOS.   Chest x-ray reviewed. No active cardiopulmonary disease.   EKG: NSR.   If labs DOS are acceptable, I anticipate pt can proceed as scheduled.   Rica Mastngela Kabbe, FNP-BC [facility]Anesthesiology Phone: [phone] 02/22/2015 1:15 PM

## 2015-02-22 NOTE — Telephone Encounter (Signed)
Pt called to report that her Potassium was 3.0 and she didn't know what she should do. I look in EPIC and see she is scheduled for surgery 4/20 for Hip Arthroplasty. Pre admission labs were done on 4/11 and K was low @ 3.0  I talked with Vassie MomentKabbe, FNP at short stay and she is aware of labs and will repeat on day of surgery.  Dr Andrey CampanileWilson would like pt to come to clinic for repeat labs, Potassium. Pt called and will come in tomorrow for labs only.   Please enter the order.

## 2015-02-22 NOTE — Telephone Encounter (Signed)
Future BMP ordered

## 2015-02-23 ENCOUNTER — Other Ambulatory Visit (INDEPENDENT_AMBULATORY_CARE_PROVIDER_SITE_OTHER): Payer: Medicare Other

## 2015-02-23 ENCOUNTER — Other Ambulatory Visit: Payer: Self-pay | Admitting: Internal Medicine

## 2015-02-23 DIAGNOSIS — M25552 Pain in left hip: Secondary | ICD-10-CM | POA: Diagnosis not present

## 2015-02-23 DIAGNOSIS — Z9189 Other specified personal risk factors, not elsewhere classified: Secondary | ICD-10-CM

## 2015-02-23 DIAGNOSIS — E876 Hypokalemia: Secondary | ICD-10-CM | POA: Diagnosis not present

## 2015-02-23 LAB — BASIC METABOLIC PANEL WITH GFR
BUN: 12 mg/dL (ref 6–23)
CALCIUM: 8.8 mg/dL (ref 8.4–10.5)
CO2: 23 meq/L (ref 19–32)
CREATININE: 0.53 mg/dL (ref 0.50–1.10)
Chloride: 107 mEq/L (ref 96–112)
GFR, Est Non African American: >89 mL/min
GLUCOSE: 95 mg/dL (ref 70–99)
Potassium: 3.4 mEq/L — ABNORMAL LOW (ref 3.5–5.3)
Sodium: 139 mEq/L (ref 135–145)
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
## 2015-02-28 ENCOUNTER — Other Ambulatory Visit (INDEPENDENT_AMBULATORY_CARE_PROVIDER_SITE_OTHER): Payer: Medicare Other

## 2015-02-28 ENCOUNTER — Other Ambulatory Visit: Payer: Self-pay | Admitting: Internal Medicine

## 2015-02-28 DIAGNOSIS — E876 Hypokalemia: Secondary | ICD-10-CM | POA: Diagnosis present

## 2015-02-28 LAB — BASIC METABOLIC PANEL
Anion gap: 11 (ref 5–15)
BUN: 9 mg/dL (ref 6–23)
CALCIUM: 9.3 mg/dL (ref 8.4–10.5)
CO2: 24 mmol/L (ref 19–32)
Chloride: 103 mmol/L (ref 96–112)
Creatinine, Ser: 0.65 mg/dL (ref 0.50–1.10)
GFR calc Af Amer: >90 mL/min (ref >90)
GLUCOSE: 101 mg/dL — AB (ref 70–99)
Potassium: 3.4 mmol/L — ABNORMAL LOW (ref 3.5–5.1)
SODIUM: 138 mmol/L (ref 135–145)

## 2015-02-28 MED ORDER — POTASSIUM CHLORIDE ER 20 MEQ PO TBCR: 40.0000 meq | EXTENDED_RELEASE_TABLET | Freq: Every day | ORAL | Status: DC

## 2015-03-01 ENCOUNTER — Telehealth: Payer: Self-pay | Admitting: *Deleted

## 2015-03-01 ENCOUNTER — Encounter: Payer: Self-pay | Admitting: Internal Medicine

## 2015-03-01 MED ORDER — DEXTROSE-NACL 5-0.45 % IV SOLN: INTRAVENOUS | Status: DC

## 2015-03-01 MED ORDER — VANCOMYCIN HCL 10 G IV SOLR
1500.0000 mg | INTRAVENOUS | Status: AC
  Administered 2015-03-02 (×2): 1500 mg via INTRAVENOUS
  Filled 2015-03-01: qty 1500

## 2015-03-01 MED ORDER — CHLORHEXIDINE GLUCONATE 4 % EX LIQD
60.0000 mL | Freq: Once | CUTANEOUS | Status: DC
  Filled 2015-03-01: qty 60

## 2015-03-01 NOTE — Telephone Encounter (Signed)
Requesting Surgical Clearance for pt - L Total Hip Arthorplasty, Dr. Gean BirchwoodFrank Rowan.  Dr. Orvan Falconerampbell wrote a letter and it was faxed to Surgical Coordinator - Agustin CreeKathy Blume, 03/01/15.

## 2015-03-02 ENCOUNTER — Inpatient Hospital Stay (HOSPITAL_COMMUNITY): Payer: Medicare Other

## 2015-03-02 ENCOUNTER — Encounter (HOSPITAL_COMMUNITY): Payer: Self-pay | Admitting: *Deleted

## 2015-03-02 ENCOUNTER — Inpatient Hospital Stay (HOSPITAL_COMMUNITY)
Admission: RE | Admit: 2015-03-02 | Discharge: 2015-03-07 | DRG: 470 | Disposition: A | Discharge status: Skilled Nursing Facility | Payer: Medicare Other | Source: Ambulatory Visit | Attending: Orthopedic Surgery | Admitting: Orthopedic Surgery

## 2015-03-02 ENCOUNTER — Inpatient Hospital Stay (HOSPITAL_COMMUNITY): Payer: Medicare Other | Admitting: Certified Registered Nurse Anesthetist

## 2015-03-02 ENCOUNTER — Encounter (HOSPITAL_COMMUNITY)
Admission: RE | Disposition: A | Discharge status: Skilled Nursing Facility | Payer: Self-pay | Source: Ambulatory Visit | Attending: Orthopedic Surgery

## 2015-03-02 ENCOUNTER — Inpatient Hospital Stay (HOSPITAL_COMMUNITY): Payer: Medicare Other | Admitting: Emergency Medicine

## 2015-03-02 DIAGNOSIS — Z96642 Presence of left artificial hip joint: Secondary | ICD-10-CM | POA: Diagnosis not present

## 2015-03-02 DIAGNOSIS — D62 Acute posthemorrhagic anemia: Secondary | ICD-10-CM | POA: Diagnosis not present

## 2015-03-02 DIAGNOSIS — Z8249 Family history of ischemic heart disease and other diseases of the circulatory system: Secondary | ICD-10-CM

## 2015-03-02 DIAGNOSIS — Z21 Asymptomatic human immunodeficiency virus [HIV] infection status: Secondary | ICD-10-CM | POA: Diagnosis present

## 2015-03-02 DIAGNOSIS — Z96649 Presence of unspecified artificial hip joint: Secondary | ICD-10-CM

## 2015-03-02 DIAGNOSIS — M25552 Pain in left hip: Secondary | ICD-10-CM | POA: Diagnosis not present

## 2015-03-02 DIAGNOSIS — B182 Chronic viral hepatitis C: Secondary | ICD-10-CM | POA: Diagnosis present

## 2015-03-02 DIAGNOSIS — Z833 Family history of diabetes mellitus: Secondary | ICD-10-CM | POA: Diagnosis not present

## 2015-03-02 DIAGNOSIS — I10 Essential (primary) hypertension: Secondary | ICD-10-CM | POA: Diagnosis present

## 2015-03-02 DIAGNOSIS — Z79899 Other long term (current) drug therapy: Secondary | ICD-10-CM

## 2015-03-02 DIAGNOSIS — M169 Osteoarthritis of hip, unspecified: Secondary | ICD-10-CM | POA: Diagnosis not present

## 2015-03-02 DIAGNOSIS — F17210 Nicotine dependence, cigarettes, uncomplicated: Secondary | ICD-10-CM | POA: Diagnosis present

## 2015-03-02 DIAGNOSIS — R278 Other lack of coordination: Secondary | ICD-10-CM | POA: Diagnosis not present

## 2015-03-02 DIAGNOSIS — Z6837 Body mass index (BMI) 37.0-37.9, adult: Secondary | ICD-10-CM | POA: Diagnosis not present

## 2015-03-02 DIAGNOSIS — E876 Hypokalemia: Secondary | ICD-10-CM | POA: Diagnosis not present

## 2015-03-02 DIAGNOSIS — M1612 Unilateral primary osteoarthritis, left hip: Secondary | ICD-10-CM | POA: Diagnosis present

## 2015-03-02 DIAGNOSIS — E119 Type 2 diabetes mellitus without complications: Secondary | ICD-10-CM | POA: Diagnosis present

## 2015-03-02 DIAGNOSIS — Z7982 Long term (current) use of aspirin: Secondary | ICD-10-CM

## 2015-03-02 DIAGNOSIS — M1610 Unilateral primary osteoarthritis, unspecified hip: Secondary | ICD-10-CM | POA: Diagnosis present

## 2015-03-02 DIAGNOSIS — Z96651 Presence of right artificial knee joint: Secondary | ICD-10-CM | POA: Diagnosis present

## 2015-03-02 DIAGNOSIS — B20 Human immunodeficiency virus [HIV] disease: Secondary | ICD-10-CM | POA: Diagnosis not present

## 2015-03-02 DIAGNOSIS — R2681 Unsteadiness on feet: Secondary | ICD-10-CM | POA: Diagnosis not present

## 2015-03-02 DIAGNOSIS — Z471 Aftercare following joint replacement surgery: Secondary | ICD-10-CM | POA: Diagnosis not present

## 2015-03-02 DIAGNOSIS — R269 Unspecified abnormalities of gait and mobility: Secondary | ICD-10-CM | POA: Diagnosis not present

## 2015-03-02 DIAGNOSIS — M62838 Other muscle spasm: Secondary | ICD-10-CM | POA: Diagnosis not present

## 2015-03-02 DIAGNOSIS — M6281 Muscle weakness (generalized): Secondary | ICD-10-CM | POA: Diagnosis not present

## 2015-03-02 HISTORY — PX: TOTAL HIP ARTHROPLASTY: SHX124

## 2015-03-02 LAB — POCT I-STAT 4, (NA,K, GLUC, HGB,HCT)
Glucose, Bld: 115 mg/dL — ABNORMAL HIGH (ref 70–99)
HCT: 41 % (ref 36.0–46.0)
HEMOGLOBIN: 13.9 g/dL (ref 12.0–15.0)
Potassium: 3.8 mmol/L (ref 3.5–5.1)
SODIUM: 142 mmol/L (ref 135–145)

## 2015-03-02 LAB — HEPATIC FUNCTION PANEL
ALBUMIN: 3.5 g/dL (ref 3.5–5.2)
ALT: 14 U/L (ref 0–35)
AST: 15 U/L (ref 0–37)
Alkaline Phosphatase: 76 U/L (ref 39–117)
BILIRUBIN INDIRECT: 0.5 mg/dL (ref 0.3–0.9)
BILIRUBIN TOTAL: 0.6 mg/dL (ref 0.3–1.2)
Bilirubin, Direct: 0.1 mg/dL (ref 0.0–0.5)
Total Protein: 7.4 g/dL (ref 6.0–8.3)

## 2015-03-02 SURGERY — ARTHROPLASTY, HIP, TOTAL,POSTERIOR APPROACH
Anesthesia: General | Site: Hip | Laterality: Left

## 2015-03-02 MED ORDER — HYDROMORPHONE HCL 1 MG/ML IJ SOLN
0.2500 mg | INTRAMUSCULAR | Status: DC | PRN
  Administered 2015-03-02 (×2): 0.5 mg via INTRAVENOUS

## 2015-03-02 MED ORDER — METHOCARBAMOL 1000 MG/10ML IJ SOLN
500.0000 mg | Freq: Four times a day (QID) | INTRAVENOUS | Status: DC | PRN
  Filled 2015-03-02: qty 5

## 2015-03-02 MED ORDER — PHENYLEPHRINE HCL 10 MG/ML IJ SOLN
10.0000 mg | INTRAVENOUS | Status: DC | PRN
  Administered 2015-03-02: 20 ug/min via INTRAVENOUS

## 2015-03-02 MED ORDER — EFAVIRENZ-EMTRICITAB-TENOFOVIR 600-200-300 MG PO TABS
1.0000 | ORAL_TABLET | Freq: Every day | ORAL | Status: DC
  Administered 2015-03-02 – 2015-03-07 (×6): 1 via ORAL
  Filled 2015-03-02 (×6): qty 1

## 2015-03-02 MED ORDER — LISINOPRIL 20 MG PO TABS
20.0000 mg | ORAL_TABLET | Freq: Every day | ORAL | Status: DC
  Administered 2015-03-02 – 2015-03-06 (×4): 20 mg via ORAL
  Filled 2015-03-02 (×5): qty 1

## 2015-03-02 MED ORDER — PROPOFOL INFUSION 10 MG/ML OPTIME
INTRAVENOUS | Status: DC | PRN
  Administered 2015-03-02: 30 ug/kg/min via INTRAVENOUS

## 2015-03-02 MED ORDER — OXYCODONE HCL 5 MG/5ML PO SOLN: 5.0000 mg | Freq: Once | ORAL | Status: AC | PRN

## 2015-03-02 MED ORDER — KCL IN DEXTROSE-NACL 20-5-0.45 MEQ/L-%-% IV SOLN
INTRAVENOUS | Status: DC
  Administered 2015-03-03: 01:00:00 via INTRAVENOUS
  Filled 2015-03-02 (×17): qty 1000

## 2015-03-02 MED ORDER — BISACODYL 5 MG PO TBEC
5.0000 mg | DELAYED_RELEASE_TABLET | Freq: Every day | ORAL | Status: DC | PRN
  Administered 2015-03-06: 5 mg via ORAL
  Filled 2015-03-02: qty 1

## 2015-03-02 MED ORDER — HYDROMORPHONE HCL 1 MG/ML IJ SOLN
0.5000 mg | INTRAMUSCULAR | Status: DC | PRN
  Administered 2015-03-02 – 2015-03-05 (×3): 1 mg via INTRAVENOUS
  Filled 2015-03-02 (×3): qty 1

## 2015-03-02 MED ORDER — SODIUM CHLORIDE 0.9 % IR SOLN
Status: DC | PRN
  Administered 2015-03-02: 1000 mL

## 2015-03-02 MED ORDER — LACTATED RINGERS IV SOLN
INTRAVENOUS | Status: DC
  Administered 2015-03-02: 11:00:00 via INTRAVENOUS

## 2015-03-02 MED ORDER — OXYCODONE HCL 5 MG PO TABS
ORAL_TABLET | ORAL | Status: AC
  Filled 2015-03-02: qty 1

## 2015-03-02 MED ORDER — SENNOSIDES-DOCUSATE SODIUM 8.6-50 MG PO TABS: 1.0000 | ORAL_TABLET | Freq: Every evening | ORAL | Status: DC | PRN

## 2015-03-02 MED ORDER — MIDAZOLAM HCL 5 MG/5ML IJ SOLN
INTRAMUSCULAR | Status: DC | PRN
Start: 2015-03-02 — End: 2015-03-02
  Administered 2015-03-02: 2 mg via INTRAVENOUS

## 2015-03-02 MED ORDER — PHENOL 1.4 % MT LIQD: 1.0000 | OROMUCOSAL | Status: DC | PRN

## 2015-03-02 MED ORDER — DIPHENHYDRAMINE HCL 12.5 MG/5ML PO ELIX
12.5000 mg | ORAL_SOLUTION | ORAL | Status: DC | PRN
Start: 2015-03-02 — End: 2015-03-07

## 2015-03-02 MED ORDER — POTASSIUM CHLORIDE ER 10 MEQ PO TBCR
40.0000 meq | EXTENDED_RELEASE_TABLET | Freq: Every day | ORAL | Status: DC
  Administered 2015-03-02 – 2015-03-07 (×6): 40 meq via ORAL
  Filled 2015-03-02 (×6): qty 4

## 2015-03-02 MED ORDER — ASPIRIN EC 325 MG PO TBEC
325.0000 mg | DELAYED_RELEASE_TABLET | Freq: Every day | ORAL | Status: DC
  Administered 2015-03-03 – 2015-03-07 (×5): 325 mg via ORAL
  Filled 2015-03-02 (×6): qty 1

## 2015-03-02 MED ORDER — METHOCARBAMOL 500 MG PO TABS: 500.0000 mg | ORAL_TABLET | Freq: Two times a day (BID) | ORAL | Status: DC

## 2015-03-02 MED ORDER — FLUTICASONE PROPIONATE 50 MCG/ACT NA SUSP
1.0000 | Freq: Two times a day (BID) | NASAL | Status: DC | PRN
  Filled 2015-03-02: qty 16

## 2015-03-02 MED ORDER — FENTANYL CITRATE (PF) 100 MCG/2ML IJ SOLN
INTRAMUSCULAR | Status: DC | PRN
  Administered 2015-03-02: 25 ug via INTRAVENOUS
  Administered 2015-03-02: 50 ug via INTRAVENOUS
  Administered 2015-03-02: 25 ug via INTRAVENOUS
  Administered 2015-03-02: 50 ug via INTRAVENOUS
  Administered 2015-03-02 (×2): 25 ug via INTRAVENOUS

## 2015-03-02 MED ORDER — METHOCARBAMOL 500 MG PO TABS
ORAL_TABLET | ORAL | Status: AC
  Filled 2015-03-02: qty 1

## 2015-03-02 MED ORDER — TRANEXAMIC ACID 1000 MG/10ML IV SOLN
1000.0000 mg | INTRAVENOUS | Status: AC
  Administered 2015-03-02: 1000 mg via INTRAVENOUS
  Filled 2015-03-02: qty 10

## 2015-03-02 MED ORDER — OXYCODONE-ACETAMINOPHEN 5-325 MG PO TABS: 1.0000 | ORAL_TABLET | ORAL | Status: DC | PRN

## 2015-03-02 MED ORDER — HYDROCHLOROTHIAZIDE 25 MG PO TABS
25.0000 mg | ORAL_TABLET | Freq: Every day | ORAL | Status: DC
  Administered 2015-03-02 – 2015-03-06 (×4): 25 mg via ORAL
  Filled 2015-03-02 (×5): qty 1

## 2015-03-02 MED ORDER — MIDAZOLAM HCL 2 MG/2ML IJ SOLN
INTRAMUSCULAR | Status: AC
  Filled 2015-03-02: qty 2

## 2015-03-02 MED ORDER — FLEET ENEMA 7-19 GM/118ML RE ENEM: 1.0000 | ENEMA | Freq: Once | RECTAL | Status: AC | PRN

## 2015-03-02 MED ORDER — PHENYLEPHRINE HCL 10 MG/ML IJ SOLN
INTRAMUSCULAR | Status: DC | PRN
  Administered 2015-03-02: 40 ug via INTRAVENOUS
  Administered 2015-03-02: 80 ug via INTRAVENOUS
  Administered 2015-03-02 (×3): 40 ug via INTRAVENOUS
  Administered 2015-03-02: 80 ug via INTRAVENOUS

## 2015-03-02 MED ORDER — METHOCARBAMOL 500 MG PO TABS
500.0000 mg | ORAL_TABLET | Freq: Four times a day (QID) | ORAL | Status: DC | PRN
  Administered 2015-03-02 – 2015-03-06 (×9): 500 mg via ORAL
  Filled 2015-03-02 (×11): qty 1

## 2015-03-02 MED ORDER — LISINOPRIL-HYDROCHLOROTHIAZIDE 20-25 MG PO TABS: 1.0000 | ORAL_TABLET | Freq: Every day | ORAL | Status: DC

## 2015-03-02 MED ORDER — OXYCODONE HCL 5 MG PO TABS
5.0000 mg | ORAL_TABLET | Freq: Once | ORAL | Status: AC | PRN
  Administered 2015-03-02: 5 mg via ORAL

## 2015-03-02 MED ORDER — ACETAMINOPHEN 650 MG RE SUPP
650.0000 mg | Freq: Four times a day (QID) | RECTAL | Status: DC | PRN
Start: 2015-03-02 — End: 2015-03-07

## 2015-03-02 MED ORDER — ACETAMINOPHEN 325 MG PO TABS
650.0000 mg | ORAL_TABLET | Freq: Four times a day (QID) | ORAL | Status: DC | PRN
Start: 2015-03-02 — End: 2015-03-07

## 2015-03-02 MED ORDER — ONDANSETRON HCL 4 MG/2ML IJ SOLN
4.0000 mg | Freq: Four times a day (QID) | INTRAMUSCULAR | Status: DC | PRN
  Administered 2015-03-04: 4 mg via INTRAVENOUS
  Filled 2015-03-02: qty 2

## 2015-03-02 MED ORDER — ASPIRIN EC 325 MG PO TBEC: 325.0000 mg | DELAYED_RELEASE_TABLET | Freq: Two times a day (BID) | ORAL | Status: DC

## 2015-03-02 MED ORDER — ONDANSETRON HCL 4 MG PO TABS
4.0000 mg | ORAL_TABLET | Freq: Four times a day (QID) | ORAL | Status: DC | PRN
  Administered 2015-03-06: 4 mg via ORAL
  Filled 2015-03-02: qty 1

## 2015-03-02 MED ORDER — PROMETHAZINE HCL 25 MG/ML IJ SOLN: 6.2500 mg | INTRAMUSCULAR | Status: DC | PRN

## 2015-03-02 MED ORDER — HYDROMORPHONE HCL 1 MG/ML IJ SOLN
INTRAMUSCULAR | Status: AC
  Administered 2015-03-02: 0.5 mg via INTRAVENOUS
  Filled 2015-03-02: qty 1

## 2015-03-02 MED ORDER — METOCLOPRAMIDE HCL 5 MG PO TABS: 5.0000 mg | ORAL_TABLET | Freq: Three times a day (TID) | ORAL | Status: DC | PRN

## 2015-03-02 MED ORDER — FENTANYL CITRATE (PF) 250 MCG/5ML IJ SOLN
INTRAMUSCULAR | Status: AC
  Filled 2015-03-02: qty 5

## 2015-03-02 MED ORDER — BUPIVACAINE-EPINEPHRINE 0.5% -1:200000 IJ SOLN
INTRAMUSCULAR | Status: DC | PRN
  Administered 2015-03-02: 20 mL

## 2015-03-02 MED ORDER — SODIUM CHLORIDE 0.9 % IV SOLN
INTRAVENOUS | Status: DC | PRN
  Administered 2015-03-02: 12:00:00 via INTRAVENOUS

## 2015-03-02 MED ORDER — OXYCODONE HCL 5 MG PO TABS
5.0000 mg | ORAL_TABLET | ORAL | Status: DC | PRN
  Administered 2015-03-02 – 2015-03-07 (×16): 10 mg via ORAL
  Filled 2015-03-02 (×17): qty 2

## 2015-03-02 MED ORDER — METOCLOPRAMIDE HCL 5 MG/ML IJ SOLN
5.0000 mg | Freq: Three times a day (TID) | INTRAMUSCULAR | Status: DC | PRN
  Administered 2015-03-04: 10 mg via INTRAVENOUS
  Filled 2015-03-02: qty 2

## 2015-03-02 MED ORDER — DEXAMETHASONE SODIUM PHOSPHATE 10 MG/ML IJ SOLN
10.0000 mg | Freq: Once | INTRAMUSCULAR | Status: AC
  Administered 2015-03-03: 10 mg via INTRAVENOUS
  Filled 2015-03-02: qty 1

## 2015-03-02 MED ORDER — ALUMINUM HYDROXIDE GEL 320 MG/5ML PO SUSP
15.0000 mL | ORAL | Status: DC | PRN
  Filled 2015-03-02: qty 30

## 2015-03-02 MED ORDER — MENTHOL 3 MG MT LOZG: 1.0000 | LOZENGE | OROMUCOSAL | Status: DC | PRN

## 2015-03-02 MED ORDER — BUPIVACAINE-EPINEPHRINE (PF) 0.5% -1:200000 IJ SOLN
INTRAMUSCULAR | Status: AC
  Filled 2015-03-02: qty 30

## 2015-03-02 MED ORDER — BUPIVACAINE IN DEXTROSE 0.75-8.25 % IT SOLN
INTRATHECAL | Status: DC | PRN
  Administered 2015-03-02: 15 mg via INTRATHECAL

## 2015-03-02 MED ORDER — DOCUSATE SODIUM 100 MG PO CAPS
100.0000 mg | ORAL_CAPSULE | Freq: Two times a day (BID) | ORAL | Status: DC
  Administered 2015-03-02 – 2015-03-07 (×7): 100 mg via ORAL
  Filled 2015-03-02 (×9): qty 1

## 2015-03-02 MED ORDER — ONDANSETRON HCL 4 MG/2ML IJ SOLN
INTRAMUSCULAR | Status: DC | PRN
  Administered 2015-03-02: 4 mg via INTRAVENOUS

## 2015-03-02 SURGICAL SUPPLY — 55 items
BLADE SAW SGTL 18X1.27X75 (BLADE) ×2 IMPLANT
BLADE SAW SGTL 18X1.27X75MM (BLADE) ×1
BRUSH FEMORAL CANAL (MISCELLANEOUS) IMPLANT
CAPT HIP TOTAL 2 ×3 IMPLANT
COVER BACK TABLE 24X17X13 BIG (DRAPES) IMPLANT
COVER SURGICAL LIGHT HANDLE (MISCELLANEOUS) ×6 IMPLANT
DRAPE IMP U-DRAPE 54X76 (DRAPES) ×3 IMPLANT
DRAPE ORTHO SPLIT 77X108 STRL (DRAPES) ×2
DRAPE PROXIMA HALF (DRAPES) ×3 IMPLANT
DRAPE SURG ORHT 6 SPLT 77X108 (DRAPES) ×1 IMPLANT
DRAPE U-SHAPE 47X51 STRL (DRAPES) ×3 IMPLANT
DRILL BIT 7/64X5 (BIT) ×3 IMPLANT
DRSG AQUACEL AG ADV 3.5X10 (GAUZE/BANDAGES/DRESSINGS) IMPLANT
DRSG AQUACEL AG ADV 3.5X14 (GAUZE/BANDAGES/DRESSINGS) ×3 IMPLANT
DURAPREP 26ML APPLICATOR (WOUND CARE) ×3 IMPLANT
ELECT BLADE 4.0 EZ CLEAN MEGAD (MISCELLANEOUS)
ELECT REM PT RETURN 9FT ADLT (ELECTROSURGICAL) ×3
ELECTRODE BLDE 4.0 EZ CLN MEGD (MISCELLANEOUS) IMPLANT
ELECTRODE REM PT RTRN 9FT ADLT (ELECTROSURGICAL) ×1 IMPLANT
GLOVE BIO SURGEON STRL SZ7.5 (GLOVE) ×3 IMPLANT
GLOVE BIO SURGEON STRL SZ8.5 (GLOVE) ×6 IMPLANT
GLOVE BIOGEL PI IND STRL 8 (GLOVE) ×2 IMPLANT
GLOVE BIOGEL PI IND STRL 9 (GLOVE) ×1 IMPLANT
GLOVE BIOGEL PI INDICATOR 8 (GLOVE) ×4
GLOVE BIOGEL PI INDICATOR 9 (GLOVE) ×2
GOWN STRL REUS W/ TWL LRG LVL3 (GOWN DISPOSABLE) ×2 IMPLANT
GOWN STRL REUS W/ TWL XL LVL3 (GOWN DISPOSABLE) ×3 IMPLANT
GOWN STRL REUS W/TWL LRG LVL3 (GOWN DISPOSABLE) ×4
GOWN STRL REUS W/TWL XL LVL3 (GOWN DISPOSABLE) ×6
HANDPIECE INTERPULSE COAX TIP (DISPOSABLE)
HOOD PEEL AWAY FACE SHEILD DIS (HOOD) ×6 IMPLANT
KIT BASIN OR (CUSTOM PROCEDURE TRAY) ×3 IMPLANT
KIT ROOM TURNOVER OR (KITS) ×3 IMPLANT
MANIFOLD NEPTUNE II (INSTRUMENTS) ×3 IMPLANT
NEEDLE 22X1 1/2 (OR ONLY) (NEEDLE) ×3 IMPLANT
NS IRRIG 1000ML POUR BTL (IV SOLUTION) ×3 IMPLANT
PACK TOTAL JOINT (CUSTOM PROCEDURE TRAY) ×3 IMPLANT
PACK UNIVERSAL I (CUSTOM PROCEDURE TRAY) ×3 IMPLANT
PAD ARMBOARD 7.5X6 YLW CONV (MISCELLANEOUS) ×6 IMPLANT
PASSER SUT SWANSON 36MM LOOP (INSTRUMENTS) ×3 IMPLANT
PRESSURIZER FEMORAL UNIV (MISCELLANEOUS) IMPLANT
SET HNDPC FAN SPRY TIP SCT (DISPOSABLE) IMPLANT
SUT ETHIBOND 2 V 37 (SUTURE) ×3 IMPLANT
SUT VIC AB 0 CTB1 27 (SUTURE) ×3 IMPLANT
SUT VIC AB 1 CTX 36 (SUTURE) ×2
SUT VIC AB 1 CTX36XBRD ANBCTR (SUTURE) ×1 IMPLANT
SUT VIC AB 2-0 CTB1 (SUTURE) ×3 IMPLANT
SUT VIC AB 3-0 SH 27 (SUTURE) ×2
SUT VIC AB 3-0 SH 27X BRD (SUTURE) ×1 IMPLANT
SYR CONTROL 10ML LL (SYRINGE) ×3 IMPLANT
TOWEL OR 17X24 6PK STRL BLUE (TOWEL DISPOSABLE) ×3 IMPLANT
TOWEL OR 17X26 10 PK STRL BLUE (TOWEL DISPOSABLE) ×3 IMPLANT
TOWER CARTRIDGE SMART MIX (DISPOSABLE) IMPLANT
TRAY FOLEY CATH 14FR (SET/KITS/TRAYS/PACK) IMPLANT
WATER STERILE IRR 1000ML POUR (IV SOLUTION) ×12 IMPLANT

## 2015-03-02 NOTE — Transfer of Care (Signed)
Immediate Anesthesia Transfer of Care Note  Patient: Nicole [name]  Procedure(s) Performed: Procedure(s): TOTAL HIP ARTHROPLASTY (Left)  Patient Location: PACU  Anesthesia Type:MAC and Spinal  Level of Consciousness: awake, alert  and oriented  Airway & Oxygen Therapy: Patient Spontanous Breathing  Post-op Assessment: Report given to RN and Post -op Vital signs reviewed and stable  Post vital signs: Reviewed and stable  Last Vitals:  Filed Vitals:   03/02/15 1030  BP: 116/69  Pulse: 72  Temp: 36.4 C  Resp: 20    Complications: No apparent anesthesia complications

## 2015-03-02 NOTE — Anesthesia Preprocedure Evaluation (Addendum)
Anesthesia Evaluation  Patient identified by MRN, date of birth, ID band Patient awake    Reviewed: Allergy & Precautions, NPO status , Patient's Chart, lab work & pertinent test results  History of Anesthesia Complications Negative for: history of anesthetic complications  Airway Mallampati: II  TM Distance: >3 FB Neck ROM: Full    Dental  (+) Poor Dentition, Dental Advisory Given   Pulmonary Current Smoker,    Pulmonary exam normal       Cardiovascular hypertension, Pt. on medications     Neuro/Psych negative neurological ROS  negative psych ROS   GI/Hepatic negative GI ROS, (+) Hepatitis -, C  Endo/Other  Morbid obesity  Renal/GU      Musculoskeletal   Abdominal   Peds  Hematology  (+) HIV,   Anesthesia Other Findings   Reproductive/Obstetrics                            Anesthesia Physical Anesthesia Plan  ASA: III  Anesthesia Plan: MAC and Spinal   Post-op Pain Management:    Induction:   Airway Management Planned: Simple Face Mask  Additional Equipment:   Intra-op Plan:   Post-operative Plan:   Informed Consent: I have reviewed the patients History and Physical, chart, labs and discussed the procedure including the risks, benefits and alternatives for the proposed anesthesia with the patient or authorized representative who has indicated his/her understanding and acceptance.   Dental advisory given  Plan Discussed with: CRNA and Anesthesiologist  Anesthesia Plan Comments:        Anesthesia Quick Evaluation

## 2015-03-02 NOTE — Op Note (Signed)
OPERATIVE REPORT    DATE OF PROCEDURE:  03/02/2015       PREOPERATIVE DIAGNOSIS:  LEFT HIP OSTEOARTHRITIS                                                           POSTOPERATIVE DIAGNOSIS:  LEFT HIP OSTEOARTHRITIS                                                            PROCEDURE:  L total hip arthroplasty using a 52 mm DePuy Pinnacle  Cup, [employer], 10-degree polyethylene liner index superior  and posterior, a +0 36 mm ceramic head, a 33x17x42x160 SROM stem, 22Dsm Sleeve   SURGEON: Nicole [name]    ASSISTANT:   Eric K. Gaylene Brooks  (present throughout entire procedure and necessary for timely completion of the procedure)   ANESTHESIA: spinal BLOOD LOSS: 300 FLUID REPLACEMENT: 1800 crystalloid Antibiotic: IV Tranexamic Acid: 1gm IV Vancomycin COMPLICATIONS: none    INDICATIONS FOR PROCEDURE: A 54 y.o. year-old With  LEFT HIP OSTEOARTHRITIS    for 5 years, x-rays show bone-on-bone arthritic changes. Despite conservative measures with observation, anti-inflammatory medicine, narcotics, use of a cane, has severe unremitting pain and can ambulate only a few blocks before resting.  Patient desires elective L total hip arthroplasty to decrease pain and increase function. The risks, benefits, and alternatives were discussed at length including but not limited to the risks of infection, bleeding, nerve injury, stiffness, blood clots, the need for revision surgery, cardiopulmonary complications, among others, and they were willing to proceed. Questions answered     PROCEDURE IN DETAIL: The patient was identified by armband,  received preoperative IV antibiotics in the holding area at [facility], taken to the operating room , appropriate anesthetic monitors  were attached and general endotracheal anesthesia induced. Pt was rolled into the R lateral decubitus position and fixed there with a Stulberg Mark II pelvic clamp.  The L lower extremity was then prepped and draped   in the usual sterile fashion from the ankle to the hemipelvis. A time-out  procedure was performed. The skin along the lateral hip and thigh  infiltrated with 10 mL of 0.5% Marcaine and epinephrine solution. We  then made a posterolateral approach to the hip. With a #10 blade, a 25 cm  incision was made through the skin and subcutaneous tissue down to the level of the  IT band. Small bleeders were identified and cauterized. The IT band was cut in  line with skin incision exposing the greater trochanter. A Cobra retractor was placed between the gluteus minimus and the superior hip joint capsule, and a spiked Cobra between the quadratus femoris and the inferior hip joint capsule. This isolated the short  external rotators and piriformis tendons. These were tagged with a #2 Ethibond  suture and cut off their insertion on the intertrochanteric crest. The posterior  capsule was then developed into an acetabular-based flap from Posterior Superior off of the acetabulum out over the femoral neck and back posterior inferior to the acetabular rim. This flap was tagged with two #2 Ethibond  sutures and retracted protecting the sciatic nerve. This exposed the arthritic femoral head and osteophytes. The hip was then flexed and internally rotated, dislocating the femoral head and a standard neck cut performed 1 fingerbreadth above the lesser trochanter.  A spiked Cobra was placed in the cotyloid notch and a Hohmann retractor was then used to lever the femur anteriorly off of the anterior pelvic column. A posterior-inferior wing retractor was placed at the junction of the acetabulum and the ischium completing the acetabular exposure.We then removed the peripheral osteophytes and labrum from the acetabulum. We then reamed the acetabulum up to 51 mm with basket reamers obtaining good coverage in all quadrants. We then irrigated with normal  saline solution and hammered into place a 52 mm pinnacle cup in 45  degrees of  abduction and about 25 degrees of anteversion. More  peripheral osteophytes removed and a trial 10-degree liner placed with the  index superior-posterior. The hip was then flexed and internally rotated exposing the  proximal femur, which was entered with the initiating reamer followed by  the axial reamers up to a 17.5 mm full depth and 18mm partial depth. We then conically reamed to 22D to the correct depth for a 42 base neck. The calcar was milled to 22Dsm. A trial sleeve and stem was inserted in the 20 degrees anteversion, with a +0 36mm trial head. Trial reduction was then performed and excellent stability was noted with at 90 of flexion with 65 of internal rotation and then full extension with maximal external rotation. The hip could not be dislocated in full extension. The knee could easily flex  to about 130 degrees. We also stretched the abductors at this point,  because of the preexisting adductor contractures. All trial components  were then removed. The acetabulum was irrigated out with normal saline  solution. A titanium [facility] Eliminator was then screwed into place  followed by a 10-degree polyethylene liner index superior-posterior. On  the femoral side a 22Fsm ZTT1 sleeve was hammered into place, followed by a 22x17x160x42 SROM stem in 25 degrees of anteversion. At this point, a +0 36 mm ceramic head was  hammered on the stem. The hip was reduced. We checked our stability  one more time and found it to be excellent. The wound was once again  thoroughly irrigated out with normal saline solution hand lavage. The  capsular flap and short external rotators were repaired back to the  intertrochanteric crest through drill holes with a #2 Ethibond suture.  The IT band was closed with running 1 Vicryl suture. The subcutaneous  tissue with 0 and 2-0 undyed Vicryl suture and the skin with running  3-0 vicryl subcuticular suture. Aquacil dressing was applied. The patient was then unclamped,  rolled supine, awaken extubated and taken to recovery room without difficulty in stable condition.   Nicole [name] 03/02/2015, 2:50 PM

## 2015-03-02 NOTE — Plan of Care (Signed)
Problem: Consults Goal: Diagnosis- Total Joint Replacement Primary Total Hip LEft

## 2015-03-02 NOTE — Interval H&P Note (Signed)
History and Physical Interval Note:  03/02/2015 1:01 PM  Nicole GraffCynthia S Boxley  has presented today for surgery, with the diagnosis of LEFT HIP OSTEOARTHRITIS   The various methods of treatment have been discussed with the patient and family. After consideration of risks, benefits and other options for treatment, the patient has consented to  Procedure(s): TOTAL HIP ARTHROPLASTY (Left) as a surgical intervention .  The patient's history has been reviewed, patient examined, no change in status, stable for surgery.  I have reviewed the patient's chart and labs.  Questions were answered to the patient's satisfaction.     Nestor LewandowskyOWAN,Kia Varnadore J

## 2015-03-02 NOTE — Anesthesia Procedure Notes (Signed)
Spinal Patient location during procedure: OR Start time: 03/02/2015 1:19 PM End time: 03/02/2015 1:29 PM Staffing Anesthesiologist: Heather RobertsSINGER, Nicole [name] Performed by: anesthesiologist  Preanesthetic Checklist Completed: patient identified, surgical consent, pre-op evaluation, timeout performed, IV checked, risks and benefits discussed and monitors and equipment checked Spinal Block Patient position: sitting Prep: Betadine Patient monitoring: cardiac monitor, continuous pulse ox and blood pressure Approach: midline Location: L3-4 Injection technique: single-shot Needle Needle type: Quincke  Needle gauge: 25 G Needle length: 9 cm Additional Notes Functioning IV was confirmed and monitors were applied. Sterile prep and drape, including hand hygiene and sterile gloves were used. The patient was positioned and the spine was prepped. The skin was anesthetized with lidocaine.  Free flow of clear CSF was obtained prior to injecting local anesthetic into the CSF.  The spinal needle aspirated freely following injection.  The needle was carefully withdrawn.  The patient tolerated the procedure well. Difficult placement, multiple attempts.

## 2015-03-02 NOTE — Discharge Instructions (Signed)

## 2015-03-03 ENCOUNTER — Encounter (HOSPITAL_COMMUNITY): Payer: Self-pay | Admitting: General Practice

## 2015-03-03 LAB — BASIC METABOLIC PANEL
ANION GAP: 8 (ref 5–15)
BUN: 6 mg/dL (ref 6–23)
CHLORIDE: 101 mmol/L (ref 96–112)
CO2: 26 mmol/L (ref 19–32)
Calcium: 8.5 mg/dL (ref 8.4–10.5)
Creatinine, Ser: 0.69 mg/dL (ref 0.50–1.10)
GFR calc Af Amer: >90 mL/min (ref >90)
GFR calc non Af Amer: >90 mL/min (ref >90)
Glucose, Bld: 157 mg/dL — ABNORMAL HIGH (ref 70–99)
Potassium: 3.7 mmol/L (ref 3.5–5.1)
Sodium: 135 mmol/L (ref 135–145)

## 2015-03-03 LAB — CBC
HEMATOCRIT: 34.8 % — AB (ref 36.0–46.0)
HEMOGLOBIN: 11.9 g/dL — AB (ref 12.0–15.0)
MCH: 30.4 pg (ref 26.0–34.0)
MCHC: 34.2 g/dL (ref 30.0–36.0)
MCV: 89 fL (ref 78.0–100.0)
Platelets: 200 10*3/uL (ref 150–400)
RBC: 3.91 MIL/uL (ref 3.87–5.11)
RDW: 13.4 % (ref 11.5–15.5)
WBC: 13.5 10*3/uL — AB (ref 4.0–10.5)

## 2015-03-03 NOTE — Care Management Note (Signed)
CARE MANAGEMENT NOTE 03/03/2015  Patient:  Nicole [name],Nicole [name]   Account Number:  [id number]  Date Initiated:  03/03/2015  Documentation initiated by:  Vance PeperBRADY,Desiree Fleming  Subjective/Objective Assessment:   54 yr old female admitted with left hip osteoarthritis, patient underwent a left total hip arthroplasty.     Action/Plan:   [facility] Therapies reccommending SNF. Case manager will continue to monitor.   Anticipated DC Date:     Anticipated DC Plan:    In-house referral  Clinical Social Worker      DC Planning Services  CM consult      Choice offered to / [facility]:          [facility]  [facility]      [facility]   Status of service:  In process, will continue to follow

## 2015-03-03 NOTE — Progress Notes (Signed)
Utilization review completed.  

## 2015-03-03 NOTE — Anesthesia Postprocedure Evaluation (Signed)
  Anesthesia Post-op Note  Patient: Nicole [name]  Procedure(s) Performed: Procedure(s): TOTAL HIP ARTHROPLASTY (Left)  Patient Location: PACU  Anesthesia Type:Spinal  Level of Consciousness: awake, alert  and oriented  Airway and Oxygen Therapy: Patient Spontanous Breathing  Post-op Pain: none  Post-op Assessment: Post-op Vital signs reviewed, Patient's Cardiovascular Status Stable and Respiratory Function Stable  Post-op Vital Signs: Reviewed and stable  Last Vitals:  Filed Vitals:   03/03/15 0618  BP: 108/70  Pulse: 100  Temp: 37.3 C  Resp: 16    Complications: No apparent anesthesia complications

## 2015-03-03 NOTE — Care Management Note (Signed)
CARE MANAGEMENT NOTE 03/03/2015  Patient:  Bea GraffWYNN,Ladon S   Account Number:  [id number]  Date Initiated:  03/03/2015  Documentation initiated by:  Vance PeperBRADY,Cyani Kallstrom  Subjective/Objective Assessment:   54 yr old female admitted with left hip osteoarthritis, patient underwent a left total hip arthroplasty.     Action/Plan:   [facility] Patient has improved with PT, will plan to go home. Has DME.   Anticipated DC Date:  03/04/2015   Anticipated DC Plan:  HOME W HOME HEALTH SERVICES  In-house referral  Clinical Social Worker      DC Planning Services  CM consult      [facility]   Choice offered to / List presented to:  C-1 Patient        HH arranged  [facility] PT      [facility]   Status of service:  Completed, signed off Medicare Important Message given?   (If response is "NO", the following Medicare IM given date fields will be blank) Date Medicare IM given:   Medicare IM given by:   Date Additional Medicare IM given:   Additional Medicare IM given by:    Discharge Disposition:  HOME W HOME HEALTH SERVICES  Per UR Regulation:  Reviewed for med. necessity/level of care/duration of stay

## 2015-03-03 NOTE — Evaluation (Signed)
Physical Therapy Evaluation Patient Details Name: Nicole [name] MRN: [id number] DOB: [birth year] Today's Date: 03/03/2015   History of Present Illness  54 y.o. female s/p left total hip arthroplasty. History of hepatitis C and Human immunodeficiency virus (HIV) disease.  Clinical Impression  Pt is s/p Left THA resulting in the deficits listed below (see PT Problem List). Ambulates with min assist for stability up to 25 feet today. Mod assist for transfer. Tolerated exercises well and reviewed hip precaution handout. She has limited assistance at home. Pt will benefit from skilled PT to increase their independence and safety with mobility to allow discharge to the venue listed below.      Follow Up Recommendations SNF    Equipment Recommendations  Rolling walker with 5" wheels;3in1 (PT)    Recommendations for Other Services       Precautions / Restrictions Precautions Precautions: Posterior Hip Precaution Booklet Issued: Yes (comment) Precaution Comments: reviewed handout Restrictions Weight Bearing Restrictions: Yes LLE Weight Bearing: Weight bearing as tolerated      Mobility  Bed Mobility               General bed mobility comments: In chair  Transfers Overall transfer level: Needs assistance Equipment used: Rolling walker (2 wheeled) Transfers: Sit to/from Stand Sit to Stand: Mod assist         General transfer comment: Mod assist for boost to stand from reclining chair. VC for hand placement. requires extra time.  Ambulation/Gait Ambulation/Gait assistance: Min assist Ambulation Distance (Feet): 25 Feet Assistive device: Rolling walker (2 wheeled) Gait Pattern/deviations: Step-to pattern;Decreased step length - right;Decreased stride length;Decreased stance time - left;Antalgic;Trunk flexed;Narrow base of support Gait velocity: slow Gait velocity interpretation: Below normal speed for age/gender General Gait Details: Educated on safe DME use with a  rolling walker. Min assist for balance and support. Verbal and tactile cues for upright posture with poor correction.  Max cues to widen stance, RLE adducts significantly.  Stairs            Wheelchair Mobility    Modified Rankin (Stroke Patients Only)       Balance Overall balance assessment: Needs assistance Sitting-balance support: No upper extremity supported;Feet supported Sitting balance-Leahy Scale: Fair     Standing balance support: Bilateral upper extremity supported Standing balance-Leahy Scale: Poor                               Pertinent Vitals/Pain Pain Assessment: 0-10 Pain Score: 3  Pain Location: Lt hip Pain Descriptors / Indicators: Aching Pain Intervention(s): Monitored during session;Repositioned    Home Living Family/patient expects to be discharged to:: Unsure Living Arrangements: Other relatives Available Help at Discharge: Family (sister is on dialysis 3x/week) Type of Home: House Home Access: Stairs to enter Entrance Stairs-Rails: Doctor, general practice of Steps: 3 Home Layout: One level Home Equipment: Cane - single point;Shower seat      Prior Function Level of Independence: Independent with assistive device(s)         Comments: cane for mobility     Hand Dominance   Dominant Hand: Right    Extremity/Trunk Assessment   Upper Extremity Assessment: Defer to OT evaluation           Lower Extremity Assessment: LLE deficits/detail   LLE Deficits / Details: decreased strength and ROM at hip as expected post op. Hx of LLE deficits from previous injury.      Communication  Communication: No difficulties  Cognition Arousal/Alertness: Awake/alert Behavior During Therapy: WFL for tasks assessed/performed Overall Cognitive Status: Within Functional Limits for tasks assessed                      General Comments      Exercises Total Joint Exercises Ankle Circles/Pumps: AROM;Both;15  reps;Seated Quad Sets: Strengthening;Both;10 reps;Seated Long Arc Quad: Strengthening;Both;10 reps;Seated (limited)      Assessment/Plan    PT Assessment Patent does not need any further PT services;Patient needs continued PT services  PT Diagnosis Difficulty walking;Abnormality of gait;Acute pain   PT Problem List Decreased strength;Decreased range of motion;Decreased activity tolerance;Decreased balance;Decreased mobility;Decreased knowledge of use of DME;Decreased knowledge of precautions;Impaired tone;Pain  PT Treatment Interventions DME instruction;Gait training;Functional mobility training;Therapeutic activities;Therapeutic exercise;Balance training;Neuromuscular re-education;Patient/family education;Modalities   PT Goals (Current goals can be found in the Care Plan section) Acute Rehab PT Goals Patient Stated Goal: go to rehab PT Goal Formulation: With patient Time For Goal Achievement: 03/10/15 Potential to Achieve Goals: Good    Frequency 7X/week   Barriers to discharge Decreased caregiver support lives with sister who is on dialysis    Co-evaluation               End of Session Equipment Utilized During Treatment: Gait belt Activity Tolerance: Patient limited by fatigue Patient left: in chair;with call bell/phone within reach Nurse Communication: Mobility status         Time: 0912-0938 PT Time Calculation (min) (ACUTE ONLY): 26 min   Charges:   PT Evaluation $Initial PT Evaluation Tier I: 1 Procedure PT Treatments $Gait Training: 8-22 mins   PT G CodesBerton [name]:        Nicole [name] S 03/03/2015, 10:23 AM Nicole MerlesLogan [name] Elyshia Kumagai, PT [phone]

## 2015-03-03 NOTE — Progress Notes (Signed)
Physical Therapy Treatment Patient Details Name: Nicole [name] MRN: [id number] DOB: [birth year] Today's Date: 03/03/2015    History of Present Illness 54 y.o. female s/p left total hip arthroplasty. History of hepatitis C and Human immunodeficiency virus (HIV) disease.    PT Comments    Patient progressing towards physical therapy goals. Ambulates up to 35 feet this afternoon with min assist for stability. Tolerated exercises fairly although with increased pain compared to AM session. Required reinforcement of hip precautions as pt unable to recall at start of therapy. Patient will continue to benefit from skilled physical therapy services to further improve independence with functional mobility.   Follow Up Recommendations  SNF     Equipment Recommendations  Rolling walker with 5" wheels;3in1 (PT)    Recommendations for Other Services       Precautions / Restrictions Precautions Precautions: Posterior Hip Precaution Comments: reviewed handout Restrictions Weight Bearing Restrictions: Yes LLE Weight Bearing: Weight bearing as tolerated    Mobility  Bed Mobility Overal bed mobility: Needs Assistance Bed Mobility: Sit to Supine       Sit to supine: Min assist   General bed mobility comments: Min assist for LLE support to bring back into bed. Vc for technique.  Transfers Overall transfer level: Needs assistance Equipment used: Rolling walker (2 wheeled) Transfers: Sit to/from Stand Sit to Stand: Min assist         General transfer comment: Min assist for boost to stand. VC for hand placement and technique. Leans heavily over RW initially. Cues for upright posture.  Ambulation/Gait Ambulation/Gait assistance: Min assist Ambulation Distance (Feet): 35 Feet Assistive device: Rolling walker (2 wheeled) Gait Pattern/deviations: Step-to pattern;Decreased step length - right;Decreased stance time - left;Decreased step length - left;Decreased stride length;Antalgic;Trunk  flexed;Narrow base of support Gait velocity: slow   General Gait Details: Cues for upright posture frequently. Slightly improved from AM session. Continues to have great difficulty widening step width despite cues. Min assist for support on Lt side throughout distance.   Stairs            Wheelchair Mobility    Modified Rankin (Stroke Patients Only)       Balance                                    Cognition Arousal/Alertness: Awake/alert Behavior During Therapy: WFL for tasks assessed/performed Overall Cognitive Status: Within Functional Limits for tasks assessed       Memory: Decreased recall of precautions;Decreased short-term memory              Exercises Total Joint Exercises Ankle Circles/Pumps: AROM;Both;Supine;10 reps Heel Slides: AAROM;Left;10 reps;Supine Hip ABduction/ADduction: AAROM;Left;10 reps;Supine    General Comments        Pertinent Vitals/Pain Pain Assessment: 0-10 Pain Score:  ("I'm more sore now" no value given) Pain Location: Lt hip Pain Descriptors / Indicators: Aching Pain Intervention(s): Monitored during session;Repositioned    Home Living Family/patient expects to be discharged to:: Skilled nursing facility Living Arrangements: Other relatives (sister) Available Help at Discharge: Family (sister is on dialysis 3x/week) Type of Home: House Home Access: Stairs to enter Entrance Stairs-Rails: Right;Left Home Layout: One level Home Equipment: Cane - single point;Shower seat      Prior Function Level of Independence: Independent with assistive device(s)      Comments: cane for mobility   PT Goals (current goals can now be found in the care plan  section) Acute Rehab PT Goals Patient Stated Goal: go to rehab PT Goal Formulation: With patient Time For Goal Achievement: 03/10/15 Potential to Achieve Goals: Good Progress towards PT goals: Progressing toward goals    Frequency  7X/week    PT Plan Current  plan remains appropriate    Co-evaluation             End of Session Equipment Utilized During Treatment: Gait belt Activity Tolerance: Patient limited by fatigue Patient left: with call bell/phone within reach;in bed;with SCD's reapplied     Time: [id number] PT Time Calculation (min) (ACUTE ONLY): 22 min  Charges:  $Gait Training: 8-22 mins                    G Codes:      Berton MountBarbour, Lowell Mcgurk S 03/03/2015, 3:44 PM Sunday [facility]illers[facility] [place], [place] [phone]

## 2015-03-03 NOTE — Progress Notes (Addendum)
Patient ID: Bea GraffCynthia S Schiltz, female   DOB: [birth year], 54 y.o.   MRN: [id number] PATIENT ID: Bea GraffCynthia S Rijo  MRN: [id number]  DOB/AGE:  [birth year] / 54 y.o.  1 Day Post-Op Procedure(s) (LRB): TOTAL HIP ARTHROPLASTY (Left)    PROGRESS NOTE Subjective: Patient is alert, oriented, no Nausea, no Vomiting, yes passing gas, no Bowel Movement. Taking PO well. Denies SOB, Chest or Calf Pain. Using Incentive Spirometer, PAS in place. Ambulate WBAT today Patient reports pain as 4 on 0-10 scale  .    Objective: Vital signs in last 24 hours: Filed Vitals:   03/02/15 1758 03/02/15 2112 03/03/15 0155 03/03/15 0618  BP: 128/86 112/68 148/63 108/70  Pulse: 88 98 97 100  Temp: 97.1 F (36.2 C) 99.1 F (37.3 C) 98.6 F (37 C) 99.2 F (37.3 C)  TempSrc: Oral Oral    Resp: 18 16 16 16   Weight:      SpO2: 100% 98% 94% 100%      Intake/Output from previous day: I/O last 3 completed shifts: In: 3160 [P.O.:360; I.V.:2800] Out: 1100 [Urine:600; Blood:500]   Intake/Output this shift: Total I/O In: 64.6 [I.V.:64.6] Out: -    LABORATORY DATA:  Recent Labs  02/28/15 1057 03/02/15 1038  HGB  --  13.9  HCT  --  41.0  NA 138 142  K 3.4* 3.8  CL 103  --   CO2 24  --   BUN 9  --   CREATININE 0.65  --   GLUCOSE 101* 115*  CALCIUM 9.3  --     Examination: Neurologically intact ABD soft Neurovascular intact Sensation intact distally Intact pulses distally Dorsiflexion/Plantar flexion intact Incision: scant drainage No cellulitis present Compartment soft} XR AP&Lat of hip shows well placed\fixed THA  Assessment:   1 Day Post-Op Procedure(s) (LRB): TOTAL HIP ARTHROPLASTY (Left) ADDITIONAL DIAGNOSIS:  Expected Acute Blood Loss Anemia, Diabetes, Hypertension and s/p head trauma age 89 with static motor weakness, Hep C, HIV.  Plan: PT/OT WBAT, THA  posterior precautions  DVT Prophylaxis: SCDx72 hrs, ASA 325 mg BID x 2 weeks  DISCHARGE PLAN: Home, may need SNF if slow with PT, has good  help at home  DISCHARGE NEEDS: HHPT, CPM, Walker and 3-in-1 comode seat

## 2015-03-03 NOTE — Progress Notes (Signed)
OT Cancellation Note  Patient Details Name: Nicole [name] MRN: [id number] DOB: [birth year]   Cancelled Treatment:    Reason Eval/Treat Not Completed: Other (comment) Pt is Medicare and current D/C plan is SNF. No apparent immediate acute care OT needs, therefore will defer OT to SNF. If OT eval is needed please call Acute Rehab Dept. at [phone] or text page OT at [phone].  [facility],[name]  Keelan Pomerleau, OTR/L  [phone] 03/03/2015 03/03/2015, 12:08 PM

## 2015-03-04 LAB — CBC
HCT: 32.5 % — ABNORMAL LOW (ref 36.0–46.0)
Hemoglobin: 11.6 g/dL — ABNORMAL LOW (ref 12.0–15.0)
MCH: 31.3 pg (ref 26.0–34.0)
MCHC: 35.7 g/dL (ref 30.0–36.0)
MCV: 87.6 fL (ref 78.0–100.0)
Platelets: 173 10*3/uL (ref 150–400)
RBC: 3.71 MIL/uL — ABNORMAL LOW (ref 3.87–5.11)
RDW: 13.2 % (ref 11.5–15.5)
WBC: 17.7 10*3/uL — ABNORMAL HIGH (ref 4.0–10.5)

## 2015-03-04 NOTE — Clinical Social Work Note (Signed)
PT is recommending SNF at time of dc.  Patient is set up with her MD office as a bundle patient.  Per MD RNCM, patient is set up with home health and will need a 3-in-1 at time of discharge.  Unit RNCM aware.    CSW signing off at this time.  Vickii PennaGina Dell Briner, LCSWA [phone]  Psychiatric & Orthopedics (5N 1-8) Clinical Social Worker

## 2015-03-04 NOTE — Progress Notes (Signed)
Physical Therapy Treatment Patient Details Name: Nicole [name] MRN: [id number] DOB: 1961-06-29 Today's Date: 03/04/2015    History of Present Illness 54 y.o. female s/p left total hip arthroplasty. History of hepatitis C and Human immunodeficiency virus (HIV) disease.    PT Comments    Focused on gait training and therapeutic exercises this afternoon. Patient min guard for majority of ambulation however required min assist for loss of balance on one occasion. Recalls 1/3 hip precautions despite multiple modalities of teaching. Will continue to reinforce and progress patient. Patient will continue to benefit from skilled physical therapy services to further improve independence with functional mobility.   Follow Up Recommendations  SNF     Equipment Recommendations  Rolling walker with 5" wheels;3in1 (PT)    Recommendations for Other Services       Precautions / Restrictions Precautions Precautions: Posterior Hip Precaution Comments: reviewed handout - recalls 1/3 precautions Restrictions Weight Bearing Restrictions: Yes LLE Weight Bearing: Weight bearing as tolerated    Mobility  Bed Mobility Overal bed mobility: Needs Assistance Bed Mobility: Sit to Supine       Sit to supine: Min assist   General bed mobility comments: min assist for LLE support into bed. Able to scoot up in bed without assist.  Transfers Overall transfer level: Needs assistance Equipment used: Rolling walker (2 wheeled) Transfers: Sit to/from Stand Sit to Stand: Min guard         General transfer comment: Min guard for safety. VC for hand placement, performed from recliner and BSC. No physical assist required.  Ambulation/Gait Ambulation/Gait assistance: Min assist Ambulation Distance (Feet): 80 Feet Assistive device: Rolling walker (2 wheeled) Gait Pattern/deviations: Step-to pattern;Step-through pattern;Decreased step length - left;Decreased stance time - left;Decreased stride  length;Antalgic;Trunk flexed;Narrow base of support Gait velocity: slow   General Gait Details: Improving posture from previous session however continues to require cues to facilitate fully upright position. Pt had one instances of stumbling requiring min assist to correct while turning but was min guard for majority of bout. Does not respond well to cues for wider base of support.   Stairs            Wheelchair Mobility    Modified Rankin (Stroke Patients Only)       Balance                                    Cognition Arousal/Alertness: Awake/alert Behavior During Therapy: WFL for tasks assessed/performed Overall Cognitive Status: Within Functional Limits for tasks assessed       Memory: Decreased recall of precautions;Decreased short-term memory              Exercises Total Joint Exercises Ankle Circles/Pumps: AROM;Both;10 reps;Seated Quad Sets: Strengthening;Both;10 reps;Seated Heel Slides: AAROM;Both;10 reps;Seated Hip ABduction/ADduction: AAROM;Both;10 reps;Seated Long Arc Quad: Strengthening;Left;10 reps;Seated (limited ROM at knee)    General Comments General comments (skin integrity, edema, etc.): Poor recall of precautions and has not been performing exercises prescribed on handout. Reviewed importance of compliance. Spoke with son who states pts sister is not a reliable source for supervision.      Pertinent Vitals/Pain Pain Assessment: Faces Pain Score:  ("feel ok" no value given) Faces Pain Scale: Hurts little more Pain Location: Lt hip Pain Descriptors / Indicators: Sore Pain Intervention(s): Monitored during session    Home Living  Prior Function            PT Goals (current goals can now be found in the care plan section) Acute Rehab PT Goals Patient Stated Goal: go to rehab PT Goal Formulation: With patient Time For Goal Achievement: 03/10/15 Potential to Achieve Goals: Good Progress  towards PT goals: Progressing toward goals    Frequency  7X/week    PT Plan Current plan remains appropriate    Co-evaluation             End of Session Equipment Utilized During Treatment: Gait belt Activity Tolerance: Patient tolerated treatment well Patient left: with call bell/phone within reach;in bed;with bed alarm set;with SCD's reapplied     Time: 1441-1506 PT Time Calculation (min) (ACUTE ONLY): 25 min  Charges:  $Gait Training: 8-22 mins $Therapeutic Exercise: 8-22 mins                    G Codes:      Berton MountBarbour, Eusevio Schriver S 03/04/2015, 3:43 PM Sunday [facility]ill[facility] [place], [place] [phone]

## 2015-03-04 NOTE — Progress Notes (Signed)
PATIENT ID: Nicole GraffCynthia S Golonka  MRN: [id number]  DOB/AGE:  06/05/1961 / 54 y.o.  2 Days Post-Op Procedure(s) (LRB): TOTAL HIP ARTHROPLASTY (Left)    PROGRESS NOTE Subjective: Patient is alert, oriented, no Nausea, no Vomiting, yes passing gas, no Bowel Movement. Taking PO well. Denies SOB, Chest or Calf Pain. Using Incentive Spirometer, PAS in place. Ambulate WBAT with pt walking 35 feet withtherpy Patient reports pain as 5 on 0-10 scale  .    Objective: Vital signs in last 24 hours: Filed Vitals:   03/03/15 2000 03/04/15 0000 03/04/15 0400 03/04/15 0544  BP:    107/55  Pulse:    94  Temp:    98 F (36.7 C)  TempSrc:      Resp: 18 18 18 16   Weight:      SpO2: 97% 97% 97% 98%      Intake/Output from previous day: I/O last 3 completed shifts: In: 2044.6 [P.O.:480; I.V.:1564.6] Out: 1100 [Urine:1100]   Intake/Output this shift:     LABORATORY DATA:  Recent Labs  03/02/15 1038 03/03/15 0830 03/04/15 0610  WBC  --  13.5* 17.7*  HGB 13.9 11.9* 11.6*  HCT 41.0 34.8* 32.5*  PLT  --  200 173  NA 142 135  --   K 3.8 3.7  --   CL  --  101  --   CO2  --  26  --   BUN  --  6  --   CREATININE  --  0.69  --   GLUCOSE 115* 157*  --   CALCIUM  --  8.5  --     Examination: Neurologically intact Neurovascular intact Sensation intact distally Intact pulses distally Dorsiflexion/Plantar flexion intact Incision: dressing C/D/I and scant drainage No cellulitis present Compartment soft} XR AP&Lat of hip shows well placed\fixed THA  Assessment:   2 Days Post-Op Procedure(s) (LRB): TOTAL HIP ARTHROPLASTY (Left) ADDITIONAL DIAGNOSIS:  Expected Acute Blood Loss Anemia, Diabetes, Hypertension and s/p head trauma age 21 with static motor weakness, Hep C, HIV.  Plan: PT/OT WBAT, THA  posterior precautions  DVT Prophylaxis: SCDx72 hrs, ASA 325 mg BID x 2 weeks  DISCHARGE PLAN: Home, likely tomorrow when pt passes therapy goals.  DISCHARGE NEEDS: HHPT, HHRN, Walker and 3-in-1  comode seat

## 2015-03-04 NOTE — Progress Notes (Signed)
Physical Therapy Treatment Patient Details Name: Nicole GraffCynthia S [name] MRN: [id number] DOB: [birth year] Today's Date: 03/04/2015    History of Present Illness 54 y.o. female s/p left total hip arthroplasty. History of hepatitis C and Human immunodeficiency virus (HIV) disease.    PT Comments    Continues to make steady but slow progress towards PT goals. Min guard for transfer today. Does not recall hip precautions or any of the exercises provided with handouts. Reviewed these again and expressed importance of compliance. Requires min assist for steady ambulation up to 65 feet today. Spoke with son who states patient's sister (roommate) is not a reliable source of supervision for his mother. Will continue to progress patient.  Follow Up Recommendations  SNF     Equipment Recommendations  Rolling walker with 5" wheels;3in1 (PT)    Recommendations for Other Services       Precautions / Restrictions Precautions Precautions: Posterior Hip Precaution Comments: reviewed handout - recalls 0/3 precautions Restrictions Weight Bearing Restrictions: Yes LLE Weight Bearing: Weight bearing as tolerated    Mobility  Bed Mobility               General bed mobility comments: Sitting in chair  Transfers Overall transfer level: Needs assistance Equipment used: Rolling walker (2 wheeled) Transfers: Sit to/from Stand Sit to Stand: Min guard         General transfer comment: Min guard for safety. VC to maintain hip precautions and to reach back for seat prior to sitting.  Ambulation/Gait Ambulation/Gait assistance: Min assist Ambulation Distance (Feet): 65 Feet Assistive device: Rolling walker (2 wheeled) Gait Pattern/deviations: Step-to pattern;Decreased step length - right;Decreased stance time - left;Decreased stride length;Antalgic;Trunk flexed;Narrow base of support Gait velocity: slow   General Gait Details: Continues to demonstrate difficulty abducting Rt hip and demonstrates  very narrow base of support. Frequent cues for upright posture which is short lived.  Moderately antalgic in Lt weight-bearing. no buckling noted but requires Min assist for stability.   Stairs            Wheelchair Mobility    Modified Rankin (Stroke Patients Only)       Balance                                    Cognition Arousal/Alertness: Awake/alert Behavior During Therapy: WFL for tasks assessed/performed Overall Cognitive Status: Within Functional Limits for tasks assessed       Memory: Decreased recall of precautions;Decreased short-term memory              Exercises Total Joint Exercises Ankle Circles/Pumps: AROM;Both;10 reps;Seated Long Arc Quad: Strengthening;Left;10 reps;Seated (limited ROM at knee)    General Comments General comments (skin integrity, edema, etc.): Poor recall of precautions and has not been performing exercises prescribed on handout. Reviewed importance of compliance. Spoke with son who states pts sister is not a reliable source for supervision.      Pertinent Vitals/Pain Pain Assessment: 0-10 Pain Score:  ("feel ok" no value given) Pain Location: Lt hip Pain Intervention(s): Monitored during session;Repositioned    Home Living                      Prior Function            PT Goals (current goals can now be found in the care plan section) Acute Rehab PT Goals Patient Stated Goal: go to rehab PT Goal  Formulation: With patient Time For Goal Achievement: 03/10/15 Potential to Achieve Goals: Good Progress towards PT goals: Progressing toward goals    Frequency  7X/week    PT Plan Current plan remains appropriate    Co-evaluation             End of Session Equipment Utilized During Treatment: Gait belt Activity Tolerance: Patient tolerated treatment well Patient left: with call bell/phone within reach;in chair     Time: 1150-1205 PT Time Calculation (min) (ACUTE ONLY): 15  min  Charges:  $Gait Training: 8-22 mins                    G Codes:      Berton Mount 2015-03-06, 12:56 PM Sunday Spillers [place], [place] [phone]

## 2015-03-05 LAB — CBC
HEMATOCRIT: 31.5 % — AB (ref 36.0–46.0)
Hemoglobin: 10.7 g/dL — ABNORMAL LOW (ref 12.0–15.0)
MCH: 30.1 pg (ref 26.0–34.0)
MCHC: 34 g/dL (ref 30.0–36.0)
MCV: 88.7 fL (ref 78.0–100.0)
PLATELETS: 192 10*3/uL (ref 150–400)
RBC: 3.55 MIL/uL — AB (ref 3.87–5.11)
RDW: 13.3 % (ref 11.5–15.5)
WBC: 13 10*3/uL — AB (ref 4.0–10.5)

## 2015-03-05 NOTE — Progress Notes (Signed)
   PATIENT ID: Bea Graffynthia S Seyller   3 Days Post-Op Procedure(s) (LRB): TOTAL HIP ARTHROPLASTY (Left)  Subjective: Doing well this am, minimal hip pain. Sitting in chair. Tells me she does not want to go home as her sister, who lives at home, is on dialysis and will not be around to help take care of her.   Objective:  Filed Vitals:   03/05/15 0634  BP: 110/64  Pulse: 97  Temp: 98.1 F (36.7 C)  Resp: 18      Neurologically intact Neurovascular intact Sensation intact distally Intact pulses distally Dorsiflexion/Plantar flexion intact Incision: dressing C/D/I and scant drainage No cellulitis present Compartment soft   Labs:   Recent Labs  03/03/15 0830 03/04/15 0610 03/05/15 0527  HGB 11.9* 11.6* 10.7*   Recent Labs  03/04/15 0610 03/05/15 0527  WBC 17.7* 13.0*  RBC 3.71* 3.55*  HCT 32.5* 31.5*  PLT 173 192   Recent Labs  03/03/15 0830  NA 135  K 3.7  CL 101  CO2 26  BUN 6  CREATININE 0.69  GLUCOSE 157*  CALCIUM 8.5    Assessment and Plan: 3 Days Post-Op Procedure(s) (LRB): TOTAL HIP ARTHROPLASTY (Left) ADDITIONAL DIAGNOSIS: Expected Acute Blood Loss Anemia, Diabetes, Hypertension and s/p head trauma age 54 with static motor weakness, Hep C, HIV.  Plan: PT/OT WBAT, THA posterior precautions  DVT Prophylaxis: SCDx72 hrs, ASA 325 mg BID x 2 weeks  DISCHARGE PLAN: now SNF, recommended per PT, likely Monday  DISCHARGE NEEDS: HHPT, HHRN, Walker and 3-in-1 comode seat

## 2015-03-05 NOTE — Progress Notes (Signed)
AM medications held d/t patient very sleepy, family asked to hold meds "so he can sleep".  Pt resting quietly, worked with PT, will administer medications.  Nursing to continue to monitor for status changes.   O2 sats dropped to 79 on room air during ambulation with PT.  Patient back on oxygen 3L via [place] sats 91-93%

## 2015-03-05 NOTE — Progress Notes (Signed)
Physical Therapy Treatment Patient Details Name: Bea GraffCynthia S Fajardo MRN: [id number] DOB: 12-03-1960 Today's Date: 03/05/2015    History of Present Illness 54 y.o. female s/p left total hip arthroplasty. History of hepatitis C and Human immunodeficiency virus (HIV) disease.    PT Comments    Pt is limited with mobility due to lethargy and fatigue. Pt would benefit from continued PT to increase functional independence and safety. Continue to recommend SNF per pt not having 24/7 care at home. Per chart, CSW has signed off, but charge nurse is aware that pt will need SNF. Pt also aware that she is unsafe to D/C home at this time, and stated that her plan was to D/C to SNF.  Follow Up Recommendations  SNF     Equipment Recommendations  Rolling walker with 5" wheels;3in1 (PT)    Recommendations for Other Services       Precautions / Restrictions Precautions Precautions: Posterior Hip Precaution Comments: Reviewed precautions. Pt able to recall 1/3 precautions. Required cues for hip flexion and rotation precautions. Restrictions Weight Bearing Restrictions: Yes LLE Weight Bearing: Weight bearing as tolerated    Mobility  Bed Mobility               General bed mobility comments: Pt in chair before and after.  Transfers Overall transfer level: Needs assistance Equipment used: Rolling walker (2 wheeled) Transfers: Sit to/from Stand Sit to Stand: Min guard         General transfer comment: Min guard for safety. Cues for hand placement and position of LLE.  Ambulation/Gait Ambulation/Gait assistance: Min assist Ambulation Distance (Feet): 60 Feet Assistive device: Rolling walker (2 wheeled) Gait Pattern/deviations: Step-to pattern;Decreased stance time - left;Decreased stride length;Antalgic;Trunk flexed   Gait velocity interpretation: Below normal speed for age/gender General Gait Details: Min A for balance. Cues for upright position and L heel strike.   Stairs             Wheelchair Mobility    Modified Rankin (Stroke Patients Only)       Balance                                    Cognition Arousal/Alertness: Lethargic;Suspect due to medications Behavior During Therapy: [facility]FL for tasks assessed/performed Overall Cognitive Status: Within Functional Limits for tasks assessed       Memory: Decreased recall of precautions;Decreased short-term memory              Exercises Total Joint Exercises Quad Sets: AROM;Both;10 reps Gluteal Sets: AROM;Both;10 reps Heel Slides: AAROM;Left;10 reps Hip ABduction/ADduction: AAROM;Left;10 reps Long Arc Quad: AAROM;Left;10 reps    General Comments        Pertinent Vitals/Pain Pain Assessment: No/denies pain Pain Intervention(s): Monitored during session    Home Living                      Prior Function            PT Goals (current goals can now be found in the care plan section) Acute Rehab PT Goals Patient Stated Goal: go to rehab Progress towards PT goals: Progressing toward goals    Frequency  7X/week    PT Plan Current plan remains appropriate    Co-evaluation             End of Session Equipment Utilized During Treatment: Gait belt Activity Tolerance: Patient tolerated treatment well Patient left:  in chair;with call bell/phone within reach     Time: 1034-1057 PT Time Calculation (min) (ACUTE ONLY): 23 min  Charges:                       G CodesLeonard Schwartz, SPTA 03/05/2015, 11:12 AM

## 2015-03-06 MED ORDER — BISACODYL 10 MG RE SUPP
10.0000 mg | Freq: Once | RECTAL | Status: AC
  Administered 2015-03-06: 10 mg via RECTAL
  Filled 2015-03-06: qty 1

## 2015-03-06 NOTE — Progress Notes (Signed)
   PATIENT ID: Nicole [name]   4 Days Post-Op Procedure(s) (LRB): TOTAL HIP ARTHROPLASTY (Left)  Subjective: Doing well this am. Up in chair. Minimal pain left hip. Happy about d/c plan to SNF likely tomorrow.   Objective:  Filed Vitals:   03/06/15 0800  BP:   Pulse:   Temp:   Resp: 20     Neurologically intact Neurovascular intact Sensation intact distally Intact pulses distally Dorsiflexion/Plantar flexion intact Incision: dressing C/D/I and scant drainage No cellulitis present Compartment soft  Labs:   Recent Labs  03/04/15 0610 03/05/15 0527  HGB 11.6* 10.7*   Recent Labs  03/04/15 0610 03/05/15 0527  WBC 17.7* 13.0*  RBC 3.71* 3.55*  HCT 32.5* 31.5*  PLT 173 192  No results for input(s): NA, K, CL, CO2, BUN, CREATININE, GLUCOSE, CALCIUM in the last 72 hours.  Assessment and Plan: PT/OT WBAT, THA posterior precautions  DVT Prophylaxis: SCDx72 hrs, ASA 325 mg BID x 2 weeks  DISCHARGE PLAN: now SNF, SW consulted, recommended per PT, likely Monday  DISCHARGE NEEDS: HHPT, HHRN, Walker and 3-in-1 comode seat

## 2015-03-06 NOTE — Clinical Social Work Note (Signed)
Clinical Social Work Assessment  Patient Details  Name: Nicole [name] MRN: [id number] Date of Birth: 04/12/1961  Date of referral:  03/06/15               Reason for consult:  Facility Placement, Discharge Planning                Permission sought to share information with:  Facility Sport and exercise psychologist, Family Supports Permission granted to share information::  Yes, Verbal Permission Granted  Name::     Nicole [name]   Agency::  [facility]   Relationship::  sister listed above   Sport and exercise psychologist Information:  [phone]  Housing/Transportation Living arrangements for the past 2 months:  Single Family Home Source of Information:  Patient, Other (Comment Required) (Sister ) Patient Interpreter Needed:  None Criminal Activity/Legal Involvement Pertinent to Current Situation/Hospitalization:  No - Comment as needed Significant Relationships:  Siblings Lives with:  Siblings Do you feel safe going back to the place where you live?  Yes Need for family participation in patient care:  Yes (Comment)  Care giving concerns: None  Social Worker assessment / plan: CSW met with the Pt and sister at the bedside. CSW introduced self and reason for consult. Pt was aware of the need for placement in SNF for rehabilitation. Pt stated that she has never been to a facility. Pt stated awareness of possible d/c tomorrow. Pt was encouraged to choose a first choice. Pt stated that she would choose [facility] due to Pt sister and Pt aware of facility. CSW was given permission to [employer] area and proceed to assist with d/c planning.    Employment status:  Disabled (Comment on whether or not currently receiving Disability) Insurance information:  Medicare, Medicaid [place] PT Recommendations:  [place] / Referral to community resources:  [place]  Patient/Family's Response to care:  Pt/Family were appreciative for assistance with d/c planning.   Patient/Family's Understanding of and Emotional Response to Diagnosis, Current Treatment, and Prognosis:  Pt realizes it will take some time at facility for recovery and is willing to participate fully with PT at facility.   Emotional Assessment Appearance:  Developmentally appropriate Attitude/Demeanor/Rapport:    Affect (typically observed):  Accepting, Appropriate, Stable Orientation:  Oriented to Self, Oriented to Place, Oriented to  Time, Oriented to Situation Alcohol / Substance use:  Not Applicable Psych involvement (Current and /or in the community):  No (Comment)  Discharge Needs  Concerns to be addressed:  Discharge Planning Concerns Readmission within the last 30 days:  No Current discharge risk:  None Barriers to Discharge:  No Barriers Identified   Pete Pelt 03/06/2015, 5:33 PM

## 2015-03-06 NOTE — Progress Notes (Signed)
Physical Therapy Treatment Patient Details Name: Bea GraffCynthia S Gothard MRN: [id number] DOB: 09/20/1961 Today's Date: 03/06/2015    History of Present Illness 54 y.o. female s/p left total hip arthroplasty. History of hepatitis C and Human immunodeficiency virus (HIV) disease.    PT Comments    Patient up in the recliner upon arrival. More alert this morning. Able to increase ambulation slightly and had improved balance with gait. Patient does have L shoe she typically wears when walking but it is at home. She will try to get it here but more than likely will have it at rehab. Continue to recommend SNF for ongoing Physical Therapy.     Follow Up Recommendations  SNF     Equipment Recommendations  Rolling walker with 5" wheels;3in1 (PT)    Recommendations for Other Services       Precautions / Restrictions Precautions Precautions: Posterior Hip Precaution Comments: Reviewed precautions. Pt able to recall 1/3 precautions. Required cues for hip flexion and rotation precautions. Restrictions LLE Weight Bearing: Weight bearing as tolerated    Mobility  Bed Mobility               General bed mobility comments: Pt in chair before and after.  Transfers Overall transfer level: Needs assistance Equipment used: Rolling walker (2 wheeled)   Sit to Stand: Min guard         General transfer comment: Min guard for safety. Cues for hand placement and position of LLE.  Ambulation/Gait Ambulation/Gait assistance: Min guard Ambulation Distance (Feet): 80 Feet Assistive device: Rolling walker (2 wheeled) Gait Pattern/deviations: Step-to pattern;Trunk flexed;Decreased step length - right;Decreased stance time - left   Gait velocity interpretation: Below normal speed for age/gender General Gait Details: Patient with controlled balance this session. Patient stated that she has walked on her toes for years now and does not have L shoe. Will have someone try to bring tomorrow   Stairs             Wheelchair Mobility    Modified Rankin (Stroke Patients Only)       Balance                                    Cognition Arousal/Alertness: Awake/alert Behavior During Therapy: WFL for tasks assessed/performed Overall Cognitive Status: Within Functional Limits for tasks assessed                      Exercises Total Joint Exercises Quad Sets: AROM;Both;10 reps Gluteal Sets: AROM;Both;10 reps Heel Slides: AAROM;Left;10 reps Hip ABduction/ADduction: AAROM;Left;10 reps Long Arc Quad: AAROM;Left;10 reps    General Comments        Pertinent Vitals/Pain Pain Assessment: No/denies pain    Home Living                      Prior Function            PT Goals (current goals can now be found in the care plan section) Progress towards PT goals: Progressing toward goals    Frequency  7X/week    PT Plan Current plan remains appropriate    Co-evaluation             End of Session   Activity Tolerance: Patient tolerated treatment well Patient left: in chair;with call bell/phone within reach     Time: 1044-1100 PT Time Calculation (min) (ACUTE ONLY): 16 min  Charges:  $Gait Training: 8-22 mins                    G Codes:      Fredrich Birks 03/06/2015, 12:06 PM  03/06/2015 Fredrich Birks PTA [phone] pager [phone] office

## 2015-03-07 ENCOUNTER — Encounter (HOSPITAL_COMMUNITY): Payer: Self-pay | Admitting: Orthopedic Surgery

## 2015-03-07 DIAGNOSIS — D62 Acute posthemorrhagic anemia: Secondary | ICD-10-CM | POA: Diagnosis not present

## 2015-03-07 DIAGNOSIS — M1990 Unspecified osteoarthritis, unspecified site: Secondary | ICD-10-CM | POA: Diagnosis not present

## 2015-03-07 DIAGNOSIS — D72829 Elevated white blood cell count, unspecified: Secondary | ICD-10-CM | POA: Diagnosis not present

## 2015-03-07 DIAGNOSIS — Z96642 Presence of left artificial hip joint: Secondary | ICD-10-CM | POA: Diagnosis not present

## 2015-03-07 DIAGNOSIS — Z21 Asymptomatic human immunodeficiency virus [HIV] infection status: Secondary | ICD-10-CM | POA: Diagnosis not present

## 2015-03-07 DIAGNOSIS — M6281 Muscle weakness (generalized): Secondary | ICD-10-CM | POA: Diagnosis not present

## 2015-03-07 DIAGNOSIS — Z471 Aftercare following joint replacement surgery: Secondary | ICD-10-CM | POA: Diagnosis not present

## 2015-03-07 DIAGNOSIS — B20 Human immunodeficiency virus [HIV] disease: Secondary | ICD-10-CM | POA: Diagnosis not present

## 2015-03-07 DIAGNOSIS — E876 Hypokalemia: Secondary | ICD-10-CM | POA: Diagnosis not present

## 2015-03-07 DIAGNOSIS — R269 Unspecified abnormalities of gait and mobility: Secondary | ICD-10-CM | POA: Diagnosis not present

## 2015-03-07 DIAGNOSIS — R278 Other lack of coordination: Secondary | ICD-10-CM | POA: Diagnosis not present

## 2015-03-07 DIAGNOSIS — M62838 Other muscle spasm: Secondary | ICD-10-CM | POA: Diagnosis not present

## 2015-03-07 DIAGNOSIS — J309 Allergic rhinitis, unspecified: Secondary | ICD-10-CM | POA: Diagnosis not present

## 2015-03-07 DIAGNOSIS — I10 Essential (primary) hypertension: Secondary | ICD-10-CM | POA: Diagnosis not present

## 2015-03-07 DIAGNOSIS — R2681 Unsteadiness on feet: Secondary | ICD-10-CM | POA: Diagnosis not present

## 2015-03-07 NOTE — Progress Notes (Signed)
Report called to RN at [facility], all questions answered.

## 2015-03-07 NOTE — Discharge Planning (Signed)
Patient will discharge today per MD order. [facility] - [facility]) RN to call report prior to transportation to: [phone] Transportation: [facility]  CSW sent discharge summary to SNF for review.  Packet is complete.  RN and patient aware of discharge plans.  Vickii PennaGina Kaeli Nichelson, LCSWA [phone]) [phone]  Psychiatric & Orthopedics (5N 1-16) Clinical Social Worker

## 2015-03-07 NOTE — Progress Notes (Signed)
Bea Graffynthia S Leadbetter to be D/C'd Skilled nursing facility per MD order. Discussed with the patient and all questions fully answered.    Medication List    TAKE these medications        aspirin EC 325 MG tablet  Take 1 tablet (325 mg total) by mouth 2 (two) times daily.     diclofenac sodium 1 % Gel  Commonly known as:  VOLTAREN  Apply 2 g topically 4 (four) times daily as needed.     efavirenz-emtricitabine-tenofovir 600-200-300 MG per tablet  Commonly known as:  ATRIPLA  Take 1 tablet by mouth daily.     Fish Oil 300 MG Caps  Take 3 capsules (900 mg total) by mouth daily.     fluticasone 50 MCG/ACT nasal spray  Commonly known as:  FLONASE  Place 1 spray into the nose 2 (two) times daily.     lisinopril-hydrochlorothiazide 20-25 MG per tablet  Commonly known as:  PRINZIDE,ZESTORETIC  Take 1 tablet by mouth daily.     methocarbamol 500 MG tablet  Commonly known as:  ROBAXIN  Take 1 tablet (500 mg total) by mouth 2 (two) times daily with a meal.     oxyCODONE-acetaminophen 5-325 MG per tablet  Commonly known as:  ROXICET  Take 1 tablet by mouth every 4 (four) hours as needed.     Potassium Chloride ER 20 MEQ Tbcr  Take 40 mEq by mouth daily.        VVS, Skin clean, dry and intact without evidence of skin break down, no evidence of skin tears noted.  IV catheter discontinued intact. Site without signs and symptoms of complications. Dressing and pressure applied.  An After Visit Summary was printed and given to the patient.  Patient escorted via WC, and D/C SNF via private auto.  Kai LevinsJacobs, Montasia Chisenhall N  03/07/2015 2:47 PM

## 2015-03-07 NOTE — Progress Notes (Signed)
PATIENT ID: Nicole GraffCynthia S [name]  MRN: [id number]  DOB/AGE:  December 30, 1960 / 54 y.o.  5 Days Post-Op Procedure(s) (LRB): TOTAL HIP ARTHROPLASTY (Left)    PROGRESS NOTE Subjective: Patient is alert, oriented, no Nausea, no Vomiting, yes passing gas, yes Bowel Movement. Taking PO Well. Denies SOB, Chest or Calf Pain. Using Incentive Spirometer, PAS in place. Ambulate WBAT with pt walking 80 ft with therapy Patient reports pain as 6 on 0-10 scale  .    Objective: Vital signs in last 24 hours: Filed Vitals:   03/06/15 1200 03/06/15 1351 03/06/15 2002 03/07/15 0451  BP:  114/64 122/80 101/65  Pulse:  97 102 89  Temp:  99.5 F (37.5 C) 98.6 F (37 C) 98.5 F (36.9 C)  TempSrc:   Oral Oral  Resp: 16 16 17 18   Weight:      SpO2: 100% 100% 100% 100%      Intake/Output from previous day: I/O last 3 completed shifts: In: 600 [P.O.:600] Out: -    Intake/Output this shift:     LABORATORY DATA:  Recent Labs  03/05/15 0527  WBC 13.0*  HGB 10.7*  HCT 31.5*  PLT 192    Examination: Neurologically intact Neurovascular intact Sensation intact distally Intact pulses distally Dorsiflexion/Plantar flexion intact Incision: scant drainage No cellulitis present Compartment soft} XR AP&Lat of hip shows well placed\fixed THA  Assessment:   5 Days Post-Op Procedure(s) (LRB): TOTAL HIP ARTHROPLASTY (Left) ADDITIONAL DIAGNOSIS:  Expected Acute Blood Loss Anemia, Diabetes, Hypertension and s/p head trauma age 89 with static motor weakness, Hep C, HIV.  Plan: PT/OT WBAT, THA  posterior precautions  DVT Prophylaxis: SCDx72 hrs, ASA 325 mg BID x 2 weeks  DISCHARGE PLAN: Skilled Nursing Facility/Rehab, when bed available  DISCHARGE NEEDS: HHPT, HHRN, Walker and 3-in-1 comode seat

## 2015-03-07 NOTE — Progress Notes (Signed)
Physical Therapy Treatment Patient Details Name: Bea GraffCynthia S Lollar MRN: [id number] DOB: [birth year] Today's Date: 03/07/2015    History of Present Illness 54 y.o. female s/p left total hip arthroplasty. History of hepatitis C and Human immunodeficiency virus (HIV) disease.    PT Comments    Pt is making progress towards goals and increasing functional independence. She would benefit from continued PT to further increase functional independence and safety. Continue to recommend SNF for ongoing therapy.  Follow Up Recommendations  SNF     Equipment Recommendations  Rolling walker with 5" wheels;3in1 (PT)    Recommendations for Other Services       Precautions / Restrictions Precautions Precautions: Posterior Hip Precaution Comments: Reviewed precautions. Pt able to recall 1/3 precautions. Required cues for hip flexion and rotation precautions. Restrictions Weight Bearing Restrictions: Yes LLE Weight Bearing: Weight bearing as tolerated    Mobility  Bed Mobility               General bed mobility comments: Pt seen EOB and in chair after.  Transfers Overall transfer level: Needs assistance Equipment used: Rolling walker (2 wheeled) Transfers: Sit to/from Stand Sit to Stand: Min guard         General transfer comment: Min guard for safety. Cues for hand placement and position of LLE.  Ambulation/Gait Ambulation/Gait assistance: Min guard Ambulation Distance (Feet): 100 Feet Assistive device: Rolling walker (2 wheeled) Gait Pattern/deviations: Step-through pattern;Trunk flexed;Decreased stance time - left   Gait velocity interpretation: Below normal speed for age/gender General Gait Details: Min guard for safety. Cues for upright posture.   Stairs            Wheelchair Mobility    Modified Rankin (Stroke Patients Only)       Balance                                    Cognition Arousal/Alertness: Awake/alert Behavior During Therapy:  WFL for tasks assessed/performed Overall Cognitive Status: Within Functional Limits for tasks assessed       Memory: Decreased recall of precautions              Exercises Total Joint Exercises Quad Sets: AROM;Left;10 reps Gluteal Sets: AROM;Both;10 reps Heel Slides: AAROM;Left;10 reps Hip ABduction/ADduction: AAROM;Left;10 reps Long Arc Quad: AAROM;Left;10 reps    General Comments        Pertinent Vitals/Pain Pain Assessment: No/denies pain Pain Intervention(s): Monitored during session;Repositioned    Home Living                      Prior Function            PT Goals (current goals can now be found in the care plan section) Progress towards PT goals: Progressing toward goals    Frequency  7X/week    PT Plan Current plan remains appropriate    Co-evaluation             End of Session Equipment Utilized During Treatment: Gait belt Activity Tolerance: Patient tolerated treatment well Patient left: in chair;with call bell/phone within reach     Time: 1010-1030 PT Time Calculation (min) (ACUTE ONLY): 20 min  Charges:                       G CodesLeonard Schwartz:      Tamula Morrical, SPTA 03/07/2015, 10:39 AM
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
## 2015-03-07 NOTE — Clinical Social Work Placement (Signed)
   CLINICAL SOCIAL WORK PLACEMENT  NOTE  Date:  03/07/2015  Patient Details  Name: Nicole [name] MRN: [id number] Date of Birth: 01-12-1961  Clinical Social Work is seeking post-discharge placement for this patient at the Skilled  Nursing Facility level of care (*CSW will initial, date and re-position this form in  chart as items are completed):  Yes   Patient/family provided with [facility] Clinical Social Work Department's list of facilities offering this level of care within the geographic area requested by the patient (or if unable, by the patient's family).  Yes   Patient/family informed of their freedom to choose among providers that offer the needed level of care, that participate in Medicare, Medicaid or managed care program needed by the patient, have an available bed and are willing to accept the patient.  Yes   Patient/family informed of [facility]'s ownership interest in [facility] and [facility], as well as of the fact that they are under no obligation to receive care at these facilities.  PASRR submitted to EDS on 03/06/15     PASRR number received on 03/06/15     Existing PASRR number confirmed on       FL2 transmitted to all facilities in geographic area requested by pt/family on 03/06/15     FL2 transmitted to all facilities within larger geographic area on       Patient informed that his/her managed care company has contracts with or will negotiate with certain facilities, including the following:        No   Patient/family informed of bed offers received.  [facility]     Physician recommends and patient chooses bed at  (none)    Patient to be transferred to  ([facility]) on 03/07/15.  Patient to be transferred to facility by  Sharin Mons(PTAR)     Patient family notified on 03/07/15 of transfer.  Name of family member notified:  RNCM notified patient's daughter.  Patient is alert and oriented x4.     PHYSICIAN Please sign FL2      Additional Comment:    _______________________________________________ Rondel BatonIngle, Carely Nappier C, LCSW 03/07/2015, 12:07 PM

## 2015-03-08 ENCOUNTER — Encounter: Payer: Self-pay | Admitting: Registered Nurse

## 2015-03-08 ENCOUNTER — Non-Acute Institutional Stay (SKILLED_NURSING_FACILITY): Payer: Medicare Other | Admitting: Registered Nurse

## 2015-03-08 DIAGNOSIS — Z21 Asymptomatic human immunodeficiency virus [HIV] infection status: Secondary | ICD-10-CM | POA: Diagnosis not present

## 2015-03-08 DIAGNOSIS — M1990 Unspecified osteoarthritis, unspecified site: Secondary | ICD-10-CM

## 2015-03-08 DIAGNOSIS — B20 Human immunodeficiency virus [HIV] disease: Secondary | ICD-10-CM

## 2015-03-08 DIAGNOSIS — D72829 Elevated white blood cell count, unspecified: Secondary | ICD-10-CM

## 2015-03-08 DIAGNOSIS — I10 Essential (primary) hypertension: Secondary | ICD-10-CM | POA: Diagnosis not present

## 2015-03-08 DIAGNOSIS — D62 Acute posthemorrhagic anemia: Secondary | ICD-10-CM | POA: Diagnosis not present

## 2015-03-08 DIAGNOSIS — J309 Allergic rhinitis, unspecified: Secondary | ICD-10-CM | POA: Diagnosis not present

## 2015-03-08 DIAGNOSIS — M1612 Unilateral primary osteoarthritis, left hip: Secondary | ICD-10-CM
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
## 2015-03-09 ENCOUNTER — Non-Acute Institutional Stay (SKILLED_NURSING_FACILITY): Payer: Medicare Other | Admitting: Internal Medicine

## 2015-03-09 DIAGNOSIS — M1990 Unspecified osteoarthritis, unspecified site: Secondary | ICD-10-CM

## 2015-03-09 DIAGNOSIS — I10 Essential (primary) hypertension: Secondary | ICD-10-CM | POA: Diagnosis not present

## 2015-03-09 DIAGNOSIS — M1610 Unilateral primary osteoarthritis, unspecified hip: Secondary | ICD-10-CM

## 2015-03-09 DIAGNOSIS — D72829 Elevated white blood cell count, unspecified: Secondary | ICD-10-CM

## 2015-03-09 DIAGNOSIS — D62 Acute posthemorrhagic anemia: Secondary | ICD-10-CM | POA: Diagnosis not present

## 2015-03-09 DIAGNOSIS — B20 Human immunodeficiency virus [HIV] disease: Secondary | ICD-10-CM

## 2015-03-09 DIAGNOSIS — E876 Hypokalemia: Secondary | ICD-10-CM
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
## 2015-03-11 ENCOUNTER — Other Ambulatory Visit: Payer: Self-pay | Admitting: *Deleted

## 2015-03-11 MED ORDER — OXYCODONE-ACETAMINOPHEN 5-325 MG PO TABS: ORAL_TABLET | ORAL | Status: DC

## 2015-03-11 NOTE — Telephone Encounter (Signed)
[facility] 

## 2015-03-14 ENCOUNTER — Non-Acute Institutional Stay (SKILLED_NURSING_FACILITY): Payer: Medicare Other | Admitting: Registered Nurse

## 2015-03-14 ENCOUNTER — Encounter: Payer: Self-pay | Admitting: Registered Nurse

## 2015-03-14 DIAGNOSIS — M1612 Unilateral primary osteoarthritis, left hip: Secondary | ICD-10-CM

## 2015-03-14 DIAGNOSIS — B20 Human immunodeficiency virus [HIV] disease: Secondary | ICD-10-CM | POA: Diagnosis not present

## 2015-03-14 DIAGNOSIS — M1990 Unspecified osteoarthritis, unspecified site: Secondary | ICD-10-CM | POA: Diagnosis not present

## 2015-03-14 DIAGNOSIS — J309 Allergic rhinitis, unspecified: Secondary | ICD-10-CM

## 2015-03-14 DIAGNOSIS — D62 Acute posthemorrhagic anemia: Secondary | ICD-10-CM

## 2015-03-14 DIAGNOSIS — I10 Essential (primary) hypertension: Secondary | ICD-10-CM | POA: Diagnosis not present
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
## 2015-03-16 DIAGNOSIS — Z96649 Presence of unspecified artificial hip joint: Secondary | ICD-10-CM | POA: Diagnosis not present

## 2015-03-16 DIAGNOSIS — Z96642 Presence of left artificial hip joint: Secondary | ICD-10-CM | POA: Diagnosis not present

## 2015-03-16 DIAGNOSIS — Z471 Aftercare following joint replacement surgery: Secondary | ICD-10-CM | POA: Diagnosis not present

## 2015-03-16 DIAGNOSIS — M25552 Pain in left hip: Secondary | ICD-10-CM | POA: Diagnosis not present

## 2015-03-16 DIAGNOSIS — R262 Difficulty in walking, not elsewhere classified: Secondary | ICD-10-CM | POA: Diagnosis not present

## 2015-03-21 DIAGNOSIS — R262 Difficulty in walking, not elsewhere classified: Secondary | ICD-10-CM | POA: Diagnosis not present

## 2015-03-21 DIAGNOSIS — M25552 Pain in left hip: Secondary | ICD-10-CM | POA: Diagnosis not present

## 2015-03-21 DIAGNOSIS — Z96642 Presence of left artificial hip joint: Secondary | ICD-10-CM | POA: Diagnosis not present

## 2015-03-23 DIAGNOSIS — Z96642 Presence of left artificial hip joint: Secondary | ICD-10-CM | POA: Diagnosis not present

## 2015-03-23 DIAGNOSIS — R262 Difficulty in walking, not elsewhere classified: Secondary | ICD-10-CM | POA: Diagnosis not present

## 2015-03-23 DIAGNOSIS — M25552 Pain in left hip: Secondary | ICD-10-CM | POA: Diagnosis not present

## 2015-03-28 DIAGNOSIS — M25552 Pain in left hip: Secondary | ICD-10-CM | POA: Diagnosis not present

## 2015-03-28 DIAGNOSIS — Z96642 Presence of left artificial hip joint: Secondary | ICD-10-CM | POA: Diagnosis not present

## 2015-03-28 DIAGNOSIS — R262 Difficulty in walking, not elsewhere classified: Secondary | ICD-10-CM | POA: Diagnosis not present

## 2015-03-30 DIAGNOSIS — R262 Difficulty in walking, not elsewhere classified: Secondary | ICD-10-CM | POA: Diagnosis not present

## 2015-03-30 DIAGNOSIS — M25552 Pain in left hip: Secondary | ICD-10-CM | POA: Diagnosis not present

## 2015-03-30 DIAGNOSIS — Z96642 Presence of left artificial hip joint: Secondary | ICD-10-CM | POA: Diagnosis not present

## 2015-04-04 DIAGNOSIS — M25552 Pain in left hip: Secondary | ICD-10-CM | POA: Diagnosis not present

## 2015-04-04 DIAGNOSIS — R262 Difficulty in walking, not elsewhere classified: Secondary | ICD-10-CM | POA: Diagnosis not present

## 2015-04-04 DIAGNOSIS — Z96642 Presence of left artificial hip joint: Secondary | ICD-10-CM | POA: Diagnosis not present

## 2015-04-06 DIAGNOSIS — Z96642 Presence of left artificial hip joint: Secondary | ICD-10-CM | POA: Diagnosis not present

## 2015-04-06 DIAGNOSIS — M25552 Pain in left hip: Secondary | ICD-10-CM | POA: Diagnosis not present

## 2015-04-06 DIAGNOSIS — R262 Difficulty in walking, not elsewhere classified: Secondary | ICD-10-CM | POA: Diagnosis not present

## 2015-04-13 DIAGNOSIS — M25552 Pain in left hip: Secondary | ICD-10-CM | POA: Diagnosis not present

## 2015-04-13 DIAGNOSIS — R262 Difficulty in walking, not elsewhere classified: Secondary | ICD-10-CM | POA: Diagnosis not present

## 2015-04-13 DIAGNOSIS — Z96642 Presence of left artificial hip joint: Secondary | ICD-10-CM | POA: Diagnosis not present

## 2015-04-18 DIAGNOSIS — M25552 Pain in left hip: Secondary | ICD-10-CM | POA: Diagnosis not present

## 2015-04-18 DIAGNOSIS — Z96642 Presence of left artificial hip joint: Secondary | ICD-10-CM | POA: Diagnosis not present

## 2015-04-18 DIAGNOSIS — R262 Difficulty in walking, not elsewhere classified: Secondary | ICD-10-CM | POA: Diagnosis not present

## 2015-04-20 DIAGNOSIS — R262 Difficulty in walking, not elsewhere classified: Secondary | ICD-10-CM | POA: Diagnosis not present

## 2015-04-20 DIAGNOSIS — Z96642 Presence of left artificial hip joint: Secondary | ICD-10-CM | POA: Diagnosis not present

## 2015-04-20 DIAGNOSIS — M25552 Pain in left hip: Secondary | ICD-10-CM | POA: Diagnosis not present

## 2015-04-21 ENCOUNTER — Other Ambulatory Visit: Payer: Self-pay | Admitting: Internal Medicine

## 2015-04-22 NOTE — Telephone Encounter (Signed)
Spoke w/ pt, she is taking both, scheduled lab appt next week, please place the order, she states she needs the meloxicam but hasnt had any in 1 month

## 2015-04-25 ENCOUNTER — Other Ambulatory Visit (INDEPENDENT_AMBULATORY_CARE_PROVIDER_SITE_OTHER): Payer: Medicare Other

## 2015-04-25 ENCOUNTER — Other Ambulatory Visit: Payer: Self-pay | Admitting: Internal Medicine

## 2015-04-25 DIAGNOSIS — I10 Essential (primary) hypertension: Secondary | ICD-10-CM

## 2015-04-25 DIAGNOSIS — B20 Human immunodeficiency virus [HIV] disease: Secondary | ICD-10-CM

## 2015-04-25 LAB — LIPID PANEL
CHOL/HDL RATIO: 2.6 ratio
CHOLESTEROL: 167 mg/dL (ref 0–200)
HDL: 64 mg/dL (ref >=46)
LDL Cholesterol: 86 mg/dL (ref 0–99)
Triglycerides: 86 mg/dL (ref <150)
VLDL: 17 mg/dL (ref 0–40)

## 2015-04-25 LAB — COMPLETE METABOLIC PANEL WITH GFR
ALK PHOS: 85 U/L (ref 39–117)
ALT: 10 U/L (ref 0–35)
AST: 14 U/L (ref 0–37)
Albumin: 3.8 g/dL (ref 3.5–5.2)
BILIRUBIN TOTAL: 0.3 mg/dL (ref 0.2–1.2)
BUN: 10 mg/dL (ref 6–23)
CHLORIDE: 106 meq/L (ref 96–112)
CO2: 26 mEq/L (ref 19–32)
CREATININE: 0.64 mg/dL (ref 0.50–1.10)
Calcium: 9.1 mg/dL (ref 8.4–10.5)
GFR, Est African American: >89 mL/min
GFR, Est Non African American: >89 mL/min
Glucose, Bld: 126 mg/dL — ABNORMAL HIGH (ref 70–99)
Potassium: 3.5 mEq/L (ref 3.5–5.3)
Sodium: 142 mEq/L (ref 135–145)
Total Protein: 6.9 g/dL (ref 6.0–8.3)

## 2015-04-25 LAB — CBC
HEMATOCRIT: 39.2 % (ref 36.0–46.0)
HEMOGLOBIN: 13 g/dL (ref 12.0–15.0)
MCH: 30.5 pg (ref 26.0–34.0)
MCHC: 33.2 g/dL (ref 30.0–36.0)
MCV: 92 fL (ref 78.0–100.0)
MPV: 10.4 fL (ref 8.6–12.4)
PLATELETS: 236 10*3/uL (ref 150–400)
RBC: 4.26 MIL/uL (ref 3.87–5.11)
RDW: 13.9 % (ref 11.5–15.5)
WBC: 7.3 10*3/uL (ref 4.0–10.5)

## 2015-04-25 MED ORDER — METHOCARBAMOL 500 MG PO TABS: 500.0000 mg | ORAL_TABLET | Freq: Two times a day (BID) | ORAL | Status: DC

## 2015-04-25 MED ORDER — POTASSIUM CHLORIDE ER 20 MEQ PO TBCR: 40.0000 meq | EXTENDED_RELEASE_TABLET | Freq: Every day | ORAL | Status: DC

## 2015-04-26 LAB — HIV-1 RNA QUANT-NO REFLEX-BLD
HIV 1 RNA Quant: 193 copies/mL — ABNORMAL HIGH (ref <20)
HIV-1 RNA Quant, Log: 2.29 {Log} — ABNORMAL HIGH (ref <1.30)

## 2015-04-26 LAB — RPR

## 2015-04-26 LAB — T-HELPER CELLS (CD4) COUNT (NOT AT ARMC)
CD4 % Helper T Cell: 44 % (ref 33–55)
CD4 T Cell Abs: 1350 /uL (ref 400–2700)

## 2015-05-05 DIAGNOSIS — Z96649 Presence of unspecified artificial hip joint: Secondary | ICD-10-CM | POA: Diagnosis not present

## 2015-05-05 DIAGNOSIS — Z471 Aftercare following joint replacement surgery: Secondary | ICD-10-CM | POA: Diagnosis not present

## 2015-05-09 DIAGNOSIS — R262 Difficulty in walking, not elsewhere classified: Secondary | ICD-10-CM | POA: Diagnosis not present

## 2015-05-09 DIAGNOSIS — Z96642 Presence of left artificial hip joint: Secondary | ICD-10-CM | POA: Diagnosis not present

## 2015-05-09 DIAGNOSIS — M25552 Pain in left hip: Secondary | ICD-10-CM | POA: Diagnosis not present

## 2015-05-11 DIAGNOSIS — M25552 Pain in left hip: Secondary | ICD-10-CM | POA: Diagnosis not present

## 2015-05-11 DIAGNOSIS — Z96642 Presence of left artificial hip joint: Secondary | ICD-10-CM | POA: Diagnosis not present

## 2015-05-11 DIAGNOSIS — R262 Difficulty in walking, not elsewhere classified: Secondary | ICD-10-CM | POA: Diagnosis not present

## 2015-05-18 DIAGNOSIS — Z96642 Presence of left artificial hip joint: Secondary | ICD-10-CM | POA: Diagnosis not present

## 2015-05-18 DIAGNOSIS — M25552 Pain in left hip: Secondary | ICD-10-CM | POA: Diagnosis not present

## 2015-05-18 DIAGNOSIS — R262 Difficulty in walking, not elsewhere classified: Secondary | ICD-10-CM | POA: Diagnosis not present

## 2015-05-20 ENCOUNTER — Other Ambulatory Visit: Payer: Self-pay | Admitting: Internal Medicine

## 2015-05-20 ENCOUNTER — Telehealth: Payer: Self-pay | Admitting: Internal Medicine

## 2015-05-20 DIAGNOSIS — I10 Essential (primary) hypertension: Secondary | ICD-10-CM

## 2015-05-20 MED ORDER — LISINOPRIL-HYDROCHLOROTHIAZIDE 20-12.5 MG PO TABS: 1.0000 | ORAL_TABLET | Freq: Every day | ORAL | Status: DC

## 2015-05-20 NOTE — Telephone Encounter (Signed)
I called and spoke with patient about BP med change due to hypokalemia.  Her K was 3.5 one month ago and she says she was not on K supplement at that time.  I prescribed K 40mEq at that time but she says she never received it from the pharmacy.  Since K was only low end of normal one month ago I think we can hold off on starting supplement and instead decrease HCTZ component of Prinzide to 12.5mg .  I let patient know the plan and asked that she discard current Prinzide 20-25 when she picks up new Prinzide 20-12.5mg .  She will see me in clinic on 05/26/15.

## 2015-05-23 DIAGNOSIS — Z96642 Presence of left artificial hip joint: Secondary | ICD-10-CM | POA: Diagnosis not present

## 2015-05-23 DIAGNOSIS — M25552 Pain in left hip: Secondary | ICD-10-CM | POA: Diagnosis not present

## 2015-05-23 DIAGNOSIS — R262 Difficulty in walking, not elsewhere classified: Secondary | ICD-10-CM | POA: Diagnosis not present

## 2015-05-25 ENCOUNTER — Telehealth: Payer: Self-pay | Admitting: Internal Medicine

## 2015-05-25 DIAGNOSIS — Z96642 Presence of left artificial hip joint: Secondary | ICD-10-CM | POA: Diagnosis not present

## 2015-05-25 DIAGNOSIS — R262 Difficulty in walking, not elsewhere classified: Secondary | ICD-10-CM | POA: Diagnosis not present

## 2015-05-25 DIAGNOSIS — M25552 Pain in left hip: Secondary | ICD-10-CM | POA: Diagnosis not present

## 2015-05-25 NOTE — Telephone Encounter (Signed)
Call to patient to confirm appointment for 05/26/15 at 10:45 lmtcb

## 2015-05-26 ENCOUNTER — Encounter: Payer: Self-pay | Admitting: Internal Medicine

## 2015-05-26 ENCOUNTER — Ambulatory Visit (INDEPENDENT_AMBULATORY_CARE_PROVIDER_SITE_OTHER): Payer: Medicare Other | Admitting: Internal Medicine

## 2015-05-26 VITALS — BP 122/74 | HR 99 | Temp 97.8°F | Ht 66.0 in | Wt 240.4 lb

## 2015-05-26 DIAGNOSIS — Z1239 Encounter for other screening for malignant neoplasm of breast: Secondary | ICD-10-CM

## 2015-05-26 DIAGNOSIS — F17210 Nicotine dependence, cigarettes, uncomplicated: Secondary | ICD-10-CM

## 2015-05-26 DIAGNOSIS — Z9889 Other specified postprocedural states: Secondary | ICD-10-CM

## 2015-05-26 DIAGNOSIS — M1612 Unilateral primary osteoarthritis, left hip: Secondary | ICD-10-CM

## 2015-05-26 DIAGNOSIS — I10 Essential (primary) hypertension: Secondary | ICD-10-CM

## 2015-05-26 DIAGNOSIS — E876 Hypokalemia: Secondary | ICD-10-CM

## 2015-05-26 DIAGNOSIS — Z21 Asymptomatic human immunodeficiency virus [HIV] infection status: Secondary | ICD-10-CM | POA: Diagnosis not present

## 2015-05-26 DIAGNOSIS — B20 Human immunodeficiency virus [HIV] disease: Secondary | ICD-10-CM

## 2015-05-26 DIAGNOSIS — Z0000 Encounter for general adult medical examination without abnormal findings: Secondary | ICD-10-CM

## 2015-05-26 DIAGNOSIS — F17200 Nicotine dependence, unspecified, uncomplicated: Secondary | ICD-10-CM

## 2015-05-26 HISTORY — DX: Hypokalemia: E87.6

## 2015-05-26 LAB — BASIC METABOLIC PANEL WITH GFR
BUN: 16 mg/dL (ref 6–23)
CHLORIDE: 106 meq/L (ref 96–112)
CO2: 22 mEq/L (ref 19–32)
CREATININE: 0.74 mg/dL (ref 0.50–1.10)
Calcium: 8.8 mg/dL (ref 8.4–10.5)
GFR, Est African American: >89 mL/min
GFR, Est Non African American: >89 mL/min
Glucose, Bld: 72 mg/dL (ref 70–99)
Potassium: 3.4 mEq/L — ABNORMAL LOW (ref 3.5–5.3)
Sodium: 144 mEq/L (ref 135–145)

## 2015-05-26 LAB — MAGNESIUM: MAGNESIUM: 1.7 mg/dL (ref 1.5–2.5)

## 2015-05-26 NOTE — Assessment & Plan Note (Signed)
  Assessment: Progress toward smoking cessation:   unchanged, still smoking 1 cig/day Barriers to progress toward smoking cessation:   habit Comments: I advised her to pick a quit date and stop.  Change AM routine to avoid smoking cigarette in the AM.    Plan: Instruction/counseling given:  I counseled patient on the dangers of tobacco use, advised patient to stop smoking, and reviewed strategies to maximize success. Educational resources provided:    Self management tools provided:    Medications to assist with smoking cessation:  Nicotine Patch Patient agreed to the following self-care plans for smoking cessation:    Other plans: Continue to address at future visits.

## 2015-05-26 NOTE — Patient Instructions (Addendum)
1. I will call you if there are problems with your labs.   2. Please take all medications as prescribed.    3. If you have worsening of your symptoms or new symptoms arise, please call the clinic [phone]), or go to the ER immediately if symptoms are severe.   Return to see me in 3 months.

## 2015-05-26 NOTE — Assessment & Plan Note (Signed)
Referral for mammo today 

## 2015-05-26 NOTE — Assessment & Plan Note (Signed)
S/p left hip surgery in April 2016.  Going to PT twice weekly and feels she is progressing well.  Saw ortho for follow-up last month and will see him again Aug 3.

## 2015-05-26 NOTE — Assessment & Plan Note (Signed)
BP Readings from Last 3 Encounters:  05/26/15 122/74  03/14/15 119/81  03/09/15 114/63    Lab Results  Component Value Date   NA 142 04/25/2015   K 3.5 04/25/2015   CREATININE 0.64 04/25/2015    Assessment: Blood pressure control:  well controlled Progress toward BP goal:   at goal Comments: Compliant with Prinzide 20-12.5mg  daily (startred lower dose HCTZ last week due to low K)  Plan: Medications:  continue current medications:  Prinzide 20-12.5mg  daily Educational resources provided:   Self management tools provided:   Other plans: RCT in 3 months.

## 2015-05-26 NOTE — Assessment & Plan Note (Signed)
Nicole [name] noted to be low in April 2016 and she was supplemented. She has not had supplement in about 8 weeks since d/c from SNF.  Nicole [name] was 3.5 four weeks ago (not a Nicole [name] supplement at that time).  I re-prescribed Nicole [name] at that time but the patient said she did not know about it so she never picked it up.  I called her 1 week ago and reduced BP med (Prinzide) so the HCTZ dose is now 12.5mg  instead of 25mg .  I will see if Nicole [name] is better today with reduced HCTZ.
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
## 2015-05-26 NOTE — Assessment & Plan Note (Addendum)
Previously with undetectable VL but most recent is 193.  CD4 count is normal. Compliant with Atripla.  Followed by Dr. Orvan Falconerampbell.  She will see him next week.

## 2015-05-27 ENCOUNTER — Other Ambulatory Visit: Payer: Self-pay | Admitting: Internal Medicine

## 2015-05-27 DIAGNOSIS — E876 Hypokalemia: Secondary | ICD-10-CM

## 2015-05-27 MED ORDER — POTASSIUM CHLORIDE ER 20 MEQ PO TBCR: 20.0000 meq | EXTENDED_RELEASE_TABLET | Freq: Every day | ORAL | Status: DC

## 2015-05-30 DIAGNOSIS — R262 Difficulty in walking, not elsewhere classified: Secondary | ICD-10-CM | POA: Diagnosis not present

## 2015-05-30 DIAGNOSIS — Z96642 Presence of left artificial hip joint: Secondary | ICD-10-CM | POA: Diagnosis not present

## 2015-05-30 DIAGNOSIS — M25552 Pain in left hip: Secondary | ICD-10-CM | POA: Diagnosis not present

## 2015-05-30 NOTE — Progress Notes (Signed)
Internal Medicine Clinic Attending  Case discussed with Dr. Wilson soon after the resident saw the patient.  We reviewed the resident's history and exam and pertinent patient test results.  I agree with the assessment, diagnosis, and plan of care documented in the resident's note.  

## 2015-05-31 ENCOUNTER — Other Ambulatory Visit: Payer: Self-pay | Admitting: *Deleted

## 2015-05-31 ENCOUNTER — Encounter: Payer: Self-pay | Admitting: Internal Medicine

## 2015-05-31 ENCOUNTER — Ambulatory Visit (INDEPENDENT_AMBULATORY_CARE_PROVIDER_SITE_OTHER): Payer: Medicare Other | Admitting: Internal Medicine

## 2015-05-31 DIAGNOSIS — B20 Human immunodeficiency virus [HIV] disease: Secondary | ICD-10-CM | POA: Diagnosis present

## 2015-05-31 MED ORDER — ELVITEG-COBIC-EMTRICIT-TENOFAF 150-150-200-10 MG PO TABS: 1.0000 | ORAL_TABLET | Freq: Every day | ORAL | Status: DC
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
## 2015-06-01 DIAGNOSIS — Z96642 Presence of left artificial hip joint: Secondary | ICD-10-CM | POA: Diagnosis not present

## 2015-06-01 DIAGNOSIS — M25552 Pain in left hip: Secondary | ICD-10-CM | POA: Diagnosis not present

## 2015-06-01 DIAGNOSIS — R262 Difficulty in walking, not elsewhere classified: Secondary | ICD-10-CM | POA: Diagnosis not present

## 2015-06-15 DIAGNOSIS — Z96642 Presence of left artificial hip joint: Secondary | ICD-10-CM | POA: Diagnosis not present

## 2015-06-15 DIAGNOSIS — M25552 Pain in left hip: Secondary | ICD-10-CM | POA: Diagnosis not present

## 2015-06-15 DIAGNOSIS — Z471 Aftercare following joint replacement surgery: Secondary | ICD-10-CM | POA: Diagnosis not present

## 2015-06-20 ENCOUNTER — Other Ambulatory Visit: Payer: Self-pay | Admitting: *Deleted

## 2015-06-20 DIAGNOSIS — B20 Human immunodeficiency virus [HIV] disease: Secondary | ICD-10-CM

## 2015-06-20 MED ORDER — ELVITEG-COBIC-EMTRICIT-TENOFAF 150-150-200-10 MG PO TABS: 1.0000 | ORAL_TABLET | Freq: Every day | ORAL | Status: DC

## 2015-07-08 ENCOUNTER — Other Ambulatory Visit: Payer: Self-pay | Admitting: Internal Medicine

## 2015-07-13 ENCOUNTER — Other Ambulatory Visit: Payer: Medicare Other

## 2015-07-13 DIAGNOSIS — B20 Human immunodeficiency virus [HIV] disease: Secondary | ICD-10-CM

## 2015-07-14 LAB — HIV-1 RNA QUANT-NO REFLEX-BLD
HIV 1 RNA Quant: 40 copies/mL — ABNORMAL HIGH (ref <20)
HIV-1 RNA QUANT, LOG: 1.6 {Log} — AB (ref <1.30)

## 2015-08-02 ENCOUNTER — Ambulatory Visit: Payer: Medicare Other | Admitting: Internal Medicine

## 2015-08-09 ENCOUNTER — Other Ambulatory Visit: Payer: Self-pay | Admitting: Internal Medicine

## 2015-08-11 NOTE — Telephone Encounter (Signed)
Called pharm

## 2015-08-31 DIAGNOSIS — Z09 Encounter for follow-up examination after completed treatment for conditions other than malignant neoplasm: Secondary | ICD-10-CM | POA: Diagnosis not present

## 2015-08-31 DIAGNOSIS — M25551 Pain in right hip: Secondary | ICD-10-CM | POA: Diagnosis not present

## 2015-08-31 DIAGNOSIS — Z96642 Presence of left artificial hip joint: Secondary | ICD-10-CM | POA: Diagnosis not present

## 2015-09-05 DIAGNOSIS — M7631 Iliotibial band syndrome, right leg: Secondary | ICD-10-CM | POA: Diagnosis not present

## 2015-09-07 ENCOUNTER — Ambulatory Visit: Payer: Medicare Other | Admitting: Internal Medicine

## 2015-09-08 ENCOUNTER — Other Ambulatory Visit: Payer: Self-pay | Admitting: Internal Medicine

## 2015-09-12 DIAGNOSIS — M7631 Iliotibial band syndrome, right leg: Secondary | ICD-10-CM | POA: Diagnosis not present

## 2015-09-14 DIAGNOSIS — M7631 Iliotibial band syndrome, right leg: Secondary | ICD-10-CM | POA: Diagnosis not present

## 2015-09-19 DIAGNOSIS — M7631 Iliotibial band syndrome, right leg: Secondary | ICD-10-CM | POA: Diagnosis not present

## 2015-09-20 ENCOUNTER — Ambulatory Visit: Payer: Medicare Other | Admitting: Internal Medicine

## 2015-09-21 ENCOUNTER — Ambulatory Visit (INDEPENDENT_AMBULATORY_CARE_PROVIDER_SITE_OTHER): Payer: Medicare Other | Admitting: Internal Medicine

## 2015-09-21 ENCOUNTER — Other Ambulatory Visit: Payer: Self-pay | Admitting: Internal Medicine

## 2015-09-21 ENCOUNTER — Encounter: Payer: Self-pay | Admitting: Internal Medicine

## 2015-09-21 VITALS — BP 114/65 | HR 82 | Temp 98.2°F | Ht 66.0 in | Wt 250.0 lb

## 2015-09-21 DIAGNOSIS — E876 Hypokalemia: Secondary | ICD-10-CM | POA: Diagnosis not present

## 2015-09-21 DIAGNOSIS — Z1239 Encounter for other screening for malignant neoplasm of breast: Secondary | ICD-10-CM

## 2015-09-21 DIAGNOSIS — Z1231 Encounter for screening mammogram for malignant neoplasm of breast: Secondary | ICD-10-CM

## 2015-09-21 DIAGNOSIS — I10 Essential (primary) hypertension: Secondary | ICD-10-CM | POA: Diagnosis present

## 2015-09-21 DIAGNOSIS — Z720 Tobacco use: Secondary | ICD-10-CM

## 2015-09-21 DIAGNOSIS — B20 Human immunodeficiency virus [HIV] disease: Secondary | ICD-10-CM

## 2015-09-21 DIAGNOSIS — Z0000 Encounter for general adult medical examination without abnormal findings: Secondary | ICD-10-CM

## 2015-09-21 DIAGNOSIS — F17200 Nicotine dependence, unspecified, uncomplicated: Secondary | ICD-10-CM

## 2015-09-21 DIAGNOSIS — Z23 Encounter for immunization: Secondary | ICD-10-CM | POA: Diagnosis not present

## 2015-09-21 NOTE — Assessment & Plan Note (Signed)
BP Readings from Last 3 Encounters:  09/21/15 114/65  05/31/15 126/81  05/26/15 122/74    Lab Results  Component Value Date   NA 144 05/26/2015   K 3.4* 05/26/2015   CREATININE 0.74 05/26/2015    Assessment: Blood pressure control:  well controlled Progress toward BP goal:   at goal Comments: Compliant with medication.  Tolerating w/o significant ADR (she has been slightly hypokalemic).  Weight is up about 10 pounds in past 4 months.  Plan: Medications:  continue current medications:  Lisinopril-HCTZ 20-12.5mg  daily Educational resources provided:   Self management tools provided:   Other plans:  Work on incorporating 30 min walking/light exercise on most days of the week.  RTC in 6 months.

## 2015-09-21 NOTE — Assessment & Plan Note (Addendum)
Assessment:  Compliant with K supplement and HCTZ dose lowered a few months ago. Plan:  check K today and adjust medications as needed

## 2015-09-21 NOTE — Assessment & Plan Note (Signed)
Atripla --> Genvoya 4 months ago.  No ADRs.  She will see Dr. Orvan Falconerampbell next week for follow-up.

## 2015-09-21 NOTE — Patient Instructions (Signed)
1. Someone will call you to arrange the sleep study that was ordered a few months ago.     2. Please take all medications as prescribed.    3. If you have worsening of your symptoms or new symptoms arise, please call the clinic [phone]), or go to the ER immediately if symptoms are severe.   Please return to see me in 4-6 months.

## 2015-09-21 NOTE — Assessment & Plan Note (Signed)
Flu shot given today. mammo referral placed.

## 2015-09-21 NOTE — Assessment & Plan Note (Signed)
  Assessment: Progress toward smoking cessation:    deteriorated; smoking more - up to 1 pack q3days (she used to smoke 1 cig/day). Barriers to progress toward smoking cessation:   habit Comments: She says she plans to stop on her own after the new year.  She initially declines smoking cessation assistance (patch, Zyban) but then accepts patch.  Plan: Instruction/counseling given:  I counseled patient on the dangers of tobacco use, advised patient to stop smoking, and reviewed strategies to maximize success. Educational resources provided:    Self management tools provided:    Medications to assist with smoking cessation:  Nicotine Patch and gum Patient agreed to the following self-care plans for smoking cessation: set a quit date and stop smoking  Other plans: RTC in 4-6 months for follow-up.
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
## 2015-09-22 LAB — BMP8+ANION GAP
ANION GAP: 16 mmol/L (ref 10.0–18.0)
BUN/Creatinine Ratio: 18 (ref 9–23)
BUN: 17 mg/dL (ref 6–24)
CALCIUM: 9.4 mg/dL (ref 8.7–10.2)
CO2: 23 mmol/L (ref 18–29)
CREATININE: 0.93 mg/dL (ref 0.57–1.00)
Chloride: 104 mmol/L (ref 97–106)
GFR calc Af Amer: 79 mL/min/{1.73_m2} (ref >59)
GFR, EST NON AFRICAN AMERICAN: 69 mL/min/{1.73_m2} (ref >59)
Glucose: 141 mg/dL — ABNORMAL HIGH (ref 65–99)
Potassium: 3.9 mmol/L (ref 3.5–5.2)
Sodium: 143 mmol/L (ref 136–144)

## 2015-09-22 MED ORDER — NICOTINE POLACRILEX 2 MG MT GUM: 2.0000 mg | CHEWING_GUM | OROMUCOSAL | Status: DC | PRN

## 2015-09-22 MED ORDER — NICOTINE 14 MG/24HR TD PT24: 14.0000 mg | MEDICATED_PATCH | TRANSDERMAL | Status: DC

## 2015-09-22 NOTE — Addendum Note (Signed)
Addended by: Yolanda MangesWILSON, Bobby Barton M on: 09/22/2015 06:24 PM   Modules accepted: Orders

## 2015-09-26 ENCOUNTER — Encounter: Payer: Self-pay | Admitting: Internal Medicine

## 2015-09-26 ENCOUNTER — Telehealth: Payer: Self-pay | Admitting: *Deleted

## 2015-09-26 ENCOUNTER — Ambulatory Visit (INDEPENDENT_AMBULATORY_CARE_PROVIDER_SITE_OTHER): Payer: Medicare Other | Admitting: Internal Medicine

## 2015-09-26 VITALS — BP 138/84 | HR 88 | Temp 98.3°F | Wt 245.5 lb

## 2015-09-26 DIAGNOSIS — M7631 Iliotibial band syndrome, right leg: Secondary | ICD-10-CM | POA: Diagnosis not present

## 2015-09-26 DIAGNOSIS — B20 Human immunodeficiency virus [HIV] disease: Secondary | ICD-10-CM

## 2015-09-26 DIAGNOSIS — Z79899 Other long term (current) drug therapy: Secondary | ICD-10-CM

## 2015-09-26 NOTE — Telephone Encounter (Signed)
Call to patient concerning results of labs per order of Dr. Andrey CampanileWilson.  Message left that Clinics had called and for patient to call the Clinics.  Angelina OkGladys Herbin, RN 09/26/2015 4:39 PM
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
## 2015-09-26 NOTE — Assessment & Plan Note (Signed)
Her adherence remains excellent and her infection is under better control with Genvoya. She will follow-up after lab work in 6 months.

## 2015-09-27 NOTE — Telephone Encounter (Signed)
RTC from patient given message that her labs were normal and to continue all meds including her Potassium.  Patient voiced understanding of the plan.  Angelina OkGladys Herbin, RN 09/27/2015 8:56 AM

## 2015-09-28 ENCOUNTER — Other Ambulatory Visit: Payer: Self-pay | Admitting: Internal Medicine

## 2015-09-28 DIAGNOSIS — M7631 Iliotibial band syndrome, right leg: Secondary | ICD-10-CM | POA: Diagnosis not present

## 2015-10-03 DIAGNOSIS — M7631 Iliotibial band syndrome, right leg: Secondary | ICD-10-CM | POA: Diagnosis not present

## 2015-10-03 NOTE — Progress Notes (Signed)
Case discussed with Dr. Wilson soon after the resident saw the patient. We reviewed the resident's history and exam and pertinent patient test results. I agree with the assessment, diagnosis, and plan of care documented in the resident's note. 

## 2015-10-04 ENCOUNTER — Encounter: Payer: Self-pay | Admitting: Student

## 2015-10-05 ENCOUNTER — Other Ambulatory Visit: Payer: Self-pay | Admitting: Internal Medicine

## 2015-10-05 DIAGNOSIS — I10 Essential (primary) hypertension: Secondary | ICD-10-CM

## 2015-10-05 MED ORDER — LISINOPRIL-HYDROCHLOROTHIAZIDE 20-12.5 MG PO TABS: 1.0000 | ORAL_TABLET | Freq: Every day | ORAL | Status: DC

## 2015-10-28 ENCOUNTER — Ambulatory Visit
Admission: RE | Admit: 2015-10-28 | Discharge: 2015-10-28 | Disposition: A | Discharge status: Home / Self Care | Payer: Medicare Other | Source: Ambulatory Visit | Attending: Internal Medicine | Admitting: Internal Medicine

## 2015-10-28 DIAGNOSIS — Z1231 Encounter for screening mammogram for malignant neoplasm of breast: Secondary | ICD-10-CM | POA: Diagnosis not present

## 2015-11-04 ENCOUNTER — Other Ambulatory Visit: Payer: Self-pay | Admitting: Internal Medicine

## 2015-11-18 ENCOUNTER — Telehealth: Payer: Self-pay | Admitting: Internal Medicine

## 2015-11-18 NOTE — Telephone Encounter (Signed)
Amy from [facility] called requesting the nurse to call regarding Potassium Chloride. Please call back.

## 2015-11-22 ENCOUNTER — Other Ambulatory Visit: Payer: Self-pay | Admitting: *Deleted

## 2015-11-22 NOTE — Telephone Encounter (Signed)
Spoke w/ a pharmacist, just asking for a refill of K+, request sent again to dr Andrey Campanilewilson

## 2015-11-23 MED ORDER — POTASSIUM CHLORIDE CRYS ER 20 MEQ PO TBCR: EXTENDED_RELEASE_TABLET | ORAL | Status: DC

## 2015-11-28 ENCOUNTER — Telehealth: Payer: Self-pay | Admitting: *Deleted
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
## 2015-11-30 ENCOUNTER — Other Ambulatory Visit: Payer: Self-pay | Admitting: Internal Medicine

## 2015-11-30 NOTE — Telephone Encounter (Signed)
This has been sent 1/11 electronically by dr Andrey Campanile

## 2015-11-30 NOTE — Telephone Encounter (Signed)
Pt requesting potassium chloride to be filled.

## 2016-01-09 ENCOUNTER — Ambulatory Visit (HOSPITAL_BASED_OUTPATIENT_CLINIC_OR_DEPARTMENT_OTHER): Payer: Medicare Other | Attending: Internal Medicine | Admitting: Radiology

## 2016-01-09 DIAGNOSIS — Z6835 Body mass index (BMI) 35.0-35.9, adult: Secondary | ICD-10-CM | POA: Insufficient documentation

## 2016-01-09 DIAGNOSIS — G4733 Obstructive sleep apnea (adult) (pediatric): Secondary | ICD-10-CM | POA: Insufficient documentation

## 2016-01-09 DIAGNOSIS — R0683 Snoring: Secondary | ICD-10-CM | POA: Diagnosis not present

## 2016-01-09 DIAGNOSIS — Z79899 Other long term (current) drug therapy: Secondary | ICD-10-CM | POA: Insufficient documentation

## 2016-01-09 DIAGNOSIS — Z9189 Other specified personal risk factors, not elsewhere classified: Secondary | ICD-10-CM

## 2016-01-09 DIAGNOSIS — E669 Obesity, unspecified: Secondary | ICD-10-CM | POA: Insufficient documentation

## 2016-01-14 DIAGNOSIS — R0683 Snoring: Secondary | ICD-10-CM | POA: Diagnosis not present

## 2016-01-14 NOTE — Progress Notes (Signed)
  Patient Name: Nicole [name], Saidah Study Date: 01/09/2016 Gender: Female D.O.B: October 02, 1961 Age (years): 54 Referring Provider: Debe CoderEmily Mullen Height (inches): 66 Interpreting Physician: Jetty Duhamellinton Derricka Mertz MD, ABSM Weight (lbs): 218 RPSGT: Shelah LewandowskyGregory, Kenyon BMI: 35 MRN: [id number] Neck Size: 14.00 CLINICAL INFORMATION Sleep Study Type: NPSG Indication for sleep study: Hypertension, Obesity, OSA, Snoring Epworth Sleepiness Score: 8  SLEEP STUDY TECHNIQUE As per the AASM Manual for the Scoring of Sleep and Associated Events v2.3 (April 2016) with a hypopnea requiring 4% desaturations. The channels recorded and monitored were frontal, central and occipital EEG, electrooculogram (EOG), submentalis EMG (chin), nasal and oral airflow, thoracic and abdominal wall motion, anterior tibialis EMG, snore microphone, electrocardiogram, and pulse oximetry.  MEDICATIONS Patient's medications include: charted for review Medications self-administered by patient during sleep study : OMEGA-3 FATTY ACIDS.  SLEEP ARCHITECTURE The study was initiated at 9:43:51 PM and ended at 4:44:56 AM. Sleep onset time was 11.8 minutes and the sleep efficiency was 85.5%. The total sleep time was 360.0 minutes. Stage REM latency was 88.0 minutes. The patient spent 11.67% of the night in stage N1 sleep, 69.58% in stage N2 sleep, 0.00% in stage N3 and 18.75% in REM. Alpha intrusion was absent. Supine sleep was 7.78%. Wake after sleep onset 49 minutes  RESPIRATORY PARAMETERS The overall apnea/hypopnea index (AHI) was 0.3 per hour. There were 0 total apneas, including 0 obstructive, 0 central and 0 mixed apneas. There were 2 hypopneas and 4 RERAs. The AHI during Stage REM sleep was 1.8 per hour. AHI while supine was 2.1 per hour. The mean oxygen saturation was 96.94%. The minimum SpO2 during sleep was 93.00%. Loud snoring was noted during this study.  CARDIAC DATA The 2 lead EKG demonstrated sinus rhythm. The mean heart rate  was 78.15 beats per minute. Other EKG findings include: None.  LEG MOVEMENT DATA The total PLMS were 13 with a resulting PLMS index of 2.17. Associated arousal with leg movement index was 1.3 .  IMPRESSIONS - No significant obstructive sleep apnea occurred during this study (AHI = 0.3/h). - No significant central sleep apnea occurred during this study (CAI = 0.0/h). - The patient had minimal or no oxygen desaturation during the study (Min O2 = 93.00%) - The patient snored with Loud snoring volume. - No cardiac abnormalities were noted during this study. - Clinically significant periodic limb movements did not occur during sleep. No significant associated arousals.  DIAGNOSIS - Primary Snoring (786.09 [R06.83 ICD-10]) - Normal study  RECOMMENDATIONS - Avoid alcohol, sedatives and other CNS depressants that may worsen sleep apnea and disrupt normal sleep architecture. - Sleep hygiene should be reviewed to assess factors that may improve sleep quality. - Weight management and regular exercise should be initiated or continued if appropriate.  Waymon BudgeYOUNG,Michal Strzelecki D Diplomate, American Board of Sleep Medicine  ELECTRONICALLY SIGNED ON:  01/14/2016, 9:56 AM [facility] PH: [phone]   FX: [phone] ACCREDITED BY THE AMERICAN ACADEMY OF SLEEP MEDICINE

## 2016-01-20 ENCOUNTER — Telehealth: Payer: Self-pay | Admitting: Internal Medicine

## 2016-01-20 NOTE — Telephone Encounter (Signed)
Pt requesting sleep study result. Please call pt back.

## 2016-01-20 NOTE — Telephone Encounter (Signed)
[facility] Andrey Camp[facility]ileWilson[facility]

## 2016-01-23 NOTE — Telephone Encounter (Signed)
Called patient to advise her sleep study results normal per Dr. Andrey CampanileWilson.  Had to LVM

## 2016-01-23 NOTE — Telephone Encounter (Signed)
Can you please let her know it was a normal sleep study? Thank you.

## 2016-01-26 ENCOUNTER — Telehealth: Payer: Self-pay | Admitting: Internal Medicine

## 2016-01-26 NOTE — Telephone Encounter (Signed)
Pt would like sleep study results

## 2016-01-26 NOTE — Telephone Encounter (Signed)
Called pt, lm for rtc 

## 2016-01-30 ENCOUNTER — Other Ambulatory Visit: Payer: Self-pay | Admitting: *Deleted

## 2016-01-30 MED ORDER — POTASSIUM CHLORIDE CRYS ER 20 MEQ PO TBCR: EXTENDED_RELEASE_TABLET | ORAL | Status: DC

## 2016-01-30 NOTE — Telephone Encounter (Signed)
Pt rtc, gave her sleep results, she ask for refill of K+

## 2016-02-03 ENCOUNTER — Other Ambulatory Visit: Payer: Self-pay | Admitting: Internal Medicine

## 2016-02-06 NOTE — Telephone Encounter (Signed)
I prescribed voltaren gel last year for OA pain.  Nicole [name] has since had surgery and she may not need the gel any longer.  Since this was an automated rx request can you please call the patient and ask if she still needs the gel?  Otherwise, no need to refill.

## 2016-02-06 NOTE — Telephone Encounter (Signed)
This hasn't been refilled since April of last year. Dr. Andrey CampanileWilson should be back today. Would defer to her if this medication should be refilled

## 2016-02-16 ENCOUNTER — Other Ambulatory Visit: Payer: Self-pay | Admitting: *Deleted

## 2016-02-16 DIAGNOSIS — I10 Essential (primary) hypertension: Secondary | ICD-10-CM

## 2016-02-16 NOTE — Telephone Encounter (Signed)
Pt states she does not need voltaren gel

## 2016-02-20 NOTE — Telephone Encounter (Signed)
Checked with pharm and pt does have med, closed enc

## 2016-02-29 DIAGNOSIS — Z96652 Presence of left artificial knee joint: Secondary | ICD-10-CM | POA: Diagnosis not present

## 2016-02-29 DIAGNOSIS — Z09 Encounter for follow-up examination after completed treatment for conditions other than malignant neoplasm: Secondary | ICD-10-CM | POA: Diagnosis not present

## 2016-02-29 DIAGNOSIS — Z96642 Presence of left artificial hip joint: Secondary | ICD-10-CM | POA: Diagnosis not present

## 2016-03-12 ENCOUNTER — Other Ambulatory Visit: Payer: Medicare Other

## 2016-03-13 ENCOUNTER — Other Ambulatory Visit: Payer: Medicare Other

## 2016-03-14 ENCOUNTER — Other Ambulatory Visit: Payer: Medicare Other

## 2016-03-14 DIAGNOSIS — B20 Human immunodeficiency virus [HIV] disease: Secondary | ICD-10-CM | POA: Diagnosis not present

## 2016-03-14 DIAGNOSIS — Z79899 Other long term (current) drug therapy: Secondary | ICD-10-CM

## 2016-03-14 LAB — CBC
HEMATOCRIT: 41.1 % (ref 35.0–45.0)
Hemoglobin: 13.5 g/dL (ref 11.7–15.5)
MCH: 29.9 pg (ref 27.0–33.0)
MCHC: 32.8 g/dL (ref 32.0–36.0)
MCV: 91.1 fL (ref 80.0–100.0)
MPV: 11.4 fL (ref 7.5–12.5)
PLATELETS: 189 10*3/uL (ref 140–400)
RBC: 4.51 MIL/uL (ref 3.80–5.10)
RDW: 14.4 % (ref 11.0–15.0)
WBC: 7.6 10*3/uL (ref 3.8–10.8)

## 2016-03-14 LAB — COMPREHENSIVE METABOLIC PANEL
ALK PHOS: 57 U/L (ref 33–130)
ALT: 11 U/L (ref 6–29)
AST: 14 U/L (ref 10–35)
Albumin: 4.2 g/dL (ref 3.6–5.1)
BUN: 13 mg/dL (ref 7–25)
CALCIUM: 9.8 mg/dL (ref 8.6–10.4)
CHLORIDE: 105 mmol/L (ref 98–110)
CO2: 25 mmol/L (ref 20–31)
Creat: 0.71 mg/dL (ref 0.50–1.05)
GLUCOSE: 119 mg/dL — AB (ref 65–99)
POTASSIUM: 3.7 mmol/L (ref 3.5–5.3)
Sodium: 139 mmol/L (ref 135–146)
TOTAL PROTEIN: 7.2 g/dL (ref 6.1–8.1)
Total Bilirubin: 0.4 mg/dL (ref 0.2–1.2)

## 2016-03-14 LAB — LIPID PANEL
CHOL/HDL RATIO: 2.5 ratio (ref <=5.0)
CHOLESTEROL: 167 mg/dL (ref 125–200)
HDL: 68 mg/dL (ref >=46)
LDL Cholesterol: 85 mg/dL (ref <130)
Triglycerides: 68 mg/dL (ref <150)
VLDL: 14 mg/dL (ref <30)

## 2016-03-15 LAB — RPR

## 2016-03-15 LAB — T-HELPER CELL (CD4) - (RCID CLINIC ONLY)
CD4 % Helper T Cell: 44 % (ref 33–55)
CD4 T CELL ABS: 1300 /uL (ref 400–2700)

## 2016-03-15 LAB — HIV-1 RNA QUANT-NO REFLEX-BLD: HIV-1 RNA Quant, Log: <1.3 Log copies/mL (ref <1.30)

## 2016-03-27 ENCOUNTER — Ambulatory Visit: Payer: Medicare Other | Admitting: Internal Medicine

## 2016-04-25 ENCOUNTER — Encounter: Payer: Self-pay | Admitting: *Deleted

## 2016-05-02 ENCOUNTER — Ambulatory Visit (INDEPENDENT_AMBULATORY_CARE_PROVIDER_SITE_OTHER): Payer: Medicare Other | Admitting: Internal Medicine

## 2016-05-02 ENCOUNTER — Encounter: Payer: Self-pay | Admitting: Internal Medicine

## 2016-05-02 VITALS — BP 139/84 | HR 69 | Temp 97.8°F | Ht 66.0 in | Wt 267.4 lb

## 2016-05-02 DIAGNOSIS — F17200 Nicotine dependence, unspecified, uncomplicated: Secondary | ICD-10-CM

## 2016-05-02 DIAGNOSIS — E876 Hypokalemia: Secondary | ICD-10-CM

## 2016-05-02 DIAGNOSIS — Z21 Asymptomatic human immunodeficiency virus [HIV] infection status: Secondary | ICD-10-CM | POA: Diagnosis not present

## 2016-05-02 DIAGNOSIS — F17210 Nicotine dependence, cigarettes, uncomplicated: Secondary | ICD-10-CM | POA: Diagnosis not present

## 2016-05-02 DIAGNOSIS — B20 Human immunodeficiency virus [HIV] disease: Secondary | ICD-10-CM

## 2016-05-02 DIAGNOSIS — I10 Essential (primary) hypertension: Secondary | ICD-10-CM

## 2016-05-02 DIAGNOSIS — Z0000 Encounter for general adult medical examination without abnormal findings: Secondary | ICD-10-CM

## 2016-05-02 MED ORDER — NICOTINE 7 MG/24HR TD PT24: 7.0000 mg | MEDICATED_PATCH | Freq: Every day | TRANSDERMAL | Status: DC

## 2016-05-02 MED ORDER — NICOTINE POLACRILEX 2 MG MT GUM: 2.0000 mg | CHEWING_GUM | OROMUCOSAL | Status: DC | PRN

## 2016-05-02 MED ORDER — POTASSIUM CHLORIDE CRYS ER 20 MEQ PO TBCR: EXTENDED_RELEASE_TABLET | ORAL | Status: DC

## 2016-05-02 MED ORDER — LISINOPRIL-HYDROCHLOROTHIAZIDE 20-12.5 MG PO TABS: 1.0000 | ORAL_TABLET | Freq: Every day | ORAL | Status: DC

## 2016-05-02 NOTE — Assessment & Plan Note (Signed)
Up to date

## 2016-05-02 NOTE — Assessment & Plan Note (Signed)
  Assessment: Progress toward smoking cessation:   improved; back down to 1 cig/daily with her AM coffee Barriers to progress toward smoking cessation:   habit Comments: She says pharmacy never told her patch and gum were available to pick up so she has not tried them for smoking cessation.  She is willing to use them if I re-send to pharmacy.  Plan: Instruction/counseling given:  I counseled patient on the dangers of tobacco use, advised patient to stop smoking, and reviewed strategies to maximize success. Educational resources provided:  [employer]line[employer], [employer](1-800-QUIT-NOW) brochure (denies) Self management tools provided:    Medications to assist with smoking cessation:  Nicotine Patch and gum sent to pharmacy Patient agreed to the following self-care plans for smoking cessation: set a quit date and stop smoking  Other plans: RTC in 6 months for follow-up.

## 2016-05-02 NOTE — Assessment & Plan Note (Signed)
Assessment:  She reports compliance with Genvoya.  Recent labs indicate good control.   Plan:  Follow-up with Dr. Orvan Falconerampbell next month as previously scheduled.

## 2016-05-02 NOTE — Patient Instructions (Addendum)
1. I have sent in your medication refills including the nicotine patch and gum.  Please continue to work on quitting smoking.  It has been a pleasure caring for your.     2. Please take all medications as prescribed.    3. If you have worsening of your symptoms or new symptoms arise, please call the clinic [phone] or go to the ER immediately if symptoms are severe.    Please return to clinic in 6 months for follow-up.
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
## 2016-05-02 NOTE — Assessment & Plan Note (Signed)
Assessment:  Recent K normal. Plan:  Continue K supp.

## 2016-05-02 NOTE — Assessment & Plan Note (Signed)
BP Readings from Last 3 Encounters:  05/02/16 139/84  09/26/15 138/84  09/21/15 114/65    Lab Results  Component Value Date   NA 139 03/14/2016   K 3.7 03/14/2016   CREATININE 0.71 03/14/2016    Assessment: Blood pressure control:  well controlled Progress toward BP goal:   at goal Comments: Compliant with lisinopril-HCTZ 20-12.5mg  daily.  Plan: Medications:  continue current medications: lisinopril-HCTZ 20-12.5mg  daily Educational resources provided: brochure (denies ) Self management tools provided:   Other plans: RTC in 6 months for follow-up.

## 2016-05-04 NOTE — Progress Notes (Signed)
Internal Medicine Clinic Attending  Case discussed with Dr. Wilson soon after the resident saw the patient.  We reviewed the resident's history and exam and pertinent patient test results.  I agree with the assessment, diagnosis, and plan of care documented in the resident's note.  

## 2016-05-28 ENCOUNTER — Ambulatory Visit: Payer: Medicare Other | Admitting: Internal Medicine

## 2016-06-04 ENCOUNTER — Other Ambulatory Visit: Payer: Self-pay | Admitting: Internal Medicine

## 2016-06-04 DIAGNOSIS — B20 Human immunodeficiency virus [HIV] disease: Secondary | ICD-10-CM

## 2016-06-12 ENCOUNTER — Ambulatory Visit (INDEPENDENT_AMBULATORY_CARE_PROVIDER_SITE_OTHER): Payer: Medicare Other | Admitting: Internal Medicine

## 2016-06-12 DIAGNOSIS — B20 Human immunodeficiency virus [HIV] disease: Secondary | ICD-10-CM
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
## 2016-06-12 NOTE — Assessment & Plan Note (Signed)
Her HIV infection remains under excellent long-term control. She will continue Genvoya in follow-up after lab work in one year.

## 2016-07-04 DIAGNOSIS — M1611 Unilateral primary osteoarthritis, right hip: Secondary | ICD-10-CM | POA: Diagnosis not present

## 2016-08-01 DIAGNOSIS — M1611 Unilateral primary osteoarthritis, right hip: Secondary | ICD-10-CM | POA: Diagnosis not present

## 2016-08-06 ENCOUNTER — Other Ambulatory Visit: Payer: Self-pay | Admitting: Internal Medicine

## 2016-08-06 DIAGNOSIS — B20 Human immunodeficiency virus [HIV] disease: Secondary | ICD-10-CM

## 2016-10-25 ENCOUNTER — Other Ambulatory Visit: Payer: Self-pay | Admitting: Internal Medicine

## 2016-11-20 DIAGNOSIS — Z09 Encounter for follow-up examination after completed treatment for conditions other than malignant neoplasm: Secondary | ICD-10-CM | POA: Diagnosis not present

## 2016-11-20 DIAGNOSIS — M1611 Unilateral primary osteoarthritis, right hip: Secondary | ICD-10-CM | POA: Diagnosis not present

## 2016-11-20 DIAGNOSIS — Z96642 Presence of left artificial hip joint: Secondary | ICD-10-CM | POA: Diagnosis not present

## 2016-11-21 ENCOUNTER — Other Ambulatory Visit: Payer: Self-pay | Admitting: Internal Medicine

## 2016-11-21 DIAGNOSIS — I10 Essential (primary) hypertension: Secondary | ICD-10-CM

## 2016-11-21 NOTE — Telephone Encounter (Signed)
Pt is due for HTN f/u w/ me (PCP) please schedule at her earliest convenience. Refill sent.

## 2016-11-28 ENCOUNTER — Ambulatory Visit: Payer: Medicare Other

## 2016-11-30 ENCOUNTER — Encounter: Payer: Self-pay | Admitting: Internal Medicine

## 2016-11-30 ENCOUNTER — Ambulatory Visit (INDEPENDENT_AMBULATORY_CARE_PROVIDER_SITE_OTHER): Payer: Medicare Other | Admitting: Internal Medicine

## 2016-11-30 VITALS — BP 137/87 | HR 74 | Temp 97.6°F | Ht 66.0 in | Wt 246.8 lb

## 2016-11-30 DIAGNOSIS — E876 Hypokalemia: Secondary | ICD-10-CM

## 2016-11-30 DIAGNOSIS — Z833 Family history of diabetes mellitus: Secondary | ICD-10-CM | POA: Diagnosis not present

## 2016-11-30 DIAGNOSIS — Z8249 Family history of ischemic heart disease and other diseases of the circulatory system: Secondary | ICD-10-CM

## 2016-11-30 DIAGNOSIS — Z96642 Presence of left artificial hip joint: Secondary | ICD-10-CM | POA: Diagnosis not present

## 2016-11-30 DIAGNOSIS — B20 Human immunodeficiency virus [HIV] disease: Secondary | ICD-10-CM

## 2016-11-30 DIAGNOSIS — Z21 Asymptomatic human immunodeficiency virus [HIV] infection status: Secondary | ICD-10-CM

## 2016-11-30 DIAGNOSIS — Z79899 Other long term (current) drug therapy: Secondary | ICD-10-CM

## 2016-11-30 DIAGNOSIS — F17210 Nicotine dependence, cigarettes, uncomplicated: Secondary | ICD-10-CM | POA: Diagnosis not present

## 2016-11-30 DIAGNOSIS — Z23 Encounter for immunization: Secondary | ICD-10-CM

## 2016-11-30 DIAGNOSIS — E66812 Obesity, class 2: Secondary | ICD-10-CM

## 2016-11-30 DIAGNOSIS — E669 Obesity, unspecified: Secondary | ICD-10-CM

## 2016-11-30 DIAGNOSIS — Z800 Family history of malignant neoplasm of digestive organs: Secondary | ICD-10-CM | POA: Diagnosis not present

## 2016-11-30 DIAGNOSIS — Z6839 Body mass index (BMI) 39.0-39.9, adult: Secondary | ICD-10-CM

## 2016-11-30 DIAGNOSIS — F17200 Nicotine dependence, unspecified, uncomplicated: Secondary | ICD-10-CM

## 2016-11-30 DIAGNOSIS — Z8042 Family history of malignant neoplasm of prostate: Secondary | ICD-10-CM | POA: Diagnosis not present

## 2016-11-30 DIAGNOSIS — M1611 Unilateral primary osteoarthritis, right hip: Secondary | ICD-10-CM

## 2016-11-30 DIAGNOSIS — Z0000 Encounter for general adult medical examination without abnormal findings: Secondary | ICD-10-CM

## 2016-11-30 DIAGNOSIS — I10 Essential (primary) hypertension: Secondary | ICD-10-CM

## 2016-11-30 MED ORDER — LISINOPRIL-HYDROCHLOROTHIAZIDE 20-12.5 MG PO TABS: 1.0000 | ORAL_TABLET | Freq: Every day | ORAL | 1 refills | Status: DC

## 2016-11-30 MED ORDER — POTASSIUM CHLORIDE CRYS ER 20 MEQ PO TBCR: 20.0000 meq | EXTENDED_RELEASE_TABLET | Freq: Every day | ORAL | 1 refills | Status: DC

## 2016-11-30 NOTE — Patient Instructions (Signed)
Keep up the great work. I have refilled your medications for blood pressure today.  Continue to work on quitting smoking.
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
## 2016-11-30 NOTE — Assessment & Plan Note (Signed)
Patient continues to smoke one cigarette per day. She was prescribed Nicorette gum by Dr. Andrey CampanileWilson 6 months, but reports that this was too expensive. She continues to think about quitting altogether but has not had significant success until now.  Plan: I counseled the patient the importance for multiple different factors of her health. The patient will continue to work on quitting. She is not interested in other pharmacologic assistance at this time.

## 2016-11-30 NOTE — Assessment & Plan Note (Addendum)
Patient's blood pressure is well-controlled today 137/87. She continues to take lisinopril-HCTZ 20-12.5mg  daily. She is taking potassium supplementation for long-standing hypokalemia in the setting of this medication. Patient denies any chest pain, shortness of breath, headaches, changes to vision.  ASCVD 10 year risk is 7.3%. At this time no statin or aspirin therapy is indicated. Her blood pressure is adequately controlled. Her target blood pressure is less than 140/90.  Plan: Continue lisinopril-HCTZ daily. Follow-up in 6 months.

## 2016-11-30 NOTE — Assessment & Plan Note (Signed)
Patient follows with Dr. Orvan Falconerampbell. Last appointment was in August 17. At that time her HIV and viral load were good. She reports good compliance with GENVOYA. She reports that she needs to schedule a follow-up with Dr. Orvan Falconerampbell in the next several months.

## 2016-11-30 NOTE — Assessment & Plan Note (Signed)
Patient is status post total hip replacement on the left. She reports that she has some osteoarthritis pain in the right hip over the last several months. She is followed by orthopedics and has an appointment with him at the end of the month to discuss replacement on the right. She is not currently taking any medication other than occasional ibuprofen for pain.  Plan: Continue to follow with orthopedics.

## 2016-11-30 NOTE — Assessment & Plan Note (Signed)
Patient has continued to lose weight over the last 6 months. She is down 20 pounds from her previous visit in June of last year. That this is likely due to the dietary modifications which she has made eating more vegetables and cutting out meats. She is continue to try to be active but has been somewhat limited by her osteoarthritis and hip pain. She is over this will improve after her operation with orthopedics. States the importance of weight loss for her multiple health concerns and cardiac risk modification.  Plan: Continue dietary changes for weight loss. To you to work on improving her activity and daily exercise.

## 2016-11-30 NOTE — Assessment & Plan Note (Signed)
Patient continues potassium supplementation in the setting of her hydrochlorothiazide blood pressure regimen. Most recent potassium was within normal limits.  Plan: We will continue supplementation at this time and plan to recheck BMP at next visit.

## 2016-11-30 NOTE — Assessment & Plan Note (Signed)
Patient is up-to-date on her routine health maintenance. She had a mammogram in December 2016. I discussed with her scheduling another mammogram and she reports that she would like to wait until her next appointment for this. I feel that a two-year screening interval is appropriate for this patient at no increased baseline risk.  Next colonoscopy for cancer screening should be 2020.  We will provide influenza vaccination today.

## 2016-12-03 NOTE — Progress Notes (Signed)
Internal Medicine Clinic Attending  Case discussed with Dr. Strelow at the time of the visit.  We reviewed the resident's history and exam and pertinent patient test results.  I agree with the assessment, diagnosis, and plan of care documented in the resident's note.  

## 2016-12-04 DIAGNOSIS — M25552 Pain in left hip: Secondary | ICD-10-CM | POA: Diagnosis not present

## 2016-12-21 ENCOUNTER — Telehealth: Payer: Self-pay | Admitting: *Deleted

## 2016-12-21 NOTE — Telephone Encounter (Signed)
Called the patient to advise that form is complete and she needs to sign her part as we faxed it and it was returned. Left message for her to call the office asap.

## 2016-12-26 DIAGNOSIS — M1611 Unilateral primary osteoarthritis, right hip: Secondary | ICD-10-CM | POA: Diagnosis not present

## 2017-01-22 DIAGNOSIS — M1611 Unilateral primary osteoarthritis, right hip: Secondary | ICD-10-CM | POA: Diagnosis not present

## 2017-03-14 DIAGNOSIS — M1611 Unilateral primary osteoarthritis, right hip: Secondary | ICD-10-CM | POA: Diagnosis not present

## 2017-04-19 ENCOUNTER — Encounter: Payer: Self-pay | Admitting: *Deleted

## 2017-05-16 ENCOUNTER — Encounter: Payer: Self-pay | Admitting: Internal Medicine

## 2017-05-16 ENCOUNTER — Ambulatory Visit (INDEPENDENT_AMBULATORY_CARE_PROVIDER_SITE_OTHER): Payer: Medicare Other | Admitting: Internal Medicine

## 2017-05-16 VITALS — BP 135/80 | HR 79 | Temp 97.7°F | Ht 66.0 in | Wt 271.6 lb

## 2017-05-16 DIAGNOSIS — Z23 Encounter for immunization: Secondary | ICD-10-CM | POA: Diagnosis not present

## 2017-05-16 DIAGNOSIS — Z79899 Other long term (current) drug therapy: Secondary | ICD-10-CM

## 2017-05-16 DIAGNOSIS — F17210 Nicotine dependence, cigarettes, uncomplicated: Secondary | ICD-10-CM | POA: Diagnosis not present

## 2017-05-16 DIAGNOSIS — Z8639 Personal history of other endocrine, nutritional and metabolic disease: Secondary | ICD-10-CM

## 2017-05-16 DIAGNOSIS — Z0000 Encounter for general adult medical examination without abnormal findings: Secondary | ICD-10-CM

## 2017-05-16 DIAGNOSIS — E876 Hypokalemia: Secondary | ICD-10-CM | POA: Diagnosis not present

## 2017-05-16 DIAGNOSIS — I10 Essential (primary) hypertension: Secondary | ICD-10-CM

## 2017-05-16 DIAGNOSIS — F17200 Nicotine dependence, unspecified, uncomplicated: Secondary | ICD-10-CM

## 2017-05-16 MED ORDER — POTASSIUM CHLORIDE CRYS ER 20 MEQ PO TBCR: 20.0000 meq | EXTENDED_RELEASE_TABLET | Freq: Every day | ORAL | 1 refills | Status: DC

## 2017-05-16 MED ORDER — LISINOPRIL-HYDROCHLOROTHIAZIDE 20-12.5 MG PO TABS: 1.0000 | ORAL_TABLET | Freq: Every day | ORAL | 1 refills | Status: DC

## 2017-05-16 NOTE — Assessment & Plan Note (Addendum)
Reports that she smokes 2 cig/day. Reports a desire to get health as "high sugars run in the family." Interested in quitting. Given information on smoking cessation and discussed various options to aid including gum, patches, and medications. Currently working on it herself.

## 2017-05-16 NOTE — Assessment & Plan Note (Signed)
[name] reports that she has a history of hypothyroidism but was taken off medication in 2013. She would like to have he thyroid levels checked today. Denies symptoms consistent with hyperthyroidism including heat intolerance, weight loss, diarrhea, palpitations, tremor. Denies symptoms consistent with hypothyroidism including cold intolerance, weight gain, loss of hair. Endorses constipation.   Plan is to check a TSH and T4 level today.

## 2017-05-16 NOTE — Patient Instructions (Addendum)
It was so nice to meet you today [name].   Today we:  - Refilled your blood pressure medications (lisinopril-hydrochlorothiazide)  - Refilled your potassium supplements   - Checked your Kidneys   - Checked your Thyroid   - Gave you the Tdap vaccine   Hypertension Hypertension is another name for high blood pressure. High blood pressure forces your heart to work harder to pump blood. This can cause problems over time. There are two numbers in a blood pressure reading. There is a top number (systolic) over a bottom number (diastolic). It is best to have a blood pressure below 120/80. Healthy choices can help lower your blood pressure. You may need medicine to help lower your blood pressure if:  Your blood pressure cannot be lowered with healthy choices.  Your blood pressure is higher than 130/80.  Follow these instructions at home: Eating and drinking  If directed, follow the DASH eating plan. This diet includes: ? Filling half of your plate at each meal with fruits and vegetables. ? Filling one quarter of your plate at each meal with whole grains. Whole grains include whole wheat pasta, brown rice, and whole grain bread. ? Eating or drinking low-fat dairy products, such as skim milk or low-fat yogurt. ? Filling one quarter of your plate at each meal with low-fat (lean) proteins. Low-fat proteins include fish, skinless chicken, eggs, beans, and tofu. ? Avoiding fatty meat, cured and processed meat, or chicken with skin. ? Avoiding premade or processed food.  Eat less than 1,500 mg of salt (sodium) a day.  Limit alcohol use to no more than 1 drink a day for nonpregnant women and 2 drinks a day for men. One drink equals 12 oz of beer, 5 oz of wine, or 1 oz of hard liquor. Lifestyle  Work with your doctor to stay at a healthy weight or to lose weight. Ask your doctor what the best weight is for you.  Get at least 30 minutes of exercise that causes your heart to beat faster  (aerobic exercise) most days of the week. This may include walking, swimming, or biking.  Get at least 30 minutes of exercise that strengthens your muscles (resistance exercise) at least 3 days a week. This may include lifting weights or pilates.  Do not use any products that contain nicotine or tobacco. This includes cigarettes and e-cigarettes. If you need help quitting, ask your doctor.  Check your blood pressure at home as told by your doctor.  Keep all follow-up visits as told by your doctor. This is important. Medicines  Take over-the-counter and prescription medicines only as told by your doctor. Follow directions carefully.  Do not skip doses of blood pressure medicine. The medicine does not work as well if you skip doses. Skipping doses also puts you at risk for problems.  Ask your doctor about side effects or reactions to medicines that you should watch for. Contact a doctor if:  You think you are having a reaction to the medicine you are taking.  You have headaches that keep coming back (recurring).  You feel dizzy.  You have swelling in your ankles.  You have trouble with your vision. Get help right away if:  You get a very bad headache.  You start to feel confused.  You feel weak or numb.  You feel faint.  You get very bad pain in your: ? Chest. ? Belly (abdomen).  You throw up (vomit) more than once.  You have trouble breathing.  Summary  Hypertension is another name for high blood pressure.  Making healthy choices can help lower blood pressure. If your blood pressure cannot be controlled with healthy choices, you may need to take medicine. This information is not intended to replace advice given to you by your health care provider. Make sure you discuss any questions you have with your health care provider. Document Released: 04/16/2008 Document Revised: 09/26/2016 Document Reviewed: 09/26/2016 Elsevier Interactive Patient Education  2018 [employer]   Steps to Quit Smoking Smoking tobacco can be bad for your health. It can also affect almost every organ in your body. Smoking puts you and people around you at risk for many serious long-lasting (chronic) diseases. Quitting smoking is hard, but it is one of the best things that you can do for your health. It is never too late to quit. What are the benefits of quitting smoking? When you quit smoking, you lower your risk for getting serious diseases and conditions. They can include:  Lung cancer or lung disease.  Heart disease.  Stroke.  Heart attack.  Not being able to have children (infertility).  Weak bones (osteoporosis) and broken bones (fractures).  If you have coughing, wheezing, and shortness of breath, those symptoms may get better when you quit. You may also get sick less often. If you are pregnant, quitting smoking can help to lower your chances of having a baby of low birth weight. What can I do to help me quit smoking? Talk with your doctor about what can help you quit smoking. Some things you can do (strategies) include:  Quitting smoking totally, instead of slowly cutting back how much you smoke over a period of time.  Going to in-person counseling. You are more likely to quit if you go to many counseling sessions.  Using resources and support systems, such as: ? Agricultural engineernline chats with a Veterinary surgeoncounselor. ? Phone quitlines. ? Automotive engineerrinted self-help materials. ? Support groups or group counseling. ? Text messaging programs. ? Mobile phone apps or applications.  Taking medicines. Some of these medicines may have nicotine in them. If you are pregnant or breastfeeding, do not take any medicines to quit smoking unless your doctor says it is okay. Talk with your doctor about counseling or other things that can help you.  Talk with your doctor about using more than one strategy at the same time, such as taking medicines while you are also going to in-person counseling. This can help  make quitting easier. What things can I do to make it easier to quit? Quitting smoking might feel very hard at first, but there is a lot that you can do to make it easier. Take these steps:  Talk to your family and friends. Ask them to support and encourage you.  Call phone quitlines, reach out to support groups, or work with a Veterinary surgeoncounselor.  Ask people who smoke to not smoke around you.  Avoid places that make you want (trigger) to smoke, such as: ? Bars. ? Parties. ? Smoke-break areas at work.  Spend time with people who do not smoke.  Lower the stress in your life. Stress can make you want to smoke. Try these things to help your stress: ? Getting regular exercise. ? Deep-breathing exercises. ? Yoga. ? Meditating. ? Doing a body scan. To do this, close your eyes, focus on one area of your body at a time from head to toe, and notice which parts of your body are tense. Try to relax the muscles  in those areas.  Download or buy apps on your mobile phone or tablet that can help you stick to your quit plan. There are many free apps, such as QuitGuide from the [employer] Office manager for Disease Control and Prevention). You can find more support from smokefree.gov and other websites.  This information is not intended to replace advice given to you by your health care provider. Make sure you discuss any questions you have with your health care provider. Document Released: 08/25/2009 Document Revised: 06/26/2016 Document Reviewed: 03/15/2015 Elsevier Interactive Patient Education  2018 [employer].

## 2017-05-16 NOTE — Assessment & Plan Note (Signed)
[name] is up-to-date on her health maintenance. She will be due for a mammogram in December of 2018, this will make it two years since her last. She received the influenza vaccine in 11/2016. She received her Tdap on 05/16/2017. Next colonoscopy should be in 2020.

## 2017-05-16 NOTE — Assessment & Plan Note (Signed)
[name]'s BP is well-controlled today 135/80. She continues to take lisinopril-HCTZ 20-12.5mg  daily. She is taking potassium supplementation for long-standing hypokalemia in the setting of this medication. Patient denies any chest pain, shortness of breath, headaches, changes to vision, orthopnea, or PND. She does have moderate pitting edema of the LE bilaterally. No medications will be changed or altered at this point.   Plan: Continue lisinopril-HCTZ daily. Follow-up in 3 months. Discuss indications for Statin therapy at that point.

## 2017-05-16 NOTE — Progress Notes (Signed)
   Subjective:    Patient ID: Nicole [name], female    DOB: 07-Jan-1961, 56 y.o.   MRN: [id number]  Nicole [name] is here for a check-up of her chronic medical issues, including Hypertension, tobacco dependence, and history of hypothyroidism.   Hypertension  This is a chronic problem. The current episode started more than 1 year ago. The problem is controlled. Associated symptoms include peripheral edema (moderate pitting). Pertinent negatives include no chest pain, orthopnea, palpitations, PND or shortness of breath. There are no associated agents to hypertension. Risk factors for coronary artery disease include obesity, smoking/tobacco exposure, sedentary lifestyle and family history. There is no history of angina, kidney disease or heart failure. Identifiable causes of hypertension include a thyroid problem.  Thyroid Problem  Presents for follow-up visit. Symptoms include constipation and leg swelling. Patient reports no anxiety, cold intolerance, diarrhea, dry skin, hair loss, heat intolerance, palpitations, tremors, weight gain or weight loss. There is no history of heart failure.   Review of Systems  Constitutional: Negative for activity change, appetite change, weight gain and weight loss.  Eyes: Positive for discharge. Negative for redness and itching.  Respiratory: Negative for chest tightness and shortness of breath.   Cardiovascular: Positive for leg swelling. Negative for chest pain, palpitations, orthopnea and PND.  Gastrointestinal: Positive for constipation. Negative for abdominal distention and diarrhea.  Endocrine: Negative for cold intolerance, heat intolerance, polydipsia and polyuria.  Genitourinary: Negative for dysuria, frequency and urgency.  Musculoskeletal: Negative for back pain.  Skin: Negative for color change.  Neurological: Negative for dizziness, tremors and light-headedness.  Psychiatric/Behavioral: The patient is not nervous/anxious.       Objective:   Physical  Exam  Constitutional: She is oriented to person, place, and time. She appears well-developed and well-nourished. No distress.  HENT:  Head: Normocephalic and atraumatic.  Mouth/Throat: Oropharynx is clear and moist.  Eyes: Conjunctivae are normal. Pupils are equal, round, and reactive to light.  Neck: Neck supple. No thyromegaly present.  Cardiovascular: Normal rate, regular rhythm, normal heart sounds and intact distal pulses.   Pulmonary/Chest: Effort normal and breath sounds normal.  Abdominal: Soft. Bowel sounds are normal. She exhibits no distension. There is no tenderness.  Musculoskeletal: Normal range of motion. She exhibits edema (moderate pitting ).  Neurological: She is alert and oriented to person, place, and time.      Assessment & Plan:  Please see problem based charting.

## 2017-05-16 NOTE — Assessment & Plan Note (Signed)
Will check BMP today. Potassium supplementation will be continued for now. History of hypokalemia induced by her hydrochlorothiazide.

## 2017-05-17 LAB — BMP8+ANION GAP
ANION GAP: 16 mmol/L (ref 10.0–18.0)
BUN/Creatinine Ratio: 21 (ref 9–23)
BUN: 16 mg/dL (ref 6–24)
CALCIUM: 9.7 mg/dL (ref 8.7–10.2)
CO2: 21 mmol/L (ref 20–29)
Chloride: 104 mmol/L (ref 96–106)
Creatinine, Ser: 0.77 mg/dL (ref 0.57–1.00)
GFR calc Af Amer: 99 mL/min/{1.73_m2} (ref >59)
GFR, EST NON AFRICAN AMERICAN: 86 mL/min/{1.73_m2} (ref >59)
Glucose: 123 mg/dL — ABNORMAL HIGH (ref 65–99)
POTASSIUM: 3.9 mmol/L (ref 3.5–5.2)
Sodium: 141 mmol/L (ref 134–144)

## 2017-05-17 LAB — T4: T4 TOTAL: 9.1 ug/dL (ref 4.5–12.0)

## 2017-05-17 LAB — TSH: TSH: 0.39 u[IU]/mL — AB (ref 0.450–4.500)

## 2017-05-17 NOTE — Progress Notes (Signed)
Internal Medicine Clinic Attending  I saw and evaluated the patient.  I personally confirmed the key portions of the history and exam documented by Dr. Helberg and I reviewed pertinent patient test results.  The assessment, diagnosis, and plan were formulated together and I agree with the documentation in the resident's note. 

## 2017-05-18 ENCOUNTER — Encounter: Payer: Self-pay | Admitting: Internal Medicine

## 2017-05-20 ENCOUNTER — Encounter: Payer: Self-pay | Admitting: Internal Medicine

## 2017-06-03 ENCOUNTER — Telehealth: Payer: Self-pay

## 2017-06-03 NOTE — Telephone Encounter (Signed)
lisinopril-hydrochlorothiazide (PRINZIDE,ZESTORETIC) 20-12.5 MG tablet,      potassium chloride SA (K-DUR,KLOR-CON) 20 MEQ tablet, refill request

## 2017-06-03 NOTE — Telephone Encounter (Signed)
These were [facility], [facility] the specialty [facility] and they are the ones who fill all her meds, the scripts were given verbally and [facility] [address] removed from her pharm list. Also had to put dr Midwifebutcher as prescriber because it rejected the resident

## 2017-06-04 ENCOUNTER — Other Ambulatory Visit: Payer: Medicare Other

## 2017-06-04 DIAGNOSIS — B20 Human immunodeficiency virus [HIV] disease: Secondary | ICD-10-CM

## 2017-06-04 LAB — COMPREHENSIVE METABOLIC PANEL
ALK PHOS: 58 U/L (ref 33–130)
ALT: 13 U/L (ref 6–29)
AST: 13 U/L (ref 10–35)
Albumin: 3.9 g/dL (ref 3.6–5.1)
BILIRUBIN TOTAL: 0.5 mg/dL (ref 0.2–1.2)
BUN: 9 mg/dL (ref 7–25)
CO2: 26 mmol/L (ref 20–31)
CREATININE: 0.71 mg/dL (ref 0.50–1.05)
Calcium: 9.1 mg/dL (ref 8.6–10.4)
Chloride: 106 mmol/L (ref 98–110)
GLUCOSE: 189 mg/dL — AB (ref 65–99)
Potassium: 3.8 mmol/L (ref 3.5–5.3)
SODIUM: 140 mmol/L (ref 135–146)
TOTAL PROTEIN: 7.2 g/dL (ref 6.1–8.1)

## 2017-06-04 LAB — CBC
HEMATOCRIT: 39.6 % (ref 35.0–45.0)
Hemoglobin: 13.3 g/dL (ref 11.7–15.5)
MCH: 30.9 pg (ref 27.0–33.0)
MCHC: 33.6 g/dL (ref 32.0–36.0)
MCV: 92.1 fL (ref 80.0–100.0)
MPV: 11 fL (ref 7.5–12.5)
PLATELETS: 211 10*3/uL (ref 140–400)
RBC: 4.3 MIL/uL (ref 3.80–5.10)
RDW: 13.9 % (ref 11.0–15.0)
WBC: 11.7 10*3/uL — AB (ref 3.8–10.8)

## 2017-06-05 LAB — T-HELPER CELL (CD4) - (RCID CLINIC ONLY)
CD4 T CELL HELPER: 42 % (ref 33–55)
CD4 T Cell Abs: 1510 /uL (ref 400–2700)

## 2017-06-05 LAB — RPR

## 2017-06-07 LAB — HIV-1 RNA QUANT-NO REFLEX-BLD
HIV 1 RNA Quant: <20 copies/mL
HIV-1 RNA Quant, Log: <1.3 Log copies/mL

## 2017-06-18 ENCOUNTER — Encounter: Payer: Self-pay | Admitting: Internal Medicine

## 2017-06-18 ENCOUNTER — Ambulatory Visit (INDEPENDENT_AMBULATORY_CARE_PROVIDER_SITE_OTHER): Payer: Medicare Other | Admitting: Internal Medicine

## 2017-06-18 DIAGNOSIS — K089 Disorder of teeth and supporting structures, unspecified: Secondary | ICD-10-CM | POA: Insufficient documentation

## 2017-06-18 DIAGNOSIS — B20 Human immunodeficiency virus [HIV] disease: Secondary | ICD-10-CM

## 2017-06-18 NOTE — Assessment & Plan Note (Signed)
We will schedule her to be seen in the dental clinic.

## 2017-06-18 NOTE — Assessment & Plan Note (Signed)
Her infection remains under excellent, long-term control. She will continue Genvoya follow-up here after lab work in one year.
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
## 2017-07-11 ENCOUNTER — Encounter (INDEPENDENT_AMBULATORY_CARE_PROVIDER_SITE_OTHER): Payer: Medicare Other

## 2017-07-18 ENCOUNTER — Ambulatory Visit (INDEPENDENT_AMBULATORY_CARE_PROVIDER_SITE_OTHER): Payer: Medicare Other | Admitting: Family Medicine

## 2017-07-18 ENCOUNTER — Encounter (INDEPENDENT_AMBULATORY_CARE_PROVIDER_SITE_OTHER): Payer: Self-pay

## 2017-08-05 ENCOUNTER — Other Ambulatory Visit: Payer: Self-pay | Admitting: Internal Medicine

## 2017-08-05 DIAGNOSIS — B20 Human immunodeficiency virus [HIV] disease: Secondary | ICD-10-CM

## 2017-08-12 ENCOUNTER — Telehealth: Payer: Self-pay | Admitting: *Deleted

## 2017-08-12 ENCOUNTER — Telehealth: Payer: Self-pay | Admitting: Internal Medicine

## 2017-08-12 NOTE — Telephone Encounter (Signed)
Patient called stating, "everytime I drink water, it runs right through me." She is also having some burning. Advised patient to call her PCP, [facility] and see if she can come in to give a urine sample. She agreed to do this.

## 2017-08-16 ENCOUNTER — Ambulatory Visit (INDEPENDENT_AMBULATORY_CARE_PROVIDER_SITE_OTHER): Payer: Medicare Other | Admitting: Internal Medicine

## 2017-08-16 ENCOUNTER — Ambulatory Visit (INDEPENDENT_AMBULATORY_CARE_PROVIDER_SITE_OTHER): Payer: Medicare Other | Admitting: Dietician

## 2017-08-16 ENCOUNTER — Encounter (INDEPENDENT_AMBULATORY_CARE_PROVIDER_SITE_OTHER): Payer: Self-pay

## 2017-08-16 VITALS — BP 115/65 | HR 88 | Temp 97.9°F | Ht 66.0 in | Wt 262.2 lb

## 2017-08-16 DIAGNOSIS — Z21 Asymptomatic human immunodeficiency virus [HIV] infection status: Secondary | ICD-10-CM | POA: Diagnosis not present

## 2017-08-16 DIAGNOSIS — Z713 Dietary counseling and surveillance: Secondary | ICD-10-CM | POA: Diagnosis not present

## 2017-08-16 DIAGNOSIS — B1920 Unspecified viral hepatitis C without hepatic coma: Secondary | ICD-10-CM | POA: Diagnosis not present

## 2017-08-16 DIAGNOSIS — R350 Frequency of micturition: Secondary | ICD-10-CM | POA: Diagnosis not present

## 2017-08-16 DIAGNOSIS — E119 Type 2 diabetes mellitus without complications: Secondary | ICD-10-CM | POA: Insufficient documentation

## 2017-08-16 DIAGNOSIS — E118 Type 2 diabetes mellitus with unspecified complications: Secondary | ICD-10-CM

## 2017-08-16 DIAGNOSIS — R739 Hyperglycemia, unspecified: Secondary | ICD-10-CM

## 2017-08-16 DIAGNOSIS — M1990 Unspecified osteoarthritis, unspecified site: Secondary | ICD-10-CM

## 2017-08-16 HISTORY — DX: Frequency of micturition: R35.0

## 2017-08-16 LAB — POCT URINALYSIS DIPSTICK
Bilirubin, UA: NEGATIVE
Ketones, UA: NEGATIVE
Protein, UA: NEGATIVE
SPEC GRAV UA: 1.01 (ref 1.010–1.025)
UROBILINOGEN UA: 1 U/dL
pH, UA: 5.5 (ref 5.0–8.0)

## 2017-08-16 LAB — BASIC METABOLIC PANEL
ANION GAP: 8 (ref 5–15)
BUN: 11 mg/dL (ref 6–20)
CHLORIDE: 99 mmol/L — AB (ref 101–111)
CO2: 26 mmol/L (ref 22–32)
Calcium: 9.2 mg/dL (ref 8.9–10.3)
Creatinine, Ser: 0.87 mg/dL (ref 0.44–1.00)
GFR calc Af Amer: >60 mL/min (ref >60)
Glucose, Bld: 427 mg/dL — ABNORMAL HIGH (ref 65–99)
Potassium: 3.8 mmol/L (ref 3.5–5.1)
Sodium: 133 mmol/L — ABNORMAL LOW (ref 135–145)

## 2017-08-16 LAB — GLUCOSE, CAPILLARY: Glucose-Capillary: 431 mg/dL — ABNORMAL HIGH (ref 65–99)

## 2017-08-16 LAB — POCT GLYCOSYLATED HEMOGLOBIN (HGB A1C): HEMOGLOBIN A1C: 13.1

## 2017-08-16 MED ORDER — METFORMIN HCL ER 500 MG PO TB24: 500.0000 mg | ORAL_TABLET | Freq: Every day | ORAL | 0 refills | Status: DC

## 2017-08-16 MED ORDER — INSULIN GLARGINE 100 UNITS/ML SOLOSTAR PEN: 25.0000 [IU] | PEN_INJECTOR | Freq: Every day | SUBCUTANEOUS | 2 refills | Status: DC

## 2017-08-16 NOTE — Patient Instructions (Signed)
Ms. Kindred,  It was nice to meet you today!!!  You didBarbarygreat giving yourself an injection of insulin and checking your blood sugar.   Check your blood sugar each morning before eating or drinking anything.  Inject the lantus insulin after you check your blood  sugar.     A plan to support myself in continuing these changes to care for my diabetes is to attend or contact:   Weight Management ? Weight Watchers-3205782769-www. weightwatchers.com ? Over eaters Anonymous- (713)202-5715 (support group)- www.oa.org  Exercise ? [employer] -716-470-5106- www.curves.com ? [employer]-289-109-9892- www24hourfitness.com ?  local gym and fitness center as an option  Diabetes Support Group Type 2 diabetes support group : 2nd Monday of every month from 6-7 PM at [address] [facility] conference room [phone]  Other  -doctor's office, CDE, Dietitian, pharmacist, church  See you in a few weeks!  Lupita Leash  [phone]

## 2017-08-16 NOTE — Progress Notes (Addendum)
Accu chek fastclix Lancet is broken. Needs strips, insulin not paid for by her insurance.  [facility], [facility] need [facility].    No eye doctor, needs eye exam, request referral to eye doctor.

## 2017-08-16 NOTE — Assessment & Plan Note (Addendum)
Hyperglycemia has been noted on prior BMETs. Patient reports 6 day history of increased urinary frequency and burning, and on dipstick, her glucose is over 1000 mg/dL. We have obtained an A1c as well as cbg which is over 13 and cbg 400s. Patient is otherwise relatively asymptomatic. She endorses good diet. Denies dizziness or neurologic manifestations.   Plan -obtain stat BMET- shows normal bicarb, anion gap, as well as normal renal function- and so we can safely send her home.  -start metformin 500 mg daily -start her on insulin 0.2 units/kg, so with her body weight, she will need 25 units of lantus/levemir. xplained that she will need to check her CBGs and if her cbg is less than 80, then she needs to decrease her lantus dose by 4. -explained symptoms of hypoglycemia and what to do in case of low blood sugar -Ms Nicole [name] was also present in the room and explained to the patient about how to inject the insulin as well as how to use the meter and strips. -sample of long acting insulin provided -close follow up in 1 week
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
## 2017-08-16 NOTE — Patient Instructions (Signed)
Thank you for your visit today Please take 25 units of Lantus daily, and take metformin daily I have explained you the symptoms of hypoglycemia- and am providing some more information below  Please follow up in 1 week  Blood Glucose Monitoring, Adult Monitoring your blood sugar (glucose) helps you manage your diabetes. It also helps you and your health care provider determine how well your diabetes management plan is working. Blood glucose monitoring involves checking your blood glucose as often as directed, and keeping a record (log) of your results over time. Why should I monitor my blood glucose? Checking your blood glucose regularly can:  Help you understand how food, exercise, illnesses, and medicines affect your blood glucose.  Let you know what your blood glucose is at any time. You can quickly tell if you are having low blood glucose (hypoglycemia) or high blood glucose (hyperglycemia).  Help you and your health care provider adjust your medicines as needed.  When should I check my blood glucose? Follow instructions from your health care provider about how often to check your blood glucose. This may depend on:  The type of diabetes you have.  How well-controlled your diabetes is.  Medicines you are taking.  If you have type 1 diabetes:  Check your blood glucose at least 2 times a day.  Also check your blood glucose: ? Before every insulin injection. ? Before and after exercise. ? Between meals. ? 2 hours after a meal. ? Occasionally between 2:00 a.m. and 3:00 a.m., as directed. ? Before potentially dangerous tasks, like driving or using heavy machinery. ? At bedtime.  You may need to check your blood glucose more often, up to 6-10 times a day: ? If you use an insulin pump. ? If you need multiple daily injections (MDI). ? If your diabetes is not well-controlled. ? If you are ill. ? If you have a history of severe hypoglycemia. ? If you have a history of not knowing  when your blood glucose is getting low (hypoglycemia unawareness). If you have type 2 diabetes:  If you take insulin or other diabetes medicines, check your blood glucose at least 2 times a day.  If you are on intensive insulin therapy, check your blood glucose at least 4 times a day. Occasionally, you may also need to check between 2:00 a.m. and 3:00 a.m., as directed.  Also check your blood glucose: ? Before and after exercise. ? Before potentially dangerous tasks, like driving or using heavy machinery.  You may need to check your blood glucose more often if: ? Your medicine is being adjusted. ? Your diabetes is not well-controlled. ? You are ill. What is a blood glucose log?  A blood glucose log is a record of your blood glucose readings. It helps you and your health care provider: ? Look for patterns in your blood glucose over time. ? Adjust your diabetes management plan as needed.  Every time you check your blood glucose, write down your result and notes about things that may be affecting your blood glucose, such as your diet and exercise for the day.  Most glucose meters store a record of glucose readings in the meter. Some meters allow you to download your records to a computer. How do I check my blood glucose? Follow these steps to get accurate readings of your blood glucose: Supplies needed   Blood glucose meter.  Test strips for your meter. Each meter has its own strips. You must use the strips that come  with your meter.  A needle to prick your finger (lancet). Do not use lancets more than once.  A device that holds the lancet (lancing device).  A journal or log book to write down your results. Procedure  Wash your hands with soap and water.  Prick the side of your finger (not the tip) with the lancet. Use a different finger each time.  Gently rub the finger until a small drop of blood appears.  Follow instructions that come with your meter for inserting the  test strip, applying blood to the strip, and using your blood glucose meter.  Write down your result and any notes. Alternative testing sites  Some meters allow you to use areas of your body other than your finger (alternative sites) to test your blood.  If you think you may have hypoglycemia, or if you have hypoglycemia unawareness, do not use alternative sites. Use your finger instead.  Alternative sites may not be as accurate as the fingers, because blood flow is slower in these areas. This means that the result you get may be delayed, and it may be different from the result that you would get from your finger.  The most common alternative sites are: ? Forearm. ? Thigh. ? Palm of the hand. Additional tips  Always keep your supplies with you.  If you have questions or need help, all blood glucose meters have a 24-hour "hotline" number that you can call. You may also contact your health care provider.  After you use a few boxes of test strips, adjust (calibrate) your blood glucose meter by following instructions that came with your meter. This information is not intended to replace advice given to you by your health care provider. Make sure you discuss any questions you have with your health care provider. Document Released: 11/01/2003 Document Revised: 05/18/2016 Document Reviewed: 04/09/2016 Elsevier Interactive Patient Education  2017 [employer].   Hypoglycemia Hypoglycemia is when the sugar (glucose) level in the blood is too low. Symptoms of low blood sugar may include:  Feeling: ? Hungry. ? Worried or nervous (anxious). ? Sweaty and clammy. ? Confused. ? Dizzy. ? Sleepy. ? Sick to your stomach (nauseous).  Having: ? A fast heartbeat. ? A headache. ? A change in your vision. ? Jerky movements that you cannot control (seizure). ? Nightmares. ? Tingling or no feeling (numbness) around the mouth, lips, or tongue.  Having trouble with: ? Talking. ? Paying  attention (concentrating). ? Moving (coordination). ? Sleeping.  Shaking.  Passing out (fainting).  Getting upset easily (irritability).  Low blood sugar can happen to people who have diabetes and people who do not have diabetes. Low blood sugar can happen quickly, and it can be an emergency. Treating Low Blood Sugar Low blood sugar is often treated by eating or drinking something sugary right away. If you can think clearly and swallow safely, follow the 15:15 rule:  Take 15 grams of a fast-acting carb (carbohydrate). Some fast-acting carbs are: ? 1 tube of glucose gel. ? 3 sugar tablets (glucose pills). ? 6-8 pieces of hard candy. ? 4 oz (120 mL) of fruit juice. ? 4 oz (120 mL) of regular (not diet) soda.  Check your blood sugar 15 minutes after you take the carb.  If your blood sugar is still at or below 70 mg/dL (3.9 mmol/L), take 15 grams of a carb again.  If your blood sugar does not go above 70 mg/dL (3.9 mmol/L) after 3 tries, get help right away.  After your blood sugar goes back to normal, eat a meal or a snack within 1 hour.  Treating Very Low Blood Sugar If your blood sugar is at or below 54 mg/dL (3 mmol/L), you have very low blood sugar (severe hypoglycemia). This is an emergency. Do not wait to see if the symptoms will go away. Get medical help right away. Call your local emergency services (911 in the [place] Do not drive yourself to the hospital. If you have very low blood sugar and you cannot eat or drink, you may need a glucagon shot (injection). A family member or friend should learn how to check your blood sugar and how to give you a glucagon shot. Ask your doctor if you need to have a glucagon shot kit at home. Follow these instructions at home: General instructions  Avoid any diets that cause you to not eat enough food. Talk with your doctor before you start any new diet.  Take over-the-counter and prescription medicines only as told by your doctor.  Limit  alcohol to no more than 1 drink per day for nonpregnant women and 2 drinks per day for men. One drink equals 12 oz of beer, 5 oz of wine, or 1 oz of hard liquor.  Keep all follow-up visits as told by your doctor. This is important. If You Have Diabetes:   Make sure you know the symptoms of low blood sugar.  Always keep a source of sugar with you, such as: ? Sugar. ? Sugar tablets. ? Glucose gel. ? Fruit juice. ? Regular soda (not diet soda). ? Milk. ? Hard candy. ? Honey.  Take your medicines as told.  Follow your exercise and meal plan. ? Eat on time. Do not skip meals. ? Follow your sick day plan when you cannot eat or drink normally. Make this plan ahead of time with your doctor.  Check your blood sugar as often as told by your doctor. Always check before and after exercise.  Share your diabetes care plan with: ? Your work or school. ? People you live with.  Check your pee (urine) for ketones: ? When you are sick. ? As told by your doctor.  Carry a card or wear jewelry that says you have diabetes. If You Have Low Blood Sugar From Other Causes:   Check your blood sugar as often as told by your doctor.  Follow instructions from your doctor about what you cannot eat or drink. Contact a doctor if:  You have trouble keeping your blood sugar in your target range.  You have low blood sugar often. Get help right away if:  You still have symptoms after you eat or drink something sugary.  Your blood sugar is at or below 54 mg/dL (3 mmol/L).  You have jerky movements that you cannot control.  You pass out. These symptoms may be an emergency. Do not wait to see if the symptoms will go away. Get medical help right away. Call your local emergency services (911 in the [place] Do not drive yourself to the hospital. This information is not intended to replace advice given to you by your health care provider. Make sure you discuss any questions you have with your health care  provider. Document Released: 01/23/2010 Document Revised: 04/05/2016 Document Reviewed: 12/02/2015 Elsevier Interactive Patient Education  [employer].

## 2017-08-16 NOTE — Assessment & Plan Note (Addendum)
Patient reports 6 days history of increased urinary frequency and some burning. Dipstick shows small leuks but neg nitrites. She also has over >1000 glucose, wich makes [place] concerned that she has undiagnosed and untreated diabetes which si driving her symptoms, rather than a UTI.   Plan -check A1c and POC CBG--> Patient's a1c is 13- so this is a manifestation of her symptomatic diabetes -We will address the diabetes- if she continues to have symptoms after addressing this, then we will treat her with antibiotics- but for now, she does not have a UTI and so antibiotics are not needed

## 2017-08-19 ENCOUNTER — Other Ambulatory Visit: Payer: Self-pay | Admitting: Dietician

## 2017-08-19 DIAGNOSIS — E118 Type 2 diabetes mellitus with unspecified complications: Secondary | ICD-10-CM

## 2017-08-19 MED ORDER — METFORMIN HCL ER 500 MG PO TB24: 500.0000 mg | ORAL_TABLET | Freq: Every day | ORAL | 0 refills | Status: DC

## 2017-08-19 NOTE — Telephone Encounter (Signed)
[name] calls saying that [facility] called [facility] and they could not say when th metformin will get to [name]. They would like her metformin prescription for the next month be [facility] [address] so she can get started on it.

## 2017-08-19 NOTE — Telephone Encounter (Signed)
patients sister calls for Nicole [name] (who I can hear in the background and gives her sister permission to talk to me) concerned about Nicole [name]'s blood sugars.   Last night blood sugar meter read  "hi", this am 317. They says she has been taking 25 units of insulin in the morning, we verified she was probably getting that amount by having her take pen out and read what line plunger was near.  Her sister takes lantus too. She has not gotten the metformin. They will call the mail order pharmacy to find bout when it will be delivered.  She is eating fried foods: Fish sandwich, [employer] on Saturday, Mustard greens, mac& cheese, chicken livers fried yesterday. She is Drinking water, diet coke, working on the diet per her sister, she knows they needs ot cut back on fried foods. She also has been encouraging her to walk.  Plan: encouraged lot's of water evening if it feels like she is peeing too much, cut back on starchy foods and fried foods. Transferred call to front office to make a follow up for this Friday.

## 2017-08-19 NOTE — Progress Notes (Signed)
Internal Medicine Clinic Attending  Case discussed with Dr. Saraiya at the time of the visit.  We reviewed the resident's history and exam and pertinent patient test results.  I agree with the assessment, diagnosis, and plan of care documented in the resident's note.  

## 2017-08-23 ENCOUNTER — Encounter: Payer: Self-pay | Admitting: Internal Medicine

## 2017-08-23 ENCOUNTER — Ambulatory Visit (INDEPENDENT_AMBULATORY_CARE_PROVIDER_SITE_OTHER): Payer: Medicare Other | Admitting: Internal Medicine

## 2017-08-23 VITALS — BP 140/65 | HR 76 | Temp 97.8°F | Ht 66.0 in | Wt 263.0 lb

## 2017-08-23 DIAGNOSIS — E118 Type 2 diabetes mellitus with unspecified complications: Secondary | ICD-10-CM

## 2017-08-23 DIAGNOSIS — E119 Type 2 diabetes mellitus without complications: Secondary | ICD-10-CM

## 2017-08-23 DIAGNOSIS — Z794 Long term (current) use of insulin: Secondary | ICD-10-CM

## 2017-08-23 DIAGNOSIS — B1920 Unspecified viral hepatitis C without hepatic coma: Secondary | ICD-10-CM

## 2017-08-23 DIAGNOSIS — Z23 Encounter for immunization: Secondary | ICD-10-CM

## 2017-08-23 DIAGNOSIS — I10 Essential (primary) hypertension: Secondary | ICD-10-CM | POA: Diagnosis not present

## 2017-08-23 DIAGNOSIS — Z6841 Body Mass Index (BMI) 40.0 and over, adult: Secondary | ICD-10-CM

## 2017-08-23 DIAGNOSIS — R350 Frequency of micturition: Secondary | ICD-10-CM

## 2017-08-23 DIAGNOSIS — Z21 Asymptomatic human immunodeficiency virus [HIV] infection status: Secondary | ICD-10-CM | POA: Diagnosis not present

## 2017-08-23 DIAGNOSIS — Z0000 Encounter for general adult medical examination without abnormal findings: Secondary | ICD-10-CM

## 2017-08-23 LAB — GLUCOSE, CAPILLARY: GLUCOSE-CAPILLARY: 168 mg/dL — AB (ref 65–99)

## 2017-08-23 MED ORDER — INSULIN GLARGINE 100 UNITS/ML SOLOSTAR PEN: 30.0000 [IU] | PEN_INJECTOR | Freq: Every day | SUBCUTANEOUS | 2 refills | Status: DC

## 2017-08-23 MED ORDER — METFORMIN HCL ER 500 MG PO TB24: 500.0000 mg | ORAL_TABLET | Freq: Two times a day (BID) | ORAL | 0 refills | Status: DC

## 2017-08-23 NOTE — Assessment & Plan Note (Signed)
Given flu vaccine 

## 2017-08-23 NOTE — Assessment & Plan Note (Addendum)
Patient presents as a 1 week follow up for her newly diagnosed diabetes. She has been taking the lantus 25 units daily, the metformin 500 mg daily, and checking her CBGs. She feels better and says her urinary frequency has resolved. Her A1c last week was 13. CBG today was 160s and pt was fasting this morning. She is having some diarrhea fromt he metformin but says she is able to tolerate it well. I explained that it will take some time to be used to metformin. CBG readings show average of 322, and lowest of 203. She denies any hypoglycaemic episodes.  Plan -Increase metformin to 500 mg BID. I advised that if she has worsening of diarrhea or if she is not able to tolerate it , then go back to just once a day, and then we can switch to the extended release -Increased lantus to 30 units daily -obtain urine microalbumin -would prefer to not start her on adjunct agents like trulicity at this visit since she has just been diagnosed with - but will need to be discussed in subsequent visits -follow up in 1 month

## 2017-08-23 NOTE — Progress Notes (Signed)
Internal Medicine Clinic Attending  Case discussed with Dr. Saraiya at the time of the visit.  We reviewed the resident's history and exam and pertinent patient test results.  I agree with the assessment, diagnosis, and plan of care documented in the resident's note.  

## 2017-08-23 NOTE — Patient Instructions (Signed)
Thank you for your visit today Please take metformin 500 mg twice a day Please take lantus 30 units daily Please follow up in 1 month
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
## 2017-08-23 NOTE — Assessment & Plan Note (Signed)
Her increased urinary frequency has resolved.

## 2017-08-24 LAB — MICROALBUMIN / CREATININE URINE RATIO
Creatinine, Urine: 60.4 mg/dL
MICROALB/CREAT RATIO: 19 mg/g{creat} (ref 0.0–30.0)
Microalbumin, Urine: 11.5 ug/mL

## 2017-08-26 ENCOUNTER — Other Ambulatory Visit: Payer: Self-pay | Admitting: Internal Medicine

## 2017-08-26 DIAGNOSIS — E118 Type 2 diabetes mellitus with unspecified complications: Secondary | ICD-10-CM

## 2017-08-26 NOTE — Telephone Encounter (Signed)
Sent to dr Caron Presume

## 2017-08-26 NOTE — Telephone Encounter (Signed)
Patient would like Dr to place a order for her for some little needles for insulin

## 2017-08-26 NOTE — Telephone Encounter (Signed)
done

## 2017-08-28 NOTE — Telephone Encounter (Signed)
Genella MechHey Donna, I am unsure how to write a prescription for these needles. Specifically I am unsure what to put for the "dose." Could you help with this?

## 2017-08-29 ENCOUNTER — Other Ambulatory Visit: Payer: Self-pay | Admitting: *Deleted

## 2017-08-29 MED ORDER — PEN NEEDLES 31G X 5 MM MISC: 1.0000 | Freq: Every day | 3 refills | Status: DC

## 2017-08-30 ENCOUNTER — Telehealth: Payer: Self-pay | Admitting: Internal Medicine

## 2017-08-30 NOTE — Telephone Encounter (Signed)
Patient states insurance will not pay for insulin, pls call back

## 2017-09-02 ENCOUNTER — Other Ambulatory Visit: Payer: Self-pay | Admitting: Internal Medicine

## 2017-09-02 ENCOUNTER — Ambulatory Visit (INDEPENDENT_AMBULATORY_CARE_PROVIDER_SITE_OTHER): Payer: Medicare Other | Admitting: Internal Medicine

## 2017-09-02 DIAGNOSIS — R21 Rash and other nonspecific skin eruption: Secondary | ICD-10-CM

## 2017-09-02 DIAGNOSIS — H1589 Other disorders of sclera: Secondary | ICD-10-CM | POA: Diagnosis not present

## 2017-09-02 DIAGNOSIS — F17210 Nicotine dependence, cigarettes, uncomplicated: Secondary | ICD-10-CM

## 2017-09-02 DIAGNOSIS — L814 Other melanin hyperpigmentation: Secondary | ICD-10-CM | POA: Diagnosis not present

## 2017-09-02 DIAGNOSIS — M1990 Unspecified osteoarthritis, unspecified site: Secondary | ICD-10-CM

## 2017-09-02 DIAGNOSIS — L988 Other specified disorders of the skin and subcutaneous tissue: Secondary | ICD-10-CM | POA: Diagnosis not present

## 2017-09-02 DIAGNOSIS — Z21 Asymptomatic human immunodeficiency virus [HIV] infection status: Secondary | ICD-10-CM | POA: Diagnosis not present

## 2017-09-02 DIAGNOSIS — I10 Essential (primary) hypertension: Secondary | ICD-10-CM

## 2017-09-02 DIAGNOSIS — E118 Type 2 diabetes mellitus with unspecified complications: Secondary | ICD-10-CM

## 2017-09-02 HISTORY — DX: Rash and other nonspecific skin eruption: R21

## 2017-09-02 MED ORDER — INSULIN DETEMIR 100 UNIT/ML FLEXPEN: 30.0000 [IU] | PEN_INJECTOR | Freq: Every day | SUBCUTANEOUS | 2 refills | Status: DC

## 2017-09-02 MED ORDER — CLOBETASOL PROPIONATE 0.05 % EX OINT: 1.0000 "application " | TOPICAL_OINTMENT | Freq: Two times a day (BID) | CUTANEOUS | 0 refills | Status: DC

## 2017-09-02 MED ORDER — INSULIN GLARGINE 100 UNITS/ML SOLOSTAR PEN: 30.0000 [IU] | PEN_INJECTOR | Freq: Every day | SUBCUTANEOUS | 2 refills | Status: DC

## 2017-09-02 NOTE — Progress Notes (Signed)
Spoke with the patient's pharmacy. Prescription sent for Levemir 30 units QHS. Will discontinue Lantus 30 units QHS.

## 2017-09-02 NOTE — Patient Instructions (Signed)
It was a pleasure to see you today [name]. Please make the following changes:  STOP taking metformin Continue taking lantus  Take clobestol propionate for the rash  If you have any questions or concerns, please call our clinic at [phone] between 9am-5pm and after hours call [phone] and ask for the internal medicine resident on call. If you feel you are having a medical emergency please call 911.   Thank you, we look forward to help you remain healthy!  Lorenso CourierVahini Ilija Maxim, MD Internal Medicine PGY1

## 2017-09-02 NOTE — Assessment & Plan Note (Addendum)
The patient states that she has started noticing the rash 4 weeks ago on the stomach. The rash initially started on the stomach and has spread to other parts of her body. She says that it is itchy. She has not used any medication for it.    There are numerous rashes on the patients abdomen that are 2-3cm in size. They are hyperpigmented with an area of hypopigmentation in the center. The rash is mainly localized to the lower abdomen, but also present on the left upper trunk and breast.   The patient states that she started noticing the rash after she was started on metformin.   Although the patient's rash is not typical of a drug rash- it is not maculopapular or coalescent, the temporal relationship between when the rash was first seen and when the patient was started on metformin make it a possibility.   -stop metformin -clobestol priopionate ointment was prescribed

## 2017-09-02 NOTE — Telephone Encounter (Signed)
Called the patient's pharmacy. Switched to Levemir. Discontinue Lantus.
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
## 2017-09-03 ENCOUNTER — Other Ambulatory Visit: Payer: Self-pay | Admitting: *Deleted

## 2017-09-03 NOTE — Telephone Encounter (Signed)
Dr. Selena BattenKim,   Do you know what ointment will be covered for this patient? Thanks  Circuit City[place]ahini

## 2017-09-04 ENCOUNTER — Telehealth: Payer: Self-pay | Admitting: *Deleted

## 2017-09-04 MED ORDER — HALOBETASOL PROPIONATE 0.05 % EX OINT: TOPICAL_OINTMENT | CUTANEOUS | 0 refills | Status: DC

## 2017-09-04 NOTE — Telephone Encounter (Signed)
Thank you Dr. Kim 

## 2017-09-04 NOTE — Progress Notes (Signed)
Internal Medicine Clinic Attending  I saw and evaluated the patient.  I personally confirmed the key portions of the history and exam documented by Dr. Chundi and I reviewed pertinent patient test results.  The assessment, diagnosis, and plan were formulated together and I agree with the documentation in the resident's note. 

## 2017-09-04 NOTE — Telephone Encounter (Signed)
Hi Dr. Delma Officerhundi, the insurance plan stated halobetasol is covered so I sent that one in.  Thank you

## 2017-09-04 NOTE — Telephone Encounter (Signed)
Please clarify does pt need clobeasol or halobetasol, both have been sent to pharm and pharmacist calls to ask if pt is meant to get both, may call pharmacist at [phone]

## 2017-09-10 ENCOUNTER — Encounter: Payer: Self-pay | Admitting: Dietician

## 2017-09-10 ENCOUNTER — Ambulatory Visit (INDEPENDENT_AMBULATORY_CARE_PROVIDER_SITE_OTHER): Payer: Medicare Other | Admitting: Dietician

## 2017-09-10 ENCOUNTER — Other Ambulatory Visit: Payer: Self-pay | Admitting: Dietician

## 2017-09-10 DIAGNOSIS — E119 Type 2 diabetes mellitus without complications: Secondary | ICD-10-CM

## 2017-09-10 DIAGNOSIS — E118 Type 2 diabetes mellitus with unspecified complications: Secondary | ICD-10-CM

## 2017-09-10 DIAGNOSIS — Z713 Dietary counseling and surveillance: Secondary | ICD-10-CM

## 2017-09-10 NOTE — Addendum Note (Signed)
Addended by: Baird CancerPLYLER, DONNA M on: 09/10/2017 10:46 AM   Modules accepted: Orders

## 2017-09-10 NOTE — Progress Notes (Signed)
Diabetes Self-Management Education  Visit Type:     Appt. Start Time: 945 Appt. End Time: 1100  09/10/2017  Ms. Elray McgregorCynthia Gosse, identified by name and date of birth, is a 56 y.o. female with a diagnosis of Diabetes:  .   ASSESSMENT  CBG on her meter was 159 after breakfast Suggest if CBG are not mostly under 120 at next visit to retry metformin or add a GLP-1. weight loss goal is 5-7% which is 13-18# (245-250#)   She paid for testing supplies out of her own pocket. neds a prescription.  [facility]  Weight 262 lb 1.6 oz (118.9 kg). weight loss goal is 5-7% which is 13-18#  Body mass index is 42.3 kg/m.       Diabetes Self-Management Education - 09/10/17 1100      Patient Education   Nutrition management  Role of diet in the treatment of diabetes and the relationship between the three main macronutrients and blood glucose level   Psychosocial adjustment Helped patient identify a support system for diabetes management   Personal strategies to promote health Helped patient develop diabetes management plan for (enter comment)  weight loss/ healthy eating     Individualized Goals (developed by patient)   Nutrition Follow meal plan discussed     Patient Self-Evaluation of Goals - Patient rates self as meeting previously set goals (% of time)   Medications >75%   Monitoring >75%     Outcomes   Program Status Not Completed      Learning Objective:  Patient will have a greater understanding of diabetes self-management. Patient education plan is to attend individual and/or group sessions per assessed needs and concerns. My plan to support myself in continuing these changes to care for my diabetes is to attend or contact:   local support resources -doctor's office, CDE, Dietitian, pharmacist, church Journals ? Diabetes Forecast- [phone]- DirectoryTags.siwww.diabetesforecast.org ? Diabetes Self-Management- [phone]-  www.diabetesselfmanagement.com   Plan:   Patient Instructions  Ask your sister about an eye doctor and let your doctor know who you want to see.   How to eat healthy for your diabetes and to help with weight loss.  Eat more vegetables, fill up your half your plate with  Veggies at both lunch and dinner Eat more often whole grain bread, brown rice,  Home made Raisin bran, cereal and crackers, oatmeal. Eat Less sugar using light yogurt Cook foods by baking, boiling, broiling, stir fry Eat fish at least 2-3 times a week - bake or broil it.  ( Don't fry it)  Eat [place] sausage instead fo liver pudding, bacon or regular sausage, when you have sausage-   Keep checking your feet every day.        Expected Outcomes:  Demonstrated interest in learning. Expect positive outcomes  Education material provided: My Plate  If problems or questions, patient to contact team via:  Phone  Future DSME appointment: - 4-6 wks  Plyler, Lupita Leashonna, RD 09/10/2017 11:29 AM.

## 2017-09-10 NOTE — Telephone Encounter (Signed)
Patient request [facility] for insulin and [facility]

## 2017-09-10 NOTE — Patient Instructions (Addendum)
Ask your sister about an eye doctor and let your doctor know who you want to see.   How to eat healthy for your diabetes and to help with weight loss.  Eat more vegetables, fill up your half your plate with  Veggies at both lunch and dinner Eat more whole grain bread, brown rice,  Home made Raisin bran, whole grain cereal and crackers, oatmeal. Eat Less sugar using light yogurt Cook foods by baking, boiling, broiling, stir fry Eat fish at least 2-3 times a week - bake or broil it.  (Don't fry it)  Eat [place] sausage instead fo liver pudding, bacon or regular sausage, when you have sausage-   Keep checking your feet every day.

## 2017-09-11 MED ORDER — ACCU-CHEK FASTCLIX LANCETS MISC: 6 refills | Status: DC

## 2017-09-11 MED ORDER — ACCU-CHEK FASTCLIX LANCET KIT: PACK | 1 refills | Status: DC

## 2017-09-11 MED ORDER — GLUCOSE BLOOD VI STRP: ORAL_STRIP | 5 refills | Status: DC

## 2017-09-11 NOTE — Progress Notes (Signed)
Hi Lupita LeashDonna,   Thank you for the recommendations. I will definitely discuss starting either the metformin or adding a GLP-1 at her next visit!

## 2017-09-11 NOTE — Addendum Note (Signed)
Addended by: Baird CancerPLYLER, DONNA M on: 09/11/2017 02:02 PM   Modules accepted: Orders

## 2017-09-13 MED ORDER — GLUCOSE BLOOD VI STRP: ORAL_STRIP | 5 refills | Status: DC

## 2017-09-13 MED ORDER — INSULIN GLARGINE 100 UNITS/ML SOLOSTAR PEN: 30.0000 [IU] | PEN_INJECTOR | Freq: Every day | SUBCUTANEOUS | 2 refills | Status: DC

## 2017-09-13 MED ORDER — ACCU-CHEK FASTCLIX LANCETS MISC: 5 refills | Status: DC

## 2017-09-13 MED ORDER — ACCU-CHEK FASTCLIX LANCET KIT: PACK | 1 refills | Status: DC

## 2017-09-16 ENCOUNTER — Other Ambulatory Visit: Payer: Self-pay | Admitting: Dietician

## 2017-09-16 DIAGNOSIS — E118 Type 2 diabetes mellitus with unspecified complications: Secondary | ICD-10-CM

## 2017-09-16 MED ORDER — INSULIN GLARGINE 100 UNITS/ML SOLOSTAR PEN
30.0000 [IU] | PEN_INJECTOR | Freq: Every day | SUBCUTANEOUS | 2 refills | Status: DC
Start: 2017-09-16 — End: 2017-09-16

## 2017-09-16 MED ORDER — INSULIN GLARGINE 100 UNIT/ML SOLOSTAR PEN: 30.0000 [IU] | PEN_INJECTOR | Freq: Every day | SUBCUTANEOUS | 2 refills | Status: DC

## 2017-09-16 MED ORDER — INSULIN GLARGINE 100 UNITS/ML SOLOSTAR PEN: 30.0000 [IU] | PEN_INJECTOR | Freq: Every day | SUBCUTANEOUS | 2 refills | Status: DC

## 2017-09-16 NOTE — Addendum Note (Signed)
Addended by: Mliss FritzKIM, JENNIFER J on: 09/16/2017 03:26 PM   Modules accepted: Orders

## 2017-09-16 NOTE — Telephone Encounter (Signed)
[name]. The prescription looks like it was printed instead of being e-prescribed. "Normal." [facility]

## 2017-09-20 ENCOUNTER — Ambulatory Visit (INDEPENDENT_AMBULATORY_CARE_PROVIDER_SITE_OTHER): Payer: Medicare Other | Admitting: Internal Medicine

## 2017-09-20 DIAGNOSIS — B20 Human immunodeficiency virus [HIV] disease: Secondary | ICD-10-CM | POA: Diagnosis not present

## 2017-09-20 DIAGNOSIS — Z794 Long term (current) use of insulin: Secondary | ICD-10-CM

## 2017-09-20 DIAGNOSIS — E118 Type 2 diabetes mellitus with unspecified complications: Secondary | ICD-10-CM | POA: Diagnosis not present

## 2017-09-20 DIAGNOSIS — I10 Essential (primary) hypertension: Secondary | ICD-10-CM

## 2017-09-20 DIAGNOSIS — M1990 Unspecified osteoarthritis, unspecified site: Secondary | ICD-10-CM | POA: Diagnosis not present

## 2017-09-20 MED ORDER — LIRAGLUTIDE 18 MG/3ML ~~LOC~~ SOPN
PEN_INJECTOR | SUBCUTANEOUS | 2 refills | Status: DC
Start: 2017-09-20 — End: 2017-09-26

## 2017-09-20 NOTE — Patient Instructions (Signed)
[name],  Continue to take the Lantus 30 units every night. I am going to start you on a new medication today called Liraglutide. Take 0.6 mg (0.1 mL) every morning for the first week. Then increase the dose to 1.2 mg (0.2 mL) every day.  Please follow up with your PCP in 2 months.

## 2017-09-20 NOTE — Assessment & Plan Note (Addendum)
Lab Results  Component Value Date   HGBA1C 13.1 08/16/2017    Nicole [name] was recently diagnosed with DM on 10/5. A1C noted to be 13.1 at that time. She was started on Lantus 25 units qhs and metformin 500 mg daily at that time. She followed up a week later and her Lantus was up-titrated to 30 units qhs and given instructions to up-titrate her metformin as well.  Unfortunately, she subsequently presented with abdominal rash attributed to her metformin and this was discontinued.  Today, she reports she is doing well on the Lantus qhs. Denies any episodes of hypoglycemia. She has meet with Lupita LeashDonna and reports working on her diet. Not exercising.   Did bring her meter with her today. Average reading is 163. She is only checking in the mornings.  A/P: Continue Lantus 30 units qhs Start liraglutide today Advised to check her CBGs 3-4 times daily Follow up in 2 months with PCP
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
## 2017-09-23 NOTE — Progress Notes (Signed)
Internal Medicine Clinic Attending  Case discussed with Dr. Boswell at the time of the visit.  We reviewed the resident's history and exam and pertinent patient test results.  I agree with the assessment, diagnosis, and plan of care documented in the resident's note.  

## 2017-09-24 ENCOUNTER — Telehealth: Payer: Self-pay | Admitting: Internal Medicine

## 2017-09-24 DIAGNOSIS — E118 Type 2 diabetes mellitus with unspecified complications: Secondary | ICD-10-CM

## 2017-09-26 ENCOUNTER — Other Ambulatory Visit: Payer: Self-pay | Admitting: *Deleted

## 2017-09-26 MED ORDER — LIRAGLUTIDE 18 MG/3ML ~~LOC~~ SOPN: PEN_INJECTOR | SUBCUTANEOUS | 2 refills | Status: DC

## 2017-09-26 NOTE — Telephone Encounter (Signed)
Patient called inquiring abt status for Liraglutide that was ordered 09/20/17. She stated she had spoken to the nurse & she was told [facility] & that this pharmacy will deliver med to her. [facility] & has not received med so she has not started taking it yet.  Sent new rx request to MD to be sent to requested pharmacy.

## 2017-09-26 NOTE — Telephone Encounter (Signed)
Liraglutide was ordered 09/20/17 OV, [facility]. Patient has not received med yet & [facility]. Requesting new Rx. Thanks!

## 2017-09-27 ENCOUNTER — Other Ambulatory Visit: Payer: Self-pay | Admitting: *Deleted

## 2017-09-27 DIAGNOSIS — E118 Type 2 diabetes mellitus with unspecified complications: Secondary | ICD-10-CM

## 2017-10-01 ENCOUNTER — Encounter: Payer: Self-pay | Admitting: Dietician

## 2017-10-01 ENCOUNTER — Encounter: Payer: Self-pay | Admitting: Internal Medicine

## 2017-10-01 ENCOUNTER — Ambulatory Visit (INDEPENDENT_AMBULATORY_CARE_PROVIDER_SITE_OTHER): Payer: Medicare Other | Admitting: Dietician

## 2017-10-01 DIAGNOSIS — E119 Type 2 diabetes mellitus without complications: Secondary | ICD-10-CM

## 2017-10-01 DIAGNOSIS — E118 Type 2 diabetes mellitus with unspecified complications: Secondary | ICD-10-CM

## 2017-10-01 DIAGNOSIS — F17210 Nicotine dependence, cigarettes, uncomplicated: Secondary | ICD-10-CM

## 2017-10-01 DIAGNOSIS — Z794 Long term (current) use of insulin: Secondary | ICD-10-CM | POA: Diagnosis not present

## 2017-10-01 DIAGNOSIS — Z713 Dietary counseling and surveillance: Secondary | ICD-10-CM

## 2017-10-01 DIAGNOSIS — Z6841 Body Mass Index (BMI) 40.0 and over, adult: Secondary | ICD-10-CM

## 2017-10-01 MED ORDER — ACCU-CHEK FASTCLIX LANCET KIT: PACK | 1 refills | Status: DC

## 2017-10-01 MED ORDER — ACCU-CHEK FASTCLIX LANCETS MISC: 6 refills | Status: DC

## 2017-10-01 MED ORDER — GLUCOSE BLOOD VI STRP: ORAL_STRIP | 5 refills | Status: DC

## 2017-10-01 MED ORDER — PEN NEEDLES 31G X 5 MM MISC: 2.0000 | Freq: Every day | 3 refills | Status: DC

## 2017-10-01 NOTE — Patient Instructions (Addendum)
Increase  Your sugar should be between 70 and 130 most of the time. After eating it may go up to 180 but should not go above 180 very often.  If it goes above 180 and stays high, please keep checking your blood call the office.   Call us if your sugar goes up and stays up or goes below 70.  Healthy snacks instead of chips- fruit, low fat popcorn  Please try to see me on January the 17th when you are her. Call for an appointment after November 12, 2017

## 2017-10-01 NOTE — Telephone Encounter (Signed)
Patient requests lancets, test strips and lancing devices prescription be [facility] [address]   She also needs two pen needles per day. A new prescription will be needed.

## 2017-10-01 NOTE — Progress Notes (Signed)
Diabetes Self-Management Education  Visit Type:  Follow-up  Appt. Start Time: 1015 Appt. End Time: 1120  10/01/2017  [name], identified by name and date of birth, is a 56 y.o. female with a diagnosis of Diabetes: Type 2.   ASSESSMENT Needs two pen needles a day Started victoza last Friday. It is lowering blood sugars gradually. Suggest reducing lantus by 5 units to prevent hypoglycemia  lancing device and strip prescriptions  [facility] [address]- she will go in the next month and ask them to fax us the records.  Dentist- call Dr. Jearld Piesambell's office to see if they still have one.   Visual foot exam done today.  Pulses felt, feet look good, needs to thin and soften calluses on heels. Patient educated about how to do this safely and importance of quitting smoking. Recommend podiatry referral   Blood sugar download average 153- fasting went from 140-160 prior to victoza and now is 106-120s.   Weight 260 lb 8 oz (118.2 kg). Body mass index is 42.05 kg/m.   Diabetes Self-Management Education - 10/01/17 1500      Initial Visit   What type of meal plan do you follow?  -- limited bread and sweets      Health Coping   How would you rate your overall health?  Good      Psychosocial Assessment   Patient Belief/Attitude about Diabetes  Motivated to manage diabetes    Self-care barriers  Lack of transportation;Lack of material resources    Patient Concerns  Glycemic Control    Preferred Learning Style  Auditory;Hands on      Dietary Intake   Breakfast  -- 730-800 Two toast with peanut butter, fried egg, coffee     Lunch  --  fruit or bowl of cereal, peas    Dinner  -- two pieces chicken baked leg or drum stick, vegtables  beans      Exercise   Exercise Type  ADL's;Light (walking / raking leaves)    How many days per week to you exercise?  7    How many minutes per day do you exercise?  15    Total minutes per week of exercise  105      Patient  Education   Physical activity and exercise   Role of exercise on diabetes management, blood pressure control and cardiac health.;Helped patient identify appropriate exercises in relation to his/her diabetes, diabetes complications and other health issue.    Medications  Reviewed patients medication for diabetes, action, purpose, timing of dose and side effects.    Monitoring  Taught/discussed recording of test results and interpretation of SMBG.;Daily foot exams;Yearly dilated eye exam    Chronic complications  Dental care      Patient Self-Evaluation of Goals - Patient rates self as meeting previously set goals (% of time)   Nutrition  25 - 50%      Outcomes   Program Status  Not Completed      Subsequent Visit   Since your last visit have you continued or begun to take your medications as prescribed?  Yes    Since your last visit have you had your blood pressure checked?  No    Since your last visit have you experienced any weight changes?  No change    Since your last visit, are you checking your blood glucose at least once a day?  Yes       Learning Objective:  Patient will have a greater understanding of diabetes self-management. Patient education plan is to attend individual and/or group sessions per assessed needs and concerns.  My plan to support myself in continuing these changes to care for my diabetes is to attend or contact:   1. Her sister is her main support. They live together.  2. Type 2 diabetes support group : 2nd Monday of every month from 6-7 PM at [address] [facility] conference room [phone] 3. doctor's office, CDE, Dietitian, pharmacist, church   Plan:   Patient Instructions  Increase  Your sugar should be between 70 and 130 most of the time. After eating it may go up to 180 but should not go above 180 very often.  If it goes above 180 and stays high, please keep checking your blood call the office.   Call us if your sugar goes up and  stays up or goes below 70.  Healthy snacks instead of chips- fruit, low fat popcorn  Please try to see me on January the 17th when you are her. Call for an appointment after November 12, 2017    Expected Outcomes:  Demonstrated interest in learning. Expect positive outcomes  Education material provided: Support group flyer , foot care, weekly activity plan, leg exercises for diabetes  If problems or questions, patient to contact team via:  Phone  Future DSME appointment: - 2 months  Yamato Kopf, Lupita Leashonna, RD, CDE 10/01/2017 3:26 PM.

## 2017-10-11 ENCOUNTER — Ambulatory Visit: Payer: Medicare Other | Admitting: Dietician

## 2017-10-15 DIAGNOSIS — M1611 Unilateral primary osteoarthritis, right hip: Secondary | ICD-10-CM | POA: Diagnosis not present

## 2017-10-19 ENCOUNTER — Encounter (HOSPITAL_COMMUNITY): Payer: Self-pay | Admitting: Emergency Medicine

## 2017-10-19 ENCOUNTER — Ambulatory Visit (HOSPITAL_COMMUNITY)
Admission: EM | Admit: 2017-10-19 | Discharge: 2017-10-19 | Disposition: A | Discharge status: Home / Self Care | Payer: Medicare Other | Attending: Internal Medicine | Admitting: Internal Medicine

## 2017-10-19 ENCOUNTER — Ambulatory Visit (INDEPENDENT_AMBULATORY_CARE_PROVIDER_SITE_OTHER): Payer: Medicare Other

## 2017-10-19 DIAGNOSIS — S9031XA Contusion of right foot, initial encounter: Secondary | ICD-10-CM

## 2017-10-19 DIAGNOSIS — M79671 Pain in right foot: Secondary | ICD-10-CM | POA: Diagnosis not present

## 2017-10-19 NOTE — Discharge Instructions (Signed)
No fractures today which is great. May ambulate as tolerated, but for the next 48 hours would rest as much as you can, ice as needed. May take Tylenol for pain. FU as needed.
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
## 2017-11-25 ENCOUNTER — Other Ambulatory Visit: Payer: Self-pay | Admitting: Internal Medicine

## 2017-11-28 ENCOUNTER — Ambulatory Visit (INDEPENDENT_AMBULATORY_CARE_PROVIDER_SITE_OTHER): Payer: Medicare Other | Admitting: Internal Medicine

## 2017-11-28 ENCOUNTER — Ambulatory Visit (INDEPENDENT_AMBULATORY_CARE_PROVIDER_SITE_OTHER): Payer: Medicare Other | Admitting: Dietician

## 2017-11-28 ENCOUNTER — Other Ambulatory Visit: Payer: Self-pay

## 2017-11-28 ENCOUNTER — Encounter: Payer: Self-pay | Admitting: Internal Medicine

## 2017-11-28 VITALS — BP 120/82 | HR 74 | Temp 97.3°F | Ht 66.0 in | Wt 256.7 lb

## 2017-11-28 DIAGNOSIS — Z79899 Other long term (current) drug therapy: Secondary | ICD-10-CM

## 2017-11-28 DIAGNOSIS — E119 Type 2 diabetes mellitus without complications: Secondary | ICD-10-CM

## 2017-11-28 DIAGNOSIS — I10 Essential (primary) hypertension: Secondary | ICD-10-CM | POA: Diagnosis not present

## 2017-11-28 DIAGNOSIS — F17200 Nicotine dependence, unspecified, uncomplicated: Secondary | ICD-10-CM

## 2017-11-28 DIAGNOSIS — E669 Obesity, unspecified: Secondary | ICD-10-CM | POA: Diagnosis not present

## 2017-11-28 DIAGNOSIS — F17210 Nicotine dependence, cigarettes, uncomplicated: Secondary | ICD-10-CM | POA: Diagnosis not present

## 2017-11-28 DIAGNOSIS — Z794 Long term (current) use of insulin: Secondary | ICD-10-CM | POA: Diagnosis not present

## 2017-11-28 DIAGNOSIS — Z713 Dietary counseling and surveillance: Secondary | ICD-10-CM | POA: Diagnosis not present

## 2017-11-28 DIAGNOSIS — E118 Type 2 diabetes mellitus with unspecified complications: Secondary | ICD-10-CM

## 2017-11-28 DIAGNOSIS — Z6841 Body Mass Index (BMI) 40.0 and over, adult: Secondary | ICD-10-CM

## 2017-11-28 LAB — GLUCOSE, CAPILLARY: GLUCOSE-CAPILLARY: 82 mg/dL (ref 65–99)

## 2017-11-28 LAB — POCT GLYCOSYLATED HEMOGLOBIN (HGB A1C): HEMOGLOBIN A1C: 6.6

## 2017-11-28 MED ORDER — NICOTINE POLACRILEX 2 MG MT GUM: 2.0000 mg | CHEWING_GUM | OROMUCOSAL | 0 refills | Status: DC | PRN

## 2017-11-28 NOTE — Assessment & Plan Note (Signed)
[name]'s blood pressures well controlled 120/82. She continues to take her Lisinopril-HCTZ as prescribed. She has changed her diet and has lost ~16 lbs over the past 6 months. She avoids high salt food. She denies symptoms of hypotension, vision changes, orthopnea, PND, or LE edema.   Plan: Continue Lisinopril-HCTZ daily

## 2017-11-28 NOTE — Patient Instructions (Addendum)
What we talked about today:   Your diabetes is now well controlled with an A1c of 6.6 that means your average sugar in your blood is about 140  The plan:   1- [facility]15 AM [address], [place], [place] [address] Phone: [phone]  2- Call the quitline  3- Suggest you make an appointment to follow up with me in 3 months- same day as doctor .  Call anytime with questions or concerns  Nicole ParcelDonna Plyler Diabetes Educator [phone]
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
## 2017-11-28 NOTE — Assessment & Plan Note (Addendum)
[name] is doing well today. She states that since our last visit she has cut out starchy foods like potatoes and tries to control her food portions. She has lost approximately 16 lbs over the past 6 months. She has not started exercising daily and has not had an eye exam since this year. We did bring her meter today that showed she is checking her fasting CBG daily with an average of 119. She denies symptoms of hypoglycemia, symptoms of neuropathy, visual changes, or symptoms of hyperglycemia.   She was evaluated in November 2018 at that point her Lantus was continued at 30 units and Liraglutide was added to her diabetes regimen. She denies any adverse side effects from the Liraglutide.   Plan:  - POC A1c  - Continue Lantus 30 units QHS and Liraglutide 1.2 mg QD - Will change medication management if A1c continues to be grossly elevated - Referral for eye exam  - Encouraged to continue her dietary changes and weight loss

## 2017-11-28 NOTE — Progress Notes (Signed)
  Diabetes Self-Management Education  Visit Type: Follow-up  Appt. Start Time: 1345 Appt. End Time: 1430  11/28/2017  Nicole. Nicole [name], identified by name and date of birth, is a 57 y.o. female with a diagnosis of Diabetes: Type 2.   ASSESSMENT Request an ophthalmology referral- appointment made [phone] Dental - sent note to ID about a dentist- ID will call and gave Nicole [name] a number and to call Nicole CheMargaret if she does not hear from them.  Feet- checks them daily and does not have problems  Smoking cessation counseling done today- she says she'd call quitline  Feet- checks them daily and does not have problems  Smoking cessation counseling done today- she says she'd call quitline  Lab Results  Component Value Date   HGBA1C 6.6 11/28/2017   Blood Pressure 11/28/2017 10/19/2017 09/20/2017 09/02/2017 08/23/2017  BP 120/82 134/84 114/74 127/68 140/65  Some recent data might be hidden    A1C much improved.  weight 256# decreased 4#.   Diabetes Self-Management Education - 11/28/17 1400      Complications   Last HgB A1C per patient/outside source  6.6 %  (Pended)     Fasting Blood glucose range (mg/dL)  [id number]  (Pended)     Number of hypoglycemic episodes per month  0  (Pended)     Number of hyperglycemic episodes per week  0  (Pended)     Have you had a dilated eye exam in the past 12 months?  No  (Pended)     Have you had a dental exam in the past 12 months?  No  (Pended)     Are you checking your feet?  Yes  (Pended)       Subsequent Visit   Is your most recent blood pressure lower, unchanged, or higher since your last visit?  Lower    Since your last visit have you experienced any weight changes?  Loss    Weight Loss (lbs)  4    Since your last visit, are you checking your blood glucose at least once a day?  Yes       Individualized Plan for Diabetes Self-Management Training:   Learning Objective:  Patient will have a greater understanding of diabetes  self-management. Patient education plan is to attend individual and/or group sessions per assessed needs and concerns.   Plan:   Patient Instructions  What we talked about today:   Your diabetes is now well controlled with an A1c of 6.6 that means your average sugar in your blood is about 140  The plan:   1- [facility]15 AM [address], [place], [place] [address] Phone: [phone]  2- Call the quitline  3- Suggest you make an appointment to follow up with me in 3 months- same day as doctor .  Call anytime with questions or concerns  Nicole [name] Diabetes Educator [phone]     Expected Outcomes:     Education material provided: Support group flyer  If problems or questions, patient to contact team via:  Phone  Future DSME appointment:  3 months Nicole Parcelonna Trentin Knappenberger, RD 11/28/2017 2:57 PM.

## 2017-11-28 NOTE — Assessment & Plan Note (Signed)
Reports smoking 1/3 PPD. Asking for nicotine gum to aid in cessation.

## 2017-11-28 NOTE — Patient Instructions (Addendum)
It was a pleasure to see you today. Great job with the dietary changes and the weight loss. For now we are not going to change any of your medicines. I will let you know what your A1c is when I get the results and we will make changes if needed.   Keep up the great work!

## 2017-12-04 NOTE — Progress Notes (Signed)
Internal Medicine Clinic Attending  Case discussed with Dr. Helberg at the time of the visit.  We reviewed the resident's history and exam and pertinent patient test results.  I agree with the assessment, diagnosis, and plan of care documented in the resident's note.    

## 2017-12-06 DIAGNOSIS — H04123 Dry eye syndrome of bilateral lacrimal glands: Secondary | ICD-10-CM | POA: Diagnosis not present

## 2017-12-06 DIAGNOSIS — H2513 Age-related nuclear cataract, bilateral: Secondary | ICD-10-CM | POA: Diagnosis not present

## 2017-12-06 DIAGNOSIS — H35033 Hypertensive retinopathy, bilateral: Secondary | ICD-10-CM | POA: Diagnosis not present

## 2017-12-06 DIAGNOSIS — E119 Type 2 diabetes mellitus without complications: Secondary | ICD-10-CM | POA: Diagnosis not present

## 2017-12-06 LAB — HM DIABETES EYE EXAM

## 2017-12-09 ENCOUNTER — Telehealth: Payer: Self-pay | Admitting: Internal Medicine

## 2017-12-09 NOTE — Telephone Encounter (Signed)
Patient would like Mamie to call her back

## 2017-12-09 NOTE — Telephone Encounter (Signed)
Pt states she needs a referral to Podiatrist - states needs her toenails trimmed. Hx of diabetes. Thanks

## 2017-12-10 ENCOUNTER — Encounter: Payer: Self-pay | Admitting: *Deleted

## 2017-12-10 ENCOUNTER — Other Ambulatory Visit: Payer: Self-pay | Admitting: Internal Medicine

## 2017-12-10 DIAGNOSIS — E118 Type 2 diabetes mellitus with unspecified complications: Secondary | ICD-10-CM

## 2017-12-10 NOTE — Progress Notes (Signed)
Patient asking for referral to podiatry. Referral placed.

## 2017-12-10 NOTE — Telephone Encounter (Signed)
Referral has been sent. Thank you

## 2017-12-11 ENCOUNTER — Other Ambulatory Visit: Payer: Self-pay

## 2017-12-11 NOTE — Telephone Encounter (Signed)
Nicole [name] with [facility] requesting potassium chloride SA (K-DUR,KLOR-CON) 20 MEQ tablet to be filled.

## 2017-12-30 ENCOUNTER — Other Ambulatory Visit: Payer: Self-pay

## 2017-12-30 NOTE — Telephone Encounter (Signed)
potassium chloride SA (K-DUR,KLOR-CON) 20 MEQ tablet, refill request.

## 2018-01-01 MED ORDER — POTASSIUM CHLORIDE CRYS ER 20 MEQ PO TBCR: 20.0000 meq | EXTENDED_RELEASE_TABLET | Freq: Every day | ORAL | 1 refills | Status: DC

## 2018-01-03 DIAGNOSIS — H16223 Keratoconjunctivitis sicca, not specified as Sjogren's, bilateral: Secondary | ICD-10-CM | POA: Diagnosis not present

## 2018-01-03 DIAGNOSIS — H04123 Dry eye syndrome of bilateral lacrimal glands: Secondary | ICD-10-CM | POA: Diagnosis not present

## 2018-01-03 DIAGNOSIS — H04213 Epiphora due to excess lacrimation, bilateral lacrimal glands: Secondary | ICD-10-CM | POA: Diagnosis not present

## 2018-01-03 DIAGNOSIS — H01009 Unspecified blepharitis unspecified eye, unspecified eyelid: Secondary | ICD-10-CM | POA: Diagnosis not present

## 2018-01-06 ENCOUNTER — Ambulatory Visit (INDEPENDENT_AMBULATORY_CARE_PROVIDER_SITE_OTHER): Payer: Medicare Other | Admitting: Podiatry

## 2018-01-06 VITALS — BP 129/77 | HR 69

## 2018-01-06 DIAGNOSIS — E0843 Diabetes mellitus due to underlying condition with diabetic autonomic (poly)neuropathy: Secondary | ICD-10-CM | POA: Diagnosis not present

## 2018-01-06 DIAGNOSIS — L84 Corns and callosities: Secondary | ICD-10-CM

## 2018-01-06 DIAGNOSIS — M79676 Pain in unspecified toe(s): Secondary | ICD-10-CM

## 2018-01-06 DIAGNOSIS — B351 Tinea unguium: Secondary | ICD-10-CM

## 2018-01-06 NOTE — Progress Notes (Signed)
   Subjective:    Patient ID: Nicole [name], female    DOB: [birth year], 59 y.o.   MRN: [id number]  HPI  Chief Complaint  Patient presents with  . Diabetes    Diabetic foot exam/ reqesting new diabetic shoes  . Callouses    callus bottom of right foot       Review of Systems  Musculoskeletal: Positive for gait problem.  Neurological: Positive for weakness and numbness.  All other systems reviewed and are negative.      Objective:   Physical Exam        Assessment & Plan:

## 2018-01-07 ENCOUNTER — Other Ambulatory Visit: Payer: Medicare Other
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
## 2018-01-15 ENCOUNTER — Telehealth: Payer: Self-pay | Admitting: Internal Medicine

## 2018-01-15 NOTE — Telephone Encounter (Signed)
Returned patient's call. States in need of diabetic shoe for left foot. Was told at [facility] that cost is $80. Patient is unable to afford this. Transferred pt to DME Coordinator. Kinnie FeilL. Ducatte, RN, BSN

## 2018-01-15 NOTE — Telephone Encounter (Signed)
Patient is requesting Mamie to callback

## 2018-01-15 NOTE — Telephone Encounter (Signed)
Call[facility] where the [facility] gets her [facility] and spoke with Rick's Assistant who helps with the patient to obtain the shoes.  Forms will be sent over for her PCP to complete form their office.  Their office does work with the patient to set up a payment plan and the co-pay is not paid up front.  Called the patient and notified her of the process and payment plan. She now understands the process a little better now and will work with their office to pay for her shoes.

## 2018-01-20 ENCOUNTER — Other Ambulatory Visit: Payer: Self-pay | Admitting: Internal Medicine

## 2018-01-22 ENCOUNTER — Telehealth: Payer: Self-pay | Admitting: Podiatry

## 2018-01-22 NOTE — Telephone Encounter (Signed)
I was referred to [facility] with a prescription to get diabetic shoes with a brace made in the shoe. Pt stated she called [facility] and they told her they no longer do that. Pt requested a call back at [phone].

## 2018-02-11 DIAGNOSIS — M1611 Unilateral primary osteoarthritis, right hip: Secondary | ICD-10-CM | POA: Diagnosis not present

## 2018-02-20 ENCOUNTER — Other Ambulatory Visit: Payer: Self-pay | Admitting: Internal Medicine

## 2018-02-27 ENCOUNTER — Ambulatory Visit (INDEPENDENT_AMBULATORY_CARE_PROVIDER_SITE_OTHER): Payer: Medicare Other | Admitting: Internal Medicine

## 2018-02-27 ENCOUNTER — Ambulatory Visit (INDEPENDENT_AMBULATORY_CARE_PROVIDER_SITE_OTHER): Payer: Medicare Other | Admitting: Dietician

## 2018-02-27 ENCOUNTER — Encounter: Payer: Self-pay | Admitting: Internal Medicine

## 2018-02-27 ENCOUNTER — Encounter: Payer: Self-pay | Admitting: Dietician

## 2018-02-27 VITALS — BP 134/80 | HR 80 | Temp 98.2°F | Wt 258.9 lb

## 2018-02-27 DIAGNOSIS — Z6841 Body Mass Index (BMI) 40.0 and over, adult: Secondary | ICD-10-CM

## 2018-02-27 DIAGNOSIS — Z713 Dietary counseling and surveillance: Secondary | ICD-10-CM | POA: Diagnosis not present

## 2018-02-27 DIAGNOSIS — F17210 Nicotine dependence, cigarettes, uncomplicated: Secondary | ICD-10-CM

## 2018-02-27 DIAGNOSIS — Z794 Long term (current) use of insulin: Secondary | ICD-10-CM | POA: Diagnosis not present

## 2018-02-27 DIAGNOSIS — E119 Type 2 diabetes mellitus without complications: Secondary | ICD-10-CM

## 2018-02-27 DIAGNOSIS — E118 Type 2 diabetes mellitus with unspecified complications: Secondary | ICD-10-CM

## 2018-02-27 LAB — POCT GLYCOSYLATED HEMOGLOBIN (HGB A1C): HEMOGLOBIN A1C: 5.8

## 2018-02-27 LAB — GLUCOSE, CAPILLARY: GLUCOSE-CAPILLARY: 117 mg/dL — AB (ref 65–99)

## 2018-02-27 MED ORDER — INSULIN DETEMIR 100 UNIT/ML FLEXPEN: 30.0000 [IU] | PEN_INJECTOR | Freq: Every day | SUBCUTANEOUS | 11 refills | Status: DC

## 2018-02-27 MED ORDER — INSULIN GLARGINE 100 UNIT/ML SOLOSTAR PEN: 25.0000 [IU] | PEN_INJECTOR | Freq: Every day | SUBCUTANEOUS | 2 refills | Status: DC

## 2018-02-27 MED ORDER — INSULIN DETEMIR 100 UNIT/ML FLEXPEN: 25.0000 [IU] | PEN_INJECTOR | Freq: Every day | SUBCUTANEOUS | 11 refills | Status: DC

## 2018-02-27 NOTE — Assessment & Plan Note (Addendum)
Patient is here for diabetes follow-up.  She is well controlled on Lantus 30 units nightly and Victoza 1.2 mg daily.  A1c 3 months ago was 6.6.  She reports checking her fasting blood glucose daily.  Unfortunately she forgot to bring her meter today, but reports readings on average in the low 100s.  She denies any symptoms of hypoglycemia.  She reports that her insurance has stopped covering her Lantus, and she has been using her sister's Lantus instead.  It appears that Levemir is preferred with her insurance.  We will change her prescription today. A1c today is 5.8. We will also decrease her long acting insulin from 30 units -> 25 units QHS. --Change Lantus to Levemir, decrease 30 ->25 units nightly --Continue Victoza --Follow-up 3 months with PCP

## 2018-02-27 NOTE — Patient Instructions (Addendum)
FOLLOW-UP INSTRUCTIONS When: 3 months For: PCP DM follow up What to bring: Meter, medications  [name],  It was a pleasure to meet you. You are doing a great job with your diabetes. Keep it up! I have changed your insulin from Lantus to Levemir, which should be covered by your insurance. Please give me a call if you cannot afford this medicine. I have decreased your insulin to 25 units at night before bed. Please continue to check your morning blood sugar daily. Also continue to take your victoza daily as previously prescribed. Please follow up with your PCP again in 3 month and bring your glucometer. If you have any questions or concerns, call our clinic at [phone] or after hours call [phone] and ask for the internal medicine resident on call. Thank you!  - Dr. Antony ContrasGuilloud

## 2018-02-27 NOTE — Patient Instructions (Signed)
Quit date for smoking- July 28-2019  Think about calling the quite line again.  You are doing great taking care of your diabetes.  Congrats on your new lower A!C  Make you diabetes training follow up in 3 months on same day you see the doctor.   I'll contact ID about omega 3 fatty acids and dental care.

## 2018-02-27 NOTE — Progress Notes (Signed)
Diabetes Self-Management Education  Visit Type:  Follow-up  Appt. Start Time: 1420 Appt. End Time: 1459  02/27/2018  [name], identified by name and date of birth, is a 57 y.o. female with a diagnosis of Diabetes:  Marland Kitchen. Type 2 diabetes  ASSESSMENT  Lab Results  Component Value Date   HGBA1C 5.8 02/27/2018   Estimated body mass index is 41.79 kg/m as calculated from the following:   Height as of 11/28/17: 5\' 6"  (1.676 m).   Weight as of an earlier encounter on 02/27/18: 258 lb 14.4 oz (117.4 kg). She is content with current weight. No plans to work on changing it.   BP Readings from Last 3 Encounters:  02/27/18 134/80  01/06/18 129/77  11/28/17 120/82    Diabetes Self-Management Education - 02/27/18 1400      Health Coping   How would you rate your overall health?  Good      Psychosocial Assessment   Patient Concerns  Support      Exercise   Exercise Type  ADL's;Light (walking / raking leaves)    How many days per week to you exercise?  6 60    How many minutes per day do you exercise?  60    Total minutes per week of exercise  360      Patient Education   Acute complications  Taught treatment of hypoglycemia - the 15 rule.    Chronic complications  Dental care    Psychosocial adjustment  Helped patient identify a support system for diabetes management    Personal strategies to promote health  Helped patient develop diabetes management plan for (enter comment) quitting smoking      Patient Self-Evaluation of Goals - Patient rates self as meeting previously set goals (% of time)   Nutrition  >75%    Reducing Risk  >75%      Outcomes   Program Status  Not Completed      Subsequent Visit   Since your last visit have you continued or begun to take your medications as prescribed?  Yes    Since your last visit have you had your blood pressure checked?  Yes    Is your most recent blood pressure lower, unchanged, or higher since your last visit?  Unchanged    Since your last visit have you experienced any weight changes?  No change    Since your last visit, are you checking your blood glucose at least once a day?  Yes       Learning Objective:  Patient will have a greater understanding of diabetes self-management. Patient education plan is to attend individual and/or group sessions per assessed needs and concerns. My plan to support myself in continuing these changes to care for my diabetes is to attend or contact:   Type 2 diabetes support group : 2nd Monday of every month from 6-7 PM at [address] [facility] conference room [phone]  -doctor's office, CDE, Dietitian, pharmacist, church  Plan:   Patient Instructions  Quit date for smoking- July 28-2019  Think about calling the quite line again.  You are doing great taking care of your diabetes.  Congrats on your new lower A!C  Make you diabetes training follow up in 3 months on same day you see the doctor.   I'll contact ID about omega 3 fatty acids and dental care.     Expected Outcomes:  Demonstrated interest in learning. Expect positive outcomes  Education material provided:  Support group flyer  If problems or questions, patient to contact team via:  Phone  Future DSME appointment: - 3-4 months  Norm Parcel, RD 02/27/2018 2:48 PM.
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
## 2018-03-03 NOTE — Progress Notes (Signed)
Internal Medicine Clinic Attending  Case discussed with Dr. Guilloud at the time of the visit.  We reviewed the resident's history and exam and pertinent patient test results.  I agree with the assessment, diagnosis, and plan of care documented in the resident's note.  

## 2018-03-28 ENCOUNTER — Telehealth: Payer: Self-pay | Admitting: Internal Medicine

## 2018-03-28 NOTE — Telephone Encounter (Signed)
Called patient to inform her that Dr. Caron Presume is working on her form to get it completed for her Shoe Request.  Upon his completion we will contact her back at let her know that the form is ready for pick up, or will will be happy to fax the form in for her.

## 2018-04-04 ENCOUNTER — Telehealth: Payer: Self-pay | Admitting: Internal Medicine

## 2018-04-04 NOTE — Telephone Encounter (Signed)
Called patient to notify her that Dr. Caron Presume has completed her [facility] Form to obtain her Diabetic shoes and Leg Brace.  No answer.

## 2018-04-09 ENCOUNTER — Other Ambulatory Visit: Payer: Self-pay | Admitting: Internal Medicine

## 2018-04-09 NOTE — Telephone Encounter (Signed)
Next appt scheduled 7/11 with PCP. 

## 2018-04-15 ENCOUNTER — Other Ambulatory Visit: Payer: Self-pay | Admitting: Internal Medicine

## 2018-05-20 DIAGNOSIS — M1611 Unilateral primary osteoarthritis, right hip: Secondary | ICD-10-CM | POA: Diagnosis not present

## 2018-05-21 ENCOUNTER — Telehealth: Payer: Self-pay | Admitting: Podiatry

## 2018-05-21 NOTE — Telephone Encounter (Signed)
This is LawyerMichelle calling from [facility]. I'm calling regarding Ms. Nicole PimpleWynn as we are holding on paperwork to be faxed over to us as Dr. Logan BoresEvans is the requesting physicians to get these papers filled out. Please call our office at [phone] and our fax number is [phone]. Thank you.

## 2018-05-22 ENCOUNTER — Encounter: Payer: Self-pay | Admitting: Internal Medicine

## 2018-05-22 ENCOUNTER — Encounter (INDEPENDENT_AMBULATORY_CARE_PROVIDER_SITE_OTHER): Payer: Self-pay

## 2018-05-22 ENCOUNTER — Ambulatory Visit (INDEPENDENT_AMBULATORY_CARE_PROVIDER_SITE_OTHER): Payer: Medicare Other | Admitting: Internal Medicine

## 2018-05-22 ENCOUNTER — Other Ambulatory Visit: Payer: Self-pay

## 2018-05-22 VITALS — BP 122/61 | HR 75 | Temp 98.2°F | Ht 64.0 in | Wt 261.3 lb

## 2018-05-22 DIAGNOSIS — Z1239 Encounter for other screening for malignant neoplasm of breast: Secondary | ICD-10-CM

## 2018-05-22 DIAGNOSIS — E119 Type 2 diabetes mellitus without complications: Secondary | ICD-10-CM | POA: Diagnosis not present

## 2018-05-22 DIAGNOSIS — B20 Human immunodeficiency virus [HIV] disease: Secondary | ICD-10-CM | POA: Diagnosis not present

## 2018-05-22 DIAGNOSIS — I10 Essential (primary) hypertension: Secondary | ICD-10-CM

## 2018-05-22 DIAGNOSIS — E118 Type 2 diabetes mellitus with unspecified complications: Secondary | ICD-10-CM

## 2018-05-22 DIAGNOSIS — Z0000 Encounter for general adult medical examination without abnormal findings: Secondary | ICD-10-CM

## 2018-05-22 LAB — POCT GLYCOSYLATED HEMOGLOBIN (HGB A1C): Hemoglobin A1C: 5.7 % — AB (ref 4.0–5.6)

## 2018-05-22 LAB — GLUCOSE, CAPILLARY: Glucose-Capillary: 102 mg/dL — ABNORMAL HIGH (ref 70–99)

## 2018-05-22 MED ORDER — INSULIN DETEMIR 100 UNIT/ML FLEXPEN: 20.0000 [IU] | PEN_INJECTOR | Freq: Every day | SUBCUTANEOUS | 2 refills | Status: DC
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
## 2018-05-22 NOTE — Patient Instructions (Signed)
Thank you for allowing us to provide your care. Keep up the great work with the weight loss. Today we are doing the following:  1. Decrease you Levemir to 20 units daily   2. Come back in 6 months

## 2018-05-22 NOTE — Assessment & Plan Note (Signed)
Patient due for screening mammogram. Will place order.

## 2018-05-22 NOTE — Progress Notes (Signed)
Internal Medicine Clinic Attending  Case discussed with Dr. Helberg at the time of the visit.  We reviewed the resident's history and exam and pertinent patient test results.  I agree with the assessment, diagnosis, and plan of care documented in the resident's note.    

## 2018-05-22 NOTE — Assessment & Plan Note (Signed)
Patient's BP is currently at goal. She is taking her lisinopril-HCTZ as prescribed. Her weight is stable from prior exam. She is able to ambulate without SOB, palpitations, or chest pain. She is experiencing some LE edema that gets better with elevation.   Plan: - Continue Lisinopril-HCTZ daily

## 2018-05-22 NOTE — Assessment & Plan Note (Signed)
Follows with Dr. Orvan Falconerampbell. Well controlled on [place]. Follow-up with ID in August.

## 2018-05-22 NOTE — Assessment & Plan Note (Signed)
Patient's DM is currently well controlled. She is taking Levemir 25 units QD and Victoza 1.2 mg QD. She checks her fasting CBG every morning and it averages around 100. She denies symptoms of hypoglycemia including dizziness, palpitations, diaphoresis, shaking. She continues to work on weight loss.   Plan: - Well controlled, will try to reduce medications. Decrease Levemir to 20 units QD. Will continue to step down.  - Continue Victoza 1.2 mg QD

## 2018-05-22 NOTE — Telephone Encounter (Signed)
Faxed note to resend to our office.

## 2018-05-28 ENCOUNTER — Telehealth: Payer: Self-pay

## 2018-05-28 NOTE — Telephone Encounter (Signed)
Pt called today requesting to reschedule her lab appt for an earlier day and time was able to have pt scheduled for labs on 05/29/18. PT is aware of this appt and will come into clinic for this appt. Lorenso CourierJose L Tinley Rought, [place]

## 2018-05-29 ENCOUNTER — Other Ambulatory Visit: Payer: Medicare Other

## 2018-05-29 DIAGNOSIS — B20 Human immunodeficiency virus [HIV] disease: Secondary | ICD-10-CM | POA: Diagnosis not present

## 2018-05-30 ENCOUNTER — Telehealth: Payer: Self-pay | Admitting: Internal Medicine

## 2018-05-30 LAB — COMPREHENSIVE METABOLIC PANEL
AG RATIO: 1.3 (calc) (ref 1.0–2.5)
ALT: 11 U/L (ref 6–29)
AST: 14 U/L (ref 10–35)
Albumin: 4.1 g/dL (ref 3.6–5.1)
Alkaline phosphatase (APISO): 58 U/L (ref 33–130)
BUN: 11 mg/dL (ref 7–25)
CHLORIDE: 106 mmol/L (ref 98–110)
CO2: 27 mmol/L (ref 20–32)
Calcium: 9.4 mg/dL (ref 8.6–10.4)
Creat: 0.81 mg/dL (ref 0.50–1.05)
GLOBULIN: 3.2 g/dL (ref 1.9–3.7)
GLUCOSE: 95 mg/dL (ref 65–99)
POTASSIUM: 4 mmol/L (ref 3.5–5.3)
Sodium: 140 mmol/L (ref 135–146)
Total Bilirubin: 0.3 mg/dL (ref 0.2–1.2)
Total Protein: 7.3 g/dL (ref 6.1–8.1)

## 2018-05-30 LAB — CBC
HEMATOCRIT: 40.9 % (ref 35.0–45.0)
Hemoglobin: 13.8 g/dL (ref 11.7–15.5)
MCH: 29.9 pg (ref 27.0–33.0)
MCHC: 33.7 g/dL (ref 32.0–36.0)
MCV: 88.7 fL (ref 80.0–100.0)
MPV: 11.5 fL (ref 7.5–12.5)
PLATELETS: 218 10*3/uL (ref 140–400)
RBC: 4.61 10*6/uL (ref 3.80–5.10)
RDW: 13.1 % (ref 11.0–15.0)
WBC: 9 10*3/uL (ref 3.8–10.8)

## 2018-05-30 LAB — RPR: RPR: NONREACTIVE

## 2018-05-30 LAB — T-HELPER CELL (CD4) - (RCID CLINIC ONLY)
CD4 % Helper T Cell: 43 % (ref 33–55)
CD4 T Cell Abs: 1440 /uL (ref 400–2700)

## 2018-05-31 LAB — HIV-1 RNA QUANT-NO REFLEX-BLD
HIV 1 RNA QUANT: 25 {copies}/mL — AB
HIV-1 RNA QUANT, LOG: 1.4 {Log_copies}/mL — AB

## 2018-06-03 ENCOUNTER — Other Ambulatory Visit: Payer: Medicare Other

## 2018-06-11 ENCOUNTER — Other Ambulatory Visit: Payer: Self-pay | Admitting: Orthopedic Surgery

## 2018-06-17 ENCOUNTER — Encounter: Payer: Self-pay | Admitting: Internal Medicine

## 2018-06-17 ENCOUNTER — Ambulatory Visit (INDEPENDENT_AMBULATORY_CARE_PROVIDER_SITE_OTHER): Payer: Medicare Other | Admitting: Internal Medicine

## 2018-06-17 DIAGNOSIS — B20 Human immunodeficiency virus [HIV] disease: Secondary | ICD-10-CM
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
## 2018-06-17 NOTE — Assessment & Plan Note (Signed)
Her infection remains under excellent long-term control.  She will continue Genvoya and follow-up after blood work in 1 year.  We will schedule her to be seen in the dental clinic.

## 2018-06-19 NOTE — Patient Instructions (Signed)
MINELA BRIDGEWATER  06/19/2018   Your procedure is scheduled on: Monday 06/30/2018  Report to [facility] Main  Entrance              Report to admitting at 0930  AM    Call this number if you have problems the morning of surgery [phone]    Remember: Do not eat food or drink liquids :After Midnight.   How to Manage Your Diabetes Before and After Surgery  Why is it important to control my blood sugar before and after surgery? . Improving blood sugar levels before and after surgery helps healing and can limit problems. . A way of improving blood sugar control is eating a healthy diet by: o  Eating less sugar and carbohydrates o  Increasing activity/exercise o  Talking with your doctor about reaching your blood sugar goals . High blood sugars (greater than 180 mg/dL) can raise your risk of infections and slow your recovery, so you will need to focus on controlling your diabetes during the weeks before surgery. . Make sure that the doctor who takes care of your diabetes knows about your planned surgery including the date and location.  How do I manage my blood sugar before surgery? . Check your blood sugar at least 4 times a day, starting 2 days before surgery, to make sure that the level is not too high or low. o Check your blood sugar the morning of your surgery when you wake up and every 2 hours until you get to the Short Stay unit. . If your blood sugar is less than 70 mg/dL, you will need to treat for low blood sugar: o Do not take insulin. o Treat a low blood sugar (less than 70 mg/dL) with  cup of clear juice (cranberry or apple), 4 glucose tablets, OR glucose gel. o Recheck blood sugar in 15 minutes after treatment (to make sure it is greater than 70 mg/dL). If your blood sugar is not greater than 70 mg/dL on recheck, call [phone] for further instructions. . Report your blood sugar to the short stay nurse when you get to Short Stay.  . If you are  admitted to the hospital after surgery: o Your blood sugar will be checked by the staff and you will probably be given insulin after surgery (instead of oral diabetes medicines) to make sure you have good blood sugar levels. o The goal for blood sugar control after surgery is 80-180 mg/dL.   WHAT DO I DO ABOUT MY DIABETES MEDICATION?        Marland Kitchen Do not take oral diabetes medicines (pills) the morning of surgery.  . THE NIGHT BEFORE SURGERY, take  10   units of  Levemir       insulin.       . The day of surgery, do not take other diabetes injectables, including Byetta (exenatide), Bydureon (exenatide ER), Victoza (liraglutide), or Trulicity (dulaglutide).       Take these medicines the morning of surgery with A SIP OF WATER: Genvoya  DO NOT TAKE ANY DIABETIC MEDICATIONS DAY OF YOUR SURGERY!                               You may not have any metal on your body including hair pins and  piercings  Do not wear jewelry, make-up, lotions, powders or perfumes, deodorant             Do not wear nail polish.  Do not shave  48 hours prior to surgery.               Do not bring valuables to the hospital. [facility] IS NOT             RESPONSIBLE   FOR VALUABLES.  Contacts, dentures or bridgework may not be worn into surgery.  Leave suitcase in the car. After surgery it may be brought to your room.                  Please read over the following fact sheets you were given: _____________________________________________________________________             [facility] Pre[facility] Before surgery, you can play an important role.  Because skin is not sterile, your skin needs to be as free of germs as possible.  You can reduce the number of germs on your skin by washing with CHG (chlorahexidine gluconate) soap before surgery.  CHG is an antiseptic cleaner which kills germs and bonds with the skin to continue killing germs even after washing. Please DO NOT use if you have  an allergy to CHG or antibacterial soaps.  If your skin becomes reddened/irritated stop using the CHG and inform your nurse when you arrive at Short Stay. Do not shave (including legs and underarms) for at least 48 hours prior to the first CHG shower.  You may shave your face/neck. Please follow these instructions carefully:  1.  Shower with CHG Soap the night before surgery and the  morning of Surgery.  2.  If you choose to wash your hair, wash your hair first as usual with your  normal  shampoo.  3.  After you shampoo, rinse your hair and body thoroughly to remove the  shampoo.                           4.  Use CHG as you would any other liquid soap.  You can apply chg directly  to the skin and wash                       Gently with a scrungie or clean washcloth.  5.  Apply the CHG Soap to your body ONLY FROM THE NECK DOWN.   Do not use on face/ open                           Wound or open sores. Avoid contact with eyes, ears mouth and genitals (private parts).                       Wash face,  Genitals (private parts) with your normal soap.             6.  Wash thoroughly, paying special attention to the area where your surgery  will be performed.  7.  Thoroughly rinse your body with warm water from the neck down.  8.  DO NOT shower/wash with your normal soap after using and rinsing off  the CHG Soap.                9.  Pat yourself dry with a clean towel.  10.  Wear clean pajamas.            11.  Place clean sheets on your bed the night of your first shower and do not  sleep with pets. Day of Surgery : Do not apply any lotions/deodorants the morning of surgery.  Please wear clean clothes to the hospital/surgery center.  FAILURE TO FOLLOW THESE INSTRUCTIONS MAY RESULT IN THE CANCELLATION OF YOUR SURGERY PATIENT SIGNATURE_________________________________  NURSE  SIGNATURE__________________________________  ________________________________________________________________________   Adam Phenix  An incentive spirometer is a tool that can help keep your lungs clear and active. This tool measures how well you are filling your lungs with each breath. Taking long deep breaths may help reverse or decrease the chance of developing breathing (pulmonary) problems (especially infection) following:  A long period of time when you are unable to move or be active. BEFORE THE PROCEDURE   If the spirometer includes an indicator to show your best effort, your nurse or respiratory therapist will set it to a desired goal.  If possible, sit up straight or lean slightly forward. Try not to slouch.  Hold the incentive spirometer in an upright position. INSTRUCTIONS FOR USE  1. Sit on the edge of your bed if possible, or sit up as far as you can in bed or on a chair. 2. Hold the incentive spirometer in an upright position. 3. Breathe out normally. 4. Place the mouthpiece in your mouth and seal your lips tightly around it. 5. Breathe in slowly and as deeply as possible, raising the piston or the ball toward the top of the column. 6. Hold your breath for 3-5 seconds or for as long as possible. Allow the piston or ball to fall to the bottom of the column. 7. Remove the mouthpiece from your mouth and breathe out normally. 8. Rest for a few seconds and repeat Steps 1 through 7 at least 10 times every 1-2 hours when you are awake. Take your time and take a few normal breaths between deep breaths. 9. The spirometer may include an indicator to show your best effort. Use the indicator as a goal to work toward during each repetition. 10. After each set of 10 deep breaths, practice coughing to be sure your lungs are clear. If you have an incision (the cut made at the time of surgery), support your incision when coughing by placing a pillow or rolled up towels firmly  against it. Once you are able to get out of bed, walk around indoors and cough well. You may stop using the incentive spirometer when instructed by your caregiver.  RISKS AND COMPLICATIONS  Take your time so you do not get dizzy or light-headed.  If you are in pain, you may need to take or ask for pain medication before doing incentive spirometry. It is harder to take a deep breath if you are having pain. AFTER USE  Rest and breathe slowly and easily.  It can be helpful to keep track of a log of your progress. Your caregiver can provide you with a simple table to help with this. If you are using the spirometer at home, follow these instructions: [place] IF:   You are having difficultly using the spirometer.  You have trouble using the spirometer as often as instructed.  Your pain medication is not giving enough relief while using the spirometer.  You develop fever of 100.5 F (38.1 C) or higher. SEEK IMMEDIATE MEDICAL CARE IF:   You cough up bloody  sputum that had not been present before.  You develop fever of 102 F (38.9 C) or greater.  You develop worsening pain at or near the incision site. MAKE SURE YOU:   Understand these instructions.  Will watch your condition.  Will get help right away if you are not doing well or get worse. Document Released: 03/11/2007 Document Revised: 01/21/2012 Document Reviewed: 05/12/2007 [facility] Patient Information 2014 [place], [place].   ________________________________________________________________________

## 2018-06-20 ENCOUNTER — Other Ambulatory Visit: Payer: Self-pay

## 2018-06-20 ENCOUNTER — Ambulatory Visit (HOSPITAL_COMMUNITY)
Admission: RE | Admit: 2018-06-20 | Discharge: 2018-06-20 | Disposition: A | Discharge status: Home / Self Care | Payer: Medicare Other | Source: Ambulatory Visit | Attending: Orthopedic Surgery | Admitting: Orthopedic Surgery

## 2018-06-20 ENCOUNTER — Encounter (HOSPITAL_COMMUNITY)
Admission: RE | Admit: 2018-06-20 | Discharge: 2018-06-20 | Disposition: A | Discharge status: Home / Self Care | Payer: Medicare Other | Source: Ambulatory Visit | Attending: Orthopedic Surgery | Admitting: Orthopedic Surgery

## 2018-06-20 DIAGNOSIS — M1611 Unilateral primary osteoarthritis, right hip: Secondary | ICD-10-CM

## 2018-06-20 DIAGNOSIS — Z01818 Encounter for other preprocedural examination: Secondary | ICD-10-CM | POA: Diagnosis not present

## 2018-06-20 DIAGNOSIS — R9431 Abnormal electrocardiogram [ECG] [EKG]: Secondary | ICD-10-CM | POA: Insufficient documentation

## 2018-06-20 HISTORY — DX: Unilateral primary osteoarthritis, right hip: M16.11

## 2018-06-20 LAB — CBC WITH DIFFERENTIAL/PLATELET
BASOS PCT: 0 %
Basophils Absolute: 0 10*3/uL (ref 0.0–0.1)
Eosinophils Absolute: 0.3 10*3/uL (ref 0.0–0.7)
Eosinophils Relative: 3 %
HEMATOCRIT: 40.3 % (ref 36.0–46.0)
HEMOGLOBIN: 13.7 g/dL (ref 12.0–15.0)
Lymphocytes Relative: 38 %
Lymphs Abs: 3.5 10*3/uL (ref 0.7–4.0)
MCH: 30.9 pg (ref 26.0–34.0)
MCHC: 34 g/dL (ref 30.0–36.0)
MCV: 90.8 fL (ref 78.0–100.0)
MONOS PCT: 7 %
Monocytes Absolute: 0.6 10*3/uL (ref 0.1–1.0)
NEUTROS ABS: 4.6 10*3/uL (ref 1.7–7.7)
NEUTROS PCT: 52 %
Platelets: 252 10*3/uL (ref 150–400)
RBC: 4.44 MIL/uL (ref 3.87–5.11)
RDW: 13 % (ref 11.5–15.5)
WBC: 9 10*3/uL (ref 4.0–10.5)

## 2018-06-20 LAB — URINALYSIS, ROUTINE W REFLEX MICROSCOPIC
Bilirubin Urine: NEGATIVE
GLUCOSE, UA: NEGATIVE mg/dL
HGB URINE DIPSTICK: NEGATIVE
Ketones, ur: NEGATIVE mg/dL
Leukocytes, UA: NEGATIVE
Nitrite: NEGATIVE
PH: 5 (ref 5.0–8.0)
Protein, ur: NEGATIVE mg/dL
SPECIFIC GRAVITY, URINE: 1.01 (ref 1.005–1.030)

## 2018-06-20 LAB — BASIC METABOLIC PANEL
ANION GAP: 9 (ref 5–15)
BUN: 9 mg/dL (ref 6–20)
CHLORIDE: 107 mmol/L (ref 98–111)
CO2: 24 mmol/L (ref 22–32)
Calcium: 9.2 mg/dL (ref 8.9–10.3)
Creatinine, Ser: 0.59 mg/dL (ref 0.44–1.00)
GFR calc non Af Amer: >60 mL/min (ref >60)
Glucose, Bld: 80 mg/dL (ref 70–99)
POTASSIUM: 3.8 mmol/L (ref 3.5–5.1)
Sodium: 140 mmol/L (ref 135–145)

## 2018-06-20 LAB — GLUCOSE, CAPILLARY: Glucose-Capillary: 72 mg/dL (ref 70–99)

## 2018-06-20 LAB — SURGICAL PCR SCREEN
MRSA, PCR: NEGATIVE
Staphylococcus aureus: NEGATIVE

## 2018-06-20 LAB — PROTIME-INR
INR: 0.99
Prothrombin Time: 13 seconds (ref 11.4–15.2)

## 2018-06-20 LAB — ABO/RH: ABO/RH(D): A POS

## 2018-06-20 LAB — APTT: aPTT: 29 seconds (ref 24–36)
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
## 2018-06-20 NOTE — Progress Notes (Signed)
   06/20/18 1023  OBSTRUCTIVE SLEEP APNEA  Have you ever been diagnosed with sleep apnea through a sleep study? No  Do you snore loudly (loud enough to be heard through closed doors)?  1  Do you often feel tired, fatigued, or sleepy during the daytime (such as falling asleep during driving or talking to someone)? 0  Has anyone observed you stop breathing during your sleep? 0  Do you have, or are you being treated for high blood pressure? 1  BMI more than 35 kg/m2? 1  Age > 50 (1-yes) 1  Neck circumference greater than:Female 16 inches or larger, Female 17inches or larger? 1  Female Gender (Yes=1) 0  Obstructive Sleep Apnea Score 5  Score 5 or greater  Results sent to PCP

## 2018-06-23 ENCOUNTER — Other Ambulatory Visit: Payer: Self-pay | Admitting: Orthopedic Surgery

## 2018-06-23 NOTE — Care Plan (Signed)
Spoke with patient prior to surgery. She is known to me from prior surgery. She has no caregiver at home and will requires SNF rehab at discharge. She is pre arranged to go to [facility] after her 3 day qualifying stay.  She has all needed equipment at home    Please contact Shauna HughRenee Angiulli, RNCM, [phone] with questions or if this plan should need to change   Thanks

## 2018-06-23 NOTE — Progress Notes (Addendum)
Spoke to Dr. Arby BarretteHatchett, MDA face to face concerning EKG from 06/19/2018. Per Dr. Richardson LandryHouser, EKG is OK for surgery on 06/30/2018.

## 2018-06-24 DIAGNOSIS — E119 Type 2 diabetes mellitus without complications: Secondary | ICD-10-CM | POA: Diagnosis not present

## 2018-06-24 DIAGNOSIS — I10 Essential (primary) hypertension: Secondary | ICD-10-CM | POA: Diagnosis not present

## 2018-06-24 DIAGNOSIS — B20 Human immunodeficiency virus [HIV] disease: Secondary | ICD-10-CM | POA: Diagnosis not present

## 2018-06-24 DIAGNOSIS — Z01810 Encounter for preprocedural cardiovascular examination: Secondary | ICD-10-CM | POA: Diagnosis not present

## 2018-06-24 NOTE — Progress Notes (Signed)
Spoke with patient and informed her to arrive at [facility] on Monday 06/30/2018 at 0700 am. NPO after midnight and follow instructions she was given about medications to take morning of  surgery. Patient verbalized understanding.

## 2018-06-29 MED ORDER — SODIUM CHLORIDE 0.9 % IV SOLN
2000.0000 mg | INTRAVENOUS | Status: DC
  Filled 2018-06-29: qty 20

## 2018-06-29 MED ORDER — BUPIVACAINE LIPOSOME 1.3 % IJ SUSP
10.0000 mL | INTRAMUSCULAR | Status: DC
  Filled 2018-06-29: qty 20

## 2018-06-29 MED ORDER — VANCOMYCIN HCL 10 G IV SOLR
1500.0000 mg | INTRAVENOUS | Status: AC
  Administered 2018-06-30: 1500 mg via INTRAVENOUS
  Filled 2018-06-29: qty 1500

## 2018-06-30 ENCOUNTER — Inpatient Hospital Stay (HOSPITAL_COMMUNITY): Payer: Medicare Other | Admitting: Certified Registered Nurse Anesthetist

## 2018-06-30 ENCOUNTER — Inpatient Hospital Stay (HOSPITAL_COMMUNITY)
Admission: RE | Admit: 2018-06-30 | Discharge: 2018-07-03 | DRG: 470 | Disposition: A | Discharge status: Skilled Nursing Facility | Payer: Medicare Other | Attending: Orthopedic Surgery | Admitting: Orthopedic Surgery

## 2018-06-30 ENCOUNTER — Inpatient Hospital Stay (HOSPITAL_COMMUNITY): Payer: Medicare Other

## 2018-06-30 ENCOUNTER — Encounter (HOSPITAL_COMMUNITY)
Admission: RE | Disposition: A | Discharge status: Skilled Nursing Facility | Payer: Self-pay | Source: Home / Self Care | Attending: Orthopedic Surgery

## 2018-06-30 ENCOUNTER — Encounter (HOSPITAL_COMMUNITY): Payer: Self-pay | Admitting: *Deleted

## 2018-06-30 ENCOUNTER — Other Ambulatory Visit: Payer: Self-pay

## 2018-06-30 DIAGNOSIS — I10 Essential (primary) hypertension: Secondary | ICD-10-CM | POA: Diagnosis present

## 2018-06-30 DIAGNOSIS — E119 Type 2 diabetes mellitus without complications: Secondary | ICD-10-CM | POA: Diagnosis not present

## 2018-06-30 DIAGNOSIS — D62 Acute posthemorrhagic anemia: Secondary | ICD-10-CM | POA: Diagnosis not present

## 2018-06-30 DIAGNOSIS — M1611 Unilateral primary osteoarthritis, right hip: Secondary | ICD-10-CM | POA: Diagnosis not present

## 2018-06-30 DIAGNOSIS — Z471 Aftercare following joint replacement surgery: Secondary | ICD-10-CM | POA: Diagnosis not present

## 2018-06-30 DIAGNOSIS — M6281 Muscle weakness (generalized): Secondary | ICD-10-CM | POA: Diagnosis not present

## 2018-06-30 DIAGNOSIS — Z743 Need for continuous supervision: Secondary | ICD-10-CM | POA: Diagnosis not present

## 2018-06-30 DIAGNOSIS — F17210 Nicotine dependence, cigarettes, uncomplicated: Secondary | ICD-10-CM | POA: Diagnosis present

## 2018-06-30 DIAGNOSIS — Z96653 Presence of artificial knee joint, bilateral: Secondary | ICD-10-CM | POA: Diagnosis not present

## 2018-06-30 DIAGNOSIS — Z6841 Body Mass Index (BMI) 40.0 and over, adult: Secondary | ICD-10-CM

## 2018-06-30 DIAGNOSIS — B20 Human immunodeficiency virus [HIV] disease: Secondary | ICD-10-CM | POA: Diagnosis present

## 2018-06-30 DIAGNOSIS — Z96642 Presence of left artificial hip joint: Secondary | ICD-10-CM | POA: Diagnosis present

## 2018-06-30 DIAGNOSIS — J309 Allergic rhinitis, unspecified: Secondary | ICD-10-CM | POA: Diagnosis not present

## 2018-06-30 DIAGNOSIS — Z794 Long term (current) use of insulin: Secondary | ICD-10-CM

## 2018-06-30 DIAGNOSIS — R279 Unspecified lack of coordination: Secondary | ICD-10-CM | POA: Diagnosis not present

## 2018-06-30 DIAGNOSIS — M25551 Pain in right hip: Secondary | ICD-10-CM | POA: Diagnosis not present

## 2018-06-30 DIAGNOSIS — Z96641 Presence of right artificial hip joint: Secondary | ICD-10-CM | POA: Diagnosis not present

## 2018-06-30 HISTORY — PX: TOTAL HIP ARTHROPLASTY: SHX124

## 2018-06-30 HISTORY — DX: Unilateral primary osteoarthritis, right hip: M16.11

## 2018-06-30 LAB — GLUCOSE, CAPILLARY
Glucose-Capillary: 100 mg/dL — ABNORMAL HIGH (ref 70–99)
Glucose-Capillary: 75 mg/dL (ref 70–99)
Glucose-Capillary: 135 mg/dL — ABNORMAL HIGH (ref 70–99)
Glucose-Capillary: 133 mg/dL — ABNORMAL HIGH (ref 70–99)

## 2018-06-30 LAB — HEMOGLOBIN A1C
Hgb A1c MFr Bld: 5.6 % (ref 4.8–5.6)
Mean Plasma Glucose: 114.02 mg/dL

## 2018-06-30 LAB — TYPE AND SCREEN
ABO/RH(D): A POS
Antibody Screen: NEGATIVE

## 2018-06-30 SURGERY — ARTHROPLASTY, HIP, TOTAL, ANTERIOR APPROACH
Anesthesia: Monitor Anesthesia Care | Site: Hip | Laterality: Right

## 2018-06-30 MED ORDER — NEOMYCIN-POLYMYXIN-DEXAMETH 3.5-10000-0.1 OP OINT
1.0000 "application " | TOPICAL_OINTMENT | Freq: Every day | OPHTHALMIC | Status: DC
  Administered 2018-06-30 – 2018-07-02 (×3): 1 via OPHTHALMIC
  Filled 2018-06-30: qty 3.5

## 2018-06-30 MED ORDER — INSULIN ASPART 100 UNIT/ML ~~LOC~~ SOLN
0.0000 [IU] | Freq: Three times a day (TID) | SUBCUTANEOUS | Status: DC
  Administered 2018-06-30: 2 [IU] via SUBCUTANEOUS
  Administered 2018-07-01: 8 [IU] via SUBCUTANEOUS
  Administered 2018-07-01 (×2): 2 [IU] via SUBCUTANEOUS
  Administered 2018-07-02 (×3): 5 [IU] via SUBCUTANEOUS
  Administered 2018-07-03: 3 [IU] via SUBCUTANEOUS
  Administered 2018-07-03: 2 [IU] via SUBCUTANEOUS

## 2018-06-30 MED ORDER — METOCLOPRAMIDE HCL 5 MG/ML IJ SOLN: 5.0000 mg | Freq: Three times a day (TID) | INTRAMUSCULAR | Status: DC | PRN

## 2018-06-30 MED ORDER — HYDROCHLOROTHIAZIDE 12.5 MG PO CAPS
12.5000 mg | ORAL_CAPSULE | Freq: Every day | ORAL | Status: DC
  Administered 2018-06-30 – 2018-07-03 (×4): 12.5 mg via ORAL
  Filled 2018-06-30 (×4): qty 1

## 2018-06-30 MED ORDER — BUPIVACAINE LIPOSOME 1.3 % IJ SUSP
INTRAMUSCULAR | Status: DC | PRN
  Administered 2018-06-30: 20 mL

## 2018-06-30 MED ORDER — PHENYLEPHRINE HCL 10 MG/ML IJ SOLN
INTRAMUSCULAR | Status: DC | PRN
  Administered 2018-06-30: 25 ug/min via INTRAVENOUS

## 2018-06-30 MED ORDER — CELECOXIB 200 MG PO CAPS
200.0000 mg | ORAL_CAPSULE | Freq: Two times a day (BID) | ORAL | Status: DC
  Administered 2018-06-30 – 2018-07-03 (×6): 200 mg via ORAL
  Filled 2018-06-30 (×6): qty 1

## 2018-06-30 MED ORDER — TRANEXAMIC ACID 1000 MG/10ML IV SOLN
INTRAVENOUS | Status: DC | PRN
  Administered 2018-06-30: 2000 mg via TOPICAL

## 2018-06-30 MED ORDER — ONDANSETRON HCL 4 MG/2ML IJ SOLN: 4.0000 mg | Freq: Four times a day (QID) | INTRAMUSCULAR | Status: DC | PRN

## 2018-06-30 MED ORDER — ASPIRIN EC 81 MG PO TBEC: 81.0000 mg | DELAYED_RELEASE_TABLET | Freq: Two times a day (BID) | ORAL | 0 refills | Status: DC

## 2018-06-30 MED ORDER — HYDROMORPHONE HCL 1 MG/ML IJ SOLN
0.5000 mg | INTRAMUSCULAR | Status: DC | PRN
  Administered 2018-06-30: 1 mg via INTRAVENOUS
  Filled 2018-06-30: qty 1

## 2018-06-30 MED ORDER — GABAPENTIN 300 MG PO CAPS
300.0000 mg | ORAL_CAPSULE | Freq: Three times a day (TID) | ORAL | Status: DC
  Administered 2018-06-30 – 2018-07-03 (×9): 300 mg via ORAL
  Filled 2018-06-30 (×9): qty 1

## 2018-06-30 MED ORDER — TRANEXAMIC ACID 1000 MG/10ML IV SOLN
1000.0000 mg | Freq: Once | INTRAVENOUS | Status: AC
  Administered 2018-06-30: 1000 mg via INTRAVENOUS
  Filled 2018-06-30: qty 1000

## 2018-06-30 MED ORDER — OXYCODONE HCL 5 MG PO TABS
5.0000 mg | ORAL_TABLET | ORAL | Status: DC | PRN
  Administered 2018-06-30: 10 mg via ORAL
  Administered 2018-07-01 – 2018-07-02 (×2): 5 mg via ORAL
  Filled 2018-06-30: qty 1
  Filled 2018-06-30: qty 2
  Filled 2018-06-30: qty 1

## 2018-06-30 MED ORDER — POLYETHYLENE GLYCOL 3350 17 G PO PACK
17.0000 g | PACK | Freq: Every day | ORAL | Status: DC | PRN
  Administered 2018-07-02: 17 g via ORAL
  Filled 2018-06-30: qty 1

## 2018-06-30 MED ORDER — FLUTICASONE PROPIONATE 50 MCG/ACT NA SUSP
1.0000 | Freq: Every day | NASAL | Status: DC | PRN
  Filled 2018-06-30: qty 16

## 2018-06-30 MED ORDER — ELVITEG-COBIC-EMTRICIT-TENOFAF 150-150-200-10 MG PO TABS
1.0000 | ORAL_TABLET | Freq: Every day | ORAL | Status: DC
  Administered 2018-07-01 – 2018-07-03 (×3): 1 via ORAL
  Filled 2018-06-30 (×3): qty 1

## 2018-06-30 MED ORDER — LISINOPRIL-HYDROCHLOROTHIAZIDE 20-12.5 MG PO TABS: 1.0000 | ORAL_TABLET | Freq: Every day | ORAL | Status: DC

## 2018-06-30 MED ORDER — BUPIVACAINE-EPINEPHRINE (PF) 0.5% -1:200000 IJ SOLN
INTRAMUSCULAR | Status: AC
  Filled 2018-06-30: qty 30

## 2018-06-30 MED ORDER — FENTANYL CITRATE (PF) 100 MCG/2ML IJ SOLN
INTRAMUSCULAR | Status: AC
  Filled 2018-06-30: qty 2

## 2018-06-30 MED ORDER — INSULIN DETEMIR 100 UNIT/ML ~~LOC~~ SOLN
20.0000 [IU] | Freq: Every day | SUBCUTANEOUS | Status: DC
  Administered 2018-06-30 – 2018-07-02 (×3): 20 [IU] via SUBCUTANEOUS
  Filled 2018-06-30 (×4): qty 0.2

## 2018-06-30 MED ORDER — PANTOPRAZOLE SODIUM 40 MG PO TBEC
40.0000 mg | DELAYED_RELEASE_TABLET | Freq: Every day | ORAL | Status: DC
  Administered 2018-06-30 – 2018-07-03 (×4): 40 mg via ORAL
  Filled 2018-06-30 (×4): qty 1

## 2018-06-30 MED ORDER — ONDANSETRON HCL 4 MG/2ML IJ SOLN
INTRAMUSCULAR | Status: DC | PRN
  Administered 2018-06-30: 4 mg via INTRAVENOUS

## 2018-06-30 MED ORDER — ASPIRIN 81 MG PO CHEW
81.0000 mg | CHEWABLE_TABLET | Freq: Two times a day (BID) | ORAL | Status: DC
  Administered 2018-06-30 – 2018-07-03 (×6): 81 mg via ORAL
  Filled 2018-06-30 (×6): qty 1

## 2018-06-30 MED ORDER — DEXAMETHASONE SODIUM PHOSPHATE 10 MG/ML IJ SOLN
10.0000 mg | Freq: Once | INTRAMUSCULAR | Status: AC
  Administered 2018-07-01: 10 mg via INTRAVENOUS
  Filled 2018-06-30: qty 1

## 2018-06-30 MED ORDER — METHOCARBAMOL 500 MG IVPB - SIMPLE MED
500.0000 mg | Freq: Four times a day (QID) | INTRAVENOUS | Status: DC | PRN
  Administered 2018-06-30: 500 mg via INTRAVENOUS
  Filled 2018-06-30 (×2): qty 50

## 2018-06-30 MED ORDER — TIZANIDINE HCL 2 MG PO TABS: 2.0000 mg | ORAL_TABLET | Freq: Four times a day (QID) | ORAL | 0 refills | Status: DC | PRN

## 2018-06-30 MED ORDER — OXYCODONE HCL 5 MG PO TABS: 5.0000 mg | ORAL_TABLET | Freq: Once | ORAL | Status: DC | PRN

## 2018-06-30 MED ORDER — LISINOPRIL 20 MG PO TABS
20.0000 mg | ORAL_TABLET | Freq: Every day | ORAL | Status: DC
  Administered 2018-06-30 – 2018-07-03 (×4): 20 mg via ORAL
  Filled 2018-06-30 (×4): qty 1

## 2018-06-30 MED ORDER — PHENOL 1.4 % MT LIQD: 1.0000 | OROMUCOSAL | Status: DC | PRN

## 2018-06-30 MED ORDER — OXYCODONE-ACETAMINOPHEN 5-325 MG PO TABS: 1.0000 | ORAL_TABLET | ORAL | 0 refills | Status: DC | PRN

## 2018-06-30 MED ORDER — FENTANYL CITRATE (PF) 100 MCG/2ML IJ SOLN
INTRAMUSCULAR | Status: DC | PRN
  Administered 2018-06-30 (×2): 50 ug via INTRAVENOUS

## 2018-06-30 MED ORDER — DOCUSATE SODIUM 100 MG PO CAPS
100.0000 mg | ORAL_CAPSULE | Freq: Two times a day (BID) | ORAL | Status: DC
  Administered 2018-06-30 – 2018-07-03 (×6): 100 mg via ORAL
  Filled 2018-06-30 (×6): qty 1

## 2018-06-30 MED ORDER — KCL IN DEXTROSE-NACL 20-5-0.45 MEQ/L-%-% IV SOLN
INTRAVENOUS | Status: DC
  Administered 2018-06-30: 15:00:00 via INTRAVENOUS
  Filled 2018-06-30 (×2): qty 1000

## 2018-06-30 MED ORDER — BISACODYL 5 MG PO TBEC: 5.0000 mg | DELAYED_RELEASE_TABLET | Freq: Every day | ORAL | Status: DC | PRN

## 2018-06-30 MED ORDER — PROPOFOL 10 MG/ML IV BOLUS
INTRAVENOUS | Status: AC
  Filled 2018-06-30: qty 20

## 2018-06-30 MED ORDER — SODIUM CHLORIDE 0.9% FLUSH
INTRAVENOUS | Status: DC | PRN
  Administered 2018-06-30: 50 mL

## 2018-06-30 MED ORDER — LACTATED RINGERS IV SOLN: INTRAVENOUS | Status: DC

## 2018-06-30 MED ORDER — BUPIVACAINE-EPINEPHRINE (PF) 0.5% -1:200000 IJ SOLN
INTRAMUSCULAR | Status: DC | PRN
  Administered 2018-06-30: 20 mL

## 2018-06-30 MED ORDER — BUPIVACAINE IN DEXTROSE 0.75-8.25 % IT SOLN
INTRATHECAL | Status: DC | PRN
  Administered 2018-06-30: 1.8 mL via INTRATHECAL

## 2018-06-30 MED ORDER — FLEET ENEMA 7-19 GM/118ML RE ENEM: 1.0000 | ENEMA | Freq: Once | RECTAL | Status: DC | PRN

## 2018-06-30 MED ORDER — METOCLOPRAMIDE HCL 5 MG PO TABS: 5.0000 mg | ORAL_TABLET | Freq: Three times a day (TID) | ORAL | Status: DC | PRN

## 2018-06-30 MED ORDER — OXYCODONE HCL 5 MG/5ML PO SOLN
5.0000 mg | Freq: Once | ORAL | Status: DC | PRN
  Filled 2018-06-30: qty 5

## 2018-06-30 MED ORDER — MENTHOL 3 MG MT LOZG: 1.0000 | LOZENGE | OROMUCOSAL | Status: DC | PRN

## 2018-06-30 MED ORDER — ONDANSETRON HCL 4 MG PO TABS: 4.0000 mg | ORAL_TABLET | Freq: Four times a day (QID) | ORAL | Status: DC | PRN

## 2018-06-30 MED ORDER — 0.9 % SODIUM CHLORIDE (POUR BTL) OPTIME
TOPICAL | Status: DC | PRN
  Administered 2018-06-30: 1000 mL

## 2018-06-30 MED ORDER — FENTANYL CITRATE (PF) 100 MCG/2ML IJ SOLN: 25.0000 ug | INTRAMUSCULAR | Status: DC | PRN

## 2018-06-30 MED ORDER — METHOCARBAMOL 500 MG PO TABS
500.0000 mg | ORAL_TABLET | Freq: Four times a day (QID) | ORAL | Status: DC | PRN
  Administered 2018-06-30: 500 mg via ORAL
  Filled 2018-06-30: qty 1

## 2018-06-30 MED ORDER — MIDAZOLAM HCL 5 MG/5ML IJ SOLN
INTRAMUSCULAR | Status: DC | PRN
  Administered 2018-06-30 (×2): 1 mg via INTRAVENOUS

## 2018-06-30 MED ORDER — ALUM & MAG HYDROXIDE-SIMETH 200-200-20 MG/5ML PO SUSP: 30.0000 mL | ORAL | Status: DC | PRN

## 2018-06-30 MED ORDER — PROPOFOL 500 MG/50ML IV EMUL
INTRAVENOUS | Status: DC | PRN
  Administered 2018-06-30: 50 ug/kg/min via INTRAVENOUS

## 2018-06-30 MED ORDER — CHLORHEXIDINE GLUCONATE 4 % EX LIQD: 60.0000 mL | Freq: Once | CUTANEOUS | Status: DC

## 2018-06-30 MED ORDER — MIDAZOLAM HCL 2 MG/2ML IJ SOLN
INTRAMUSCULAR | Status: AC
  Filled 2018-06-30: qty 2

## 2018-06-30 MED ORDER — POTASSIUM CHLORIDE CRYS ER 20 MEQ PO TBCR
20.0000 meq | EXTENDED_RELEASE_TABLET | Freq: Every day | ORAL | Status: DC
  Administered 2018-06-30 – 2018-07-03 (×4): 20 meq via ORAL
  Filled 2018-06-30 (×4): qty 1

## 2018-06-30 MED ORDER — LACTATED RINGERS IV SOLN
INTRAVENOUS | Status: DC
  Administered 2018-06-30 (×2): via INTRAVENOUS

## 2018-06-30 MED ORDER — TRANEXAMIC ACID 1000 MG/10ML IV SOLN
1000.0000 mg | INTRAVENOUS | Status: AC
  Administered 2018-06-30: 1000 mg via INTRAVENOUS
  Filled 2018-06-30: qty 10

## 2018-06-30 MED ORDER — PROPOFOL 10 MG/ML IV BOLUS
INTRAVENOUS | Status: AC
  Filled 2018-06-30: qty 60

## 2018-06-30 MED ORDER — ACETAMINOPHEN 325 MG PO TABS: 325.0000 mg | ORAL_TABLET | Freq: Four times a day (QID) | ORAL | Status: DC | PRN

## 2018-06-30 MED ORDER — ACETAMINOPHEN 500 MG PO TABS
1000.0000 mg | ORAL_TABLET | Freq: Four times a day (QID) | ORAL | Status: AC
  Administered 2018-06-30 – 2018-07-01 (×3): 1000 mg via ORAL
  Filled 2018-06-30 (×3): qty 2

## 2018-06-30 MED ORDER — SODIUM CHLORIDE 0.9 % IJ SOLN
INTRAMUSCULAR | Status: AC
  Filled 2018-06-30: qty 50

## 2018-06-30 MED ORDER — LIRAGLUTIDE 18 MG/3ML ~~LOC~~ SOPN: 1.2000 mg | PEN_INJECTOR | Freq: Every morning | SUBCUTANEOUS | Status: DC

## 2018-06-30 MED ORDER — ONDANSETRON HCL 4 MG/2ML IJ SOLN
INTRAMUSCULAR | Status: AC
  Filled 2018-06-30: qty 2

## 2018-06-30 MED ORDER — DIPHENHYDRAMINE HCL 12.5 MG/5ML PO ELIX: 12.5000 mg | ORAL_SOLUTION | ORAL | Status: DC | PRN

## 2018-06-30 SURGICAL SUPPLY — 46 items
BAG DECANTER FOR FLEXI CONT (MISCELLANEOUS) ×3 IMPLANT
BLADE SAW SGTL 18X1.27X75 (BLADE) ×2 IMPLANT
BLADE SAW SGTL 18X1.27X75MM (BLADE) ×1
COVER PERINEAL POST (MISCELLANEOUS) ×3 IMPLANT
COVER SURGICAL LIGHT HANDLE (MISCELLANEOUS) ×3 IMPLANT
DRAPE STERI IOBAN 125X83 (DRAPES) ×3 IMPLANT
DRAPE U-SHAPE 47X51 STRL (DRAPES) ×6 IMPLANT
DRESSING AQUACEL AG SP 3.5X10 (GAUZE/BANDAGES/DRESSINGS) ×1 IMPLANT
DRSG AQUACEL AG ADV 3.5X10 (GAUZE/BANDAGES/DRESSINGS) ×3 IMPLANT
DRSG AQUACEL AG SP 3.5X10 (GAUZE/BANDAGES/DRESSINGS) ×3
DURAPREP 26ML APPLICATOR (WOUND CARE) ×3 IMPLANT
ELECT REM PT RETURN 9FT ADLT (ELECTROSURGICAL) ×3
ELECTRODE REM PT RTRN 9FT ADLT (ELECTROSURGICAL) ×1 IMPLANT
ELIMINATOR HOLE APEX DEPUY (Hips) ×3 IMPLANT
GLOVE BIO SURGEON STRL SZ7.5 (GLOVE) ×3 IMPLANT
GLOVE BIO SURGEON STRL SZ8.5 (GLOVE) ×3 IMPLANT
GLOVE BIOGEL PI IND STRL 8 (GLOVE) ×1 IMPLANT
GLOVE BIOGEL PI IND STRL 9 (GLOVE) ×1 IMPLANT
GLOVE BIOGEL PI INDICATOR 8 (GLOVE) ×2
GLOVE BIOGEL PI INDICATOR 9 (GLOVE) ×2
GOWN STRL REUS W/ TWL LRG LVL3 (GOWN DISPOSABLE) ×1 IMPLANT
GOWN STRL REUS W/ TWL XL LVL3 (GOWN DISPOSABLE) ×1 IMPLANT
GOWN STRL REUS W/TWL LRG LVL3 (GOWN DISPOSABLE) ×2
GOWN STRL REUS W/TWL XL LVL3 (GOWN DISPOSABLE) ×2
HEAD FEMORAL 32 CERAMIC (Hips) ×3 IMPLANT
LINER ACET PNNCL PLUS4 NEUTRAL (Hips) ×1 IMPLANT
MANIFOLD NEPTUNE II (INSTRUMENTS) ×3 IMPLANT
NEEDLE HYPO 22GX1.5 SAFETY (NEEDLE) ×6 IMPLANT
NS IRRIG 1000ML POUR BTL (IV SOLUTION) ×3 IMPLANT
PACK ANTERIOR HIP CUSTOM (KITS) ×3 IMPLANT
PIN SECT CUP 50MM (Hips) ×3 IMPLANT
PINNACLE PLUS 4 NEUTRAL (Hips) ×3 IMPLANT
STEM TRI LOC GRIPTION SZ 9 STD ×1 IMPLANT
SUT ETHIBOND NAB CT1 #1 30IN (SUTURE) ×3 IMPLANT
SUT VIC AB 0 CT1 27 (SUTURE)
SUT VIC AB 0 CT1 27XBRD ANBCTR (SUTURE) IMPLANT
SUT VIC AB 1 CTX 36 (SUTURE) ×2
SUT VIC AB 1 CTX36XBRD ANBCTR (SUTURE) ×1 IMPLANT
SUT VIC AB 2-0 CT1 27 (SUTURE) ×2
SUT VIC AB 2-0 CT1 TAPERPNT 27 (SUTURE) ×1 IMPLANT
SUT VIC AB 3-0 CT1 27 (SUTURE) ×2
SUT VIC AB 3-0 CT1 TAPERPNT 27 (SUTURE) ×1 IMPLANT
SYR CONTROL 10ML LL (SYRINGE) ×9 IMPLANT
TRAY CATH 16FR W/PLASTIC CATH (SET/KITS/TRAYS/PACK) ×3 IMPLANT
TRI LOC GRIPTION SZ 9 STD ×3 IMPLANT
YANKAUER SUCT BULB TIP 10FT TU (MISCELLANEOUS) ×3 IMPLANT

## 2018-06-30 NOTE — Transfer of Care (Signed)
Immediate Anesthesia Transfer of Care Note  Patient: Nicole [name]  Procedure(s) Performed: RIGHT TOTAL HIP ARTHROPLASTY ANTERIOR APPROACH (Right Hip)  Patient Location: PACU  Anesthesia Type:Spinal  Level of Consciousness: awake, alert  and oriented  Airway & Oxygen Therapy: Patient Spontanous Breathing and Patient connected to face mask oxygen  Post-op Assessment: Report given to RN and Post -op Vital signs reviewed and stable  Post vital signs: Reviewed and stable  Last Vitals:  Vitals Value Taken Time  BP 103/66 06/30/2018 12:20 PM  Temp    Pulse 59 06/30/2018 12:21 PM  Resp 17 06/30/2018 12:21 PM  SpO2 100 % 06/30/2018 12:21 PM  Vitals shown include unvalidated device data.  Last Pain:  Vitals:   06/30/18 0749  TempSrc:   PainSc: 8       Patients Stated Pain Goal: 4 (06/30/18 0749)  Complications: No apparent anesthesia complications

## 2018-06-30 NOTE — Anesthesia Preprocedure Evaluation (Signed)
Anesthesia Evaluation  Patient identified by MRN, date of birth, ID band Patient awake    Reviewed: Allergy & Precautions, H&P , NPO status , Patient's Chart, lab work & pertinent test results  Airway Mallampati: II   Neck ROM: full    Dental   Pulmonary Current Smoker,    breath sounds clear to auscultation       Cardiovascular hypertension,  Rhythm:regular Rate:Normal     Neuro/Psych    GI/Hepatic (+) Hepatitis -, C  Endo/Other  diabetes  Renal/GU      Musculoskeletal  (+) Arthritis ,   Abdominal   Peds  Hematology  (+) HIV,   Anesthesia Other Findings   Reproductive/Obstetrics                             Anesthesia Physical Anesthesia Plan  ASA: III  Anesthesia Plan: MAC and Spinal   Post-op Pain Management:    Induction: Intravenous  PONV Risk Score and Plan: 1 and Ondansetron, Propofol infusion and Treatment may vary due to age or medical condition  Airway Management Planned: Simple Face Mask  Additional Equipment:   Intra-op Plan:   Post-operative Plan:   Informed Consent: I have reviewed the patients History and Physical, chart, labs and discussed the procedure including the risks, benefits and alternatives for the proposed anesthesia with the patient or authorized representative who has indicated his/her understanding and acceptance.     Plan Discussed with: CRNA, Anesthesiologist and Surgeon  Anesthesia Plan Comments:         Anesthesia Quick Evaluation

## 2018-06-30 NOTE — Anesthesia Procedure Notes (Signed)
Spinal  Patient location during procedure: OR Start time: 06/30/2018 10:13 AM End time: 06/30/2018 10:15 AM Staffing Anesthesiologist: Achille RichHodierne, Maddilyn Campus, MD Performed: anesthesiologist  Preanesthetic Checklist Completed: patient identified, surgical consent, pre-op evaluation, timeout performed, IV checked, risks and benefits discussed and monitors and equipment checked Spinal Block Patient position: sitting Prep: DuraPrep Patient monitoring: cardiac monitor, continuous pulse ox and blood pressure Approach: midline Location: L3-4 Injection technique: single-shot Needle Needle type: Pencan  Needle gauge: 24 G Needle length: 9 cm Assessment Sensory level: T10 Additional Notes Functioning IV was confirmed and monitors were applied. Sterile prep and drape, including hand hygiene and sterile gloves were used. The patient was positioned and the spine was prepped. The skin was anesthetized with lidocaine.  Free flow of clear CSF was obtained prior to injecting local anesthetic into the CSF.  The spinal needle aspirated freely following injection.  The needle was carefully withdrawn.  The patient tolerated the procedure well.

## 2018-06-30 NOTE — Discharge Instructions (Signed)

## 2018-06-30 NOTE — Progress Notes (Signed)
Care Plan Notes 04/01/2018 to 06/30/2018       Care Plan by Shauna HughAngiulli, Renee at 06/23/2018  4:11 PM    Date of Service   Author Author Type Status Note Type File Time  06/23/2018  Shauna Hughngiulli, Renee  Signed Care Plan 06/23/2018             Spoke with patient prior to surgery. She is known to me from prior surgery. She has no caregiver at home and will requires SNF rehab at discharge. She is pre arranged to go to [facility] after her 3 day qualifying stay.  She has all needed equipment at home    Please contact Shauna HughRenee Angiulli, RNCM, [phone] with questions or if this plan should need to change   Thanks

## 2018-06-30 NOTE — Evaluation (Signed)
Physical Therapy Evaluation Patient Details Name: Nicole GraffCynthia S Tout MRN: [id number] DOB: May 16, 1961 Today's Date: 06/30/2018   History of Present Illness  57 YO female s/p R THA anterior approach on 06/30/18. PMH includes DM II, hypothyroid, obesity, HTN, OA, HIV, Hep C. Surgical history includes L knee arthroscopies, L THA, R TKR.   Clinical Impression  Pt is a 57 YO female s/p R THA anterior approach on 06/30/18. PMH listed above. Pt presents with pre-existing weakness (LUE and LLE weaker than R side), difficulty performing bed mobility tasks, difficulty ambulating, and R hip pain. Pt would benefit from acute PT to address these deficits. Pt ambulated 40 feet with RW with min guard assist from PT. Pt had some unsteadiness with turning, but pt resolved. Pt's plan is to d/c to SNF, will continue to follow acutely.     Follow Up Recommendations SNF;Supervision for mobility/OOB    Equipment Recommendations  None recommended by PT    Recommendations for Other Services       Precautions / Restrictions Precautions Precautions: Fall Restrictions Weight Bearing Restrictions: No Other Position/Activity Restrictions: WBAT       Mobility  Bed Mobility Overal bed mobility: Needs Assistance Bed Mobility: Supine to Sit     Supine to sit: Min assist     General bed mobility comments: Min assist for RLE management, especially scooting RLE to EOB, and scooting trunk to EOB. Verbal cuing for sequencing, increased time to perform.   Transfers Overall transfer level: Needs assistance Equipment used: Rolling walker (2 wheeled) Transfers: Sit to/from Stand Sit to Stand: Mod assist         General transfer comment: Mod assist for trunk elevation, steadying upon standing. Verbal cuing for pt to push hips forward/perform hip extension, hand placement on RW and bed. LUE difficulty with grasping due to prior closed head injury, per RN.   Ambulation/Gait Ambulation/Gait assistance: Min guard Gait  Distance (Feet): 40 Feet Assistive device: Rolling walker (2 wheeled) Gait Pattern/deviations: Step-through pattern;Decreased stride length;Antalgic;Decreased weight shift to right;Trunk flexed Gait velocity: decreased    General Gait Details: Min guard for safety. Pt with flexed trunk for duration of ambulation, pt opened L hand to place on RW x2 due to hand contracture. Difficulty especially with turning. Verbal cuing to step into RW and maintain erect posture.   Stairs            Wheelchair Mobility    Modified Rankin (Stroke Patients Only)       Balance Overall balance assessment: Needs assistance Sitting-balance support: Bilateral upper extremity supported;Feet supported Sitting balance-Leahy Scale: Fair Sitting balance - Comments: used bilat UE support for scooting to EOB, but could tolerate sitting EOB with UE use.    Standing balance support: Bilateral upper extremity supported;During functional activity Standing balance-Leahy Scale: Poor Standing balance comment: relied on RW for UE support                              Pertinent Vitals/Pain Pain Assessment: 0-10 Pain Score: 5  Pain Descriptors / Indicators: Operative site guarding;Sore Pain Intervention(s): Limited activity within patient's tolerance;Ice applied;Monitored during session    Home Living Family/patient expects to be discharged to:: Skilled nursing facility Living Arrangements: Other relatives               Additional Comments: sister lives with her in home     Prior Function Level of Independence: Independent with assistive device(s)  Comments: uses cane for mobility      Hand Dominance   Dominant Hand: Right    Extremity/Trunk Assessment   Upper Extremity Assessment Upper Extremity Assessment: LUE deficits/detail LUE Deficits / Details: LUE weakness and contracted into elbow flexion, wrist flexion, and finger flexion. Movable by PT and pt    Lower  Extremity Assessment Lower Extremity Assessment: Generalized weakness(able to perform quad sets on R x5, ankle pumps x10)    Cervical / Trunk Assessment Cervical / Trunk Assessment: Normal  Communication   Communication: No difficulties  Cognition Arousal/Alertness: Awake/alert Behavior During Therapy: Flat affect Overall Cognitive Status: Within Functional Limits for tasks assessed                                        General Comments      Exercises     Assessment/Plan    PT Assessment    PT Problem List         PT Treatment Interventions      PT Goals (Current goals can be found in the Care Plan section)  Acute Rehab PT Goals PT Goal Formulation: With patient Time For Goal Achievement: 06/30/18 Potential to Achieve Goals: Good    Frequency 7X/week   Barriers to discharge        Co-evaluation               AM-PAC PT "6 Clicks" Daily Activity  Outcome Measure Difficulty turning over in bed (including adjusting bedclothes, sheets and blankets)?: Unable Difficulty moving from lying on back to sitting on the side of the bed? : Unable Difficulty sitting down on and standing up from a chair with arms (e.g., wheelchair, bedside commode, etc,.)?: Unable Help needed moving to and from a bed to chair (including a wheelchair)?: A Lot Help needed walking in hospital room?: A Little Help needed climbing 3-5 steps with a railing? : A Lot 6 Click Score: 10    End of Session Equipment Utilized During Treatment: Gait belt;Oxygen(back on supplemental O2 via [place] ) Activity Tolerance: Patient tolerated treatment well;No increased pain Patient left: in chair;with chair alarm set;with call bell/phone within reach;with family/visitor present;with SCD's reapplied Nurse Communication: Mobility status PT Visit Diagnosis: Other abnormalities of gait and mobility (R26.89);Difficulty in walking, not elsewhere classified (R26.2)    Time: [id number] PT Time  Calculation (min) (ACUTE ONLY): 34 min   Charges:   PT Evaluation $PT Eval Low Complexity: 1 Low PT Treatments $Gait Training: 8-22 mins        Terrion Poblano Terrial Rhodes Theresia Pree, PT, DPT  Pager # [phone]    Riley Hallum D Rayvion Stumph 06/30/2018, 8:04 PM

## 2018-06-30 NOTE — Op Note (Signed)
OPERATIVE REPORT    DATE OF PROCEDURE:  06/30/2018       PREOPERATIVE DIAGNOSIS:  right hip osteoarthitis                                                          POSTOPERATIVE DIAGNOSIS:   right hip osteoarthitis                                                           PROCEDURE: Anterior R total hip arthroplasty using a 50 mm DePuy Pinnacle  Cup, Peabody Energypex Hole Eliminator, 0-degree polyethylene liner, a +1x32 mm ceramic head, a 9 std Depuy Triloc stem   SURGEON: Nestor LewandowskyFrank J Luigi Stuckey    ASSISTANT:   Tomi LikensEric K. [employer] PA-C  (present throughout entire procedure and necessary for timely completion of the procedure)   ANESTHESIA: Spinal BLOOD LOSS: 400cc FLUID REPLACEMENT: 1600 crystalloid Antibiotic: 1.5 gm vancomycin Tranexamic Acid: 1gm IV, 2gm Topical COMPLICATIONS: none    INDICATIONS FOR PROCEDURE: A 57 y.o. year-old With  right hip osteoarthitis   for 5 years, x-rays show bone-on-bone arthritic changes, and osteophytes. Despite conservative measures with observation, anti-inflammatory medicine, narcotics, use of a cane, has severe unremitting pain and can ambulate only a few blocks before resting. Patient desires elective R total hip arthroplasty to decrease pain and increase function. The risks, benefits, and alternatives were discussed at length including but not limited to the risks of infection, bleeding, nerve injury, stiffness, blood clots, the need for revision surgery, cardiopulmonary complications, among others, and they were willing to proceed. Questions answered     PROCEDURE IN DETAIL: The patient was identified by armband,  received preoperative IV antibiotics [facility] holding [facility], taken to the operating room , appropriate anesthetic monitors  were attached and  anesthesia was induced with the patienton the gurney. The HANA boots were applied to the feet and he was then transferred to the HANA table with a peroneal post and support underneath the  non-operative le, which was locked in 5 lb traction. Theoperative lower extremity was then prepped and draped in the usual sterile fashion from just above the iliac crest to the knee. And a timeout procedure was performed. We then made a 15 cm incision along the interval at the leading edge of the tensor fascia lata of starting at 2 cm lateral to and 2 cm distal to the ASIS. Small bleeders in the skin and subcutaneous tissue identified and cauterized we dissected down to the fascia and made an incision in the fascia allowing us to elevate the fascia of the tensor muscle and exploited the interval between the rectus and the tensor fascia lata. A Hohmann retractor was then placed along the superior neck of the femur and a Cobra retractor along the inferior neck of the femur we teed the capsule starting out at the superior anterior aspect of the acetabulum going distally and made the T along the neck both leaflets of the T were tagged with #2 Ethibond suture. Cobra retractors were then placed along the inferior and superior neck allowing us to perform a standard neck  cut and removed the femoral head with a power corkscrew. We then placed a right angle Hohmann retractor along the anterior aspect of the acetabulum a spiked Cobra in the cotyloid notch and posteriorly a Muelller retractor. We then sequentially reamed up to a 49 mm basket reamer obtaining good coverage in all quadrants, verified by C-arm imaging. Under C-arm control with and hammered into place a 50 mm Pinnacle cup in 45 of abduction and 15 of anteversion. The cup seated nicely and required no supplemental screws. We then placed a central hole Eliminator and a 0 polyethylene liner. The foot was then externally rotated to 110, the HANA elevator was placed around the flare of the greater trochanter and the limb was extended and abducted delivering the proximal femur up into the wound. A medium Hohmann retractor was placed over the greater trochanter and a  Mueller retractor along the posterior femoral neck completing the exposure. We then performed releases superiorly and and inferiorly of the capsule going back to the pirformis fossa superiorly and to the lesser trochanter inferiorly. We then entered the proximal femur with the box cutting offset chisel followed by, a canal sounder, the chili pepper and broaching up to a 9std broach. This seated nicely and we reamed the calcar. A trial reduction was performed with a 1.5 mm x32 mm head.The limb lengths were excellent the hip was stable in 90 of external rotation. At this point the trial components removed and we hammered into place a # 9 std  Offset Tri-Lock stem with Gryption coating. A + 1x32 mm ceramic ball was then hammered into place the hip was reduced and final C-arm images obtained. The wound was thoroughly irrigated with normal saline solution. We repaired the ant capsule and the tensor fascia lot a with running 0 vicryl suture. the subcutaneous tissue was closed with 2-0 and 3-0 Vicryl suture followed by an Aquacil dressing. At this point the patient was awaken and transferred to hospital gurney without difficulty. The subcutaneous tissue with 0 and 2-0 undyed Vicryl suture and the skin with running  3-0 vicryl subcuticular suture. Aquacil dressing was applied. The patient was then unclamped, rolled supine, awaken extubated and taken to recovery room without difficulty in stable condition.   Nestor LewandowskyFrank J Alline Pio 06/30/2018, 11:33 AM

## 2018-06-30 NOTE — Anesthesia Procedure Notes (Signed)
Procedure Name: MAC Date/Time: 06/30/2018 10:00 AM Performed by: Maxwell Caul, CRNA Pre-anesthesia Checklist: Patient identified, Emergency Drugs available, Suction available and Patient being monitored Oxygen Delivery Method: Simple face mask

## 2018-06-30 NOTE — Interval H&P Note (Signed)
History and Physical Interval Note:  06/30/2018 9:19 AM  Nicole [name]  has presented today for surgery, with the diagnosis of RIGHT HIP OSTEOARTHRITIS  The various methods of treatment have been discussed with the patient and family. After consideration of risks, benefits and other options for treatment, the patient has consented to  Procedure(s): RIGHT TOTAL HIP ARTHROPLASTY ANTERIOR APPROACH (Right) as a surgical intervention .  The patient's history has been reviewed, patient examined, no change in status, stable for surgery.  I have reviewed the patient's chart and labs.  Questions were answered to the patient's satisfaction.     Nestor LewandowskyFrank J Sevannah Madia

## 2018-07-01 ENCOUNTER — Encounter (HOSPITAL_COMMUNITY): Payer: Self-pay | Admitting: Orthopedic Surgery

## 2018-07-01 LAB — GLUCOSE, CAPILLARY
GLUCOSE-CAPILLARY: 142 mg/dL — AB (ref 70–99)
GLUCOSE-CAPILLARY: 146 mg/dL — AB (ref 70–99)
GLUCOSE-CAPILLARY: 256 mg/dL — AB (ref 70–99)
Glucose-Capillary: 278 mg/dL — ABNORMAL HIGH (ref 70–99)

## 2018-07-01 LAB — BASIC METABOLIC PANEL
ANION GAP: 7 (ref 5–15)
BUN: 12 mg/dL (ref 6–20)
CALCIUM: 8.4 mg/dL — AB (ref 8.9–10.3)
CO2: 24 mmol/L (ref 22–32)
CREATININE: 0.59 mg/dL (ref 0.44–1.00)
Chloride: 109 mmol/L (ref 98–111)
GFR calc Af Amer: >60 mL/min (ref >60)
GLUCOSE: 136 mg/dL — AB (ref 70–99)
Potassium: 3.8 mmol/L (ref 3.5–5.1)
Sodium: 140 mmol/L (ref 135–145)

## 2018-07-01 LAB — CBC
HEMATOCRIT: 33.5 % — AB (ref 36.0–46.0)
Hemoglobin: 11.2 g/dL — ABNORMAL LOW (ref 12.0–15.0)
MCH: 30.3 pg (ref 26.0–34.0)
MCHC: 33.4 g/dL (ref 30.0–36.0)
MCV: 90.5 fL (ref 78.0–100.0)
PLATELETS: 180 10*3/uL (ref 150–400)
RBC: 3.7 MIL/uL — ABNORMAL LOW (ref 3.87–5.11)
RDW: 13.1 % (ref 11.5–15.5)
WBC: 10.5 10*3/uL (ref 4.0–10.5)

## 2018-07-01 NOTE — Progress Notes (Signed)
PATIENT ID: Nicole GraffCynthia S [name]  MRN: [id number]  DOB/AGE:  [birth year] / 59 y.o.  1 Day Post-Op Procedure(s) (LRB): RIGHT TOTAL HIP ARTHROPLASTY ANTERIOR APPROACH (Right)    PROGRESS NOTE Subjective: Patient is alert, oriented, no Nausea, no Vomiting, yes passing gas, . Taking PO well. Denies SOB, Chest or Calf Pain. Using Incentive Spirometer, PAS in place. Ambulate WBAT Patient reports pain as  2/10  .    Objective: Vital signs in last 24 hours: Vitals:   06/30/18 1656 06/30/18 2221 07/01/18 0251 07/01/18 0626  BP: 103/72 97/64 110/68 114/68  Pulse: 67 70 75 70  Resp: 18 17 16 16   Temp: 98.3 F (36.8 C) 97.8 F (36.6 C) 97.8 F (36.6 C) 98.2 F (36.8 C)  TempSrc: Oral Oral Oral Oral  SpO2:  99% 96% 99%  Weight:      Height:          Intake/Output from previous day: I/O last 3 completed shifts: In: 4292.7 [P.O.:640; I.V.:3442.7; IV Piggyback:210] Out: 2050 [Urine:1800; Blood:250]   Intake/Output this shift: No intake/output data recorded.   LABORATORY DATA: Recent Labs    06/30/18 1253 06/30/18 1704 06/30/18 2219 07/01/18 0441  WBC  --   --   --  10.5  HGB  --   --   --  11.2*  HCT  --   --   --  33.5*  PLT  --   --   --  180  NA  --   --   --  140  K  --   --   --  3.8  CL  --   --   --  109  CO2  --   --   --  24  BUN  --   --   --  12  CREATININE  --   --   --  0.59  GLUCOSE  --   --   --  136*  GLUCAP 75 135* 133*  --   CALCIUM  --   --   --  8.4*    Examination: Neurologically intact ABD soft Neurovascular intact Sensation intact distally Intact pulses distally Dorsiflexion/Plantar flexion intact Incision: dressing C/D/I No cellulitis present Compartment soft} XR AP&Lat of hip shows well placed\fixed THA  Assessment:   1 Day Post-Op Procedure(s) (LRB): RIGHT TOTAL HIP ARTHROPLASTY ANTERIOR APPROACH (Right) ADDITIONAL DIAGNOSIS:  Expected Acute Blood Loss Anemia, HIV, Hep B, Tobacco abuse, morbid obesity  Plan: PT/OT WBAT, THA  DVT  Prophylaxis: SCDx72 hrs, ASA 81 mg BID x 2 weeks  DISCHARGE PLAN: Skilled Nursing Facility/Rehab, [facility] days  DISCHARGE NEEDS: HHPT, Walker and 3-in-1 comode seat

## 2018-07-01 NOTE — Care Plan (Signed)
Spoke with Melynda Kellerracy Marshall at [facility] today. They are anticipating patient to be admitted there on Thursday am.   Thanks

## 2018-07-01 NOTE — NC FL2 (Signed)
[place] MEDICAID FL2 LEVEL OF CARE SCREENING TOOL     IDENTIFICATION  Patient Name: Nicole [name] Birthdate: 1961-08-29 Sex: female Admission Date (Current Location): 06/30/2018  [facility] and [place] Number:  Producer, television/film/video and Address:  [facility],  [address] [place], [place] [address]      Provider Number: [id number]  Attending Physician Name and Address:  Gean Birchwood, MD  Relative Name and Phone Number:       Current Level of Care: Hospital Recommended Level of Care: Skilled Nursing Facility Prior Approval Number:    Date Approved/Denied: 03/06/15 PASRR Number: [id number] (MUST ID: [id number])  Discharge Plan: SNF    Current Diagnoses: Patient Active Problem List   Diagnosis Date Noted  . Primary osteoarthritis of right hip 06/30/2018  . Osteoarthritis of right hip 06/20/2018  . Diabetes (HCC) 08/16/2017  . Poor dentition 06/18/2017  . Healthcare maintenance 11/18/2014  . History of hypothyroidism 09/16/2012  . Obesity, Class II, BMI 35-39.9 11/30/2008  . Smokes tobacco daily 02/24/2007  . Essential hypertension, benign 02/24/2007  . Allergic rhinitis, cause unspecified 02/24/2007  . Osteoarthritis 02/24/2007  . Human immunodeficiency virus (HIV) disease (HCC) 11/12/2000  . History of hepatitis C 11/12/2000    Orientation RESPIRATION BLADDER Height & Weight     Self, Time, Situation, Place  Normal Continent Weight: 251 lb 15.8 oz (114.3 kg) Height:  5\' 6"  (167.6 cm)  BEHAVIORAL SYMPTOMS/MOOD NEUROLOGICAL BOWEL NUTRITION STATUS      Continent Diet(carb modified diet)  AMBULATORY STATUS COMMUNICATION OF NEEDS Skin   Limited Assist Verbally Surgical wounds(closed incision right hip, silver hydrofiber dressing)                       Personal Care Assistance Level of Assistance  Bathing, Feeding, Dressing Bathing Assistance: Limited assistance Feeding assistance: Independent Dressing Assistance: Independent     Functional  Limitations Info  Sight, Hearing, Speech Sight Info: Adequate Hearing Info: Adequate Speech Info: Adequate    SPECIAL CARE FACTORS FREQUENCY  PT (By licensed PT), OT (By licensed OT)     PT Frequency: 5x OT Frequency: 5x            Contractures Contractures Info: Not present    Additional Factors Info  Code Status, Allergies Code Status Info: full code Allergies Info: cephalexin           Current Medications (07/01/2018):  This is the current hospital active medication list Current Facility-Administered Medications  Medication Dose Route Frequency Provider Last Rate Last Dose  . acetaminophen (TYLENOL) tablet 1,000 mg  1,000 mg Oral Q6H Allena Katz, PA-C   1,000 mg at 07/01/18 6578  . acetaminophen (TYLENOL) tablet 325-650 mg  325-650 mg Oral Q6H PRN Allena Katz, PA-C      . alum & mag hydroxide-simeth (MAALOX/MYLANTA) 200-200-20 MG/5ML suspension 30 mL  30 mL Oral Q4H PRN Allena Katz, PA-C      . aspirin chewable tablet 81 mg  81 mg Oral BID Allena Katz, PA-C   81 mg at 07/01/18 0851  . bisacodyl (DULCOLAX) EC tablet 5 mg  5 mg Oral Daily PRN Allena Katz, PA-C      . celecoxib (CELEBREX) capsule 200 mg  200 mg Oral BID Allena Katz, PA-C   200 mg at 07/01/18 0849  . dextrose 5 % and 0.45 % NaCl with KCl 20 mEq/L infusion   Intravenous Continuous Dannielle Burn K, PA-C 125 mL/hr at  07/01/18 0600    . diphenhydrAMINE (BENADRYL) 12.5 MG/5ML elixir 12.5-25 mg  12.5-25 mg Oral Q4H PRN Allena KatzPhillips, Eric K, PA-C      . docusate sodium (COLACE) capsule 100 mg  100 mg Oral BID Allena Katzhillips, Eric K, PA-C   100 mg at 07/01/18 0849  . elvitegravir-cobicistat-emtricitabine-tenofovir (GENVOYA) 150-150-200-10 MG tablet 1 tablet  1 tablet Oral Q breakfast Allena Katzhillips, Eric K, PA-C   1 tablet at 07/01/18 [id number]  . fluticasone (FLONASE) 50 MCG/ACT nasal spray 1 spray  1 spray Each Nare Daily PRN Allena KatzPhillips, Eric K, PA-C      . gabapentin (NEURONTIN) capsule 300 mg  300 mg Oral  TID Allena KatzPhillips, Eric K, PA-C   300 mg at 07/01/18 0851  . lisinopril (PRINIVIL,ZESTRIL) tablet 20 mg  20 mg Oral Daily Allena Katzhillips, Eric K, PA-C   20 mg at 07/01/18 [id number]   And  . hydrochlorothiazide (MICROZIDE) capsule 12.5 mg  12.5 mg Oral Daily Allena Katzhillips, Eric K, PA-C   12.5 mg at 07/01/18 0850  . HYDROmorphone (DILAUDID) injection 0.5-1 mg  0.5-1 mg Intravenous Q4H PRN Allena Katzhillips, Eric K, PA-C   1 mg at 06/30/18 1532  . insulin aspart (novoLOG) injection 0-15 Units  0-15 Units Subcutaneous TID WC Allena KatzPhillips, Eric K, PA-C   2 Units at 07/01/18 0848  . insulin detemir (LEVEMIR) injection 20 Units  20 Units Subcutaneous Q2200 Allena Katzhillips, Eric K, PA-C   20 Units at 06/30/18 2224  . liraglutide (VICTOZA) SOPN 1.2 mg  1.2 mg Subcutaneous q morning - 10a Allena KatzPhillips, Eric K, PA-C      . menthol-cetylpyridinium (CEPACOL) lozenge 3 mg  1 lozenge Oral PRN Allena KatzPhillips, Eric K, PA-C       Or  . phenol (CHLORASEPTIC) mouth spray 1 spray  1 spray Mouth/Throat PRN Allena KatzPhillips, Eric K, PA-C      . methocarbamol (ROBAXIN) tablet 500 mg  500 mg Oral Q6H PRN Allena KatzPhillips, Eric K, PA-C   500 mg at 06/30/18 2103   Or  . methocarbamol (ROBAXIN) 500 mg in dextrose 5 % 50 mL IVPB  500 mg Intravenous Q6H PRN Allena KatzPhillips, Eric K, PA-C   Stopped at 06/30/18 1539  . metoCLOPramide (REGLAN) tablet 5-10 mg  5-10 mg Oral Q8H PRN Allena KatzPhillips, Eric K, PA-C       Or  . metoCLOPramide (REGLAN) injection 5-10 mg  5-10 mg Intravenous Q8H PRN Allena KatzPhillips, Eric K, PA-C      . neomycin-polymyxin b-dexamethasone (MAXITROL) ophthalmic ointment 1 application  1 application Both Eyes QHS Allena Katzhillips, Eric K, PA-C   1 application at 06/30/18 2106  . ondansetron (ZOFRAN) tablet 4 mg  4 mg Oral Q6H PRN Allena KatzPhillips, Eric K, PA-C       Or  . ondansetron [facility](ZOFRAN) injection 4 mg  4 mg Intravenous Q6H PRN Allena KatzPhillips, Eric K, PA-C      . oxyCODONE (Oxy IR/ROXICODONE) immediate release tablet 5-10 mg  5-10 mg Oral Q4H PRN Allena KatzPhillips, Eric K, PA-C   10 mg at 06/30/18 2104  . pantoprazole  (PROTONIX) EC tablet 40 mg  40 mg Oral Daily Allena Katzhillips, Eric K, PA-C   40 mg at 07/01/18 0850  . polyethylene glycol (MIRALAX / GLYCOLAX) packet 17 g  17 g Oral Daily PRN Allena KatzPhillips, Eric K, PA-C      . potassium chloride SA (K-DUR,KLOR-CON) CR tablet 20 mEq  20 mEq Oral Daily Allena Katzhillips, Eric K, PA-C   20 mEq at 07/01/18 0850  . sodium phosphate (FLEET) 7-19 GM/118ML enema 1 enema  1 enema Rectal Once PRN Allena KatzPhillips, Eric K, PA-C         Discharge Medications: Please see discharge summary for a list of discharge medications.  Relevant Imaging Results:  Relevant Lab Results:   Additional Information SS [id number]  Nelwyn SalisburyMeghan R Capucine Tryon, LCSW

## 2018-07-01 NOTE — Progress Notes (Signed)
Physical Therapy Treatment Patient Details Name: Nicole [name] MRN: [id number] DOB: 07-May-1961 Today's Date: 07/01/2018    History of Present Illness 57 YO female s/p R THA anterior approach on 06/30/18. PMH includes DM II, hypothyroid, obesity, HTN, OA, HIV, Hep C. Surgical history includes L knee arthroscopies, L THA, R TKR.     PT Comments    POD # 1 pm session Assisted with amb a greater distance.  Positioned in recliner with ICE.   Follow Up Recommendations  SNF;Supervision for mobility/OOB     Equipment Recommendations  None recommended by PT    Recommendations for Other Services       Precautions / Restrictions Precautions Precautions: Fall Restrictions Weight Bearing Restrictions: No Other Position/Activity Restrictions: WBAT     Mobility  Bed Mobility               General bed mobility comments: Pt OOB in recliner  Transfers Overall transfer level: Needs assistance Equipment used: Rolling walker (2 wheeled) Transfers: Sit to/from Stand Sit to Stand: Min assist;Min guard         General transfer comment: 25% VC's on proper tech and safety with turns  Ambulation/Gait Ambulation/Gait assistance: Min guard Gait Distance (Feet): 85 Feet Assistive device: Rolling walker (2 wheeled) Gait Pattern/deviations: Step-through pattern;Decreased stride length;Antalgic;Decreased weight shift to right;Trunk flexed Gait velocity: decreased    General Gait Details: 25% VC's on proper walker to self distance and upright posture   Stairs             Wheelchair Mobility    Modified Rankin (Stroke Patients Only)       Balance                                            Cognition Arousal/Alertness: Awake/alert Behavior During Therapy: WFL for tasks assessed/performed Overall Cognitive Status: Within Functional Limits for tasks assessed                                 General Comments: pleasant and motivated       Exercises      General Comments        Pertinent Vitals/Pain Pain Score: 3  Pain Location: R hip Pain Descriptors / Indicators: Operative site guarding;Sore Pain Intervention(s): Monitored during session;Repositioned;Ice applied    Home Living                      Prior Function            PT Goals (current goals can now be found in the care plan section) Progress towards PT goals: Progressing toward goals    Frequency    7X/week      PT Plan Current plan remains appropriate    Co-evaluation              AM-PAC PT "6 Clicks" Daily Activity  Outcome Measure  Difficulty turning over in bed (including adjusting bedclothes, sheets and blankets)?: A Lot Difficulty moving from lying on back to sitting on the side of the bed? : A Lot Difficulty sitting down on and standing up from a chair with arms (e.g., wheelchair, bedside commode, etc,.)?: A Lot Help needed moving to and from a bed to chair (including a wheelchair)?: A Lot Help needed walking in hospital room?: A  Lot Help needed climbing 3-5 steps with a railing? : Total 6 Click Score: 11    End of Session Equipment Utilized During Treatment: Gait belt Activity Tolerance: Patient tolerated treatment well;No increased pain Patient left: in chair;with chair alarm set;with call bell/phone within reach;with family/visitor present;with SCD's reapplied Nurse Communication: Mobility status PT Visit Diagnosis: Other abnormalities of gait and mobility (R26.89);Difficulty in walking, not elsewhere classified (R26.2)     Time: 1430-1446 PT Time Calculation (min) (ACUTE ONLY): 16 min  Charges:  $Gait Training: 8-22 mins $Therapeutic Exercise: 8-22 mins                     Felecia ShellingLori Trejuan Matherne  PTA [facility]  Acute  Rehab Pager      [phone]

## 2018-07-01 NOTE — Anesthesia Postprocedure Evaluation (Signed)
Anesthesia Post Note  Patient: Nicole [name]  Procedure(s) Performed: RIGHT TOTAL HIP ARTHROPLASTY ANTERIOR APPROACH (Right Hip)     Patient location during evaluation: PACU Anesthesia Type: MAC Level of consciousness: oriented and awake and alert Pain management: pain level controlled Vital Signs Assessment: post-procedure vital signs reviewed and stable Respiratory status: spontaneous breathing, respiratory function stable and patient connected to nasal cannula oxygen Cardiovascular status: blood pressure returned to baseline and stable Postop Assessment: no headache, no backache and no apparent nausea or vomiting Anesthetic complications: no    Last Vitals:  Vitals:   07/01/18 0626 07/01/18 0933  BP: 114/68 (!) 144/76  Pulse: 70 87  Resp: 16 16  Temp: 36.8 C 36.8 C  SpO2: 99% 100%    Last Pain:  Vitals:   07/01/18 0933  TempSrc: Oral  PainSc:                  Lawrence S

## 2018-07-01 NOTE — Progress Notes (Signed)
Physical Therapy Treatment Patient Details Name: Nicole GraffCynthia S Donatelli MRN: [id number] DOB: 1961-01-04 Today's Date: 07/01/2018    History of Present Illness 57 YO female s/p R THA anterior approach on 06/30/18. PMH includes DM II, hypothyroid, obesity, HTN, OA, HIV, Hep C. Surgical history includes L knee arthroscopies, L THA, R TKR.     PT Comments    POD # 1 am session.  Assisted out of recliner to amb a greater distance then returned to room to perform  Some THR TE's followed by ICE.   Follow Up Recommendations  SNF;Supervision for mobility/OOB     Equipment Recommendations  None recommended by PT    Recommendations for Other Services       Precautions / Restrictions Precautions Precautions: Fall Restrictions Weight Bearing Restrictions: No Other Position/Activity Restrictions: WBAT     Mobility  Bed Mobility               General bed mobility comments: Pt OOB in recliner  Transfers Overall transfer level: Needs assistance Equipment used: Rolling walker (2 wheeled) Transfers: Sit to/from Stand Sit to Stand: Min assist;Min guard         General transfer comment: 25% VC's on proper tech and safety with turns  Ambulation/Gait Ambulation/Gait assistance: Min guard Gait Distance (Feet): 55 Feet Assistive device: Rolling walker (2 wheeled) Gait Pattern/deviations: Step-through pattern;Decreased stride length;Antalgic;Decreased weight shift to right;Trunk flexed Gait velocity: decreased    General Gait Details: 25% VC's on proper walker to self distance and upright posture   Stairs             Wheelchair Mobility    Modified Rankin (Stroke Patients Only)       Balance                                            Cognition Arousal/Alertness: Awake/alert Behavior During Therapy: WFL for tasks assessed/performed Overall Cognitive Status: Within Functional Limits for tasks assessed                                  General Comments: pleasant and motivated      Exercises   Total Hip Replacement TE's 10 reps ankle pumps 10 reps knee presses 10 reps heel slides 10 reps SAQ's 10 reps ABD Followed by ICE     General Comments        Pertinent Vitals/Pain Pain Score: 3  Pain Location: R hip Pain Descriptors / Indicators: Operative site guarding;Sore Pain Intervention(s): Monitored during session;Repositioned;Ice applied    Home Living                      Prior Function            PT Goals (current goals can now be found in the care plan section) Progress towards PT goals: Progressing toward goals    Frequency    7X/week      PT Plan Current plan remains appropriate    Co-evaluation              AM-PAC PT "6 Clicks" Daily Activity  Outcome Measure  Difficulty turning over in bed (including adjusting bedclothes, sheets and blankets)?: A Lot Difficulty moving from lying on back to sitting on the side of the bed? : A Lot Difficulty sitting down on  and standing up from a chair with arms (e.g., wheelchair, bedside commode, etc,.)?: A Lot Help needed moving to and from a bed to chair (including a wheelchair)?: A Lot Help needed walking in hospital room?: A Lot Help needed climbing 3-5 steps with a railing? : Total 6 Click Score: 11    End of Session Equipment Utilized During Treatment: Gait belt Activity Tolerance: Patient tolerated treatment well;No increased pain Patient left: in chair;with chair alarm set;with call bell/phone within reach;with family/visitor present;with SCD's reapplied Nurse Communication: Mobility status PT Visit Diagnosis: Other abnormalities of gait and mobility (R26.89);Difficulty in walking, not elsewhere classified (R26.2)     Time: [id number] PT Time Calculation (min) (ACUTE ONLY): 26 min  Charges:  $Gait Training: 8-22 mins $Therapeutic Exercise: 8-22 mins                     Felecia ShellingLori Isabella Ida  PTA [facility]  Acute  Rehab Pager       [phone]

## 2018-07-01 NOTE — Clinical Social Work Note (Signed)
Clinical Social Work Assessment  Patient Details  Name: Nicole [name] MRN: [id number] Date of Birth: Jun 14, 1961  Date of referral:  07/01/18               Reason for consult:  Discharge Planning                Permission sought to share information with:  Case Manager Permission granted to share information::  Yes, Verbal Permission Granted  Name::        Agency::  SNF  Relationship::  Sister  Contact Information:     Housing/Transportation Living arrangements for the past 2 months:  Apartment Source of Information:  Patient Patient Interpreter Needed:  None Criminal Activity/Legal Involvement Pertinent to Current Situation/Hospitalization:  No - Comment as needed Significant Relationships:  Siblings Lives with:  Self Do you feel safe going back to the place where you live?  Yes Need for family participation in patient care:  Yes (Comment)  Care giving concerns:   Patient has right osteoarthritis/ right hip arthroplasty anterior approach.   SNF placement for short rehab.   Social Worker assessment / plan:  CSW met with the patient at bedside, explained role and reason for visit. Patient is prearranged to go to [facility] for rehab. Patient reports after rehab her sister plans to stay with her for a few weeks while she transitions home.  CSW confirmed with admission coordinator the facility will have rehab bed for the patient at discharge.  Patient will transport by PTAR.  FL2 and PASRR complete.   Plan: SNF   Employment status:  Disabled (Comment on whether or not currently receiving Disability) Insurance information:  Medicare PT Recommendations:  [employer] / Referral to community resources:  [place]  Patient/Family's Response to care:  Agreeable and Responding well to care.   Patient/Family's Understanding of and Emotional Response to Diagnosis, Current Treatment, and Prognosis: Patient has a good understanding of her  diagnosis and follow up plan at SNF.   Emotional Assessment Appearance:  Appears stated age Attitude/Demeanor/Rapport:    Affect (typically observed):  Accepting Orientation:  Oriented to Self, Oriented to Place, Oriented to  Time, Oriented to Situation Alcohol / Substance use:    Psych involvement (Current and /or in the community):  No (Comment)  Discharge Needs  Concerns to be addressed:  Discharge Planning Concerns Readmission within the last 30 days:  No Current discharge risk:  Dependent with Mobility Barriers to Discharge:  Continued Medical Work up   [employer], LCSW 07/01/2018, 2:22 PM

## 2018-07-01 NOTE — Care Management Note (Signed)
Case Management Note  Patient Details  Name: Bea GraffCynthia S Hiscox MRN: [id number] Date of Birth: 01/11/1961  Subjective/Objective:                    Action/Plan: Plan for d/c to SNF, discharge planning per CSW. [phone]  Expected Discharge Date:  07/02/18               Expected Discharge Plan:  Skilled Nursing Facility  In-House Referral:  Clinical Social Work  Discharge planning Services  CM Consult  Post Acute Care Choice:  NA Choice offered to:  Patient  DME Arranged:  N/A DME Agency:  NA  HH Arranged:  NA HH Agency:  NA  Status of Service:  Completed, signed off  If discussed at [employer] Meetings, dates discussed:    Additional Comments:  Alexis Goodelleele, Monaye Blackie K, RN 07/01/2018, 12:21 PM

## 2018-07-02 ENCOUNTER — Other Ambulatory Visit: Payer: Self-pay | Admitting: Internal Medicine

## 2018-07-02 LAB — CBC
HCT: 34.6 % — ABNORMAL LOW (ref 36.0–46.0)
Hemoglobin: 11.8 g/dL — ABNORMAL LOW (ref 12.0–15.0)
MCH: 30.5 pg (ref 26.0–34.0)
MCHC: 34.1 g/dL (ref 30.0–36.0)
MCV: 89.4 fL (ref 78.0–100.0)
Platelets: 194 10*3/uL (ref 150–400)
RBC: 3.87 MIL/uL (ref 3.87–5.11)
RDW: 13.1 % (ref 11.5–15.5)
WBC: 21.4 10*3/uL — ABNORMAL HIGH (ref 4.0–10.5)

## 2018-07-02 LAB — GLUCOSE, CAPILLARY
GLUCOSE-CAPILLARY: 210 mg/dL — AB (ref 70–99)
Glucose-Capillary: 218 mg/dL — ABNORMAL HIGH (ref 70–99)
Glucose-Capillary: 228 mg/dL — ABNORMAL HIGH (ref 70–99)

## 2018-07-02 NOTE — Progress Notes (Signed)
Physical Therapy Treatment Patient Details Name: Nicole GraffCynthia S Heyliger MRN: [id number] DOB: September 10, 1961 Today's Date: 07/02/2018    History of Present Illness 57 YO female s/p R THA anterior approach on 06/30/18. PMH includes DM II, hypothyroid, obesity, HTN, OA, HIV, Hep C. Surgical history includes L knee arthroscopies, L THA, R TKR.     PT Comments    POD # 2 pm session Pt c/o increased fatigue.  Assisted with amb in hallway and to bathroom.  Pt progressing slowly.     Follow Up Recommendations  SNF;Supervision for mobility/OOB     Equipment Recommendations  None recommended by PT    Recommendations for Other Services       Precautions / Restrictions Precautions Precautions: Fall Restrictions Weight Bearing Restrictions: No Other Position/Activity Restrictions: WBAT     Mobility  Bed Mobility               General bed mobility comments: Pt OOB in recliner  Transfers Overall transfer level: Needs assistance Equipment used: Rolling walker (2 wheeled) Transfers: Sit to/from Stand Sit to Stand: Min assist;Min guard         General transfer comment: 25% VC's on proper tech and safety with turns  Ambulation/Gait Ambulation/Gait assistance: Min guard Gait Distance (Feet): 115 Feet Assistive device: Rolling walker (2 wheeled) Gait Pattern/deviations: Step-through pattern;Decreased stride length;Antalgic;Decreased weight shift to right;Trunk flexed Gait velocity: decreased    General Gait Details: 25% VC's on proper walker to self distance and upright posture   Stairs             Wheelchair Mobility    Modified Rankin (Stroke Patients Only)       Balance                                            Cognition Arousal/Alertness: Awake/alert Behavior During Therapy: WFL for tasks assessed/performed Overall Cognitive Status: Within Functional Limits for tasks assessed                                 General Comments:  pleasant and motivated      Exercises      General Comments        Pertinent Vitals/Pain Pain Assessment: 0-10 Pain Score: 4  Pain Location: R hip Pain Descriptors / Indicators: Operative site guarding;Sore;Tender;Tightness Pain Intervention(s): Monitored during session;Repositioned;Ice applied;Premedicated before session    Home Living                      Prior Function            PT Goals (current goals can now be found in the care plan section) Progress towards PT goals: Progressing toward goals    Frequency    7X/week      PT Plan Current plan remains appropriate    Co-evaluation              AM-PAC PT "6 Clicks" Daily Activity  Outcome Measure  Difficulty turning over in bed (including adjusting bedclothes, sheets and blankets)?: A Lot Difficulty moving from lying on back to sitting on the side of the bed? : A Lot Difficulty sitting down on and standing up from a chair with arms (e.g., wheelchair, bedside commode, etc,.)?: A Lot Help needed moving to and from a bed to chair (  including a wheelchair)?: A Lot Help needed walking in hospital room?: A Lot Help needed climbing 3-5 steps with a railing? : Total 6 Click Score: 11    End of Session Equipment Utilized During Treatment: Gait belt Activity Tolerance: Patient tolerated treatment well Patient left: in chair;with call bell/phone within reach Nurse Communication: Mobility status PT Visit Diagnosis: Other abnormalities of gait and mobility (R26.89);Difficulty in walking, not elsewhere classified (R26.2)     Time: [id number] PT Time Calculation (min) (ACUTE ONLY): 25 min  Charges:  $Gait Training: 8-22 mins $Therapeutic Activity: 8-22 mins                     Felecia ShellingLori Cyler Kappes  PTA [facility]  Acute  Rehab Pager      [phone]

## 2018-07-02 NOTE — Progress Notes (Signed)
PATIENT ID: Nicole [name]  MRN: [id number]  DOB/AGE:  [birth year] / 59 y.o.  2 Days Post-Op Procedure(s) (LRB): RIGHT TOTAL HIP ARTHROPLASTY ANTERIOR APPROACH (Right)    PROGRESS NOTE Subjective: Patient is alert, oriented, no Nausea, no Vomiting, yes passing gas, . Taking PO well. Denies SOB, Chest or Calf Pain. Using Incentive Spirometer, PAS in place. Ambulate WBAT with pt walking 85 ft with therapy Patient reports pain as  mild .    Objective: Vital signs in last 24 hours: Vitals:   07/01/18 0933 07/01/18 1328 07/01/18 2039 07/02/18 0513  BP: (!) 144/76 114/85 119/74 94/75  Pulse: 87 78 84 71  Resp: 16 14 16 20   Temp: 98.2 F (36.8 C) 97.8 F (36.6 C) 97.6 F (36.4 C) 97.8 F (36.6 C)  TempSrc: Oral Oral Oral Oral  SpO2: 100% 96% 98% 99%  Weight:      Height:          Intake/Output from previous day: I/O last 3 completed shifts: In: 5258.5 [P.O.:1360; I.V.:3688.5; IV Piggyback:210] Out: 4100 [Urine:3850; Blood:250]   Intake/Output this shift: Total I/O In: 240 [P.O.:240] Out: 400 [Urine:400]   LABORATORY DATA: Recent Labs    07/01/18 0441  07/01/18 1218 07/01/18 1656 07/01/18 2059 07/02/18 0517  WBC 10.5  --   --   --   --  21.4*  HGB 11.2*  --   --   --   --  11.8*  HCT 33.5*  --   --   --   --  34.6*  PLT 180  --   --   --   --  194  NA 140  --   --   --   --   --   K 3.8  --   --   --   --   --   CL 109  --   --   --   --   --   CO2 24  --   --   --   --   --   BUN 12  --   --   --   --   --   CREATININE 0.59  --   --   --   --   --   GLUCOSE 136*  --   --   --   --   --   GLUCAP  --    < > 146* 256* 278*  --   CALCIUM 8.4*  --   --   --   --   --    < > = values in this interval not displayed.    Examination: Neurologically intact Neurovascular intact Sensation intact distally Intact pulses distally Dorsiflexion/Plantar flexion intact Incision: dressing C/D/I and scant drainage No cellulitis present Compartment soft} XR AP&Lat of hip shows  well placed\fixed THA  Assessment:   2 Days Post-Op Procedure(s) (LRB): RIGHT TOTAL HIP ARTHROPLASTY ANTERIOR APPROACH (Right) ADDITIONAL DIAGNOSIS:  Expected Acute Blood Loss Anemia,  HIV, Hep B, Tobacco abuse, morbid obesity  Plan: PT/OT WBAT, THA  DVT Prophylaxis: SCDx72 hrs, ASA 81 mg BID x 2 weeks  DISCHARGE PLAN: Skilled Nursing Facility/Rehab  DISCHARGE NEEDS: HHPT, Walker and 3-in-1 comode seat

## 2018-07-02 NOTE — Progress Notes (Signed)
Physical Therapy Treatment Patient Details Name: Nicole [name] MRN: [id number] DOB: 1961/05/23 Today's Date: 07/02/2018    History of Present Illness 57 YO female s/p R THA anterior approach on 06/30/18. PMH includes DM II, hypothyroid, obesity, HTN, OA, HIV, Hep C. Surgical history includes L knee arthroscopies, L THA, R TKR.     PT Comments    POD # 2 am session Assisted with amb a greater distance.  Unsteady gait.  Returned to room then performed THR TE's mostly AAROM due to wekaness/pain.  Pt progressing slowly.  Follow Up Recommendations  SNF;Supervision for mobility/OOB     Equipment Recommendations  None recommended by PT    Recommendations for Other Services       Precautions / Restrictions Precautions Precautions: Fall Restrictions Weight Bearing Restrictions: No Other Position/Activity Restrictions: WBAT     Mobility  Bed Mobility               General bed mobility comments: Pt OOB in recliner  Transfers Overall transfer level: Needs assistance Equipment used: Rolling walker (2 wheeled) Transfers: Sit to/from Stand Sit to Stand: Min assist;Min guard         General transfer comment: 25% VC's on proper tech and safety with turns  Ambulation/Gait Ambulation/Gait assistance: Min guard Gait Distance (Feet): 85 Feet Assistive device: Rolling walker (2 wheeled) Gait Pattern/deviations: Step-through pattern;Decreased stride length;Antalgic;Decreased weight shift to right;Trunk flexed Gait velocity: decreased    General Gait Details: 25% VC's on proper walker to self distance and upright posture   Stairs             Wheelchair Mobility    Modified Rankin (Stroke Patients Only)       Balance                                            Cognition Arousal/Alertness: Awake/alert Behavior During Therapy: WFL for tasks assessed/performed Overall Cognitive Status: Within Functional Limits for tasks assessed                                  General Comments: pleasant and motivated      Exercises   Total Hip Replacement TE's 10 reps ankle pumps 10 reps knee presses 10 reps heel slides 10 reps SAQ's 10 reps ABD Followed by ICE     General Comments        Pertinent Vitals/Pain Pain Assessment: 0-10 Pain Score: 4  Pain Location: R hip Pain Descriptors / Indicators: Operative site guarding;Sore;Tender;Tightness Pain Intervention(s): Monitored during session;Repositioned;Ice applied;Premedicated before session    Home Living                      Prior Function            PT Goals (current goals can now be found in the care plan section) Progress towards PT goals: Progressing toward goals    Frequency    7X/week      PT Plan Current plan remains appropriate    Co-evaluation              AM-PAC PT "6 Clicks" Daily Activity  Outcome Measure  Difficulty turning over in bed (including adjusting bedclothes, sheets and blankets)?: A Lot Difficulty moving from lying on back to sitting on the side of the bed? :  A Lot Difficulty sitting down on and standing up from a chair with arms (e.g., wheelchair, bedside commode, etc,.)?: A Lot Help needed moving to and from a bed to chair (including a wheelchair)?: A Lot Help needed walking in hospital room?: A Lot Help needed climbing 3-5 steps with a railing? : Total 6 Click Score: 11    End of Session Equipment Utilized During Treatment: Gait belt Activity Tolerance: Patient tolerated treatment well Patient left: in chair;with call bell/phone within reach Nurse Communication: Mobility status PT Visit Diagnosis: Other abnormalities of gait and mobility (R26.89);Difficulty in walking, not elsewhere classified (R26.2)     Time: 1010-1035 PT Time Calculation (min) (ACUTE ONLY): 25 min  Charges:  $Gait Training: 8-22 mins $Therapeutic Exercise: 8-22 mins                     Felecia ShellingLori Cyndal Kasson  PTA [facility]  Acute   Rehab Pager      [phone]

## 2018-07-03 DIAGNOSIS — I10 Essential (primary) hypertension: Secondary | ICD-10-CM | POA: Diagnosis not present

## 2018-07-03 DIAGNOSIS — M25551 Pain in right hip: Secondary | ICD-10-CM | POA: Diagnosis not present

## 2018-07-03 DIAGNOSIS — R279 Unspecified lack of coordination: Secondary | ICD-10-CM | POA: Diagnosis not present

## 2018-07-03 DIAGNOSIS — Z471 Aftercare following joint replacement surgery: Secondary | ICD-10-CM | POA: Diagnosis not present

## 2018-07-03 DIAGNOSIS — E119 Type 2 diabetes mellitus without complications: Secondary | ICD-10-CM | POA: Diagnosis not present

## 2018-07-03 DIAGNOSIS — R2681 Unsteadiness on feet: Secondary | ICD-10-CM | POA: Diagnosis not present

## 2018-07-03 DIAGNOSIS — Z743 Need for continuous supervision: Secondary | ICD-10-CM | POA: Diagnosis not present

## 2018-07-03 DIAGNOSIS — B20 Human immunodeficiency virus [HIV] disease: Secondary | ICD-10-CM | POA: Diagnosis not present

## 2018-07-03 DIAGNOSIS — Z794 Long term (current) use of insulin: Secondary | ICD-10-CM | POA: Diagnosis not present

## 2018-07-03 DIAGNOSIS — G8194 Hemiplegia, unspecified affecting left nondominant side: Secondary | ICD-10-CM | POA: Diagnosis not present

## 2018-07-03 DIAGNOSIS — D72829 Elevated white blood cell count, unspecified: Secondary | ICD-10-CM | POA: Diagnosis not present

## 2018-07-03 DIAGNOSIS — M1611 Unilateral primary osteoarthritis, right hip: Secondary | ICD-10-CM | POA: Diagnosis not present

## 2018-07-03 DIAGNOSIS — Z21 Asymptomatic human immunodeficiency virus [HIV] infection status: Secondary | ICD-10-CM | POA: Diagnosis not present

## 2018-07-03 DIAGNOSIS — Z96641 Presence of right artificial hip joint: Secondary | ICD-10-CM | POA: Diagnosis not present

## 2018-07-03 DIAGNOSIS — M6281 Muscle weakness (generalized): Secondary | ICD-10-CM | POA: Diagnosis not present

## 2018-07-03 LAB — CBC
HCT: 32.1 % — ABNORMAL LOW (ref 36.0–46.0)
HEMOGLOBIN: 11 g/dL — AB (ref 12.0–15.0)
MCH: 30.6 pg (ref 26.0–34.0)
MCHC: 34.3 g/dL (ref 30.0–36.0)
MCV: 89.4 fL (ref 78.0–100.0)
Platelets: 209 10*3/uL (ref 150–400)
RBC: 3.59 MIL/uL — ABNORMAL LOW (ref 3.87–5.11)
RDW: 13.2 % (ref 11.5–15.5)
WBC: 23.1 10*3/uL — ABNORMAL HIGH (ref 4.0–10.5)

## 2018-07-03 LAB — GLUCOSE, CAPILLARY
GLUCOSE-CAPILLARY: 139 mg/dL — AB (ref 70–99)
GLUCOSE-CAPILLARY: 177 mg/dL — AB (ref 70–99)

## 2018-07-03 NOTE — Care Management Important Message (Signed)
Important Message  Patient Details  Name: Nicole [name] MRN: [id number] Date of Birth: 02-Oct-1961   Medicare Important Message Given:  Yes    Caren MacadamFuller, Ruston Fedora 07/03/2018, 12:44 PMImportant Message  Patient Details  Name: Nicole [name] MRN: [id number] Date of Birth: 02-Oct-1961   Medicare Important Message Given:  Yes    Caren MacadamFuller, Sophie Tamez 07/03/2018, 12:44 PM

## 2018-07-03 NOTE — Progress Notes (Signed)
Physical Therapy Treatment Patient Details Name: NICHOLAS OSSA MRN: [id number] DOB: [birth year] Today's Date: 07/03/2018    History of Present Illness 57 YO female s/p R THA anterior approach on 06/30/18. PMH includes DM II, hypothyroid, obesity, HTN, OA, HIV, Hep C. Surgical history includes L knee arthroscopies, L THA, R TKR.     PT Comments    POD # 3 am session Assisted with amb in hallway and performed THR TE's followed by ICE.     Follow Up Recommendations  SNF;Supervision for mobility/OOB     Equipment Recommendations  None recommended by PT    Recommendations for Other Services       Precautions / Restrictions Precautions Precautions: Fall Restrictions Weight Bearing Restrictions: No Other Position/Activity Restrictions: WBAT     Mobility  Bed Mobility               General bed mobility comments: Pt OOB in recliner  Transfers Overall transfer level: Needs assistance Equipment used: Rolling walker (2 wheeled) Transfers: Sit to/from Stand Sit to Stand: Min assist;Min guard         General transfer comment: 25% VC's on proper tech and safety with turns  Ambulation/Gait Ambulation/Gait assistance: Min guard Gait Distance (Feet): 125 Feet Assistive device: Rolling walker (2 wheeled) Gait Pattern/deviations: Step-through pattern;Decreased stride length;Antalgic;Decreased weight shift to right;Trunk flexed Gait velocity: decreased    General Gait Details: 25% VC's on proper walker to self distance and upright posture   Stairs             Wheelchair Mobility    Modified Rankin (Stroke Patients Only)       Balance                                            Cognition Arousal/Alertness: Awake/alert Behavior During Therapy: WFL for tasks assessed/performed Overall Cognitive Status: Within Functional Limits for tasks assessed                                 General Comments: pleasant and motivated      Exercises   Total Hip Replacement TE's 10 reps ankle pumps 10 reps knee presses 10 reps heel slides 10 reps SAQ's 10 reps ABD Followed by ICE     General Comments        Pertinent Vitals/Pain Pain Assessment: 0-10 Pain Score: 3  Pain Location: R hip Pain Descriptors / Indicators: Operative site guarding;Sore;Tender;Tightness Pain Intervention(s): Monitored during session;Repositioned;Ice applied    Home Living                      Prior Function            PT Goals (current goals can now be found in the care plan section) Progress towards PT goals: Progressing toward goals    Frequency    7X/week      PT Plan Current plan remains appropriate    Co-evaluation              AM-PAC PT "6 Clicks" Daily Activity  Outcome Measure  Difficulty turning over in bed (including adjusting bedclothes, sheets and blankets)?: A Lot Difficulty moving from lying on back to sitting on the side of the bed? : A Lot Difficulty sitting down on and standing up from a chair with  arms (e.g., wheelchair, bedside commode, etc,.)?: A Lot Help needed moving to and from a bed to chair (including a wheelchair)?: A Lot Help needed walking in hospital room?: A Lot Help needed climbing 3-5 steps with a railing? : Total 6 Click Score: 11    End of Session Equipment Utilized During Treatment: Gait belt Activity Tolerance: Patient tolerated treatment well Patient left: in chair;with call bell/phone within reach Nurse Communication: Mobility status PT Visit Diagnosis: Other abnormalities of gait and mobility (R26.89);Difficulty in walking, not elsewhere classified (R26.2)     Time: [id number] PT Time Calculation (min) (ACUTE ONLY): 16 min  Charges:  $Gait Training: 8-22 mins                     Felecia ShellingLori Jisell Majer  PTA  [facility]  Acute  Rehab Pager      [phone]

## 2018-07-03 NOTE — Progress Notes (Signed)
No pain overnight. Vitals stable.

## 2018-07-03 NOTE — Clinical Social Work Placement (Signed)
   CLINICAL SOCIAL WORK PLACEMENT  NOTE  Date:  07/03/2018  Patient Details  Name: Nicole [name] MRN: [id number] Date of Birth: 10-Jun-1961  Clinical Social Work is seeking post-discharge placement for this patient at the Skilled  Nursing Facility level of care (*CSW will initial, date and re-position this form in  chart as items are completed):  No   Patient/family provided with [facility] Clinical [facility] Work Department's list of facilities offering this level of care within the geographic area requested by the patient (or if unable, by the patient's family).  No   Patient/family informed of their freedom to choose among providers that offer the needed level of care, that participate in Medicare, Medicaid or managed care program needed by the patient, have an available bed and are willing to accept the patient.  No   Patient/family informed of [facility]'s ownership interest in [facility] and [facility], as well as of the fact that they are under no obligation to receive care at these facilities.  PASRR submitted to EDS on       PASRR number received on       Existing PASRR number confirmed on 07/01/18     FL2 transmitted to all facilities in geographic area requested by pt/family on       FL2 transmitted to all facilities within larger geographic area on 07/03/18     Patient informed that his/her managed care company has contracts with or will negotiate with certain facilities, including the following:  [facility]         Patient/family informed of bed offers received.  [facility]     Physician [facility] and [facility]    [facility].  Patient to be transferred to facility by PTAR      Patient family notified on 07/03/18 of transfer.  Name of family member notified:  Patient will notify her sister.      PHYSICIAN       Additional Comment:     _______________________________________________ Clearance CootsNicole A Kiven Vangilder, LCSW 07/03/2018, 10:42 AM
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
## 2018-07-03 NOTE — Progress Notes (Signed)
PATIENT ID: Nicole [name]  MRN: [id number]  DOB/AGE:  October 08, 1961 / 57 y.o.  3 Days Post-Op Procedure(s) (LRB): RIGHT TOTAL HIP ARTHROPLASTY ANTERIOR APPROACH (Right)    PROGRESS NOTE Subjective: Patient is alert, oriented, no Nausea, no Vomiting, yes passing gas, . Taking PO well. Denies SOB, Chest or Calf Pain. Using Incentive Spirometer, PAS in place. Ambulate 110' Patient reports pain as  1/10  .    Objective: Vital signs in last 24 hours: Vitals:   07/02/18 1328 07/02/18 1455 07/02/18 2133 07/03/18 0518  BP: 98/60 123/67 118/66 123/76  Pulse: 65 67 74 68  Resp: 18 18 17 18   Temp: 98.4 F (36.9 C) 98 F (36.7 C) 97.7 F (36.5 C) 97.9 F (36.6 C)  TempSrc: Oral Oral Oral Oral  SpO2: 100% 100% 99% 100%  Weight:      Height:          Intake/Output from previous day: I/O last 3 completed shifts: In: 1220 [P.O.:1220] Out: 1200 [Urine:1200]   Intake/Output this shift: No intake/output data recorded.   LABORATORY DATA: Recent Labs    07/01/18 0441  07/02/18 0517  07/02/18 1652 07/02/18 2214 07/03/18 0508 07/03/18 0715  WBC 10.5  --  21.4*  --   --   --  23.1*  --   HGB 11.2*  --  11.8*  --   --   --  11.0*  --   HCT 33.5*  --  34.6*  --   --   --  32.1*  --   PLT 180  --  194  --   --   --  209  --   NA 140  --   --   --   --   --   --   --   K 3.8  --   --   --   --   --   --   --   CL 109  --   --   --   --   --   --   --   CO2 24  --   --   --   --   --   --   --   BUN 12  --   --   --   --   --   --   --   CREATININE 0.59  --   --   --   --   --   --   --   GLUCOSE 136*  --   --   --   --   --   --   --   GLUCAP  --    < >  --    < > 210* 228*  --  139*  CALCIUM 8.4*  --   --   --   --   --   --   --    < > = values in this interval not displayed.    Examination: Neurologically intact ABD soft Neurovascular intact Sensation intact distally Intact pulses distally Dorsiflexion/Plantar flexion intact Incision: dressing C/D/I No cellulitis  present Compartment soft} XR AP&Lat of hip shows well placed\fixed THA  Assessment:   3 Days Post-Op Procedure(s) (LRB): RIGHT TOTAL HIP ARTHROPLASTY ANTERIOR APPROACH (Right) ADDITIONAL DIAGNOSIS:  Expected Acute Blood Loss Anemia, Hypertension, HIV, Hep C, Diabetes.  Plan: PT/OT WBAT, THA  DVT Prophylaxis: SCDx72 hrs, ASA 81 mg BID x 2 weeks  DISCHARGE PLAN: [facility] DISCHARGE NEEDS:  HHPT, Walker and 3-in-1 comode seat

## 2018-07-04 DIAGNOSIS — D72829 Elevated white blood cell count, unspecified: Secondary | ICD-10-CM | POA: Diagnosis not present

## 2018-07-04 DIAGNOSIS — I10 Essential (primary) hypertension: Secondary | ICD-10-CM | POA: Diagnosis not present

## 2018-07-04 DIAGNOSIS — M25551 Pain in right hip: Secondary | ICD-10-CM | POA: Diagnosis not present

## 2018-07-04 DIAGNOSIS — G8194 Hemiplegia, unspecified affecting left nondominant side: Secondary | ICD-10-CM | POA: Diagnosis not present

## 2018-07-04 DIAGNOSIS — R2681 Unsteadiness on feet: Secondary | ICD-10-CM | POA: Diagnosis not present

## 2018-07-04 DIAGNOSIS — Z21 Asymptomatic human immunodeficiency virus [HIV] infection status: Secondary | ICD-10-CM | POA: Diagnosis not present

## 2018-07-04 DIAGNOSIS — Z96641 Presence of right artificial hip joint: Secondary | ICD-10-CM | POA: Diagnosis not present

## 2018-07-04 LAB — GLUCOSE, CAPILLARY: Glucose-Capillary: 219 mg/dL — ABNORMAL HIGH (ref 70–99)

## 2018-07-08 DIAGNOSIS — Z794 Long term (current) use of insulin: Secondary | ICD-10-CM | POA: Diagnosis not present

## 2018-07-08 DIAGNOSIS — M25551 Pain in right hip: Secondary | ICD-10-CM | POA: Diagnosis not present

## 2018-07-08 DIAGNOSIS — R2681 Unsteadiness on feet: Secondary | ICD-10-CM | POA: Diagnosis not present

## 2018-07-08 DIAGNOSIS — I10 Essential (primary) hypertension: Secondary | ICD-10-CM | POA: Diagnosis not present

## 2018-07-08 DIAGNOSIS — Z96641 Presence of right artificial hip joint: Secondary | ICD-10-CM | POA: Diagnosis not present

## 2018-07-08 DIAGNOSIS — G8194 Hemiplegia, unspecified affecting left nondominant side: Secondary | ICD-10-CM | POA: Diagnosis not present

## 2018-07-08 DIAGNOSIS — E119 Type 2 diabetes mellitus without complications: Secondary | ICD-10-CM | POA: Diagnosis not present

## 2018-07-14 DIAGNOSIS — Z4789 Encounter for other orthopedic aftercare: Secondary | ICD-10-CM | POA: Diagnosis not present

## 2018-07-14 DIAGNOSIS — M6281 Muscle weakness (generalized): Secondary | ICD-10-CM | POA: Diagnosis not present

## 2018-07-16 DIAGNOSIS — Z96641 Presence of right artificial hip joint: Secondary | ICD-10-CM | POA: Diagnosis not present

## 2018-07-16 DIAGNOSIS — M1611 Unilateral primary osteoarthritis, right hip: Secondary | ICD-10-CM | POA: Diagnosis not present

## 2018-07-16 DIAGNOSIS — Z471 Aftercare following joint replacement surgery: Secondary | ICD-10-CM | POA: Diagnosis not present

## 2018-07-18 DIAGNOSIS — Z4789 Encounter for other orthopedic aftercare: Secondary | ICD-10-CM | POA: Diagnosis not present

## 2018-07-18 DIAGNOSIS — M6281 Muscle weakness (generalized): Secondary | ICD-10-CM | POA: Diagnosis not present

## 2018-07-23 DIAGNOSIS — M6281 Muscle weakness (generalized): Secondary | ICD-10-CM | POA: Diagnosis not present

## 2018-07-23 DIAGNOSIS — Z4789 Encounter for other orthopedic aftercare: Secondary | ICD-10-CM | POA: Diagnosis not present

## 2018-07-25 DIAGNOSIS — M6281 Muscle weakness (generalized): Secondary | ICD-10-CM | POA: Diagnosis not present

## 2018-07-25 DIAGNOSIS — Z4789 Encounter for other orthopedic aftercare: Secondary | ICD-10-CM | POA: Diagnosis not present

## 2018-07-28 DIAGNOSIS — Z4789 Encounter for other orthopedic aftercare: Secondary | ICD-10-CM | POA: Diagnosis not present

## 2018-07-28 DIAGNOSIS — M6281 Muscle weakness (generalized): Secondary | ICD-10-CM | POA: Diagnosis not present

## 2018-07-30 DIAGNOSIS — M6281 Muscle weakness (generalized): Secondary | ICD-10-CM | POA: Diagnosis not present

## 2018-07-30 DIAGNOSIS — Z4789 Encounter for other orthopedic aftercare: Secondary | ICD-10-CM | POA: Diagnosis not present

## 2018-08-01 DIAGNOSIS — Z4789 Encounter for other orthopedic aftercare: Secondary | ICD-10-CM | POA: Diagnosis not present

## 2018-08-01 DIAGNOSIS — M6281 Muscle weakness (generalized): Secondary | ICD-10-CM | POA: Diagnosis not present

## 2018-08-11 ENCOUNTER — Other Ambulatory Visit: Payer: Self-pay | Admitting: *Deleted

## 2018-08-11 DIAGNOSIS — B20 Human immunodeficiency virus [HIV] disease: Secondary | ICD-10-CM

## 2018-08-11 MED ORDER — POTASSIUM CHLORIDE CRYS ER 20 MEQ PO TBCR: 20.0000 meq | EXTENDED_RELEASE_TABLET | Freq: Every day | ORAL | 2 refills | Status: DC

## 2018-08-11 MED ORDER — ELVITEG-COBIC-EMTRICIT-TENOFAF 150-150-200-10 MG PO TABS: 1.0000 | ORAL_TABLET | Freq: Every day | ORAL | 11 refills | Status: DC

## 2018-08-13 DIAGNOSIS — Z96641 Presence of right artificial hip joint: Secondary | ICD-10-CM | POA: Diagnosis not present

## 2018-08-25 ENCOUNTER — Other Ambulatory Visit: Payer: Self-pay | Admitting: Internal Medicine

## 2018-09-24 DIAGNOSIS — M25551 Pain in right hip: Secondary | ICD-10-CM | POA: Diagnosis not present

## 2018-09-24 DIAGNOSIS — Z96641 Presence of right artificial hip joint: Secondary | ICD-10-CM | POA: Diagnosis not present

## 2018-09-24 DIAGNOSIS — Z471 Aftercare following joint replacement surgery: Secondary | ICD-10-CM | POA: Diagnosis not present

## 2018-10-02 ENCOUNTER — Telehealth: Payer: Self-pay | Admitting: Dietician

## 2018-10-02 NOTE — Telephone Encounter (Signed)
[name] bought new batteries for her blood glucose meter and needed help putting them in , she has not checked her blood sugar in several weeks or more. She could not do the set up over the phone so opted to come in tomorrow for help with her meter in person. Appointment scheduled for 10/14/18 to put on a CGM sensor.  Cecil Crankeronna M Plyler

## 2018-10-03 ENCOUNTER — Ambulatory Visit (INDEPENDENT_AMBULATORY_CARE_PROVIDER_SITE_OTHER): Payer: Medicare Other | Admitting: Dietician

## 2018-10-03 ENCOUNTER — Encounter: Payer: Self-pay | Admitting: Dietician

## 2018-10-03 ENCOUNTER — Encounter (INDEPENDENT_AMBULATORY_CARE_PROVIDER_SITE_OTHER): Payer: Self-pay

## 2018-10-03 DIAGNOSIS — E119 Type 2 diabetes mellitus without complications: Secondary | ICD-10-CM | POA: Diagnosis not present

## 2018-10-03 NOTE — Patient Instructions (Signed)
Please record the time, amount and what food drinks and activities you have while wearing the continuous glucose monitor(CGM).  Bring the folder with you to follow up appointments  Do not have a CT or an MRI while wearing the CGM.   Please make an appointment for 1 week with me and a doctor for the first of two CGM downloads..   You will also return in 2 weeks to have your second download and the CGM remove  Shawanna Zanders [phone] 

## 2018-10-03 NOTE — Progress Notes (Signed)
Documentation for Freestyle Libre Pro Continuous glucose monitoring Freestyle Libre Pro CGM sensor placed today. Patient was educated about wearing sensor, keeping food, activity and medication log and when to call office. Patient was educated about how to care for the sensor and not to have an MRI, CT or Diathermy while wearing the sensor. Follow up was arranged with the patient for 1 week.   Lot #: [id number] A Serial #: [id number] Expiration Date: 2019-03-12  Helped patient with her meter. Her blood glucose level measured 89 mg/dl about 2 1/2 hours after breakfast. Spoke with attending physician, Dr. Oswaldo DoneVincent, who approved a decrease in insulin to 16 units of Levemir at night.   Nicole [name], RD 10/03/2018 10:47 AM.

## 2018-10-08 ENCOUNTER — Other Ambulatory Visit: Payer: Self-pay | Admitting: Internal Medicine

## 2018-10-08 ENCOUNTER — Other Ambulatory Visit: Payer: Self-pay

## 2018-10-08 ENCOUNTER — Other Ambulatory Visit: Payer: Medicare Other

## 2018-10-08 ENCOUNTER — Ambulatory Visit (INDEPENDENT_AMBULATORY_CARE_PROVIDER_SITE_OTHER): Payer: Medicare Other | Admitting: Internal Medicine

## 2018-10-08 VITALS — BP 107/67 | HR 78 | Temp 97.7°F | Wt 251.3 lb

## 2018-10-08 DIAGNOSIS — Z79899 Other long term (current) drug therapy: Secondary | ICD-10-CM | POA: Diagnosis not present

## 2018-10-08 DIAGNOSIS — Z720 Tobacco use: Secondary | ICD-10-CM

## 2018-10-08 DIAGNOSIS — I10 Essential (primary) hypertension: Secondary | ICD-10-CM | POA: Diagnosis not present

## 2018-10-08 DIAGNOSIS — E119 Type 2 diabetes mellitus without complications: Secondary | ICD-10-CM | POA: Diagnosis not present

## 2018-10-08 DIAGNOSIS — E118 Type 2 diabetes mellitus with unspecified complications: Secondary | ICD-10-CM

## 2018-10-08 NOTE — Addendum Note (Signed)
Addended by: Erlinda HongVINCENT, Keylen Uzelac T on: 10/08/2018 01:13 PM   Modules accepted: Level of Service
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
## 2018-10-08 NOTE — Patient Instructions (Signed)
Thank you for allowing us to provide your care today. Today we discussed your diabetes. It was nice meeting you.  The monitor showed that you are having low blood sugar levels, this can be very dangerous so we need to make adjustments to your medications.   Please stop taking the insulin that you take at night.  Continue to eat a healthy diet and exercise regimen.   Please return in 7 days with [facility] for Download #2.  Should you have any questions or concerns please call the internal medicine clinic at [phone].

## 2018-10-08 NOTE — Progress Notes (Signed)
Internal Medicine Clinic Attending  I saw and evaluated the patient.  I personally confirmed the key portions of the history and exam documented by Dr. Krienke and I reviewed pertinent patient test results.  The assessment, diagnosis, and plan were formulated together and I agree with the documentation in the resident's note.    

## 2018-10-08 NOTE — Assessment & Plan Note (Signed)
Blood pressure today is 107/67, at goal.  She is currently on lisinopril-HCTZ 20-12.5 mg tablets daily.  She reports that she is taking the medications with no issues, denies any side effects of medications.  Plan: -Continue lisinopril-HCTZ 20-12.5 mg tablets daily

## 2018-10-08 NOTE — Assessment & Plan Note (Signed)
Nicole [name] wore the CGM for 6 days. The average reading was 84, % time in target was 70, % time below target was 30, and % time above target was. 0. Intervention will be to stop insulin. [facility]   Patient reports that she has not been having any symptoms of hypoglycemia such as dizziness, lightheadedness, shaking, sweating, or low energy.  She denies any other symptoms at this time including fevers, chills, nausea, vomiting, numbness or tingling in her hands or feet, or change in her appetite.  She is currently on Levemir 16 units daily and Victoza 1.2 mg daily.  Last A1c was 5.6.  She appears to be in 100 perceive her of her hypoglycemia and she is spending is significantly amount of time being hypoglycemic.  Given her last A1c in the amount of time she is hypoglycemic at 7 unclear if she even needs the insulin.  She has never had DKA before.  We will discontinue insulin now and reassess on her next visit.  She may benefit from an oral diabetic medication such as a SGLT 2 or GLP-1.  Plan: -Discontinue insulin, advised patient to continue checking her sugars -Continue Victoza 1.2 mg daily -Return to clinic in 1 week for second CGM download -May benefit from a SGLT-2 or GLP-1 medication if she is hyperglycemic

## 2018-10-14 ENCOUNTER — Ambulatory Visit: Payer: Medicare Other | Admitting: Dietician

## 2018-10-15 ENCOUNTER — Ambulatory Visit: Payer: Medicare Other | Admitting: Dietician

## 2018-10-15 ENCOUNTER — Ambulatory Visit: Payer: Medicare Other

## 2018-10-16 ENCOUNTER — Ambulatory Visit (INDEPENDENT_AMBULATORY_CARE_PROVIDER_SITE_OTHER): Payer: Medicare Other | Admitting: Dietician

## 2018-10-16 ENCOUNTER — Other Ambulatory Visit: Payer: Self-pay

## 2018-10-16 ENCOUNTER — Encounter: Payer: Self-pay | Admitting: Internal Medicine

## 2018-10-16 ENCOUNTER — Ambulatory Visit (INDEPENDENT_AMBULATORY_CARE_PROVIDER_SITE_OTHER): Payer: Medicare Other | Admitting: Internal Medicine

## 2018-10-16 ENCOUNTER — Encounter: Payer: Self-pay | Admitting: Dietician

## 2018-10-16 VITALS — BP 127/76 | HR 68 | Temp 98.0°F | Ht 66.0 in | Wt 250.7 lb

## 2018-10-16 DIAGNOSIS — Z7984 Long term (current) use of oral hypoglycemic drugs: Secondary | ICD-10-CM

## 2018-10-16 DIAGNOSIS — E119 Type 2 diabetes mellitus without complications: Secondary | ICD-10-CM | POA: Diagnosis not present

## 2018-10-16 DIAGNOSIS — F17210 Nicotine dependence, cigarettes, uncomplicated: Secondary | ICD-10-CM | POA: Diagnosis not present

## 2018-10-16 DIAGNOSIS — Z713 Dietary counseling and surveillance: Secondary | ICD-10-CM | POA: Diagnosis not present

## 2018-10-16 DIAGNOSIS — Z23 Encounter for immunization: Secondary | ICD-10-CM | POA: Diagnosis not present

## 2018-10-16 LAB — POCT GLYCOSYLATED HEMOGLOBIN (HGB A1C): Hemoglobin A1C: 5.6 % (ref 4.0–5.6)

## 2018-10-16 LAB — GLUCOSE, CAPILLARY: Glucose-Capillary: 78 mg/dL (ref 70–99)

## 2018-10-16 MED ORDER — DAPAGLIFLOZIN PROPANEDIOL 5 MG PO TABS: 5.0000 mg | ORAL_TABLET | Freq: Every day | ORAL | 0 refills | Status: DC

## 2018-10-16 NOTE — Assessment & Plan Note (Signed)
Nicole [name] wore the CGM for 14 days. The average reading was 85, % time in target was 81, % time below target was 19, and % time above target was. 0. Intervention will be to discontinue Victoza.   Patient's diabetes is now well controlled. She has decreased her hemoglobin A1c from 13 to <6 and lost approximately 20 pounds in the past 18 months. We discussed transitioning her from injection medications to oral medications. Upon further discussion with Dr. Heide SparkNarendra it was elected to start the patient on an SLGT-2 inhibitor, Farxiga 5 mg daily. She does not have a history of DKA. This should provide additional cardiovascular protection. We discussed the side effects including frequent yeast infections. She will follow-up with me in January to see how she is tolerating the medication.  She will need to be started on a high intensity statin at her next visit. Influenza vaccine given today. She is on an ACE inhibitor.

## 2018-10-16 NOTE — Patient Instructions (Signed)
Please make an appointment to follow up in March.   Lupita LeashDonna [phone]
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
## 2018-10-16 NOTE — Progress Notes (Signed)
Internal Medicine Clinic Attending  Case discussed with Dr. Helberg at the time of the visit.  We reviewed the resident's history and exam and pertinent patient test results.  I agree with the assessment, diagnosis, and plan of care documented in the resident's note.    

## 2018-10-16 NOTE — Progress Notes (Signed)
Diabetes Self-Management Education  Visit Type:  Follow-up  Appt. Start Time: 830 Appt. End Time: 900  10/16/2018  Ms. Elray McgregorCynthia Collums, identified by name and date of birth, is a 57 y.o. female with a diagnosis of Diabetes:  .   ASSESSMENT Patient presented for CGM download #2. She brought completed food and activity records showing 3 well-balanced meals a day and an apple everything at bedtime. She was taken off her insulin last week but has continued 1.2 mg Victoza per day. She likes the effect of the Victoza on her appetite and weight. Reviewed Ms. Langelier's CGM download with her. Her glucose pattern was consistent with three well balanced meals a day, evidenced by fasting blood sugars ~75-85 mg/dl, and post-prandial blood sugars ~130-150. Discussed with Dr. Caron PresumeHelberg. Patient agreed to follow up in three months for possible repeat CGM.   Her baseline weight as of 08/2017 was 268 lbs Wt Readings from Last 5 Encounters:  10/16/18 250 lb 11.2 oz (113.7 kg)  10/08/18 251 lb 4.8 oz (114 kg)  06/30/18 251 lb 15.8 oz (114.3 kg)  06/20/18 252 lb (114.3 kg)  06/17/18 256 lb (116.1 kg)   Diabetes Self-Management Education - 10/16/18 1000      Complications   Last HgB A1C per patient/outside source  --   5.6   Fasting Blood glucose range (mg/dL)  [phone]    Postprandial Blood glucose range (mg/dL)  60-454;[phone];130-179    Number of hypoglycemic episodes per month  --   0   Number of hyperglycemic episodes per week  --   0     Dietary Intake   Breakfast  --   oatmeal, black coffee   Lunch  --   tuna sandwich, lettuce, tomato, diet Coke   Dinner  --   meatloaf, or baked chicken, rice, potatoes or pasta and veg   Snack (evening)  --   apple   Beverage(s)  --   water, diet coke, black coffee     Patient Education   Monitoring  Other (comment)   CGM     Outcomes   Program Status  Completed   will complete over the phone       Learning Objective:  Patient will have a greater understanding of  diabetes self-management. Patient education plan is to attend individual and/or group sessions per assessed needs and concerns.   Plan:   Patient Instructions  Please make an appointment to follow up in March.   Lupita LeashDonna [phone]     Expected Outcomes:  Demonstrated interest in learning. Expect positive outcomes  If problems or questions, patient to contact team via:  Phone  Future DSME appointment: - 3-4 months(follow-up by phone on goals and for post ed assess. in 2020) Norm Parcelonna Plyler, RD 10/16/2018 10:51 AM.

## 2018-10-16 NOTE — Patient Instructions (Signed)
Thank you for allowing us to provide your care. Today we are stopping Victoza, and starting a medication called [place]xiga. You will take this once daily. I will see you back in one month.  Keep up the great work with your diabetes!

## 2018-10-17 ENCOUNTER — Ambulatory Visit: Payer: Medicare Other

## 2018-11-20 ENCOUNTER — Ambulatory Visit (INDEPENDENT_AMBULATORY_CARE_PROVIDER_SITE_OTHER): Payer: Medicare Other | Admitting: Internal Medicine

## 2018-11-20 ENCOUNTER — Encounter: Payer: Self-pay | Admitting: Internal Medicine

## 2018-11-20 ENCOUNTER — Other Ambulatory Visit: Payer: Self-pay

## 2018-11-20 VITALS — BP 132/74 | HR 78 | Temp 97.7°F | Ht 66.0 in | Wt 248.2 lb

## 2018-11-20 DIAGNOSIS — E119 Type 2 diabetes mellitus without complications: Secondary | ICD-10-CM | POA: Diagnosis not present

## 2018-11-20 DIAGNOSIS — I10 Essential (primary) hypertension: Secondary | ICD-10-CM

## 2018-11-20 MED ORDER — PRAVASTATIN SODIUM 40 MG PO TABS: 40.0000 mg | ORAL_TABLET | Freq: Every day | ORAL | 1 refills | Status: DC

## 2018-11-20 NOTE — Patient Instructions (Signed)
Thank you for allowing [place] to provide your care. Keep up the great work with your diabetes, hypertension, and weight loss. Today we are adding a medication called pravastatin. Please take this medication at night. The most common side effect is muscle aches. If you experience this please call the office and discontinue the medication.   I will see you back in 6 months or sooner if anything arises.

## 2018-11-20 NOTE — Assessment & Plan Note (Addendum)
HPI: Well controlled DM on Farxiga. Last seen in Dec 2019 and at that point was transitioned from all injection medications to [place]. She is doing well with the medication and not experiencing any adverse reactions. Able to afford the medications. She continues to lose weight.   Blood sugar report evaluated today. Patient checks 1 time per day. Average blood sugar is 116 with the highest being 160 and the lowest being 82.   A/P: Well controlled DM on Farxiga monotherapy. Continue current treatment. She is already on an ACE-I and we will start pravastatin today. This does not interact with Genvoya.

## 2018-11-20 NOTE — Progress Notes (Signed)
Internal Medicine Clinic Attending  Case discussed with Dr. Helberg at the time of the visit.  We reviewed the resident's history and exam and pertinent patient test results.  I agree with the assessment, diagnosis, and plan of care documented in the resident's note.    
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
## 2018-11-20 NOTE — Assessment & Plan Note (Signed)
HPI: Well controlled HTN on Lisinopril and HCTZ. She is not having any financial issues or side effects from the medications. She continues to take potassium supplementations. Last BMP don in August 2019 with normal renal function and potassium.   A/P: Well controlled HTN on Lisinopril 20 mg and HCTZ 12.5 mg QD. Check BMP at next visit.

## 2018-12-23 DIAGNOSIS — M25551 Pain in right hip: Secondary | ICD-10-CM | POA: Diagnosis not present

## 2018-12-23 DIAGNOSIS — Z96641 Presence of right artificial hip joint: Secondary | ICD-10-CM | POA: Diagnosis not present

## 2018-12-31 ENCOUNTER — Other Ambulatory Visit: Payer: Self-pay | Admitting: Internal Medicine

## 2019-01-05 ENCOUNTER — Other Ambulatory Visit: Payer: Self-pay | Admitting: Internal Medicine

## 2019-01-15 ENCOUNTER — Encounter: Payer: Self-pay | Admitting: Dietician

## 2019-01-15 ENCOUNTER — Other Ambulatory Visit: Payer: Self-pay | Admitting: Dietician

## 2019-01-15 ENCOUNTER — Ambulatory Visit (INDEPENDENT_AMBULATORY_CARE_PROVIDER_SITE_OTHER): Payer: Medicare Other | Admitting: Dietician

## 2019-01-15 VITALS — Wt 243.5 lb

## 2019-01-15 DIAGNOSIS — E119 Type 2 diabetes mellitus without complications: Secondary | ICD-10-CM | POA: Diagnosis not present

## 2019-01-15 DIAGNOSIS — Z713 Dietary counseling and surveillance: Secondary | ICD-10-CM | POA: Diagnosis not present

## 2019-01-15 DIAGNOSIS — Z6839 Body mass index (BMI) 39.0-39.9, adult: Secondary | ICD-10-CM | POA: Diagnosis not present

## 2019-01-15 LAB — POCT GLYCOSYLATED HEMOGLOBIN (HGB A1C): Hemoglobin A1C: 5.8 % — AB (ref 4.0–5.6)

## 2019-01-15 LAB — GLUCOSE, CAPILLARY: GLUCOSE-CAPILLARY: 82 mg/dL (ref 70–99)

## 2019-01-15 MED ORDER — DAPAGLIFLOZIN PROPANEDIOL 5 MG PO TABS: 5.0000 mg | ORAL_TABLET | Freq: Every day | ORAL | 0 refills | Status: DC

## 2019-01-15 NOTE — Patient Instructions (Signed)
I am going to ask for a foot doctor appointment for you to look at your callus on your right foot.  You are doing a fantastic job caring for your diabetes!!!   Your weight and A1C are both going down which is the healthy direction!!!  Lupita Leash [phone]

## 2019-01-15 NOTE — Progress Notes (Signed)
Diabetes Self-Management Education  Visit Type:  Follow-up  Appt. Start Time: 900 Appt. End Time: 1000  01/15/2019  [name], identified by name and date of birth, is a 58 y.o. female with a diagnosis of Diabetes:  Marland Kitchen  Type 2 ASSESSMENT  Weight 243 lb 8 oz (110.5 kg). Body mass index is 39.3 kg/m.  Wt Readings from Last 5 Encounters:  01/15/19 243 lb 8 oz (110.5 kg)  11/20/18 248 lb 3.2 oz (112.6 kg)  10/16/18 250 lb 11.2 oz (113.7 kg)  10/08/18 251 lb 4.8 oz (114 kg)  06/30/18 251 lb 15.8 oz (114.3 kg)    Lab Results  Component Value Date   HGBA1C 5.8 (A) 01/15/2019   HGBA1C 5.6 10/16/2018   HGBA1C 5.6 06/30/2018   HGBA1C 5.7 (A) 05/22/2018   HGBA1C 5.8 02/27/2018    Suggest decreasing a1c modifier to every 6 months. Has large callus under metatarsals of right foot and one between first and second toes and smokes. Her feet are high risk. She reports checking them daily. Recommend podiatry referral.   Diabetes Self-Management Education - 01/15/19 0900      Health Coping   How would you rate your overall health?  Good      Patient Self-Evaluation of Goals - Patient rates self as meeting previously set goals (% of time)   Nutrition  >75%    Medications  >75%    Monitoring  >75%    Reducing Risk  25 - 50%      Post-Education Assessment   Patient understands the diabetes disease and treatment process.  Demonstrates understanding / competency    Patient understands incorporating nutritional management into lifestyle.  Demonstrates understanding / competency    Patient undertands incorporating physical activity into lifestyle.  Demonstrates understanding / competency    Patient understands using medications safely.  Demonstrates understanding / competency    Patient understands monitoring blood glucose, interpreting and using results  Demonstrates understanding / competency    Patient understands prevention, detection, and treatment of acute complications.  Needs  Review   needed assistance with stating symptoms and what to do   Patient understands prevention, detection, and treatment of chronic complications.  Needs Review   unsure of what she needed to do to prevent complications     Outcomes   Program Status  Completed      Subsequent Visit   Since your last visit have you continued or begun to take your medications as prescribed?  Yes    Since your last visit have you had your blood pressure checked?  No    Since your last visit have you experienced any weight changes?  Loss    Weight Loss (lbs)  5    Since your last visit, are you checking your blood glucose at least once a day?  Yes       Learning Objective:  Patient will have a greater understanding of diabetes self-management. Patient education plan is to attend individual and/or group sessions per assessed needs and concerns.  Her plan for support of her diabetes self care training is to attend her doctor and diabetes educator/nutrtion appointments.   Plan:   Patient Instructions  I am going to ask for a foot doctor appointment for you to look at your callus on your right foot.  You are doing a fantastic job caring for your diabetes!!!   Your weight and A1C are both going down which is the healthy direction!!!  Lupita Leash [phone]  Expected Outcomes:  Demonstrated interest in learning. Expect positive outcomes  Education material provided:  After visit summary  If problems or questions, patient to contact team via:  Phone  Future DSME appointment: - 3-4 months(she agreed to same day as she seees doctor)   Norm Parcel, RD 01/15/2019 11:19 AM.

## 2019-01-15 NOTE — Telephone Encounter (Signed)
Nicole [name] requests refill on farxiga.

## 2019-01-17 NOTE — Addendum Note (Signed)
Addended by: Levora Dredge T on: 01/17/2019 06:36 AM   Modules accepted: Orders

## 2019-01-26 DIAGNOSIS — H35033 Hypertensive retinopathy, bilateral: Secondary | ICD-10-CM | POA: Diagnosis not present

## 2019-01-26 DIAGNOSIS — H2513 Age-related nuclear cataract, bilateral: Secondary | ICD-10-CM | POA: Diagnosis not present

## 2019-01-26 DIAGNOSIS — E119 Type 2 diabetes mellitus without complications: Secondary | ICD-10-CM | POA: Diagnosis not present

## 2019-01-26 DIAGNOSIS — H04123 Dry eye syndrome of bilateral lacrimal glands: Secondary | ICD-10-CM | POA: Diagnosis not present

## 2019-01-26 LAB — HM DIABETES EYE EXAM

## 2019-01-28 ENCOUNTER — Encounter: Payer: Self-pay | Admitting: *Deleted

## 2019-02-03 ENCOUNTER — Other Ambulatory Visit: Payer: Self-pay

## 2019-02-03 ENCOUNTER — Ambulatory Visit (INDEPENDENT_AMBULATORY_CARE_PROVIDER_SITE_OTHER): Payer: Medicare Other | Admitting: Podiatry

## 2019-02-03 DIAGNOSIS — B351 Tinea unguium: Secondary | ICD-10-CM

## 2019-02-03 DIAGNOSIS — Z21 Asymptomatic human immunodeficiency virus [HIV] infection status: Secondary | ICD-10-CM

## 2019-02-03 DIAGNOSIS — M79676 Pain in unspecified toe(s): Secondary | ICD-10-CM | POA: Diagnosis not present

## 2019-02-03 DIAGNOSIS — E119 Type 2 diabetes mellitus without complications: Secondary | ICD-10-CM | POA: Diagnosis not present

## 2019-02-03 DIAGNOSIS — L84 Corns and callosities: Secondary | ICD-10-CM

## 2019-02-03 NOTE — Patient Instructions (Signed)
Diabetes Mellitus and Foot Care Foot care is an important part of your health, especially when you have diabetes. Diabetes may cause you to have problems because of poor blood flow (circulation) to your feet and legs, which can cause your skin to:  Become thinner and drier.  Break more easily.  Heal more slowly.  Peel and crack. You may also have nerve damage (neuropathy) in your legs and feet, causing decreased feeling in them. This means that you may not notice minor injuries to your feet that could lead to more serious problems. Noticing and addressing any potential problems early is the best way to prevent future foot problems. How to care for your feet Foot hygiene  Wash your feet daily with warm water and mild soap. Do not use hot water. Then, pat your feet and the areas between your toes until they are completely dry. Do not soak your feet as this can dry your skin.  Trim your toenails straight across. Do not dig under them or around the cuticle. File the edges of your nails with an emery board or nail file.  Apply a moisturizing lotion or petroleum jelly to the skin on your feet and to dry, brittle toenails. Use lotion that does not contain alcohol and is unscented. Do not apply lotion between your toes. Shoes and socks  Wear clean socks or stockings every day. Make sure they are not too tight. Do not wear knee-high stockings since they may decrease blood flow to your legs.  Wear shoes that fit properly and have enough cushioning. Always look in your shoes before you put them on to be sure there are no objects inside.  To break in new shoes, wear them for just a few hours a day. This prevents injuries on your feet. Wounds, scrapes, corns, and calluses  Check your feet daily for blisters, cuts, bruises, sores, and redness. If you cannot see the bottom of your feet, use a mirror or ask someone for help.  Do not cut corns or calluses or try to remove them with medicine.  If you  find a minor scrape, cut, or break in the skin on your feet, keep it and the skin around it clean and dry. You may clean these areas with mild soap and water. Do not clean the area with peroxide, alcohol, or iodine.  If you have a wound, scrape, corn, or callus on your foot, look at it several times a day to make sure it is healing and not infected. Check for: ? Redness, swelling, or pain. ? Fluid or blood. ? Warmth. ? Pus or a bad smell. General instructions  Do not cross your legs. This may decrease blood flow to your feet.  Do not use heating pads or hot water bottles on your feet. They may burn your skin. If you have lost feeling in your feet or legs, you may not know this is happening until it is too late.  Protect your feet from hot and cold by wearing shoes, such as at the beach or on hot pavement.  Schedule a complete foot exam at least once a year (annually) or more often if you have foot problems. If you have foot problems, report any cuts, sores, or bruises to your health care provider immediately. Contact a health care provider if:  You have a medical condition that increases your risk of infection and you have any cuts, sores, or bruises on your feet.  You have an injury that is not   healing.  You have redness on your legs or feet.  You feel burning or tingling in your legs or feet.  You have pain or cramps in your legs and feet.  Your legs or feet are numb.  Your feet always feel cold.  You have pain around a toenail. Get help right away if:  You have a wound, scrape, corn, or callus on your foot and: ? You have pain, swelling, or redness that gets worse. ? You have fluid or blood coming from the wound, scrape, corn, or callus. ? Your wound, scrape, corn, or callus feels warm to the touch. ? You have pus or a bad smell coming from the wound, scrape, corn, or callus. ? You have a fever. ? You have a red line going up your leg. Summary  Check your feet every day  for cuts, sores, red spots, swelling, and blisters.  Moisturize feet and legs daily.  Wear shoes that fit properly and have enough cushioning.  If you have foot problems, report any cuts, sores, or bruises to your health care provider immediately.  Schedule a complete foot exam at least once a year (annually) or more often if you have foot problems. This information is not intended to replace advice given to you by your health care provider. Make sure you discuss any questions you have with your health care provider. Document Released: 10/26/2000 Document Revised: 12/11/2017 Document Reviewed: 11/30/2016 Elsevier Interactive Patient Education  2019 [employer]  Onychomycosis/Fungal Toenails  WHAT IS IT? An infection that lies within the keratin of your nail plate that is caused by a fungus.  WHY ME? Fungal infections affect all ages, sexes, races, and creeds.  There may be many factors that predispose you to a fungal infection such as age, coexisting medical conditions such as diabetes, or an autoimmune disease; stress, medications, fatigue, genetics, etc.  Bottom line: fungus thrives in a warm, moist environment and your shoes offer such a location.  IS IT CONTAGIOUS? Theoretically, yes.  You do not want to share shoes, nail clippers or files with someone who has fungal toenails.  Walking around barefoot in the same room or sleeping in the same bed is unlikely to transfer the organism.  It is important to realize, however, that fungus can spread easily from one nail to the next on the same foot.  HOW DO WE TREAT THIS?  There are several ways to treat this condition.  Treatment may depend on many factors such as age, medications, pregnancy, liver and kidney conditions, etc.  It is best to ask your doctor which options are available to you.  1. No treatment.   Unlike many other medical concerns, you can live with this condition.  However for many people this can be a painful condition and  may lead to ingrown toenails or a bacterial infection.  It is recommended that you keep the nails cut short to help reduce the amount of fungal nail. 2. Topical treatment.  These range from herbal remedies to prescription strength nail lacquers.  About 40-50% effective, topicals require twice daily application for approximately 9 to 12 months or until an entirely new nail has grown out.  The most effective topicals are medical grade medications available through physicians offices. 3. Oral antifungal medications.  With an 80-90% cure rate, the most common oral medication requires 3 to 4 months of therapy and stays in your system for a year as the new nail grows out.  Oral antifungal medications do require   blood work to make sure it is a safe drug for you.  A liver function panel will be performed prior to starting the medication and after the first month of treatment.  It is important to have the blood work performed to avoid any harmful side effects.  In general, this medication safe but blood work is required. 4. Laser Therapy.  This treatment is performed by applying a specialized laser to the affected nail plate.  This therapy is noninvasive, fast, and non-painful.  It is not covered by insurance and is therefore, out of pocket.  The results have been very good with a 80-95% cure rate.  The [facility] is the only practice in the area to offer this therapy. 5. Permanent Nail Avulsion.  Removing the entire nail so that a new nail will not grow back. 

## 2019-02-04 ENCOUNTER — Other Ambulatory Visit: Payer: Self-pay | Admitting: Internal Medicine

## 2019-02-09 ENCOUNTER — Encounter: Payer: Self-pay | Admitting: Podiatry

## 2019-02-09 NOTE — Progress Notes (Signed)
Subjective: Nicole [name] is a 58 y.o. y.o. female who presents to clinic for preventative diabetic foot care on today with cc of painful, discolored, thick toenails and painful callus/corn which interfere with daily activities. Pain is aggravated when wearing enclosed shoe gear and relieved with periodic professional debridement.  Levora Dredge, MD is her PCP. Last visit 10/16/2018.   Current Outpatient Medications:  .  ACCU-CHEK FASTCLIX LANCETS MISC, Check blood sugar one time a day, Disp: 102 each, Rfl: 5 .  ACCU-CHEK GUIDE test strip, CHECK BLOOD SUGAR ONE TIME A DAY, Disp: 100 each, Rfl: 1 .  dapagliflozin propanediol (FARXIGA) 5 MG TABS tablet, Take 5 mg by mouth daily., Disp: 90 tablet, Rfl: 0 .  elvitegravir-cobicistat-emtricitabine-tenofovir (GENVOYA) 150-150-200-10 MG TABS tablet, Take 1 tablet by mouth daily with breakfast., Disp: 30 tablet, Rfl: 11 .  glucose blood (ACCU-CHEK GUIDE) test strip, Check blood sugar one time daily, Disp: 100 each, Rfl: 5 .  halobetasol (ULTRAVATE) 0.05 % ointment, Apply to the affected area twice daily, Disp: 30 g, Rfl: 0 .  lisinopril-hydrochlorothiazide (PRINZIDE,ZESTORETIC) 20-12.5 MG tablet, TAKE ONE TABLET BY MOUTH DAILY., Disp: 90 tablet, Rfl: 0 .  potassium chloride SA (K-DUR,KLOR-CON) 20 MEQ tablet, Take 1 tablet (20 mEq total) by mouth daily., Disp: 90 tablet, Rfl: 2 .  pravastatin (PRAVACHOL) 40 MG tablet, Take 1 tablet (40 mg total) by mouth at bedtime., Disp: 90 tablet, Rfl: 1  Allergies  Allergen Reactions  . Cephalexin Rash    Objective:  Vascular Examination: Capillary refill time <3 seconds x 10 digits.  Dorsalis pedis pulses palpable b/l.  Posterior tibial pulses palpable b/l.  Digital hair diminished x 10 digits.  Skin temperature gradient WNL b/l.  Dermatological Examination: Skin with normal turgor, texture and tone b/l.  Toenails 1-5 b/l discolored, thick, dystrophic with subungual debris and pain with palpation  to nailbeds due to thickness of nails.  Hyperkeratotic lesion submetatarsal head 5 b/l and submet head 2 right foot. No erythema, no edema, no drainage, no flocculence noted.   Musculoskeletal: Muscle strength 5/5 to all LE muscle groups  Neurological: Sensation intact with 10 gram monofilament.  Assessment: 1.  Painful onychomycosis toenails 1-5 b/l 2.  Callus submetatarsal head 5 b/l and submet head 2 right foot 3.  NIDDM 4.  HIV  Plan: 1. Continue diabetic foot care principles. Literature dispensed on today. 2. Toenails 1-5 b/l were debrided in length and girth without iatrogenic bleeding. 3. Hyperkeratotic lesion(s) submetatarsal head 5 b/l and submet head 2 right foot pared with sterile scalpel blade without incident. 4. Patient to continue soft, supportive shoe gear daily. 5. Patient to report any pedal injuries to medical professional immediately. 6. Follow up 3 months.  7. Patient/POA to call should there be a concern in the interim.

## 2019-02-17 ENCOUNTER — Other Ambulatory Visit: Payer: Self-pay | Admitting: Internal Medicine

## 2019-02-17 DIAGNOSIS — E119 Type 2 diabetes mellitus without complications: Secondary | ICD-10-CM

## 2019-03-16 ENCOUNTER — Other Ambulatory Visit: Payer: Self-pay | Admitting: Internal Medicine

## 2019-04-01 ENCOUNTER — Telehealth: Payer: Self-pay | Admitting: *Deleted

## 2019-04-01 NOTE — Telephone Encounter (Signed)
Pt calls and states she needs pen needles, reviewed chart, see where insulin was stopped 10/2018. She states she had some in the house and didn't want to waste it. She has been taking oral and injectable diab med. Ask her to come in for appt, refuses. Will send to [facility]. And dr Caron Presume for advisement

## 2019-04-01 NOTE — Telephone Encounter (Signed)
Called Nicole [name]. No answer. No voicemail so could not leave a message.

## 2019-04-12 ENCOUNTER — Other Ambulatory Visit: Payer: Self-pay | Admitting: Internal Medicine

## 2019-04-12 DIAGNOSIS — E119 Type 2 diabetes mellitus without complications: Secondary | ICD-10-CM

## 2019-04-12 DIAGNOSIS — E118 Type 2 diabetes mellitus with unspecified complications: Secondary | ICD-10-CM

## 2019-04-14 ENCOUNTER — Other Ambulatory Visit: Payer: Self-pay | Admitting: Internal Medicine

## 2019-04-14 DIAGNOSIS — E876 Hypokalemia: Secondary | ICD-10-CM

## 2019-04-15 ENCOUNTER — Encounter: Payer: Self-pay | Admitting: Podiatry

## 2019-05-07 ENCOUNTER — Ambulatory Visit: Payer: Medicare Other | Admitting: Podiatry

## 2019-05-28 ENCOUNTER — Encounter: Payer: Medicare Other | Admitting: Internal Medicine

## 2019-05-28 ENCOUNTER — Ambulatory Visit: Payer: Medicare Other | Admitting: Dietician

## 2019-06-04 ENCOUNTER — Other Ambulatory Visit: Payer: Medicare Other

## 2019-06-04 ENCOUNTER — Other Ambulatory Visit: Payer: Self-pay

## 2019-06-04 DIAGNOSIS — B20 Human immunodeficiency virus [HIV] disease: Secondary | ICD-10-CM

## 2019-06-05 LAB — T-HELPER CELL (CD4) - (RCID CLINIC ONLY)
CD4 % Helper T Cell: 44 % (ref 33–65)
CD4 T Cell Abs: 1273 /uL (ref 400–1790)

## 2019-06-08 ENCOUNTER — Encounter: Payer: Self-pay | Admitting: Gastroenterology

## 2019-06-11 LAB — CBC
HCT: 43.5 % (ref 35.0–45.0)
Hemoglobin: 14.4 g/dL (ref 11.7–15.5)
MCH: 30 pg (ref 27.0–33.0)
MCHC: 33.1 g/dL (ref 32.0–36.0)
MCV: 90.6 fL (ref 80.0–100.0)
MPV: 11.6 fL (ref 7.5–12.5)
Platelets: 219 10*3/uL (ref 140–400)
RBC: 4.8 10*6/uL (ref 3.80–5.10)
RDW: 13.4 % (ref 11.0–15.0)
WBC: 8.6 10*3/uL (ref 3.8–10.8)

## 2019-06-11 LAB — COMPREHENSIVE METABOLIC PANEL
AG Ratio: 1.4 (calc) (ref 1.0–2.5)
ALT: 11 U/L (ref 6–29)
AST: 13 U/L (ref 10–35)
Albumin: 4.2 g/dL (ref 3.6–5.1)
Alkaline phosphatase (APISO): 51 U/L (ref 37–153)
BUN: 16 mg/dL (ref 7–25)
CO2: 22 mmol/L (ref 20–32)
Calcium: 9.7 mg/dL (ref 8.6–10.4)
Chloride: 110 mmol/L (ref 98–110)
Creat: 0.7 mg/dL (ref 0.50–1.05)
Globulin: 3.1 g/dL (calc) (ref 1.9–3.7)
Glucose, Bld: 131 mg/dL — ABNORMAL HIGH (ref 65–99)
Potassium: 4 mmol/L (ref 3.5–5.3)
Sodium: 141 mmol/L (ref 135–146)
Total Bilirubin: 0.4 mg/dL (ref 0.2–1.2)
Total Protein: 7.3 g/dL (ref 6.1–8.1)

## 2019-06-11 LAB — RPR: RPR Ser Ql: NONREACTIVE

## 2019-06-11 LAB — HIV-1 RNA QUANT-NO REFLEX-BLD
HIV 1 RNA Quant: <20 copies/mL
HIV-1 RNA Quant, Log: <1.3 Log copies/mL

## 2019-06-16 ENCOUNTER — Ambulatory Visit: Payer: Medicare Other | Admitting: Internal Medicine

## 2019-06-18 ENCOUNTER — Encounter: Payer: Medicare Other | Admitting: Internal Medicine

## 2019-06-23 ENCOUNTER — Other Ambulatory Visit: Payer: Self-pay

## 2019-06-23 ENCOUNTER — Encounter: Payer: Self-pay | Admitting: Internal Medicine

## 2019-06-23 ENCOUNTER — Ambulatory Visit (INDEPENDENT_AMBULATORY_CARE_PROVIDER_SITE_OTHER): Payer: Medicare Other | Admitting: Internal Medicine

## 2019-06-23 DIAGNOSIS — B20 Human immunodeficiency virus [HIV] disease: Secondary | ICD-10-CM | POA: Diagnosis not present

## 2019-06-23 DIAGNOSIS — F17200 Nicotine dependence, unspecified, uncomplicated: Secondary | ICD-10-CM | POA: Diagnosis not present

## 2019-06-23 DIAGNOSIS — K089 Disorder of teeth and supporting structures, unspecified: Secondary | ICD-10-CM | POA: Diagnosis not present

## 2019-06-23 NOTE — Assessment & Plan Note (Signed)
To talk to her again about the importance of cigarette cessation.  I suggested that she set a quit date in the near future and do her best to stop again.

## 2019-06-23 NOTE — Assessment & Plan Note (Signed)
Dental referral placed today for [facility]. Information to schedule appointment completed today.   

## 2019-06-23 NOTE — Assessment & Plan Note (Signed)
Her infection remains under excellent, long-term control.  She will continue Genvoya and follow-up after lab work in 1 year. 
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
## 2019-07-07 ENCOUNTER — Other Ambulatory Visit: Payer: Self-pay | Admitting: Internal Medicine

## 2019-07-07 DIAGNOSIS — E119 Type 2 diabetes mellitus without complications: Secondary | ICD-10-CM

## 2019-07-07 DIAGNOSIS — E118 Type 2 diabetes mellitus with unspecified complications: Secondary | ICD-10-CM

## 2019-07-07 MED ORDER — ACCU-CHEK GUIDE VI STRP: ORAL_STRIP | 1 refills | Status: DC

## 2019-07-07 NOTE — Telephone Encounter (Signed)
Refill Request   glucose blood (ACCU-CHEK GUIDE) test strip  [id number] - [place], [place] - [address] AT [address]

## 2019-07-10 ENCOUNTER — Other Ambulatory Visit: Payer: Self-pay | Admitting: Internal Medicine

## 2019-07-10 DIAGNOSIS — E119 Type 2 diabetes mellitus without complications: Secondary | ICD-10-CM

## 2019-07-16 ENCOUNTER — Other Ambulatory Visit: Payer: Self-pay | Admitting: Internal Medicine

## 2019-07-16 DIAGNOSIS — B20 Human immunodeficiency virus [HIV] disease: Secondary | ICD-10-CM

## 2019-08-13 ENCOUNTER — Encounter: Payer: Medicare Other | Admitting: Internal Medicine

## 2019-08-20 ENCOUNTER — Encounter: Payer: Self-pay | Admitting: Internal Medicine

## 2019-08-20 ENCOUNTER — Other Ambulatory Visit: Payer: Self-pay

## 2019-08-20 ENCOUNTER — Ambulatory Visit (INDEPENDENT_AMBULATORY_CARE_PROVIDER_SITE_OTHER): Payer: Medicare Other | Admitting: Internal Medicine

## 2019-08-20 VITALS — BP 126/76 | HR 80 | Temp 98.4°F | Ht 66.0 in | Wt 239.1 lb

## 2019-08-20 DIAGNOSIS — Z720 Tobacco use: Secondary | ICD-10-CM

## 2019-08-20 DIAGNOSIS — E119 Type 2 diabetes mellitus without complications: Secondary | ICD-10-CM

## 2019-08-20 DIAGNOSIS — Z7984 Long term (current) use of oral hypoglycemic drugs: Secondary | ICD-10-CM | POA: Diagnosis not present

## 2019-08-20 DIAGNOSIS — Z79899 Other long term (current) drug therapy: Secondary | ICD-10-CM

## 2019-08-20 DIAGNOSIS — Z0000 Encounter for general adult medical examination without abnormal findings: Secondary | ICD-10-CM

## 2019-08-20 DIAGNOSIS — I10 Essential (primary) hypertension: Secondary | ICD-10-CM

## 2019-08-20 DIAGNOSIS — Z23 Encounter for immunization: Secondary | ICD-10-CM | POA: Diagnosis not present

## 2019-08-20 LAB — POCT GLYCOSYLATED HEMOGLOBIN (HGB A1C): Hemoglobin A1C: 6.6 % — AB (ref 4.0–5.6)

## 2019-08-20 LAB — GLUCOSE, CAPILLARY: Glucose-Capillary: 93 mg/dL (ref 70–99)

## 2019-08-20 NOTE — Assessment & Plan Note (Signed)
HPI:  Currently on Farxiga monotherapy. She is tolerating it well without apparent side effects. She continues to modify her diet and avoid high carb foods. She exercises (walks) >30 minutes per day. She has had no signs or symptoms of hypoglycemia.   A/P: - Well controlled. Continue Wilder Glade  - Currently on Pravastatin  - Currently on Lisinopril

## 2019-08-20 NOTE — Patient Instructions (Signed)
Thank you for allowing me to provide your care. You are doing an excellent job with your health. Keep up the good work! Call around to some of the dentist offices in town to see if you can be seen.   I would like to see you back in 6 months or sooner if you have any issues.

## 2019-08-20 NOTE — Assessment & Plan Note (Addendum)
HPI:  Patient currently on lisinopril-HCTZ for HTN. She is tolerating these medications well without apparent side effects. Denies orthostatic symptoms. No issues affording the medications.   A/P: - Continue Lisinopril-HCTZ 20-12.5 mg QD - BMP checked on 06/04/2019 with stable renal function, sodium, and potassium
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
## 2019-08-21 NOTE — Progress Notes (Signed)
Internal Medicine Clinic Attending  Case discussed with Dr. Helberg at the time of the visit.  We reviewed the resident's history and exam and pertinent patient test results.  I agree with the assessment, diagnosis, and plan of care documented in the resident's note.    

## 2019-09-11 ENCOUNTER — Other Ambulatory Visit: Payer: Self-pay | Admitting: Internal Medicine

## 2019-10-16 ENCOUNTER — Other Ambulatory Visit: Payer: Self-pay | Admitting: Internal Medicine

## 2019-10-16 DIAGNOSIS — E119 Type 2 diabetes mellitus without complications: Secondary | ICD-10-CM

## 2019-10-16 NOTE — Telephone Encounter (Signed)
Need refill on diabetic medicine  ;pt contact [phone] [id number] [place],  - [place]

## 2019-10-19 ENCOUNTER — Other Ambulatory Visit: Payer: Self-pay | Admitting: Internal Medicine

## 2019-10-19 DIAGNOSIS — E876 Hypokalemia: Secondary | ICD-10-CM

## 2019-10-27 ENCOUNTER — Other Ambulatory Visit: Payer: Self-pay | Admitting: Internal Medicine

## 2019-10-27 DIAGNOSIS — E119 Type 2 diabetes mellitus without complications: Secondary | ICD-10-CM

## 2019-11-18 ENCOUNTER — Other Ambulatory Visit: Payer: Self-pay | Admitting: Internal Medicine

## 2019-11-18 DIAGNOSIS — E119 Type 2 diabetes mellitus without complications: Secondary | ICD-10-CM

## 2019-12-04 ENCOUNTER — Telehealth: Payer: Self-pay | Admitting: Internal Medicine

## 2019-12-04 NOTE — Telephone Encounter (Signed)
Pt is wanting to know if she needs to take the COVID vaccine; [phone]

## 2019-12-23 ENCOUNTER — Other Ambulatory Visit: Payer: Self-pay | Admitting: Internal Medicine

## 2020-01-11 ENCOUNTER — Other Ambulatory Visit: Payer: Self-pay | Admitting: Internal Medicine

## 2020-01-11 DIAGNOSIS — E876 Hypokalemia: Secondary | ICD-10-CM

## 2020-01-20 ENCOUNTER — Other Ambulatory Visit: Payer: Self-pay

## 2020-01-20 DIAGNOSIS — E876 Hypokalemia: Secondary | ICD-10-CM

## 2020-01-20 MED ORDER — LISINOPRIL-HYDROCHLOROTHIAZIDE 20-12.5 MG PO TABS: 1.0000 | ORAL_TABLET | Freq: Every day | ORAL | 1 refills | Status: DC

## 2020-01-20 MED ORDER — POTASSIUM CHLORIDE CRYS ER 20 MEQ PO TBCR: 20.0000 meq | EXTENDED_RELEASE_TABLET | Freq: Every day | ORAL | 0 refills | Status: DC

## 2020-01-20 NOTE — Telephone Encounter (Signed)
potassium chloride SA (K-DUR) 20 MEQ tablet   lisinopril-hydrochlorothiazide (ZESTORETIC) 20-12.5 MG tablet, REFILL REQUEST @  [id number] - [place], [place] - [address] [phone] (Phone) [phone] (Fax)

## 2020-02-18 ENCOUNTER — Encounter: Payer: Medicare Other | Admitting: Internal Medicine

## 2020-02-26 ENCOUNTER — Other Ambulatory Visit: Payer: Self-pay | Admitting: Internal Medicine

## 2020-02-26 DIAGNOSIS — B20 Human immunodeficiency virus [HIV] disease: Secondary | ICD-10-CM

## 2020-02-29 ENCOUNTER — Encounter: Payer: Self-pay | Admitting: *Deleted

## 2020-03-17 ENCOUNTER — Encounter: Payer: Self-pay | Admitting: *Deleted

## 2020-03-24 ENCOUNTER — Encounter: Payer: Medicare Other | Admitting: Internal Medicine

## 2020-04-07 ENCOUNTER — Other Ambulatory Visit: Payer: Self-pay | Admitting: Internal Medicine

## 2020-04-07 DIAGNOSIS — E119 Type 2 diabetes mellitus without complications: Secondary | ICD-10-CM

## 2020-04-07 NOTE — Telephone Encounter (Signed)
Next appt scheduled 6/24 with PCP. 

## 2020-04-14 ENCOUNTER — Other Ambulatory Visit: Payer: Self-pay | Admitting: Internal Medicine

## 2020-04-14 DIAGNOSIS — E118 Type 2 diabetes mellitus with unspecified complications: Secondary | ICD-10-CM

## 2020-04-14 DIAGNOSIS — E876 Hypokalemia: Secondary | ICD-10-CM

## 2020-05-05 ENCOUNTER — Other Ambulatory Visit: Payer: Self-pay

## 2020-05-05 ENCOUNTER — Ambulatory Visit (INDEPENDENT_AMBULATORY_CARE_PROVIDER_SITE_OTHER): Payer: Medicare Other | Admitting: Internal Medicine

## 2020-05-05 ENCOUNTER — Encounter: Payer: Self-pay | Admitting: Internal Medicine

## 2020-05-05 VITALS — BP 141/77 | HR 58 | Temp 98.1°F | Ht 66.0 in | Wt 250.7 lb

## 2020-05-05 DIAGNOSIS — I10 Essential (primary) hypertension: Secondary | ICD-10-CM | POA: Diagnosis not present

## 2020-05-05 DIAGNOSIS — E119 Type 2 diabetes mellitus without complications: Secondary | ICD-10-CM | POA: Diagnosis not present

## 2020-05-05 DIAGNOSIS — E785 Hyperlipidemia, unspecified: Secondary | ICD-10-CM

## 2020-05-05 DIAGNOSIS — R600 Localized edema: Secondary | ICD-10-CM | POA: Diagnosis not present

## 2020-05-05 LAB — POCT GLYCOSYLATED HEMOGLOBIN (HGB A1C): Hemoglobin A1C: 6.3 % — AB (ref 4.0–5.6)

## 2020-05-05 LAB — GLUCOSE, CAPILLARY: Glucose-Capillary: 131 mg/dL — ABNORMAL HIGH (ref 70–99)

## 2020-05-05 MED ORDER — PRAVASTATIN SODIUM 40 MG PO TABS: ORAL_TABLET | ORAL | 1 refills | Status: DC

## 2020-05-05 MED ORDER — LISINOPRIL-HYDROCHLOROTHIAZIDE 20-12.5 MG PO TABS: 1.0000 | ORAL_TABLET | Freq: Every day | ORAL | 1 refills | Status: DC
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
## 2020-05-05 NOTE — Assessment & Plan Note (Signed)
Refill the patient's pravastatin 40 mg once daily.

## 2020-05-05 NOTE — Patient Instructions (Signed)
Thank you for allowing me to provide your care the last 3 years. Today we are starting you on a medication called pravastatin. This is to protect your blood vessels and heart. Please take it once daily at bedtime.   Continue to elevate your legs 2-3 times per day. If your swelling gets worse we will do some additional blood work and check your heart.   Please come back in 3 months or sooner if any issues arise.

## 2020-05-05 NOTE — Assessment & Plan Note (Signed)
Patient presents with bilateral lower extremity edema. Worse on the right. She states that is been going on for a couple days. Seems to be worse at the end of a long workday. Improves in the morning with elevation of her feet. She has had no orthopnea, but not now, exertional dyspnea, shortness of breath, chest pain, change in urine output, abdominal pain.  This is likely venous stasis. I recommended that she elevate her legs at least 2 to 3 times per day. We will check a urine analysis to make sure that she does not have significant proteinuria. If her swelling does not improve will need to check hepatic function panel and consider imaging of her heart.

## 2020-05-05 NOTE — Assessment & Plan Note (Signed)
Patient with well-controlled diabetes. She is on Farxiga 5 mg once daily. She has had no apparent side effects including UTIs. She is tolerating the medication well. She checks her blood sugars a couple times a day. Her glucose monitor was reviewed today. She is tested 20 times since her last visit. Her average is 142. She has decreased her walking regimen. She has gained 12 pound since her last visit.  Repeat A1c today at 6.3.  She remains well controlled on Farxiga 5 mg QD. We will continue this for now. Encouraged her to continue walking. She is currently on lisinopril and pravastatin.

## 2020-05-05 NOTE — Assessment & Plan Note (Signed)
Patient is currently on lisinopril 20 mg and hydrochlorothiazide 12.5 mg daily. She has been taking these medications without any complaints or apparent side effects. She is previously needed to be on potassium supplementation due to the hydrochlorothiazide. She checks her blood pressure every morning and states that her systolic is typically between 527 and 130. Today it was elevated at 140 but this is an outlier. Since her last visit she has gained 12 pounds.  Plan will be to continue her lisinopril 20 mg and hydrochlorothiazide 12.5 mg daily. We will recheck a BMP today and supplement her potassium as needed. We discussed continuing to exercise and work towards weight loss. If her blood pressure is elevated at the next visit we can consider increasing her current regimen versus adding on amlodipine.

## 2020-05-06 LAB — BMP8+ANION GAP
Anion Gap: 15 mmol/L (ref 10.0–18.0)
BUN/Creatinine Ratio: 19 (ref 12–28)
BUN: 11 mg/dL (ref 8–27)
CO2: 23 mmol/L (ref 20–29)
Calcium: 9.6 mg/dL (ref 8.7–10.3)
Chloride: 103 mmol/L (ref 96–106)
Creatinine, Ser: 0.57 mg/dL (ref 0.57–1.00)
GFR calc Af Amer: 117 mL/min/{1.73_m2} (ref >59)
GFR calc non Af Amer: 101 mL/min/{1.73_m2} (ref >59)
Glucose: 131 mg/dL — ABNORMAL HIGH (ref 65–99)
Potassium: 4 mmol/L (ref 3.5–5.2)
Sodium: 141 mmol/L (ref 134–144)

## 2020-05-06 LAB — URINALYSIS, ROUTINE W REFLEX MICROSCOPIC
Bilirubin, UA: NEGATIVE
Ketones, UA: NEGATIVE
Leukocytes,UA: NEGATIVE
Nitrite, UA: NEGATIVE
Protein,UA: NEGATIVE
RBC, UA: NEGATIVE
Specific Gravity, UA: >=1.03 — AB (ref 1.005–1.030)
Urobilinogen, Ur: 0.2 mg/dL (ref 0.2–1.0)
pH, UA: 5.5 (ref 5.0–7.5)

## 2020-05-06 NOTE — Progress Notes (Signed)
Internal Medicine Clinic Attending  Case discussed with Dr. Helberg at the time of the visit.  We reviewed the resident's history and exam and pertinent patient test results.  I agree with the assessment, diagnosis, and plan of care documented in the resident's note.  Tynesha Free, M.D., Ph.D.  

## 2020-06-22 ENCOUNTER — Other Ambulatory Visit: Payer: Medicare Other

## 2020-07-06 ENCOUNTER — Encounter: Payer: Medicare Other | Admitting: Internal Medicine

## 2020-07-20 ENCOUNTER — Other Ambulatory Visit: Payer: Self-pay | Admitting: Internal Medicine

## 2020-07-20 ENCOUNTER — Other Ambulatory Visit: Payer: Self-pay | Admitting: Student

## 2020-07-20 ENCOUNTER — Other Ambulatory Visit: Payer: Self-pay | Admitting: *Deleted

## 2020-07-20 DIAGNOSIS — I10 Essential (primary) hypertension: Secondary | ICD-10-CM

## 2020-07-20 DIAGNOSIS — E119 Type 2 diabetes mellitus without complications: Secondary | ICD-10-CM

## 2020-07-20 DIAGNOSIS — E876 Hypokalemia: Secondary | ICD-10-CM

## 2020-07-20 MED ORDER — DAPAGLIFLOZIN PROPANEDIOL 5 MG PO TABS: 5.0000 mg | ORAL_TABLET | Freq: Every day | ORAL | 0 refills | Status: DC

## 2020-07-20 NOTE — Telephone Encounter (Signed)
Need refill on FARXIGA 5 MG TABS tablet  ;pt contact [phone]    [id number] [place], [place] - [address]

## 2020-07-21 ENCOUNTER — Other Ambulatory Visit: Payer: Self-pay

## 2020-07-21 DIAGNOSIS — E876 Hypokalemia: Secondary | ICD-10-CM

## 2020-08-04 ENCOUNTER — Other Ambulatory Visit: Payer: Self-pay | Admitting: Internal Medicine

## 2020-08-04 DIAGNOSIS — B20 Human immunodeficiency virus [HIV] disease: Secondary | ICD-10-CM

## 2020-08-29 ENCOUNTER — Other Ambulatory Visit: Payer: Self-pay | Admitting: Internal Medicine

## 2020-08-29 DIAGNOSIS — E119 Type 2 diabetes mellitus without complications: Secondary | ICD-10-CM

## 2020-09-26 ENCOUNTER — Encounter: Payer: Self-pay | Admitting: Student

## 2020-09-26 ENCOUNTER — Ambulatory Visit (INDEPENDENT_AMBULATORY_CARE_PROVIDER_SITE_OTHER): Payer: Medicare Other | Admitting: Student

## 2020-09-26 VITALS — BP 141/79 | HR 64 | Temp 98.4°F | Wt 259.6 lb

## 2020-09-26 DIAGNOSIS — Z23 Encounter for immunization: Secondary | ICD-10-CM

## 2020-09-26 DIAGNOSIS — F17200 Nicotine dependence, unspecified, uncomplicated: Secondary | ICD-10-CM | POA: Diagnosis not present

## 2020-09-26 DIAGNOSIS — I10 Essential (primary) hypertension: Secondary | ICD-10-CM | POA: Diagnosis not present

## 2020-09-26 DIAGNOSIS — B20 Human immunodeficiency virus [HIV] disease: Secondary | ICD-10-CM | POA: Diagnosis not present

## 2020-09-26 DIAGNOSIS — Z0000 Encounter for general adult medical examination without abnormal findings: Secondary | ICD-10-CM

## 2020-09-26 DIAGNOSIS — E119 Type 2 diabetes mellitus without complications: Secondary | ICD-10-CM

## 2020-09-26 DIAGNOSIS — R600 Localized edema: Secondary | ICD-10-CM | POA: Diagnosis not present

## 2020-09-26 DIAGNOSIS — Z1231 Encounter for screening mammogram for malignant neoplasm of breast: Secondary | ICD-10-CM

## 2020-09-26 DIAGNOSIS — E876 Hypokalemia: Secondary | ICD-10-CM

## 2020-09-26 DIAGNOSIS — R053 Chronic cough: Secondary | ICD-10-CM | POA: Diagnosis not present

## 2020-09-26 LAB — POCT GLYCOSYLATED HEMOGLOBIN (HGB A1C): Hemoglobin A1C: 7.4 % — AB (ref 4.0–5.6)

## 2020-09-26 LAB — GLUCOSE, CAPILLARY: Glucose-Capillary: 164 mg/dL — ABNORMAL HIGH (ref 70–99)

## 2020-09-26 MED ORDER — OMEPRAZOLE 10 MG PO CPDR: 10.0000 mg | DELAYED_RELEASE_CAPSULE | Freq: Every day | ORAL | 1 refills | Status: DC

## 2020-09-26 MED ORDER — POTASSIUM CHLORIDE CRYS ER 20 MEQ PO TBCR: 20.0000 meq | EXTENDED_RELEASE_TABLET | Freq: Every day | ORAL | 0 refills | Status: DC

## 2020-09-26 MED ORDER — ACCU-CHEK GUIDE VI STRP: ORAL_STRIP | 1 refills | Status: DC

## 2020-09-26 MED ORDER — DAPAGLIFLOZIN PROPANEDIOL 10 MG PO TABS: 10.0000 mg | ORAL_TABLET | Freq: Every day | ORAL | 3 refills | Status: DC

## 2020-09-26 MED ORDER — NICOTINE POLACRILEX 2 MG MT GUM: 2.0000 mg | CHEWING_GUM | OROMUCOSAL | 0 refills | Status: DC | PRN

## 2020-09-26 MED ORDER — BUPROPION HCL ER (SR) 150 MG PO TB12: 150.0000 mg | ORAL_TABLET | Freq: Every day | ORAL | 1 refills | Status: DC

## 2020-09-26 NOTE — Assessment & Plan Note (Addendum)
Assessment: Patient smokes a pack of cigarettes every 4 days. She notes she has been doing this for 40-60 years. She would like to quit by the new year. She notes being prescribed a medication in the past to help quit smoking but it was too expensive and not covered by her insurance.      Plan: -Starting on bupropion 150 mg daily. Will see how patient tolerates in 4 weeks and increase dose as needed -Denies history of seizures -If not covered by insurance, will look into alternative medication options. -Nicotine gum ordered -Patient currently has a 13 pack year history. Does not qualify for annual low dose CT -Reassess in 4 weeks

## 2020-09-26 NOTE — Assessment & Plan Note (Addendum)
Assessment: Patient endorses dry cough for the past 8 weeks. She notes it is worse at night when lying in bed. Does happen sometimes in the daytime, but occurs mostly at night. Denies coughing up bloody sputum. Denies episodes of shortness of breath. Denies recent illness. Patient does endorse bitter taste in mouth at time and states she has had reflux in the past. I suspect this may be her reflux that has been untreated. With her smoking history, will also order a chest x-ray to rule out other pathologies. Do not believe lisinopril is causing cough, she has been on the medication since 2019 without complications.   Patient denies orthopnea or feeling short of breath.   Plan: Start omeprazole 10 mg daily 2 view chest x-ray ordered Reassess in one month

## 2020-09-26 NOTE — Assessment & Plan Note (Signed)
Assessment: BP Today:  Current regimen of lisinopril 20 mg, HCTZ 12.5. Past prior visits patient's BP has been well controlled and at all. She denies any lightheaded or dizziness, no apparent side effects.  Plan: Continue current regimen. Increase dosage if continues to be increased at next visit. Instructed patient to take BP's at home twice daily

## 2020-09-26 NOTE — Assessment & Plan Note (Addendum)
Assessment: Trace to 1+  lower extremity edema bilaterally. Prior PCP thought his was due to venous stasis. Patient with 2+ posterior tibialis pulse bilaterally. Denies shortnress of breath, does sleep on two pillows but has done this as long as she can remember. No tenderness, warmth, or redness to either lower extremity. Denies elevating feet at night or using compression stockings.  Plan: Instructed patient to elevate lower extremities in the evening  If continues to be elevated, consider echo, hepatic function panel.  Negative protein on prior urinalysis
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
## 2020-09-26 NOTE — Assessment & Plan Note (Signed)
Assessment: Lab Results  Component Value Date   HGBA1C 7.4 (A) 09/26/2020   HGBA1c elevated today from prior of 6.3. Avg morning glucose readings of 159. High of 225 and low of 137. Current regimen of farxiga 5 mg daily. Unable to tolerate metformin in the past. She states she eats vegetables, fruits, lean meats and has 2-3 zero sugar sodas a day.   Because of her increase in A1c, will increase farxiga to 10 mg daily  Plan: Continue daily glucose checks Increase farxiga to 10 mg daily If patient's having episodes of hypoglycemia, will decrease dose back to 5 mg Goal is HGBA1c under 7.

## 2020-09-26 NOTE — Patient Instructions (Addendum)
Thank you, Ms.[name] for allowing [place] to provide your care today. Today we discussed:  Leg Swelling - Elevate your legs in the evening and wear compression stockings!  Smoking - Congratulations on wanting to quit! We will be starting you on a medication called bupropion and I added nicotine gum as well! If this isn't covered by your insurance please call our office.   High Blood Pressure - Please purchase a blood pressure cuff and take your pressures at home and record them  Diabetes  - your A1c was elevated. We will be increasing your farxiga from 5 to 10 mg. If you have episodes where your sugars are too low and you feel dizzy, weak, please call our office  Cough - It seems as though acid reflux may be contributing to your cough. We will be starting you on a medication for this. We will also be getting an image of your chest (xray or CT scan) because of your history of smoking.   I have ordered the following labs for you:   Lab Orders     Glucose, capillary     POC Hbg A1C   Tests ordered today:  Mammogram  Referrals ordered today:    Referral Orders     Ambulatory referral to Ophthalmology   I have ordered the following medication/changed the following medications:   Stop the following medications: Medications Discontinued During This Encounter  Medication Reason  . FARXIGA 5 MG TABS tablet   . glucose blood (ACCU-CHEK GUIDE) test strip Reorder  . KLOR-CON M20 20 MEQ tablet Reorder     Start the following medications: Meds ordered this encounter  Medications  . glucose blood (ACCU-CHEK GUIDE) test strip    Sig: Check blood sugar one time daily    Dispense:  100 each    Refill:  1    The patient is insulin requiring, ICD 10 code E11.9. The patient tests 1 times per day.  . potassium chloride SA (KLOR-CON M20) 20 MEQ tablet    Sig: Take 1 tablet (20 mEq total) by mouth daily.    Dispense:  90 tablet    Refill:  0  . buPROPion (WELLBUTRIN SR) 150 MG 12 hr  tablet    Sig: Take 1 tablet (150 mg total) by mouth daily.    Dispense:  30 tablet    Refill:  1  . nicotine polacrilex (NICORETTE) 2 MG gum    Sig: Take 1 each (2 mg total) by mouth as needed for smoking cessation.    Dispense:  100 tablet    Refill:  0  . dapagliflozin propanediol (FARXIGA) 10 MG TABS tablet    Sig: Take 1 tablet (10 mg total) by mouth daily before breakfast.    Dispense:  30 tablet    Refill:  3  . omeprazole (PRILOSEC) 10 MG capsule    Sig: Take 1 capsule (10 mg total) by mouth daily.    Dispense:  30 capsule    Refill:  1     Follow up: 1 month    Remember: If you need help with quitting smoking, please call our office!  Should you have any questions or concerns please call the internal medicine clinic at [phone].     Thalia Bloodgood, D.O. [facility]

## 2020-10-08 NOTE — Progress Notes (Signed)
Internal Medicine Clinic Attending  I saw and evaluated the patient.  I personally confirmed the key portions of the history and exam documented by Dr. Katsadouros and I reviewed pertinent patient test results.  The assessment, diagnosis, and plan were formulated together and I agree with the documentation in the resident's note.  Marvene Strohm, M.D., Ph.D.  

## 2020-10-12 ENCOUNTER — Other Ambulatory Visit: Payer: Self-pay | Admitting: Internal Medicine

## 2020-10-12 DIAGNOSIS — B20 Human immunodeficiency virus [HIV] disease: Secondary | ICD-10-CM

## 2020-10-24 ENCOUNTER — Encounter: Payer: Self-pay | Admitting: Student

## 2020-10-24 ENCOUNTER — Ambulatory Visit (INDEPENDENT_AMBULATORY_CARE_PROVIDER_SITE_OTHER): Payer: Medicare Other | Admitting: Student

## 2020-10-24 ENCOUNTER — Other Ambulatory Visit: Payer: Self-pay

## 2020-10-24 ENCOUNTER — Ambulatory Visit (HOSPITAL_COMMUNITY)
Admission: RE | Admit: 2020-10-24 | Discharge: 2020-10-24 | Disposition: A | Discharge status: Home / Self Care | Payer: Medicare Other | Source: Ambulatory Visit | Attending: Internal Medicine | Admitting: Internal Medicine

## 2020-10-24 VITALS — BP 125/75 | HR 65 | Temp 98.1°F | Ht 66.0 in | Wt 260.0 lb

## 2020-10-24 DIAGNOSIS — I10 Essential (primary) hypertension: Secondary | ICD-10-CM

## 2020-10-24 DIAGNOSIS — E119 Type 2 diabetes mellitus without complications: Secondary | ICD-10-CM | POA: Diagnosis not present

## 2020-10-24 DIAGNOSIS — F17200 Nicotine dependence, unspecified, uncomplicated: Secondary | ICD-10-CM

## 2020-10-24 DIAGNOSIS — R053 Chronic cough: Secondary | ICD-10-CM | POA: Diagnosis not present

## 2020-10-24 DIAGNOSIS — F17210 Nicotine dependence, cigarettes, uncomplicated: Secondary | ICD-10-CM

## 2020-10-24 DIAGNOSIS — R059 Cough, unspecified: Secondary | ICD-10-CM | POA: Diagnosis not present

## 2020-10-24 MED ORDER — LOSARTAN POTASSIUM-HCTZ 50-12.5 MG PO TABS: 1.0000 | ORAL_TABLET | Freq: Every day | ORAL | 11 refills | Status: DC

## 2020-10-24 MED ORDER — LOSARTAN POTASSIUM-HCTZ 50-12.5 MG PO TABS: 1.0000 | ORAL_TABLET | Freq: Every day | ORAL | 0 refills | Status: DC

## 2020-10-24 NOTE — Patient Instructions (Addendum)
Ms. Burklow,  It was a pleasure seeing you today!  Today we discussed your cough that you've been having. I am glad it has improved! We would like for your to switch blood pressure medications as lisinopril can cause a cough. In addition, we would still like you to get the chest x-ray.   STOP taking: lisinopril-hydrochlorothiazide (ZESTORETIC)  START taking: losartan-hydrochlorothiazide 50-12.5mg  (HYZAAR), once daily.  Your blood sugars are still a little higher than we would like. Please make sure to continue taking your medications and checking your sugars daily. It is important to eat well so that your sugars tdon't get too high!  I am so happy you have been able to decrease the amount of cigarettes you use! Keep taking the Wellbutrin and we will check in on you in one month.  We look forward to seeing you next time. Please call our clinic at [phone] if you have any questions or concerns. The best time to call is Monday-Friday from 9am-4pm, but there is someone available 24/7 at the same number. If you need medication refills, please notify your pharmacy one week in advance and they will send [place] a request.  Thank you for letting us take part in your care. Wishing you the best!  Thank you, Dr. Evlyn Kanner, MD

## 2020-10-24 NOTE — Assessment & Plan Note (Signed)
During last visit, Farxiga increased to 10mg  per day. Patient states she has tolerated the medication change well. Denies any episodes of hypoglycemia, saying the lowest reading she has seen is 90. Denies urinary symptoms. She did bring her glucometer with her today. Average glucose 152.  A/P: Patient is tolerating medication well, glucose near goal. Will continue with current regimen and continue to encourage healthy diet. - Continue Farxiga 10mg  daily
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
## 2020-10-24 NOTE — Assessment & Plan Note (Signed)
Patient started on Wellbutrin during last visit. She states she is doing well with the medication, now down to only one cigarette per day. Congratulated Nicole [name] and encouraged her to continue taking the medication and weaning down use of cigarettes.  - Wellbutrin 150mg  daily - Nicotine gum PRN

## 2020-10-24 NOTE — Progress Notes (Signed)
Internal Medicine Clinic Attending  I saw and evaluated the patient.  I personally confirmed the key portions of the history and exam documented by Dr. Braswell and I reviewed pertinent patient test results.  The assessment, diagnosis, and plan were formulated together and I agree with the documentation in the resident's note.  

## 2020-10-24 NOTE — Assessment & Plan Note (Addendum)
Patient states her cough has improved since she started taking omeprazole. Mentions the cough does not bother her as much as it previously did. She did not previously get the chest x-ray, as she didn't know if she still needed to get it. Denies fevers, chest pain, dyspnea, rhinorrhea, post-nasal drip, hemoptysis, weight changes, recent travel.  A/P: Unclear if etiology is GERD, although patient endorsing improvement of symptoms with omeprazole. Given she is on lisinopril with this chronic cough, will switch to Hyzaar. Encouraged patient to get chest x-ray given her smoking history. - Continue omeprazole 10mg  daily - Switch from Zestoretic (lisinopril-HCTZ) to Hyzaar (losartan-HCTZ) 50-12.5mg  daily - CXR today - Will re-check lab work and blood pressure in 1 month given change in medication

## 2020-11-08 ENCOUNTER — Other Ambulatory Visit: Payer: Self-pay

## 2020-11-08 DIAGNOSIS — B20 Human immunodeficiency virus [HIV] disease: Secondary | ICD-10-CM

## 2020-11-08 NOTE — Telephone Encounter (Signed)
Pt is requesting her GENVOYA 150-150-200-10 MG TABS tablet to be sent to  [id number] - [place], [place] - [address] Phone:  [phone]  Fax:  [phone]     ( Pt stated that all her other medication was sent in but not that one and she has none for today 11/08/20)

## 2020-11-09 ENCOUNTER — Other Ambulatory Visit: Payer: Self-pay

## 2020-11-09 DIAGNOSIS — B20 Human immunodeficiency virus [HIV] disease: Secondary | ICD-10-CM

## 2020-11-09 MED ORDER — GENVOYA 150-150-200-10 MG PO TABS: ORAL_TABLET | ORAL | 0 refills | Status: DC

## 2020-11-14 ENCOUNTER — Other Ambulatory Visit: Payer: Medicare Other

## 2020-11-14 ENCOUNTER — Other Ambulatory Visit: Payer: Self-pay

## 2020-11-14 DIAGNOSIS — Z79899 Other long term (current) drug therapy: Secondary | ICD-10-CM

## 2020-11-14 DIAGNOSIS — B20 Human immunodeficiency virus [HIV] disease: Secondary | ICD-10-CM | POA: Diagnosis not present

## 2020-11-14 DIAGNOSIS — Z113 Encounter for screening for infections with a predominantly sexual mode of transmission: Secondary | ICD-10-CM

## 2020-11-15 LAB — T-HELPER CELL (CD4) - (RCID CLINIC ONLY)
CD4 % Helper T Cell: 49 % (ref 33–65)
CD4 T Cell Abs: 1328 /uL (ref 400–1790)

## 2020-11-23 LAB — CBC WITH DIFFERENTIAL/PLATELET
Absolute Monocytes: 749 cells/uL (ref 200–950)
Basophils Absolute: 48 cells/uL (ref 0–200)
Basophils Relative: 0.5 %
Eosinophils Absolute: 221 cells/uL (ref 15–500)
Eosinophils Relative: 2.3 %
HCT: 43.5 % (ref 35.0–45.0)
Hemoglobin: 15 g/dL (ref 11.7–15.5)
Lymphs Abs: 2928 cells/uL (ref 850–3900)
MCH: 31.3 pg (ref 27.0–33.0)
MCHC: 34.5 g/dL (ref 32.0–36.0)
MCV: 90.8 fL (ref 80.0–100.0)
MPV: 11.8 fL (ref 7.5–12.5)
Monocytes Relative: 7.8 %
Neutro Abs: 5654 cells/uL (ref 1500–7800)
Neutrophils Relative %: 58.9 %
Platelets: 229 10*3/uL (ref 140–400)
RBC: 4.79 10*6/uL (ref 3.80–5.10)
RDW: 12.3 % (ref 11.0–15.0)
Total Lymphocyte: 30.5 %
WBC: 9.6 10*3/uL (ref 3.8–10.8)

## 2020-11-23 LAB — COMPLETE METABOLIC PANEL WITH GFR
AG Ratio: 1.3 (calc) (ref 1.0–2.5)
ALT: 17 U/L (ref 6–29)
AST: 18 U/L (ref 10–35)
Albumin: 4.2 g/dL (ref 3.6–5.1)
Alkaline phosphatase (APISO): 62 U/L (ref 37–153)
BUN: 12 mg/dL (ref 7–25)
CO2: 27 mmol/L (ref 20–32)
Calcium: 9.6 mg/dL (ref 8.6–10.4)
Chloride: 104 mmol/L (ref 98–110)
Creat: 0.65 mg/dL (ref 0.50–0.99)
GFR, Est African American: 111 mL/min/{1.73_m2} (ref >=60)
GFR, Est Non African American: 96 mL/min/{1.73_m2} (ref >=60)
Globulin: 3.3 g/dL (calc) (ref 1.9–3.7)
Glucose, Bld: 125 mg/dL — ABNORMAL HIGH (ref 65–99)
Potassium: 4 mmol/L (ref 3.5–5.3)
Sodium: 140 mmol/L (ref 135–146)
Total Bilirubin: 0.5 mg/dL (ref 0.2–1.2)
Total Protein: 7.5 g/dL (ref 6.1–8.1)

## 2020-11-23 LAB — HIV-1 RNA QUANT-NO REFLEX-BLD
HIV 1 RNA Quant: <20 Copies/mL
HIV-1 RNA Quant, Log: <1.3 Log cps/mL

## 2020-11-23 LAB — LIPID PANEL
Cholesterol: 189 mg/dL (ref <200)
HDL: 64 mg/dL (ref >=50)
LDL Cholesterol (Calc): 103 mg/dL (calc) — ABNORMAL HIGH
Non-HDL Cholesterol (Calc): 125 mg/dL (calc) (ref <130)
Total CHOL/HDL Ratio: 3 (calc) (ref <5.0)
Triglycerides: 121 mg/dL (ref <150)

## 2020-11-23 LAB — RPR: RPR Ser Ql: NONREACTIVE

## 2020-11-24 ENCOUNTER — Other Ambulatory Visit: Payer: Self-pay | Admitting: Student

## 2020-11-24 DIAGNOSIS — R053 Chronic cough: Secondary | ICD-10-CM

## 2020-11-24 DIAGNOSIS — F17200 Nicotine dependence, unspecified, uncomplicated: Secondary | ICD-10-CM

## 2020-11-29 ENCOUNTER — Other Ambulatory Visit: Payer: Self-pay

## 2020-11-29 DIAGNOSIS — B20 Human immunodeficiency virus [HIV] disease: Secondary | ICD-10-CM

## 2020-11-29 MED ORDER — GENVOYA 150-150-200-10 MG PO TABS: ORAL_TABLET | ORAL | 3 refills | Status: DC

## 2020-11-30 ENCOUNTER — Encounter: Payer: Self-pay | Admitting: Internal Medicine

## 2020-11-30 ENCOUNTER — Ambulatory Visit (INDEPENDENT_AMBULATORY_CARE_PROVIDER_SITE_OTHER): Payer: Medicare Other | Admitting: Internal Medicine

## 2020-11-30 ENCOUNTER — Other Ambulatory Visit: Payer: Self-pay

## 2020-11-30 ENCOUNTER — Encounter: Payer: Medicare Other | Admitting: Internal Medicine

## 2020-11-30 VITALS — BP 124/75 | HR 82 | Temp 98.8°F | Wt 263.0 lb

## 2020-11-30 DIAGNOSIS — E119 Type 2 diabetes mellitus without complications: Secondary | ICD-10-CM

## 2020-11-30 DIAGNOSIS — Z8639 Personal history of other endocrine, nutritional and metabolic disease: Secondary | ICD-10-CM | POA: Diagnosis not present

## 2020-11-30 DIAGNOSIS — G629 Polyneuropathy, unspecified: Secondary | ICD-10-CM | POA: Diagnosis not present

## 2020-11-30 DIAGNOSIS — B20 Human immunodeficiency virus [HIV] disease: Secondary | ICD-10-CM | POA: Diagnosis not present

## 2020-11-30 DIAGNOSIS — M1611 Unilateral primary osteoarthritis, right hip: Secondary | ICD-10-CM | POA: Diagnosis not present

## 2020-11-30 DIAGNOSIS — I10 Essential (primary) hypertension: Secondary | ICD-10-CM | POA: Diagnosis not present

## 2020-11-30 MED ORDER — GENVOYA 150-150-200-10 MG PO TABS: ORAL_TABLET | ORAL | 11 refills | Status: DC
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
## 2020-11-30 NOTE — Patient Instructions (Addendum)
Ms Nicole [name],  It was a pleasure seeing you in clinic. Today we discussed:   Blood pressure:  Your BP looks great today. Please continue taking your losartan-HCTZ daily as prescribed.  Diabetes: Continue taking your Marcelline Deist as prescribed. As we discussed, I recommend incorporating daily 20-30 minute walking into your routine. Follow up in 1 month to check on your A1c.   Neuropathy: I am checking on lab work at this time to evaluate for the cause of your symptoms. I will call you with any abnormal results.   If you have any questions or concerns, please call our clinic at [phone] between 9am-5pm and after hours call [phone] and ask for the internal medicine resident on call. If you feel you are having a medical emergency please call 911.   Thank you, we look forward to helping you remain healthy!

## 2020-11-30 NOTE — Assessment & Plan Note (Signed)
Her infection remains under excellent, long-term control.  She will continue GYN follow-up after lab work in 1 year.
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
## 2020-12-01 LAB — BMP8+ANION GAP
Anion Gap: 16 mmol/L (ref 10.0–18.0)
BUN/Creatinine Ratio: 23 (ref 12–28)
BUN: 15 mg/dL (ref 8–27)
CO2: 21 mmol/L (ref 20–29)
Calcium: 9.6 mg/dL (ref 8.7–10.3)
Chloride: 103 mmol/L (ref 96–106)
Creatinine, Ser: 0.66 mg/dL (ref 0.57–1.00)
GFR calc Af Amer: 110 mL/min/{1.73_m2} (ref >59)
GFR calc non Af Amer: 96 mL/min/{1.73_m2} (ref >59)
Glucose: 129 mg/dL — ABNORMAL HIGH (ref 65–99)
Potassium: 4.1 mmol/L (ref 3.5–5.2)
Sodium: 140 mmol/L (ref 134–144)

## 2020-12-01 LAB — TSH: TSH: 0.784 u[IU]/mL (ref 0.450–4.500)

## 2020-12-01 LAB — MICROALBUMIN / CREATININE URINE RATIO
Creatinine, Urine: 30.1 mg/dL
Microalb/Creat Ratio: 96 mg/g creat — ABNORMAL HIGH (ref 0–29)
Microalbumin, Urine: 28.8 ug/mL

## 2020-12-01 LAB — T4, FREE: Free T4: 1.32 ng/dL (ref 0.82–1.77)

## 2020-12-02 DIAGNOSIS — G629 Polyneuropathy, unspecified: Secondary | ICD-10-CM | POA: Insufficient documentation

## 2020-12-02 MED ORDER — GABAPENTIN 300 MG PO CAPS: 300.0000 mg | ORAL_CAPSULE | Freq: Every day | ORAL | 3 refills | Status: DC

## 2020-12-02 NOTE — Assessment & Plan Note (Addendum)
BP Readings from Last 3 Encounters:  11/30/20 124/75  11/30/20 137/85  10/24/20 125/75   Patient is presenting today for follow up of her hypertension. She was evaluated for this in clinic on 09/26/2020 and noted to have poorly controlled BP for which her antihypertensive regimen (lisinopril-HCTZ was increased to twice daily). Patient did achieve improved blood pressure control at her follow up visit on 10/24/2020, however endorsed worsening cough for which she was switched to losartan-HCTZ 50-12.5mg  daily. She has been taking this regularly as prescribed. She denies any headaches, vision changes, lightheadedness/dizziness or focal deficits.   Plan: Continue losartan-HCTZ 50-12.5mg  daily  F/u in 3 months for BP check

## 2020-12-02 NOTE — Assessment & Plan Note (Signed)
Patient has history of osteoarthritis s/p bilateral total hip replacement with most recent one being the right in 2019. Patient continues to require use of cane to assist with ambulation. Denies any ongoing pain at this time. She is requesting a handicap placcard  Plan: Form for handicap placcard signed and given to patient

## 2020-12-02 NOTE — Assessment & Plan Note (Signed)
Patient's most recent A1c 7.4 in November. She is on [place] 10mg  daily and tolerating well. She denies any episodes of hypoglycemia at this time.  Urine microalbumin-creatinine ratio elevated to 96 with normal GFR. Patient is currently on ARB for antihypertensive treatment.   Plan: Continue Farxiga 10mg  daily Continue losartan-HCTZ as above for HTN

## 2020-12-02 NOTE — Assessment & Plan Note (Addendum)
Patient is presenting with few weeks of neuropathic symptoms in bilateral hands and feet. She endorses that this is constant in nature and denies any alleviating or aggravating factors. She has not had any new medication changes. She does have a long-standing history of HIV for which she is on Genvoya (current viral load undetectable) and diabetes (current HbA1c <6) which could be contributing to this. However, does have a prior TSH in 2018 that was low, suggestive of possible hyperthyroidism. Repeat TSH and free T4 at this visit wnl. Suspect that her neuropathy is secondary to chronic HIV and diabetes.   Plan: Start gabapentin 300mg  qHS, titrate up as necessary  F/u in 1 month

## 2020-12-06 NOTE — Progress Notes (Signed)
Internal Medicine Clinic Attending ° °Case discussed with Dr. Aslam  At the time of the visit.  We reviewed the resident’s history and exam and pertinent patient test results.  I agree with the assessment, diagnosis, and plan of care documented in the resident’s note.  °

## 2020-12-08 ENCOUNTER — Other Ambulatory Visit: Payer: Self-pay | Admitting: *Deleted

## 2020-12-08 DIAGNOSIS — E119 Type 2 diabetes mellitus without complications: Secondary | ICD-10-CM

## 2020-12-08 MED ORDER — ACCU-CHEK GUIDE VI STRP
ORAL_STRIP | 1 refills | Status: DC
Start: 2020-12-08 — End: 2021-05-22

## 2020-12-08 NOTE — Telephone Encounter (Signed)
[facility] by Dr Einar Crow ,for accu-chek guide test strips, is not authorized to write scripts for medicare part B. I will Attending to re-send rx.

## 2020-12-14 ENCOUNTER — Encounter: Payer: Self-pay | Admitting: *Deleted

## 2020-12-14 NOTE — Progress Notes (Signed)

## 2020-12-15 NOTE — Progress Notes (Signed)
Things That May Be Affecting Your Health:  Alcohol  Hearing loss X Pain    Depression  Home Safety  Sexual Health   Diabetes X Lack of physical activity  Stress   Difficulty with daily activities  Loneliness  Tiredness   Drug use  Medicines X Tobacco use   Falls  Motor Vehicle Safety X Weight   Food choices  Oral Health  Other    YOUR PERSONALIZED HEALTH PLAN : 1. Schedule your next subsequent Medicare Wellness visit in one year 2. Attend all of your regular appointments to address your medical issues 3. Complete the preventative screenings and services   Annual Wellness Visit   Medicare Covered Preventative Screenings and Services  Services & Screenings Men and Women Who How Often Need? Date of Last Service Action  Abdominal Aortic Aneurysm Adults with AAA risk factors Once     Alcohol Misuse and Counseling All Adults Screening once a year if no alcohol misuse. Counseling up to 4 face to face sessions.     Bone Density Measurement  Adults at risk for osteoporosis Once every 2 yrs     Lipid Panel Z13.6 All adults without CV disease Once every 5 yrs     Colorectal Cancer   Stool sample or  Colonoscopy All adults 50 and older   Once every year  Every 10 years Y    Depression All Adults Once a year  Today   Diabetes Screening Blood glucose, post glucose load, or GTT Z13.1  All adults at risk  Pre-diabetics  Once per year  Twice per year     Diabetes  Self-Management Training All adults Diabetics 10 hrs first year; 2 hours subsequent years. Requires Copay     Glaucoma  Diabetics  Family history of glaucoma  African Americans 50 yrs +  Hispanic Americans 65 yrs + Annually - requires coppay Y    Hepatitis C Z72.89 or F19.20  High Risk for HCV  Born between 1945 and 1965  Annually  Once     HIV Z11.4 All adults based on risk  Annually btw ages 45 & 23 regardless of risk  Annually > 65 yrs if at increased risk     Lung Cancer Screening Asymptomatic adults  aged 6-77 with 30 pack yr history and current smoker OR quit within the last 15 yrs Annually Must have counseling and shared decision making documentation before first screen Y - if >30 pack yr hx     Medical Nutrition Therapy Adults with   Diabetes  Renal disease  Kidney transplant within past 3 yrs 3 hours first year; 2 hours subsequent years Y    Obesity and Counseling All adults Screening once a year Counseling if BMI 30 or higher Y Today   Tobacco Use Counseling Adults who use tobacco  Up to 8 visits in one year Y    Vaccines Z23  Hepatitis B  Influenza   Pneumonia  Adults   Once  Once every flu season  Two different vaccines separated by one year     Next Annual Wellness Visit People with Medicare Every year Y Today     Services & Screenings Women Who How Often Need  Date of Last Service Action  Mammogram  Z12.31 Women over 40 One baseline ages 16-39. Annually ager 25 yrs+ Y    Pap tests All women Annually if high risk. Every 2 yrs for normal risk women     Screening for cervical cancer with  Pap (Z01.419 nl or Z01.411abnl) &  HPV Z11.51 Women aged 63 to 6 Once every 5 yrs     Screening pelvic and breast exams All women Annually if high risk. Every 2 yrs for normal risk women     Sexually Transmitted Diseases  Chlamydia  Gonorrhea  Syphilis All at risk adults Annually for non pregnant females at increased risk         Services & Screenings Men Who How Ofter Need  Date of Last Service Action  Prostate Cancer - DRE & PSA Men over 50 Annually.  DRE might require a copay.     Sexually Transmitted Diseases  Syphilis All at risk adults Annually for men at increased risk       Glenford Bayley, MD 12/15/2020, 6:10 AM Pager: [phone]

## 2020-12-17 ENCOUNTER — Other Ambulatory Visit: Payer: Self-pay | Admitting: Internal Medicine

## 2020-12-17 DIAGNOSIS — F17200 Nicotine dependence, unspecified, uncomplicated: Secondary | ICD-10-CM

## 2020-12-17 DIAGNOSIS — R053 Chronic cough: Secondary | ICD-10-CM

## 2020-12-28 ENCOUNTER — Encounter: Payer: Self-pay | Admitting: Student

## 2020-12-28 ENCOUNTER — Ambulatory Visit (INDEPENDENT_AMBULATORY_CARE_PROVIDER_SITE_OTHER): Payer: Medicare Other | Admitting: Student

## 2020-12-28 ENCOUNTER — Other Ambulatory Visit: Payer: Self-pay

## 2020-12-28 VITALS — BP 121/63 | HR 77 | Temp 98.1°F | Ht 66.0 in | Wt 257.0 lb

## 2020-12-28 DIAGNOSIS — F17200 Nicotine dependence, unspecified, uncomplicated: Secondary | ICD-10-CM | POA: Diagnosis not present

## 2020-12-28 DIAGNOSIS — G629 Polyneuropathy, unspecified: Secondary | ICD-10-CM

## 2020-12-28 DIAGNOSIS — I10 Essential (primary) hypertension: Secondary | ICD-10-CM

## 2020-12-28 DIAGNOSIS — E119 Type 2 diabetes mellitus without complications: Secondary | ICD-10-CM | POA: Diagnosis not present

## 2020-12-28 DIAGNOSIS — Z1231 Encounter for screening mammogram for malignant neoplasm of breast: Secondary | ICD-10-CM | POA: Diagnosis not present

## 2020-12-28 LAB — POCT GLYCOSYLATED HEMOGLOBIN (HGB A1C): Hemoglobin A1C: 7.6 % — AB (ref 4.0–5.6)

## 2020-12-28 LAB — GLUCOSE, CAPILLARY: Glucose-Capillary: 171 mg/dL — ABNORMAL HIGH (ref 70–99)

## 2020-12-28 MED ORDER — OZEMPIC (0.25 OR 0.5 MG/DOSE) 2 MG/1.5ML ~~LOC~~ SOPN
0.2500 mg | PEN_INJECTOR | SUBCUTANEOUS | 4 refills | Status: DC
Start: 2020-12-28 — End: 2021-07-06

## 2020-12-28 NOTE — Assessment & Plan Note (Signed)
Lab Results  Component Value Date   HGBA1C 7.6 (A) 12/28/2020   Assessment: Repeat a1c is elevated from 7.4 to 7.6 in the past three months. Current regimen of farxiga 10 mg tabs daily. She has tried metformin in the past, but states it has caused her to have a diffuse skin rash. She has tried to work on her diet, eating healthier. She does endorse eating fried chicken/fish daily or every other day. I discussed with her that cutting this out of her diet to every other day and eventually once a week would help with her A1c as well as switching from coffee creamer with sugar to sugar free. We also discussed using sprout bread if she is to eat sliced bread.   Taking into consideration her age and co-morbidities, I do believe she would benefit from the addition of another medication. A GLP-1 would improve her A1c as well as help with weight loss.   Plan: -start ozempic .25 weekly for 4 weeks, uptitrate to 0.5 mg weekly after. -continue farxiga 10 mg daily.  -follow up in 3 months for a1c check and to assess medication adherence.

## 2020-12-28 NOTE — Assessment & Plan Note (Signed)
Assessment: Discussed smoking cessation, patient is down to one cigarette a day and is trying to stop completely. Denies needing any other assistance from [facility] She states she knows the benefit of quitting.   Plan: -continue to discuss cessation at subsequent visits.

## 2020-12-28 NOTE — Assessment & Plan Note (Signed)
Assessment: Neuropathic symptoms have improved with gabapentin.    Plan: -continue gabapentin 300 mg qHS. No need to up titrate at this time.

## 2020-12-28 NOTE — Patient Instructions (Signed)
Thank you, Nicole [name] for allowing [place] to provide your care today. Today we discussed:   Diabetes - Your a1c has increased from 7.4 to 7.6. We discussed ways to work on your diet, using sugar free creamer in your coffee and decreasing the amount of fried food you are eating per week. If you are going to eat sliced bread, try using sprout/whole grain like ezekiel or Dave's bread.   We will be starting you on ozempic as well, this is a once weekly injectable. You will inject 0.25 mg weekly for 4 weeks. After 4 weeks, increase to 0.5 mg weekly.   If you have any questions about how to administer the medication please give [place] a call! We will see you back in 3 months to recheck your A1c  I have ordered the following labs for you:   Lab Orders     Glucose, capillary     POC Hbg A1C   Referrals ordered today:   Referral Orders  No referral(s) requested today     I have ordered the following medication/changed the following medications:   Stop the following medications: There are no discontinued medications.   Start the following medications: Meds ordered this encounter  Medications  . Semaglutide,0.25 or 0.5MG /DOS, (OZEMPIC, 0.25 OR 0.5 MG/DOSE,) 2 MG/1.5ML SOPN    Sig: Inject 0.25 mg into the skin once a week. After 4 weeks, increase to .5 mg weekly.    Dispense:  1.5 mL    Refill:  4     Follow up: 3 months    Remember: Try to exercise 3-4 times a week!    Should you have any questions or concerns please call the internal medicine clinic at [phone].     Thalia Bloodgood, D.O. [facility]   Semaglutide injection solution What is this medicine? SEMAGLUTIDE (Sem a GLOO tide) is used to improve blood sugar control in adults with type 2 diabetes. This medicine may be used with other diabetes medicines. This drug may also reduce the risk of heart attack or stroke if you have type 2 diabetes and risk factors for heart disease. This medicine  may be used for other purposes; ask your health care provider or pharmacist if you have questions. COMMON BRAND NAME(S): OZEMPIC What should I tell my health care provider before I take this medicine? They need to know if you have any of these conditions:  endocrine tumors (MEN 2) or if someone in your family had these tumors  eye disease, vision problems  history of pancreatitis  kidney disease  stomach problems  thyroid cancer or if someone in your family had thyroid cancer  an unusual or allergic reaction to semaglutide, other medicines, foods, dyes, or preservatives  pregnant or trying to get pregnant  breast-feeding How should I use this medicine? This medicine is for injection under the skin of your upper leg (thigh), stomach area, or upper arm. It is given once every week (every 7 days). You will be taught how to prepare and give this medicine. Use exactly as directed. Take your medicine at regular intervals. Do not take it more often than directed. If you use this medicine with insulin, you should inject this medicine and the insulin separately. Do not mix them together. Do not give the injections right next to each other. Change (rotate) injection sites with each injection. It is important that you put your used needles and syringes in a special sharps container. Do not put them  in a trash can. If you do not have a sharps container, call your pharmacist or healthcare provider to get one. A special MedGuide will be given to you by the pharmacist with each prescription and refill. Be sure to read this information carefully each time. This drug comes with INSTRUCTIONS FOR USE. Ask your pharmacist for directions on how to use this drug. Read the information carefully. Talk to your pharmacist or health care provider if you have questions. Talk to your pediatrician regarding the use of this medicine in children. Special care may be needed. Overdosage: If you think you have taken too  much of this medicine contact a poison control center or emergency room at once. NOTE: This medicine is only for you. Do not share this medicine with others. What if I miss a dose? If you miss a dose, take it as soon as you can within 5 days after the missed dose. Then take your next dose at your regular weekly time. If it has been longer than 5 days after the missed dose, do not take the missed dose. Take the next dose at your regular time. Do not take double or extra doses. If you have questions about a missed dose, contact your health care provider for advice. What may interact with this medicine?  other medicines for diabetes Many medications may cause changes in blood sugar, these include:  alcohol containing beverages  antiviral medicines for HIV or AIDS  aspirin and aspirin-like drugs  certain medicines for blood pressure, heart disease, irregular heart beat  chromium  diuretics  female hormones, such as estrogens or progestins, birth control pills  fenofibrate  gemfibrozil  isoniazid  lanreotide  female hormones or anabolic steroids  MAOIs like Carbex, Eldepryl, Marplan, Nardil, and Parnate  medicines for weight loss  medicines for allergies, asthma, cold, or cough  medicines for depression, anxiety, or psychotic disturbances  niacin  nicotine  NSAIDs, medicines for pain and inflammation, like ibuprofen or naproxen  octreotide  pasireotide  pentamidine  phenytoin  probenecid  quinolone antibiotics such as ciprofloxacin, levofloxacin, ofloxacin  some herbal dietary supplements  steroid medicines such as prednisone or cortisone  sulfamethoxazole; trimethoprim  thyroid hormones Some medications can hide the warning symptoms of low blood sugar (hypoglycemia). You may need to monitor your blood sugar more closely if you are taking one of these medications. These include:  beta-blockers, often used for high blood pressure or heart problems (examples  include atenolol, metoprolol, propranolol)  clonidine  guanethidine  reserpine This list may not describe all possible interactions. Give your health care provider a list of all the medicines, herbs, non-prescription drugs, or dietary supplements you use. Also tell them if you smoke, drink alcohol, or use illegal drugs. Some items may interact with your medicine. What should I watch for while using this medicine? Visit your doctor or health care professional for regular checks on your progress. Drink plenty of fluids while taking this medicine. Check with your doctor or health care professional if you get an attack of severe diarrhea, nausea, and vomiting. The loss of too much body fluid can make it dangerous for you to take this medicine. A test called the HbA1C (A1C) will be monitored. This is a simple blood test. It measures your blood sugar control over the last 2 to 3 months. You will receive this test every 3 to 6 months. Learn how to check your blood sugar. Learn the symptoms of low and high blood sugar and how to  manage them. Always carry a quick-source of sugar with you in case you have symptoms of low blood sugar. Examples include hard sugar candy or glucose tablets. Make sure others know that you can choke if you eat or drink when you develop serious symptoms of low blood sugar, such as seizures or unconsciousness. They must get medical help at once. Tell your doctor or health care professional if you have high blood sugar. You might need to change the dose of your medicine. If you are sick or exercising more than usual, you might need to change the dose of your medicine. Do not skip meals. Ask your doctor or health care professional if you should avoid alcohol. Many nonprescription cough and cold products contain sugar or alcohol. These can affect blood sugar. Pens should never be shared. Even if the needle is changed, sharing may result in passing of viruses like hepatitis or HIV. Wear  a medical ID bracelet or chain, and carry a card that describes your disease and details of your medicine and dosage times. Do not become pregnant while taking this medicine. Women should inform their doctor if they wish to become pregnant or think they might be pregnant. There is a potential for serious side effects to an unborn child. Talk to your health care professional or pharmacist for more information. What side effects may I notice from receiving this medicine? Side effects that you should report to your doctor or health care professional as soon as possible:  allergic reactions like skin rash, itching or hives, swelling of the face, lips, or tongue  breathing problems  changes in vision  diarrhea that continues or is severe  lump or swelling on the neck  severe nausea  signs and symptoms of infection like fever or chills; cough; sore throat; pain or trouble passing urine  signs and symptoms of low blood sugar such as feeling anxious, confusion, dizziness, increased hunger, unusually weak or tired, sweating, shakiness, cold, irritable, headache, blurred vision, fast heartbeat, loss of consciousness  signs and symptoms of kidney injury like trouble passing urine or change in the amount of urine  trouble swallowing  unusual stomach upset or pain  vomiting Side effects that usually do not require medical attention (report to your doctor or health care professional if they continue or are bothersome):  constipation  diarrhea  nausea  pain, redness, or irritation at site where injected  stomach upset This list may not describe all possible side effects. Call your doctor for medical advice about side effects. You may report side effects to FDA at 1-800-FDA-1088. Where should I keep my medicine? Keep out of the reach of children. Store unopened pens in a refrigerator between 2 and 8 degrees C (36 and 46 degrees F). Do not freeze. Protect from light and heat. After you first  use the pen, it can be stored for 56 days at room temperature between 15 and 30 degrees C (59 and 86 degrees F) or in a refrigerator. Throw away your used pen after 56 days or after the expiration date, whichever comes first. Do not store your pen with the needle attached. If the needle is left on, medicine may leak from the pen. NOTE: This sheet is a summary. It may not cover all possible information. If you have questions about this medicine, talk to your doctor, pharmacist, or health care provider.  2021 Elsevier/Gold Standard (2019-07-14 09:41:51)
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
## 2020-12-30 ENCOUNTER — Telehealth: Payer: Self-pay

## 2020-12-30 NOTE — Telephone Encounter (Signed)
Pt would like an order for diabetic shoes pls [phone]

## 2021-01-02 ENCOUNTER — Encounter: Payer: Self-pay | Admitting: *Deleted

## 2021-01-02 ENCOUNTER — Other Ambulatory Visit: Payer: Self-pay | Admitting: Student

## 2021-01-02 DIAGNOSIS — E119 Type 2 diabetes mellitus without complications: Secondary | ICD-10-CM

## 2021-01-02 NOTE — Telephone Encounter (Signed)
Patient called in with request for getting diabetic shoes.[employer] has used [employer]. I will send to the yellow team to Place a DME order for Diabetic shoes.I will also send demographics,DME order and office notes [place], [place] C2/21/20223:32 PM

## 2021-01-02 NOTE — Telephone Encounter (Signed)
DME order placed. Thank you.

## 2021-01-05 NOTE — Telephone Encounter (Signed)
DME for Diabetic shoes,office notes,demographics faxed to [facility] on 01-02-2021 they will get in touch with patient.Fax number is [phone],Phone number is [phone] Filomena Jungling C2/24/202210:56 AM

## 2021-01-05 NOTE — Progress Notes (Signed)
Internal Medicine Clinic Attending  Case discussed with Dr. Katsadouros  At the time of the visit.  We reviewed the resident's history and exam and pertinent patient test results.  I agree with the assessment, diagnosis, and plan of care documented in the resident's note.  

## 2021-01-14 ENCOUNTER — Other Ambulatory Visit: Payer: Self-pay | Admitting: Internal Medicine

## 2021-01-14 DIAGNOSIS — R053 Chronic cough: Secondary | ICD-10-CM

## 2021-01-16 NOTE — Addendum Note (Signed)
Addended by: Neomia Dear on: 01/16/2021 01:45 PM   Modules accepted: Orders

## 2021-01-18 ENCOUNTER — Other Ambulatory Visit: Payer: Self-pay | Admitting: Student

## 2021-01-18 DIAGNOSIS — E119 Type 2 diabetes mellitus without complications: Secondary | ICD-10-CM

## 2021-01-26 ENCOUNTER — Encounter: Payer: Self-pay | Admitting: *Deleted

## 2021-02-01 ENCOUNTER — Telehealth: Payer: Self-pay | Admitting: Dietician

## 2021-02-01 ENCOUNTER — Encounter: Payer: Self-pay | Admitting: Dietician

## 2021-02-01 NOTE — Telephone Encounter (Signed)
Patient did not keep her appointment due to "Covid". She says she would like help rescheduling it. Call to Dr. Elmer Picker and it was rescheduled for: she was scheduled for Friday, June 23, 2021   1:25 PM. Notified patient by phone and letter.

## 2021-02-14 ENCOUNTER — Other Ambulatory Visit: Payer: Self-pay | Admitting: Student

## 2021-02-14 DIAGNOSIS — R053 Chronic cough: Secondary | ICD-10-CM

## 2021-02-15 NOTE — Telephone Encounter (Signed)
Next appt scheduled 5/23 with PCP. 

## 2021-03-13 ENCOUNTER — Other Ambulatory Visit: Payer: Self-pay | Admitting: Student

## 2021-03-13 DIAGNOSIS — R053 Chronic cough: Secondary | ICD-10-CM

## 2021-03-15 ENCOUNTER — Other Ambulatory Visit: Payer: Self-pay | Admitting: Student

## 2021-03-15 ENCOUNTER — Other Ambulatory Visit: Payer: Self-pay

## 2021-03-15 ENCOUNTER — Encounter: Payer: Self-pay | Admitting: Student

## 2021-03-15 ENCOUNTER — Ambulatory Visit (INDEPENDENT_AMBULATORY_CARE_PROVIDER_SITE_OTHER): Payer: Medicare Other | Admitting: Student

## 2021-03-15 ENCOUNTER — Telehealth: Payer: Self-pay

## 2021-03-15 VITALS — BP 135/80 | HR 92 | Temp 98.6°F | Ht 66.0 in | Wt 242.1 lb

## 2021-03-15 DIAGNOSIS — R252 Cramp and spasm: Secondary | ICD-10-CM | POA: Diagnosis not present

## 2021-03-15 DIAGNOSIS — E119 Type 2 diabetes mellitus without complications: Secondary | ICD-10-CM

## 2021-03-15 DIAGNOSIS — R053 Chronic cough: Secondary | ICD-10-CM

## 2021-03-15 DIAGNOSIS — K219 Gastro-esophageal reflux disease without esophagitis: Secondary | ICD-10-CM

## 2021-03-15 DIAGNOSIS — Z794 Long term (current) use of insulin: Secondary | ICD-10-CM

## 2021-03-15 DIAGNOSIS — B20 Human immunodeficiency virus [HIV] disease: Secondary | ICD-10-CM | POA: Diagnosis not present

## 2021-03-15 DIAGNOSIS — I10 Essential (primary) hypertension: Secondary | ICD-10-CM

## 2021-03-15 LAB — POCT GLYCOSYLATED HEMOGLOBIN (HGB A1C): Hemoglobin A1C: 6.3 % — AB (ref 4.0–5.6)

## 2021-03-15 LAB — GLUCOSE, CAPILLARY: Glucose-Capillary: 84 mg/dL (ref 70–99)

## 2021-03-15 MED ORDER — OMEPRAZOLE 10 MG PO CPDR: 40.0000 mg | DELAYED_RELEASE_CAPSULE | Freq: Every day | ORAL | 1 refills | Status: DC

## 2021-03-15 MED ORDER — OMEPRAZOLE MAGNESIUM 20 MG PO TBEC: 40.0000 mg | DELAYED_RELEASE_TABLET | Freq: Every day | ORAL | 1 refills | Status: DC

## 2021-03-15 MED ORDER — LOSARTAN POTASSIUM-HCTZ 50-12.5 MG PO TABS: 1.0000 | ORAL_TABLET | Freq: Every day | ORAL | 3 refills | Status: DC

## 2021-03-15 NOTE — Assessment & Plan Note (Signed)
Assessment: Patient states she has been on omeprazole 10 mg daily, with some improvement of her acid reflux.  She states she is continue to have symptoms.  We will increase to 40 mg daily.  If no improvement consider GI referral for endoscopy.  Plan: -Increase omeprazole to 40 mg daily -No improvement of GERD symptoms refer to GI.

## 2021-03-15 NOTE — Assessment & Plan Note (Signed)
Assessment: Patient states she has cramping of the right hand since cutting collard greens 2 weeks ago.  By physical exam she has no limited motion, strength is intact 5 out of 5.  Normal sensation.  Normal cap refill.  No abnormalities on physical exam.  States overall the pain is significantly improving.  While it does seem like 2 weeks as long time for this to occur, we will continue to monitor.  Discussed with patient the need to reach out to clinic if no improvement in the next week.   Plan: -Continue to monitor

## 2021-03-15 NOTE — Patient Instructions (Signed)
Thank you, [name] for allowing [place] to provide your care today. Today we discussed  Right hand cramping And line that you are improving right and cramping, I believe that this is from overuse.  If the pain does not improve please call the clinic.  Try using for warm compresses to help improve the cramping.  Diabetes Your hemoglobin A1c has improved congratulations on lowering this over the past few months.  Continue to cut out sweets from your diet and to monitor your sugars at home.  You are doing a great job, keep the work-up  Acid reflux We will increase your dose your omeprazole to 40 mg daily  HIV We will reach out to infectious disease to discuss you being out of your HIV medication.  I have ordered the following labs for you:   Lab Orders     Glucose, capillary     POC Hbg A1C    Referrals ordered today:   Referral Orders  No referral(s) requested today     I have ordered the following medication/changed the following medications:   Stop the following medications: There are no discontinued medications.   Start the following medications: No orders of the defined types were placed in this encounter.    Follow up: 4-6 months    Remember: Please call us if hand cramping does not improve  Should you have any questions or concerns please call the internal medicine clinic at [phone].     Thalia Bloodgood, D.O. [facility]

## 2021-03-15 NOTE — Telephone Encounter (Signed)
Receive call from [facility] with patient in their office stating she has been out of medication since January. Noted in Epic last refill sent in Jan with 11 refills. Staff called pharmacy, medication was ran through with no insurance issues. Attempted to call patient to make aware, but no answer. Patient will have $4 co-pay and pharmacy is getting medication ready for pick up today.  Internal Medicine made aware.  Valarie Cones

## 2021-03-15 NOTE — Assessment & Plan Note (Signed)
Assessment: Patient states she has been out of her HIV medicine for a month.  Discussed this with infectious disease team who called pharmacy and confirmed patient has a prescription in place.  State prescription will be ready by the end of the day and patient is able to pick this up.  Plan: -Continue to follow with ID

## 2021-03-15 NOTE — Assessment & Plan Note (Signed)
Assessment: A1c 6.3.  A1c is continue to improve, since patient stopped drinking coffee creamer with sugar in it.  States that she has been using sugar-free coffee.  Congratulated patient on this.  We will continue Ozempic as well as [place].   Plan: -Continue Ozempic and Farxiga -Recheck A1c in 3 months
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
## 2021-03-15 NOTE — Assessment & Plan Note (Signed)
Assessment: Blood pressure 135/80 today.  Current regimen losartan -HCTZ 50-12.5 mg daily.  Appears of the medication was expired.  Will reorder and call patient tomorrow to confirm that she has been taking this.  Plan: -Reorder losartan-hydrochlorothiazide -We will call patient tomorrow to confirm she is taking this.  Appears that she has not picked up her prescription in 3 months.

## 2021-03-16 NOTE — Progress Notes (Signed)
Internal Medicine Clinic Attending  Case discussed with Dr. Katsadouros  At the time of the visit.  We reviewed the resident's history and exam and pertinent patient test results.  I agree with the assessment, diagnosis, and plan of care documented in the resident's note.  

## 2021-03-21 NOTE — Telephone Encounter (Signed)
Please schedule an E visit for Nicole [name] so I can make sure she is getting her medication.  Thanks.

## 2021-03-23 NOTE — Telephone Encounter (Signed)
Spoke with Patient who states she no longer has any medication concerns. Patient was able to pick up medication from pharmacy and declined a  follow up at this time.

## 2021-04-03 ENCOUNTER — Encounter: Payer: Medicare Other | Admitting: Student

## 2021-04-07 ENCOUNTER — Other Ambulatory Visit: Payer: Self-pay | Admitting: Student

## 2021-04-07 DIAGNOSIS — R053 Chronic cough: Secondary | ICD-10-CM

## 2021-05-01 ENCOUNTER — Encounter (HOSPITAL_COMMUNITY): Payer: Self-pay | Admitting: Emergency Medicine

## 2021-05-01 ENCOUNTER — Ambulatory Visit (HOSPITAL_COMMUNITY)
Admission: EM | Admit: 2021-05-01 | Discharge: 2021-05-01 | Disposition: A | Discharge status: Home / Self Care | Payer: Medicare Other | Attending: Family Medicine | Admitting: Family Medicine

## 2021-05-01 ENCOUNTER — Other Ambulatory Visit: Payer: Self-pay

## 2021-05-01 DIAGNOSIS — R220 Localized swelling, mass and lump, head: Secondary | ICD-10-CM | POA: Diagnosis not present

## 2021-05-01 DIAGNOSIS — L03211 Cellulitis of face: Secondary | ICD-10-CM | POA: Diagnosis not present

## 2021-05-01 MED ORDER — AMOXICILLIN-POT CLAVULANATE 875-125 MG PO TABS: 1.0000 | ORAL_TABLET | Freq: Two times a day (BID) | ORAL | 0 refills | Status: AC

## 2021-05-01 NOTE — ED Triage Notes (Signed)
See prior note

## 2021-05-01 NOTE — ED Notes (Signed)
Left side of face swelling, no known injury.  Reports of pus.  Slightly abased left cheek.  No pus noted.  Left face is swollen.  No difficulty breathing or swallowing .  Patient reports swelling has improved
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
## 2021-05-01 NOTE — Discharge Instructions (Addendum)
I have sent in Augmentin for you to take twice a day for 10 days.  Follow up with this office or with primary care if symptoms are persisting.  Follow up in the ER for high fever, trouble swallowing, trouble breathing, other concerning symptoms.   

## 2021-05-10 ENCOUNTER — Other Ambulatory Visit: Payer: Self-pay | Admitting: Student

## 2021-05-10 DIAGNOSIS — E119 Type 2 diabetes mellitus without complications: Secondary | ICD-10-CM

## 2021-05-22 ENCOUNTER — Other Ambulatory Visit: Payer: Self-pay

## 2021-05-22 ENCOUNTER — Ambulatory Visit (INDEPENDENT_AMBULATORY_CARE_PROVIDER_SITE_OTHER): Payer: Medicare Other | Admitting: Student

## 2021-05-22 ENCOUNTER — Encounter: Payer: Self-pay | Admitting: Student

## 2021-05-22 DIAGNOSIS — E785 Hyperlipidemia, unspecified: Secondary | ICD-10-CM | POA: Diagnosis not present

## 2021-05-22 DIAGNOSIS — L03211 Cellulitis of face: Secondary | ICD-10-CM | POA: Insufficient documentation

## 2021-05-22 DIAGNOSIS — E119 Type 2 diabetes mellitus without complications: Secondary | ICD-10-CM

## 2021-05-22 DIAGNOSIS — Z0000 Encounter for general adult medical examination without abnormal findings: Secondary | ICD-10-CM | POA: Diagnosis not present

## 2021-05-22 MED ORDER — ZOSTER VAC RECOMB ADJUVANTED 50 MCG/0.5ML IM SUSR: 0.5000 mL | Freq: Once | INTRAMUSCULAR | 0 refills | Status: AC

## 2021-05-22 MED ORDER — ACCU-CHEK GUIDE VI STRP: ORAL_STRIP | 4 refills | Status: DC

## 2021-05-22 MED ORDER — PRAVASTATIN SODIUM 40 MG PO TABS
ORAL_TABLET | ORAL | 1 refills | Status: DC
Start: 2021-05-22 — End: 2021-10-19

## 2021-05-22 NOTE — Assessment & Plan Note (Addendum)
Assessment: Patient states she has not been taking her pravastatin for many months. Discussed with her importance of this medication. She states she will restart today, refilled pravastatin.   Plan: -reorder pravastatin 40 mg daily
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
## 2021-05-22 NOTE — Assessment & Plan Note (Addendum)
Patient states she normally receives phone call as well as letter mailed about colonoscopy and yearly eye exam.  She states that she spoke with her ophthalmologist who note that they will be scheduling her for an eye exam in August.  Discussed Shingrix vaccine, patient is interested.  Ordered shot, discussed with her that when she goes to the pharmacy to pick up her medication refills she can asked them about giving it that day.  Patient also states that she has not been using her cane and walker recently and that her arthritis has been causing her more difficulties with ambulation.  Denies any loss of consciousness falling and hitting her head.  Reinforced the importance of using her cane and walker when she ambulates.  Patient acknowledges understanding.  Discussed with her that if she continues to feel this way to call our clinic and be seen for this

## 2021-05-22 NOTE — Assessment & Plan Note (Signed)
Assessment: [name] presented to the ED on 6/20 after she noticed left-sided facial swelling.  She states the swelling was so severe that it caused blurred vision in her left eye.  She denies any difficulty swallowing or breathing.  Denies any dental pain.  She suspects that she was bitten by a bug and that this was infected.  She was seen in the ED and prescribed 10 days of Augmentin and instructed to follow-up with her clinic.  They did not believe she needed admission for I&D or evaluation by ophthalmology.  Today she states that the swelling has completely resolved, she has no difficulty with her vision.  She denies dental pain.  Denies any further drainage from the area.  On my exam there is not warm to touch, erythematous, or edematous.  The area is not tender to palpate.  There is no active drainage from the site.  There are no dental caries or lesions appreciated.  Plan: -Continue to monitor

## 2021-05-22 NOTE — Patient Instructions (Addendum)
Thank you, [name] for allowing [place] to provide your care today. Today we discussed   Facial Abscess I am glad to see you are doing better, please continue to watch that area and if you notice any swelling returning please give our office a call.   High cholesterol Please restart taking your pravastatin. I have refilled this medication for you.    Have also written you for the Shingrix vaccine, please ask your pharmacist about this when you go pick up your cholesterol medicine.  I have ordered the following labs for you:  Lab Orders  No laboratory test(s) ordered today     Referrals ordered today:   Referral Orders  No referral(s) requested today     I have ordered the following medication/changed the following medications:   Stop the following medications: Medications Discontinued During This Encounter  Medication Reason   pravastatin (PRAVACHOL) 40 MG tablet Reorder   glucose blood (ACCU-CHEK GUIDE) test strip Reorder     Start the following medications: Meds ordered this encounter  Medications   glucose blood (ACCU-CHEK GUIDE) test strip    Sig: Check blood sugar one time daily    Dispense:  100 each    Refill:  4    The patient is insulin requiring, ICD 10 code E11.9. The patient tests 1 times per day.   pravastatin (PRAVACHOL) 40 MG tablet    Sig: TAKE 1 TABLET BY MOUTH EVERYDAY AT BEDTIME    Dispense:  90 tablet    Refill:  1     Follow up: 4-6 months A1c recheck.    Should you have any questions or concerns please call the internal medicine clinic at [phone].     Thalia Bloodgood, D.O. [facility]

## 2021-05-29 NOTE — Progress Notes (Signed)
Internal Medicine Clinic Attending  Case discussed with Dr. Katsadouros  At the time of the visit.  We reviewed the resident's history and exam and pertinent patient test results.  I agree with the assessment, diagnosis, and plan of care documented in the resident's note.  

## 2021-06-04 ENCOUNTER — Other Ambulatory Visit: Payer: Self-pay | Admitting: Internal Medicine

## 2021-06-04 DIAGNOSIS — B20 Human immunodeficiency virus [HIV] disease: Secondary | ICD-10-CM

## 2021-07-06 ENCOUNTER — Other Ambulatory Visit: Payer: Self-pay | Admitting: Internal Medicine

## 2021-07-06 ENCOUNTER — Other Ambulatory Visit: Payer: Self-pay | Admitting: Student

## 2021-07-06 DIAGNOSIS — E119 Type 2 diabetes mellitus without complications: Secondary | ICD-10-CM

## 2021-07-06 DIAGNOSIS — B20 Human immunodeficiency virus [HIV] disease: Secondary | ICD-10-CM

## 2021-07-15 ENCOUNTER — Other Ambulatory Visit: Payer: Self-pay | Admitting: Internal Medicine

## 2021-07-15 DIAGNOSIS — B20 Human immunodeficiency virus [HIV] disease: Secondary | ICD-10-CM

## 2021-07-19 ENCOUNTER — Other Ambulatory Visit: Payer: Self-pay | Admitting: Internal Medicine

## 2021-07-19 DIAGNOSIS — F17200 Nicotine dependence, unspecified, uncomplicated: Secondary | ICD-10-CM

## 2021-09-07 ENCOUNTER — Other Ambulatory Visit: Payer: Self-pay | Admitting: Student

## 2021-09-07 DIAGNOSIS — E119 Type 2 diabetes mellitus without complications: Secondary | ICD-10-CM

## 2021-09-09 ENCOUNTER — Other Ambulatory Visit: Payer: Self-pay | Admitting: Student

## 2021-09-09 DIAGNOSIS — E119 Type 2 diabetes mellitus without complications: Secondary | ICD-10-CM

## 2021-10-01 ENCOUNTER — Emergency Department (HOSPITAL_COMMUNITY)
Admission: EM | Admit: 2021-10-01 | Discharge: 2021-10-01 | Disposition: A | Discharge status: Home / Self Care | Payer: Medicare Other | Attending: Emergency Medicine | Admitting: Emergency Medicine

## 2021-10-01 ENCOUNTER — Other Ambulatory Visit: Payer: Self-pay

## 2021-10-01 ENCOUNTER — Emergency Department (HOSPITAL_BASED_OUTPATIENT_CLINIC_OR_DEPARTMENT_OTHER)
Admit: 2021-10-01 | Discharge: 2021-10-01 | Disposition: A | Discharge status: Home / Self Care | Payer: Medicare Other | Attending: Emergency Medicine | Admitting: Emergency Medicine

## 2021-10-01 ENCOUNTER — Encounter (HOSPITAL_COMMUNITY): Payer: Self-pay

## 2021-10-01 DIAGNOSIS — I10 Essential (primary) hypertension: Secondary | ICD-10-CM | POA: Diagnosis not present

## 2021-10-01 DIAGNOSIS — M25551 Pain in right hip: Secondary | ICD-10-CM | POA: Insufficient documentation

## 2021-10-01 DIAGNOSIS — E039 Hypothyroidism, unspecified: Secondary | ICD-10-CM | POA: Diagnosis not present

## 2021-10-01 DIAGNOSIS — G8929 Other chronic pain: Secondary | ICD-10-CM | POA: Diagnosis not present

## 2021-10-01 DIAGNOSIS — Z21 Asymptomatic human immunodeficiency virus [HIV] infection status: Secondary | ICD-10-CM | POA: Diagnosis not present

## 2021-10-01 DIAGNOSIS — M25561 Pain in right knee: Secondary | ICD-10-CM | POA: Insufficient documentation

## 2021-10-01 DIAGNOSIS — Z96651 Presence of right artificial knee joint: Secondary | ICD-10-CM | POA: Diagnosis not present

## 2021-10-01 DIAGNOSIS — F17210 Nicotine dependence, cigarettes, uncomplicated: Secondary | ICD-10-CM | POA: Insufficient documentation

## 2021-10-01 DIAGNOSIS — Z7984 Long term (current) use of oral hypoglycemic drugs: Secondary | ICD-10-CM | POA: Diagnosis not present

## 2021-10-01 DIAGNOSIS — M79604 Pain in right leg: Secondary | ICD-10-CM | POA: Diagnosis not present

## 2021-10-01 DIAGNOSIS — Z96643 Presence of artificial hip joint, bilateral: Secondary | ICD-10-CM | POA: Insufficient documentation

## 2021-10-01 DIAGNOSIS — Z79899 Other long term (current) drug therapy: Secondary | ICD-10-CM | POA: Insufficient documentation

## 2021-10-01 DIAGNOSIS — E1140 Type 2 diabetes mellitus with diabetic neuropathy, unspecified: Secondary | ICD-10-CM | POA: Insufficient documentation

## 2021-10-01 LAB — CBC WITH DIFFERENTIAL/PLATELET
Abs Immature Granulocytes: 0.03 10*3/uL (ref 0.00–0.07)
Basophils Absolute: 0.1 10*3/uL (ref 0.0–0.1)
Basophils Relative: 1 %
Eosinophils Absolute: 0.1 10*3/uL (ref 0.0–0.5)
Eosinophils Relative: 1 %
HCT: 41.4 % (ref 36.0–46.0)
Hemoglobin: 13.9 g/dL (ref 12.0–15.0)
Immature Granulocytes: 0 %
Lymphocytes Relative: 18 %
Lymphs Abs: 1.7 10*3/uL (ref 0.7–4.0)
MCH: 30.7 pg (ref 26.0–34.0)
MCHC: 33.6 g/dL (ref 30.0–36.0)
MCV: 91.4 fL (ref 80.0–100.0)
Monocytes Absolute: 0.6 10*3/uL (ref 0.1–1.0)
Monocytes Relative: 7 %
Neutro Abs: 7.1 10*3/uL (ref 1.7–7.7)
Neutrophils Relative %: 73 %
Platelets: 196 10*3/uL (ref 150–400)
RBC: 4.53 MIL/uL (ref 3.87–5.11)
RDW: 12.8 % (ref 11.5–15.5)
WBC: 9.7 10*3/uL (ref 4.0–10.5)
nRBC: 0 % (ref 0.0–0.2)

## 2021-10-01 LAB — BASIC METABOLIC PANEL
Anion gap: 6 (ref 5–15)
BUN: 15 mg/dL (ref 8–23)
CO2: 25 mmol/L (ref 22–32)
Calcium: 9.2 mg/dL (ref 8.9–10.3)
Chloride: 109 mmol/L (ref 98–111)
Creatinine, Ser: 0.52 mg/dL (ref 0.44–1.00)
GFR, Estimated: >60 mL/min (ref >60)
Glucose, Bld: 155 mg/dL — ABNORMAL HIGH (ref 70–99)
Potassium: 3.6 mmol/L (ref 3.5–5.1)
Sodium: 140 mmol/L (ref 135–145)

## 2021-10-01 MED ORDER — OXYCODONE-ACETAMINOPHEN 5-325 MG PO TABS
1.0000 | ORAL_TABLET | Freq: Once | ORAL | Status: AC
  Administered 2021-10-01: 1 via ORAL
  Filled 2021-10-01: qty 1

## 2021-10-01 NOTE — ED Notes (Signed)
Save blue tube in main lab °
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
## 2021-10-01 NOTE — Progress Notes (Signed)
Right lower extremity venous duplex has been completed. Preliminary results can be found in CV Proc through chart review.  Results were given to Lorin Roemhildt PA.  10/01/21 11:23 AM Olen Cordial RVT

## 2021-10-01 NOTE — ED Triage Notes (Signed)
Patient present to the ED with c/o chronic right  leg pain. Patient report increase in pain and immobility

## 2021-10-01 NOTE — Discharge Instructions (Addendum)
You are seen in the emergency department today for right leg pain.  I am happy that the medication we gave you in the emergency department seem to control your pain. I'd like you to continue taking ibuprofen, muscle relaxer, and gabapentin at home. You can add on tylenol as needed for pain as well. Please follow up with your primary doctor at your next appointment to discuss your long term pain regimen.   As we discussed I think physical therapy would be beneficial for your pain as well as I've attached the contact information for a PT office for you to call and make an appointment.   Continue to monitor how you're doing and return to the ER for new or worsening symptoms such as numbness, tingling, or new weakness.   It has been a pleasure seeing and caring for you today and I hope you start feeling better soon!

## 2021-10-03 DIAGNOSIS — M25561 Pain in right knee: Secondary | ICD-10-CM | POA: Diagnosis not present

## 2021-10-03 DIAGNOSIS — M1611 Unilateral primary osteoarthritis, right hip: Secondary | ICD-10-CM | POA: Diagnosis not present

## 2021-10-03 DIAGNOSIS — M1612 Unilateral primary osteoarthritis, left hip: Secondary | ICD-10-CM | POA: Diagnosis not present

## 2021-10-10 ENCOUNTER — Observation Stay: Admission: AD | Admit: 2021-10-10 | Payer: Medicare Other | Source: Ambulatory Visit | Admitting: Internal Medicine

## 2021-10-10 ENCOUNTER — Ambulatory Visit (INDEPENDENT_AMBULATORY_CARE_PROVIDER_SITE_OTHER): Payer: Medicare Other | Admitting: Student

## 2021-10-10 ENCOUNTER — Encounter: Payer: Self-pay | Admitting: Student

## 2021-10-10 VITALS — BP 136/83 | HR 76 | Temp 98.4°F

## 2021-10-10 DIAGNOSIS — R531 Weakness: Secondary | ICD-10-CM | POA: Diagnosis not present

## 2021-10-10 DIAGNOSIS — Z23 Encounter for immunization: Secondary | ICD-10-CM

## 2021-10-10 DIAGNOSIS — I6930 Unspecified sequelae of cerebral infarction: Secondary | ICD-10-CM | POA: Insufficient documentation

## 2021-10-10 DIAGNOSIS — Z0000 Encounter for general adult medical examination without abnormal findings: Secondary | ICD-10-CM

## 2021-10-10 DIAGNOSIS — M79604 Pain in right leg: Secondary | ICD-10-CM | POA: Insufficient documentation

## 2021-10-10 DIAGNOSIS — F439 Reaction to severe stress, unspecified: Secondary | ICD-10-CM

## 2021-10-10 DIAGNOSIS — R252 Cramp and spasm: Secondary | ICD-10-CM | POA: Diagnosis not present

## 2021-10-10 DIAGNOSIS — IMO0001 Reserved for inherently not codable concepts without codable children: Secondary | ICD-10-CM

## 2021-10-10 DIAGNOSIS — Z299 Encounter for prophylactic measures, unspecified: Secondary | ICD-10-CM | POA: Diagnosis not present

## 2021-10-10 DIAGNOSIS — E119 Type 2 diabetes mellitus without complications: Secondary | ICD-10-CM | POA: Diagnosis not present

## 2021-10-10 LAB — POCT GLYCOSYLATED HEMOGLOBIN (HGB A1C): Hemoglobin A1C: 6.5 % — AB (ref 4.0–5.6)

## 2021-10-10 LAB — GLUCOSE, CAPILLARY: Glucose-Capillary: 184 mg/dL — ABNORMAL HIGH (ref 70–99)

## 2021-10-10 NOTE — Addendum Note (Signed)
Addended by: Belva Agee on: 10/10/2021 07:17 PM   Modules accepted: Orders

## 2021-10-10 NOTE — Assessment & Plan Note (Signed)
Assessment: Patient has had many life stressors recently. Her sister passed away from cancer this past year. During that time, the patient stayed with her sister and helped care for her prior to her passing. She has also had to live with her daughter since she has had difficulty walking. This has led to increase stressors as well. She is tearful during parts of the exam when talking about feeling down and sad. She is open to meeting with counseling services.   Plan: referral placed to [facility]

## 2021-10-10 NOTE — Assessment & Plan Note (Signed)
Assessment: Patiently evaluated in emergency department for right leg pain.  This started when she was going from sitting to standing.  She states that the pain started in her knee and moved up her thigh on the outer portions.  She endorses pain being different from her burning and tingling that she normally has in her legs.  She states that the pain is gotten better over time.  While in the emergency department she was evaluated for a DVT as she did have swelling of the right leg.  Imaging was negative.  She was thought to have muscle strain and discharged home with follow-up in our clinic.  Today she states that the pain is no longer there.  The pain follows the distribution of her iliotibial band.  We will continue to monitor for recurrence of pain and swelling.   Plan: -Continue to monitor
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
## 2021-10-10 NOTE — Assessment & Plan Note (Signed)
Assessment: A1c of 6.5 from 6.3.  Continue to do well on Ozempic and [place].  She was checking glucose levels every morning.  Discussed with patient that is no longer needed as she is not on any medications that would lead to hypoglycemia and I do not see this is benefiting her.  Plan: -Discontinue daily glucose checks -Continue Farxiga and Ozempic
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
## 2021-10-12 ENCOUNTER — Telehealth: Payer: Self-pay | Admitting: *Deleted

## 2021-10-12 ENCOUNTER — Telehealth: Payer: Self-pay | Admitting: Internal Medicine

## 2021-10-12 ENCOUNTER — Ambulatory Visit (HOSPITAL_COMMUNITY): Admission: RE | Admit: 2021-10-12 | Payer: Medicare Other | Source: Ambulatory Visit

## 2021-10-12 DIAGNOSIS — R531 Weakness: Secondary | ICD-10-CM

## 2021-10-12 NOTE — Telephone Encounter (Signed)
Working on [employer] paper work for patient. She is to call back with phone number for her niece -Benn Moulder. She is to be the contact person on her PCS form.

## 2021-10-12 NOTE — Telephone Encounter (Signed)
Thank you Nicole [name], I agree if patient has any worsening she needs to go to ED. I believe she is still stable enough to wait for the call and hopeful that a bed becomes available soon.

## 2021-10-12 NOTE — Telephone Encounter (Signed)
Call to Niece about MRI tonight.  Unable to reach and message was left x 2 to call the Clinics.   Spoke to patient's son about MRI tonight stated could do but would call back to confirm and get instructions about. Angelina Ok, RN 10/12/2021 1:54 PM.

## 2021-10-12 NOTE — Telephone Encounter (Signed)
Call from patient's son Keryn Nessler regarding need for [facility] for patient.  Jerilynn Som stated that patient is alone at home for about 10 hours a day. Son was informed that a message would be  sent to Dr. Evie Lacks for order for [facility] for patient.   Angelina Ok, RN 10/12/2021 12:03 PM

## 2021-10-12 NOTE — Telephone Encounter (Addendum)
PCS services ordered, will be by tomorrow to fill out correct paperwork. Can we please call patient's son back and inform him of this. I am off site today but plan to be in the hospital tomorrow and can discuss more with the patient and her family tomorrow. Thank you

## 2021-10-12 NOTE — Telephone Encounter (Addendum)
Call from patient's niece Charlestine Massed regarding patient's Admission status.  Patient has been waiting since Tuesday for the call to come in.   Call to Admitting spoke with Robin.  No beds are available for patient's Admission at [facility] or [facility].  May be tomorrow before a bed will be available.  Patient will need to go to the ER if worsens and cannot wait at home.  RTC to [employer] message was left that there are still not beds available and that patient remains on the list. Message to be forwarded to Dr. Evie Lacks, [facility] Team and the Attendings.  Angelina Ok, RN 10/12/2021 10:45 AM. RTC from patient's niece Lovette Cliche information about MRI today at 10 pm.  To arrive at 9:45 pm at [facility] MRI.  Offered number for MI refused stated she was going to take patient and did'nt need number.  Angelina Ok, RN 4:07 PM

## 2021-10-12 NOTE — Telephone Encounter (Signed)
Received message from RN regarding delay in admission. She was seen in clinic two days ago with weakness and concern for subacute CVA. Plan was for admission for expedited work up, unfortunately there are no hospital beds available. There are still no beds available today and we were told they cannot guarantee a bed tomorrow either. Since she is outside of the window for tPA or endovascular intervention, will cancel admission and proceed with outpatient work up. I have ordered a STAT MRI brain and will ask her to follow up in clinic afterwards. Further work up pending results.

## 2021-10-12 NOTE — Telephone Encounter (Signed)
SPOKE WITH PATIENT REGARDING HER PCS FORM FOR CONTACT PERSON. SHE IS WANTING HER NIECE SHARON GOINS TO BE LISTED. SHE IS TO CALL BACK  [phone]) WITH THE PHONE NUMBER FOR HER NIECE.

## 2021-10-13 ENCOUNTER — Telehealth: Payer: Self-pay | Admitting: *Deleted

## 2021-10-13 NOTE — Telephone Encounter (Signed)
Call to patient's niece Jasmine December to have her take patient to the ER.  Message was left for Jasmine December to take patient to the ER as soon as possible.  Jasmine December was also told to call the Clinics for explanation of plan.  Plan is to have patient go to the ER for follow up as she has not been seen or assessed since Tuesday and to facilitate work up.  Angelina Ok, RN 10/13/2021 1:02 PM.

## 2021-10-13 NOTE — Telephone Encounter (Signed)
Patient called concerning need for PCS.  Patient was informed that when the referral is placed that it will take a few days and that someone will come out to see her to determine need.  Patient voiced understanding.  When asked why she did not go for the MRI.  Patient stated that her son said that the time was too late.  Explained to patient the importance of getting the MRI.  Patient asked that it be scheduled again. Spoke to Dr. Heide Spark who requested that the patient go to the ER as patient has not been seen since Tuesday. Needs an assessment of her symptoms. Patient was informed of need to go to the ER.  Patient stated she would call her niece to take her.  Patient was told to have her niece call the Clinics for further explanation of need to go to the ER.  Patient stated that she would do so.  Angelina Ok, RN 10/13/2021 12:22 PM.

## 2021-10-13 NOTE — Telephone Encounter (Signed)
Call from Admitting earlier today about Admission Bed for patient.  Well need to speak with Attending today to see if bed is still needed as patient cancelled MRI that was scheduled for last night.  Spoke with Dr. Earlie Raveling who wants to keep patient on list for Admission.  Call to Admitting -patient will remain on Admission list. Still no beds available at [facility],  10/13/2021 10:17 AM

## 2021-10-15 ENCOUNTER — Ambulatory Visit (HOSPITAL_COMMUNITY)
Admission: RE | Admit: 2021-10-15 | Discharge: 2021-10-15 | Disposition: A | Discharge status: Home / Self Care | Payer: Medicare Other | Source: Ambulatory Visit | Attending: Internal Medicine | Admitting: Internal Medicine

## 2021-10-15 DIAGNOSIS — R29818 Other symptoms and signs involving the nervous system: Secondary | ICD-10-CM | POA: Diagnosis not present

## 2021-10-15 DIAGNOSIS — R531 Weakness: Secondary | ICD-10-CM | POA: Diagnosis not present

## 2021-10-15 DIAGNOSIS — I639 Cerebral infarction, unspecified: Secondary | ICD-10-CM | POA: Diagnosis not present

## 2021-10-16 ENCOUNTER — Telehealth: Payer: Self-pay | Admitting: Student

## 2021-10-16 NOTE — Telephone Encounter (Signed)
Return pt's call - stated she had test done (MRI) yesterday. Pt's aunt, who's at the home, stated requesting MRI results and what's the next plan.

## 2021-10-16 NOTE — Telephone Encounter (Signed)
Paper work for [employer] is in [employer]. Dr. Kirtland Bouchard to complete once patient has had MRI done.fo

## 2021-10-16 NOTE — Telephone Encounter (Signed)
Pt is calling back to speak with a nurse. Please call back.  

## 2021-10-16 NOTE — Telephone Encounter (Signed)
Front office please schedule pt a f/u appt per Dr Cyndie Chime. Thanks

## 2021-10-16 NOTE — Telephone Encounter (Signed)
I called and spoke to Nicole [name] and her sister on the phone.  I informed her of the MRI result.  There was no acute CVA seen but there were chronic ischemia in the right MCA and right PCA territories.  Patient will not require admission at this time but will need closed follow-up with outpatient to discuss CVA work-up.  She will likely need MRA of head and neck with carotid ultrasound.  Given she had ischemic infarct in 2 territories, cannot rule out embolic stroke.  She will likely need an echo as well.  Consider referral to neurology.  In the meantime we will start patient on aspirin 81 mg daily.  We will follow-up with patient in the clinic to go over CVA work-up.  Dr. Evie Lacks already reached out to social worker for disposition to rehab facility.  Family verbalized agreement with the plan.

## 2021-10-17 ENCOUNTER — Telehealth: Payer: Self-pay | Admitting: *Deleted

## 2021-10-17 NOTE — Telephone Encounter (Signed)
Call to patient on 10/16/2021 to inform her of need to schedule a follow up appointment after her MRI.  Message was left for patient to call the Clinics as soon as possible.  Angelina Ok, RN 10/16/2021 10:35 AM

## 2021-10-18 ENCOUNTER — Encounter: Payer: Self-pay | Admitting: Student

## 2021-10-18 NOTE — Telephone Encounter (Signed)
Per chart, pt has an appt 12/8 with Dr Evie Lacks.

## 2021-10-19 ENCOUNTER — Encounter: Payer: Self-pay | Admitting: Student

## 2021-10-19 ENCOUNTER — Other Ambulatory Visit: Payer: Self-pay

## 2021-10-19 ENCOUNTER — Ambulatory Visit (INDEPENDENT_AMBULATORY_CARE_PROVIDER_SITE_OTHER): Payer: Medicare Other | Admitting: Student

## 2021-10-19 VITALS — BP 138/83 | HR 60 | Temp 98.0°F | Ht 66.0 in

## 2021-10-19 DIAGNOSIS — I6389 Other cerebral infarction: Secondary | ICD-10-CM

## 2021-10-19 DIAGNOSIS — F17200 Nicotine dependence, unspecified, uncomplicated: Secondary | ICD-10-CM

## 2021-10-19 DIAGNOSIS — R531 Weakness: Secondary | ICD-10-CM | POA: Diagnosis not present

## 2021-10-19 DIAGNOSIS — E785 Hyperlipidemia, unspecified: Secondary | ICD-10-CM

## 2021-10-19 DIAGNOSIS — Z8673 Personal history of transient ischemic attack (TIA), and cerebral infarction without residual deficits: Secondary | ICD-10-CM

## 2021-10-19 DIAGNOSIS — M79604 Pain in right leg: Secondary | ICD-10-CM

## 2021-10-19 DIAGNOSIS — I6930 Unspecified sequelae of cerebral infarction: Secondary | ICD-10-CM | POA: Diagnosis not present

## 2021-10-19 MED ORDER — ROSUVASTATIN CALCIUM 20 MG PO TABS: 20.0000 mg | ORAL_TABLET | Freq: Every day | ORAL | 1 refills | Status: DC

## 2021-10-19 NOTE — Patient Instructions (Addendum)
Thank you, [name] for allowing [place] to provide your care today. Today we discussed .  Chronic strokes We will continue to work on getting you which you need at home.  We will be starting you on a higher level cholesterol medicine called rosuvastatin.  We will also have you continue taking your aspirin.  I will be placing multiple labs including an echocardiogram to look at your heart, further imaging to look at your head and neck, as well as checking her cholesterol levels today.  We will be putting in referrals for you to see neurology as well as physical therapy and Occupational Therapy.  I will also be filling out paperwork for personal care services.  I will also place an order for a wheelchair.  Our team as well as social work team will help get everything together for you.  I have ordered the following labs for you:   Lab Orders         Lipid Profile      Referrals ordered today:   Referral Orders  No referral(s) requested today     I have ordered the following medication/changed the following medications:   Stop the following medications: Medications Discontinued During This Encounter  Medication Reason   pravastatin (PRAVACHOL) 40 MG tablet      Start the following medications: Meds ordered this encounter  Medications   rosuvastatin (CRESTOR) 20 MG tablet    Sig: Take 1 tablet (20 mg total) by mouth daily.    Dispense:  90 tablet    Refill:  1     Follow up:  1 month or sooner if needed      Should you have any questions or concerns please call the internal medicine clinic at [phone].    Thalia Bloodgood, D.O. [facility]

## 2021-10-20 ENCOUNTER — Other Ambulatory Visit: Payer: Self-pay | Admitting: Student

## 2021-10-20 ENCOUNTER — Encounter: Payer: Self-pay | Admitting: *Deleted

## 2021-10-20 ENCOUNTER — Ambulatory Visit: Payer: Medicare Other

## 2021-10-20 DIAGNOSIS — I6930 Unspecified sequelae of cerebral infarction: Secondary | ICD-10-CM

## 2021-10-20 DIAGNOSIS — I6389 Other cerebral infarction: Secondary | ICD-10-CM

## 2021-10-20 DIAGNOSIS — I4891 Unspecified atrial fibrillation: Secondary | ICD-10-CM

## 2021-10-20 DIAGNOSIS — I639 Cerebral infarction, unspecified: Secondary | ICD-10-CM

## 2021-10-20 LAB — LIPID PANEL
Chol/HDL Ratio: 2.7 ratio (ref 0.0–4.4)
Cholesterol, Total: 173 mg/dL (ref 100–199)
HDL: 63 mg/dL (ref >39)
LDL Chol Calc (NIH): 95 mg/dL (ref 0–99)
Triglycerides: 79 mg/dL (ref 0–149)
VLDL Cholesterol Cal: 15 mg/dL (ref 5–40)

## 2021-10-20 NOTE — Assessment & Plan Note (Addendum)
Assessment: Patient presented with worsening weakness over the past 6 months, ultimately requiring two person assistance. Upon further examination patient was found to have left sided facial droop of uncertain timeline as well as weakness in her extremities. MRI head w/o contrast performed which revealed chronic ischemia of the right PCA and right MCA. Patient has no significant differences from neurological exam last week.   Do not believe patient will require admission for further workup. Will order CTA head/neck, echocardiogram, and cardiac monitoring. No history of abnormal heart rhythm or symptoms that allude to this. Will continue on 81 aspirin and switch from moderate intensity statin to high intensity statin. Neuro PT, OT referral placed. Do not believe she needs speech evaluation as no difficulties with speech or swallowing noted on visit today. Also believe patient would benefit from a neurology referral.   Currently patient is living with family members (niece and sister) who help patient perform all of her ADL's secondary to her history of left sided hemiparesis and new onset right lower extremity weakness. Order placed for PCS to assist with her ADL's. Patient needs assistance with showering, cooking, using the bathroom. Once evaluated by PT/OT will have better understand of patient's needs. We did discuss possibility for long term care facility if needed. Patient and family would like to try avoid LTC facility of possible.   Plan: -Further work up to determine etiology of CVA, echocardiogram, cardiac monitor, CTA head/neck. -Neurology consult placed -Aspirin 81 mg daily and transition to rosuvastatin 20 mg daily -continue diabetic and HTN management -continue smoking cessation -PT/OT referral placed -PCS paperwork filled out, social work referral also placed

## 2021-10-20 NOTE — Assessment & Plan Note (Addendum)
Assessment: Patient continues to endorse intermittent right lateral lower extremity pain that she describes as a burning/tingling. She denies back pain associated. She denies worsening of the pain since last evaluated. The pain improves when she takes her evening gabapentin.   Pain description consistent with neuropathic pain. Description and location of pain consistent with meralgia paresthetica. Less likely L3/L4 neuropathy or herniation, patient denies back pain. Low suspicion for lumbar plexopathy or femoral neuropathy. ABI's unremarkable. Do not suspect secondary to thrombus   Plan: -conservative management, discuss not wearing belts or tight fitting clothing.  -continue to encourage weight loss -increase gabapentin to 300 mg twice daily -if no improvement consider further imaging or EMG

## 2021-10-20 NOTE — Progress Notes (Unsigned)
Enrolled for [employer] to mail a ZIO XT long term holter monitor to the patients address on file.  Letter with instructions mailed to patient. 

## 2021-10-20 NOTE — Assessment & Plan Note (Signed)
Transitioning to high intensity statin from moderate 2/2 chronic CVA's.

## 2021-10-20 NOTE — Assessment & Plan Note (Signed)
Will need to continue smoking cessation with patient's chronic CVA's
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
## 2021-10-20 NOTE — Assessment & Plan Note (Signed)
Assessment: Patient presented with worsening weakness over the past 6 months, ultimately requiring two person assistance. Upon further examination patient was found to have left sided facial droop of uncertain timeline as well as weakness in her extremities. MRI head w/o contrast performed which revealed chronic ischemia of the right PCA and right MCA. Patient has no significant differences from neurological exam last week.   Do not believe patient will require admission for further workup. Will order CTA head/neck, echocardiogram, and cardiac monitoring. No history of abnormal heart rhythm or symptoms that allude to this. Will continue on 81 aspirin and switch from moderate intensity statin to high intensity statin. Neuro PT, OT referral placed. Do not believe she needs speech evaluation as no difficulties with speech or swallowing noted on visit today. Also believe patient would benefit from a neurology referral.   Currently patient is living with family members (niece and sister) who help patient perform all of her ADL's secondary to her history of left sided hemiparesis and new onset right lower extremity weakness. Order placed for PCS to assist with her ADL's. Patient needs assistance with showering, cooking, using the bathroom. Once evaluated by PT/OT will have better understand of patient's needs. We did discuss possibility for long term care facility if needed. Patient and family would like to try avoid LTC facility of possible.   Plan: -Further work up to determine etiology of CVA, echocardiogram, cardiac monitor, CTA head/neck. -Neurology consult placed -Aspirin 81 mg daily and transition to rosuvastatin 20 mg daily -PT/OT referral placed -PCS paperwork filled out, social work referral also placed

## 2021-10-23 NOTE — Progress Notes (Signed)
Internal Medicine Clinic Attending  Case discussed with Dr. Katsadouros  At the time of the visit.  We reviewed the resident's history and exam and pertinent patient test results.  I agree with the assessment, diagnosis, and plan of care documented in the resident's note.  

## 2021-10-24 ENCOUNTER — Telehealth: Payer: Self-pay | Admitting: *Deleted

## 2021-10-24 NOTE — Telephone Encounter (Signed)
° °  Telephone encounter was:  Successful.  10/24/2021 Name: Nicole [name] MRN: [id number] DOB: [birth year]  Nicole [name] is a 60 y.o. year old female who is a primary care patient of Katsadouros, Heidi Dach, MD . The community resource team was consulted for assistance with patient asked that i call Niece and gave permission to address her needs with her Benn Moulder [phone]  Care guide performed the following interventions: Patient provided with information about care guide support team and interviewed to confirm resource needs Follow up call placed to community resources to determine status of patients referral.  Follow Up Plan:  Care guide will follow up with patient by phone over the next days  Alois Cliche -[facility] Guide , Embedded Care Coordination [facility], Care Management  [phone] [address] [place] , [place] [address] Email : [email or web address]

## 2021-10-26 ENCOUNTER — Emergency Department (HOSPITAL_COMMUNITY): Payer: Medicare Other

## 2021-10-26 ENCOUNTER — Emergency Department (HOSPITAL_COMMUNITY)
Admission: EM | Admit: 2021-10-26 | Discharge: 2021-10-26 | Disposition: A | Discharge status: Home / Self Care | Payer: Medicare Other | Attending: Emergency Medicine | Admitting: Emergency Medicine

## 2021-10-26 ENCOUNTER — Other Ambulatory Visit: Payer: Self-pay

## 2021-10-26 DIAGNOSIS — Z79899 Other long term (current) drug therapy: Secondary | ICD-10-CM | POA: Insufficient documentation

## 2021-10-26 DIAGNOSIS — E1140 Type 2 diabetes mellitus with diabetic neuropathy, unspecified: Secondary | ICD-10-CM | POA: Diagnosis not present

## 2021-10-26 DIAGNOSIS — R200 Anesthesia of skin: Secondary | ICD-10-CM | POA: Diagnosis not present

## 2021-10-26 DIAGNOSIS — M6281 Muscle weakness (generalized): Secondary | ICD-10-CM | POA: Insufficient documentation

## 2021-10-26 DIAGNOSIS — M48061 Spinal stenosis, lumbar region without neurogenic claudication: Secondary | ICD-10-CM

## 2021-10-26 DIAGNOSIS — I10 Essential (primary) hypertension: Secondary | ICD-10-CM | POA: Diagnosis not present

## 2021-10-26 DIAGNOSIS — F17210 Nicotine dependence, cigarettes, uncomplicated: Secondary | ICD-10-CM | POA: Insufficient documentation

## 2021-10-26 DIAGNOSIS — R29898 Other symptoms and signs involving the musculoskeletal system: Secondary | ICD-10-CM | POA: Diagnosis not present

## 2021-10-26 DIAGNOSIS — Z21 Asymptomatic human immunodeficiency virus [HIV] infection status: Secondary | ICD-10-CM | POA: Diagnosis not present

## 2021-10-26 DIAGNOSIS — M79604 Pain in right leg: Secondary | ICD-10-CM

## 2021-10-26 DIAGNOSIS — E039 Hypothyroidism, unspecified: Secondary | ICD-10-CM | POA: Diagnosis not present

## 2021-10-26 DIAGNOSIS — R519 Headache, unspecified: Secondary | ICD-10-CM | POA: Diagnosis not present

## 2021-10-26 DIAGNOSIS — M25571 Pain in right ankle and joints of right foot: Secondary | ICD-10-CM | POA: Diagnosis not present

## 2021-10-26 DIAGNOSIS — Z96651 Presence of right artificial knee joint: Secondary | ICD-10-CM | POA: Insufficient documentation

## 2021-10-26 DIAGNOSIS — Z96643 Presence of artificial hip joint, bilateral: Secondary | ICD-10-CM | POA: Diagnosis not present

## 2021-10-26 DIAGNOSIS — M4316 Spondylolisthesis, lumbar region: Secondary | ICD-10-CM | POA: Diagnosis not present

## 2021-10-26 DIAGNOSIS — R531 Weakness: Secondary | ICD-10-CM | POA: Diagnosis not present

## 2021-10-26 LAB — CBC WITH DIFFERENTIAL/PLATELET
Abs Immature Granulocytes: 0.04 10*3/uL (ref 0.00–0.07)
Basophils Absolute: 0.1 10*3/uL (ref 0.0–0.1)
Basophils Relative: 1 %
Eosinophils Absolute: 0.1 10*3/uL (ref 0.0–0.5)
Eosinophils Relative: 1 %
HCT: 44.1 % (ref 36.0–46.0)
Hemoglobin: 14.4 g/dL (ref 12.0–15.0)
Immature Granulocytes: 1 %
Lymphocytes Relative: 26 %
Lymphs Abs: 2 10*3/uL (ref 0.7–4.0)
MCH: 30.8 pg (ref 26.0–34.0)
MCHC: 32.7 g/dL (ref 30.0–36.0)
MCV: 94.2 fL (ref 80.0–100.0)
Monocytes Absolute: 0.6 10*3/uL (ref 0.1–1.0)
Monocytes Relative: 8 %
Neutro Abs: 4.9 10*3/uL (ref 1.7–7.7)
Neutrophils Relative %: 63 %
Platelets: 183 10*3/uL (ref 150–400)
RBC: 4.68 MIL/uL (ref 3.87–5.11)
RDW: 13.3 % (ref 11.5–15.5)
WBC: 7.7 10*3/uL (ref 4.0–10.5)
nRBC: 0 % (ref 0.0–0.2)

## 2021-10-26 LAB — BASIC METABOLIC PANEL
Anion gap: 7 (ref 5–15)
BUN: 7 mg/dL — ABNORMAL LOW (ref 8–23)
CO2: 25 mmol/L (ref 22–32)
Calcium: 9.2 mg/dL (ref 8.9–10.3)
Chloride: 110 mmol/L (ref 98–111)
Creatinine, Ser: 0.59 mg/dL (ref 0.44–1.00)
GFR, Estimated: >60 mL/min (ref >60)
Glucose, Bld: 126 mg/dL — ABNORMAL HIGH (ref 70–99)
Potassium: 3.6 mmol/L (ref 3.5–5.1)
Sodium: 142 mmol/L (ref 135–145)

## 2021-10-26 MED ORDER — GADOBUTROL 1 MMOL/ML IV SOLN
9.0000 mL | Freq: Once | INTRAVENOUS | Status: AC | PRN
  Administered 2021-10-26: 9 mL via INTRAVENOUS

## 2021-10-26 NOTE — ED Provider Notes (Signed)
I spoke to the patient's son Eulalie Speights at [phone] who expresses frustration that the patient cannot be placed in a long-term care facility.  He reports concerns that she lives by herself and has had frequent falls at home, on a near weekly basis.  It is not clear to me or to him why they have not been able to get the patient placed, or to set up home health, although he has tried to do this with his PCP.  I advised that we can have social work consult on the issue.  The family can take the patient home tonight and can watch her short-term, although they will work and unable provide full term care.  We can provide a commode at discharge.  I have also ordered home health and PT evaluation.  I will reach out to their PCP as well to see if the office can further assist with placement options.   Terald Sleeper, MD 10/26/21 [phone]

## 2021-10-26 NOTE — Progress Notes (Signed)
CSW met with Pt at bedside. Per Pt she lives with niece. Pt states that she has not had HH services but is agreeable to having them.  °RNCM updated.   °RNCM will contact Pt tomorrow with HH information.  °
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
## 2021-10-26 NOTE — Progress Notes (Signed)
CSW spoke with son, Jerilynn Som, via telephone. Ann Maki expressed frustration due to fact that he feels that Pt needs in-home care services. CSW explained to son that referral in-home services covered by Medicaid have to come from PCP. Son became agitated and states that that is this CSW's job and this CSW needs to do something.  CSW explained that [facility] services will be sent to the home but that [facility] does not provide personal care such as assistance with toileting.  CSW will add in-home care resources to AVS.

## 2021-10-26 NOTE — ED Notes (Signed)
Pt to XR

## 2021-10-26 NOTE — ED Triage Notes (Addendum)
Patient arrives with complaints of new right leg numbness and pain x1 week. Patient can usually get around with no assistance, however, she has been getting weaker on the right side and needing more assistance. Lives at home with family   Patient has a hx of old stroke with left sided deficits and slurred speech.   EMS vitals:  188/118 BP 80 HR 157 CBG

## 2021-10-26 NOTE — ED Notes (Signed)
Pt to MRI

## 2021-10-26 NOTE — Social Work (Signed)
3-in-1 will be delivered to Pt room for d/c DME needs.

## 2021-10-26 NOTE — ED Notes (Signed)
Pt unable to go home until son off work at [employer]. Dr Renaye Rakers saying she needs BSC to go home with. This RN sent secure chat to SW for assistance.

## 2021-10-26 NOTE — Discharge Instructions (Addendum)
Your xray did not show a fracture or broken bone in your leg.  Your MRI did not show signs of stroke or spinal cord injury as a cause of pain or weakness in your leg.   Please follow up with your doctor for this issue.   Resource list for in-home assistance: [facility] [address] [place], [place]  [phone]  [facility] [address] [place], [place]  [phone]  [facility] [address] [place], [place]  [phone]  [facility] [address], [place], [place] [address] [phone]  [facility] [address], [place]  [phone]  [facility]  [address], [place], [place] [phone]  [facility] [address] [place]  [phone]  [facility] [address] [place], [address], [place], [place] [address] [phone] [facility] [address] Delorise Royals [place], [place] [address] [phone] [facility] [address], [place], [place] [address]  [phone] [facility] [address] Michela Pitcher [place], [place] [address]  [phone] [facility]  Healthcare Account Representative   [facility] [employer].  o: [phone]  e: [email or web address]

## 2021-10-26 NOTE — ED Notes (Signed)
Pt verbalized understanding of d/c instructions, meds, and followup care. Denies questions. VSS, no distress noted. Assisted into wheelchair x3 staff members. Brought to lobby and assisted into car with son.

## 2021-10-27 ENCOUNTER — Telehealth: Payer: Self-pay | Admitting: Student

## 2021-10-27 ENCOUNTER — Telehealth: Payer: Self-pay | Admitting: *Deleted

## 2021-10-27 NOTE — Telephone Encounter (Signed)
I as well as my attending Dr. Mayford Knife called Nicole [name] to discuss her emergency department visit yesterday evening as well as how she has been doing at home.  She was seen in the ED yesterday due to being concerned that she was unable to care for herself. Her work up was without new findings and MRI brain was without change. MRI spine showed significant lumbar spinal stenosis.     Patient states she has been continuing to live with her niece as well as her daughter, sharing time between them.  The patient still has difficulties with performing her ADLs as well as her IADLs and needs assistance with these.  She notes that she is able to stand however has difficulty ambulating due to right leg weakness and periodic pain.  She has physical therapy appointment coming up on Monday the 19th, if they recommend a short-term rehab facility, patient is agreeable to this.  She is hesitant on the long-term facility at this time she would like to see how she does at daily rehab sessions with a SNF.  Patient believes that she can and will get better.  She denies any red flag symptoms such as fever, loss of bowel or bladder function, rectal numbness or tingling or any falls.  I discussed the above with the patient and she was able to tell me back what we discussed.  I also called patient's niece Nicole [name] per her request.  Nicole [name] noted that there is some days where the patient is weaker than others and agrees that patient should be in a short-term care facility if recommended by physical therapy and Occupational Therapy on Monday.

## 2021-10-27 NOTE — Telephone Encounter (Signed)
Nicole [name] [name], RN, BSN, [place] [phone] RNCM spoke with pt via telephone regarding discharge planning for Home Health Services. Offered pt medicare.gov list of home health agencies to choose from.  Pt chose [facility] to render services. Amy Hyatt of [facility] notified. Patient made aware that [facility] will be in contact in 24-48 hours.

## 2021-10-27 NOTE — Telephone Encounter (Signed)
Call placed to [facility] for status update on PCS. Waited on hold x 15 minutes without anyone picking up. Will forward to Lela to try again on 12/19.

## 2021-10-30 ENCOUNTER — Ambulatory Visit: Payer: Medicare Other | Attending: Internal Medicine

## 2021-10-30 ENCOUNTER — Other Ambulatory Visit: Payer: Self-pay

## 2021-10-30 ENCOUNTER — Telehealth: Payer: Self-pay | Admitting: *Deleted

## 2021-10-30 ENCOUNTER — Ambulatory Visit: Payer: Medicare Other | Admitting: Occupational Therapy

## 2021-10-30 DIAGNOSIS — M6281 Muscle weakness (generalized): Secondary | ICD-10-CM

## 2021-10-30 DIAGNOSIS — I6930 Unspecified sequelae of cerebral infarction: Secondary | ICD-10-CM | POA: Insufficient documentation

## 2021-10-30 NOTE — Telephone Encounter (Signed)
DR Cira Servant IS AWARE THAT PATIENT SEEN 10-30-2021 / NURSE VISIT.

## 2021-10-30 NOTE — Therapy (Signed)
[facility] [address] [place], [place], [address] Phone: [phone]   Fax:  [phone]  Patient Details  Name: QUADASIA NEWSHAM MRN: [id number] Date of Birth: 1961-08-26 Referring Provider:  Reymundo Poll, MD  Encounter Date: 10/30/2021  Arrived no charge- PT went out to car to speak to patient as family was having trouble getting her out of car. Having reviewed chart, PT discussed with pt and family their current needs. Niece reports that her aunt has been staying with her since February and she is struggling to care for her. She needs a lot of help to transfer. Has been to ED recently. PT discussed that those ED notes recommended home health and nurse conversation stated that home health was ordered there [facility]. Did see where PCP had told her to come to this outpatient PT eval and then would decide on SNF placement. Pt explained that per chart and in discussion with pt and family that outpatient was not best option for pt as we can not help at all in home. If can get home health started they could at least start care there and then assist with transfer as long as Med A waiver for Covid was still in affect for SNF placement. If not, use to require 3 day hospital stay. PT provided niece, Jasmine December, with numbers for the care management from the notes and niece called [facility] while therapist present and they do have referral for pt and will be calling soon. PT did not proceed with outpatient eval and not appropriate for level of care pt needs at this time.  Ronn Melena, PT, DPT, NCS 10/30/2021, 1:39 PM  [facility] [address] [place], [place], [address] Phone: [phone]   Fax:  [phone]

## 2021-10-30 NOTE — Telephone Encounter (Signed)
CALLED PATIENT TO FOLLOW UP ON STATUS OF REFERRAL. PATIENT HAD NURSE VISIT THIS MORNING (10-30-2021). DR Cira Servant IS AWARE OF THIS.

## 2021-10-31 ENCOUNTER — Telehealth: Payer: Self-pay

## 2021-10-31 ENCOUNTER — Emergency Department (HOSPITAL_COMMUNITY): Payer: Medicare Other

## 2021-10-31 ENCOUNTER — Other Ambulatory Visit: Payer: Self-pay

## 2021-10-31 ENCOUNTER — Emergency Department (HOSPITAL_BASED_OUTPATIENT_CLINIC_OR_DEPARTMENT_OTHER): Payer: Medicare Other

## 2021-10-31 ENCOUNTER — Emergency Department (HOSPITAL_COMMUNITY)
Admission: EM | Admit: 2021-10-31 | Discharge: 2021-11-02 | Disposition: A | Discharge status: Home / Self Care | Payer: Medicare Other | Attending: Emergency Medicine | Admitting: Emergency Medicine

## 2021-10-31 ENCOUNTER — Encounter (HOSPITAL_COMMUNITY): Payer: Self-pay | Admitting: Emergency Medicine

## 2021-10-31 DIAGNOSIS — Z96653 Presence of artificial knee joint, bilateral: Secondary | ICD-10-CM | POA: Diagnosis not present

## 2021-10-31 DIAGNOSIS — M25561 Pain in right knee: Secondary | ICD-10-CM | POA: Diagnosis not present

## 2021-10-31 DIAGNOSIS — Z7982 Long term (current) use of aspirin: Secondary | ICD-10-CM | POA: Diagnosis not present

## 2021-10-31 DIAGNOSIS — F17210 Nicotine dependence, cigarettes, uncomplicated: Secondary | ICD-10-CM | POA: Diagnosis not present

## 2021-10-31 DIAGNOSIS — M7989 Other specified soft tissue disorders: Secondary | ICD-10-CM | POA: Diagnosis not present

## 2021-10-31 DIAGNOSIS — Z79899 Other long term (current) drug therapy: Secondary | ICD-10-CM | POA: Insufficient documentation

## 2021-10-31 DIAGNOSIS — Z96643 Presence of artificial hip joint, bilateral: Secondary | ICD-10-CM | POA: Insufficient documentation

## 2021-10-31 DIAGNOSIS — E119 Type 2 diabetes mellitus without complications: Secondary | ICD-10-CM | POA: Insufficient documentation

## 2021-10-31 DIAGNOSIS — R531 Weakness: Secondary | ICD-10-CM | POA: Insufficient documentation

## 2021-10-31 DIAGNOSIS — W19XXXA Unspecified fall, initial encounter: Secondary | ICD-10-CM | POA: Diagnosis not present

## 2021-10-31 DIAGNOSIS — M79604 Pain in right leg: Secondary | ICD-10-CM | POA: Insufficient documentation

## 2021-10-31 DIAGNOSIS — I10 Essential (primary) hypertension: Secondary | ICD-10-CM | POA: Insufficient documentation

## 2021-10-31 DIAGNOSIS — Z20822 Contact with and (suspected) exposure to covid-19: Secondary | ICD-10-CM | POA: Diagnosis not present

## 2021-10-31 DIAGNOSIS — Z96651 Presence of right artificial knee joint: Secondary | ICD-10-CM | POA: Diagnosis not present

## 2021-10-31 LAB — CBC WITH DIFFERENTIAL/PLATELET
Abs Immature Granulocytes: 0.03 10*3/uL (ref 0.00–0.07)
Basophils Absolute: 0 10*3/uL (ref 0.0–0.1)
Basophils Relative: 0 %
Eosinophils Absolute: 0.1 10*3/uL (ref 0.0–0.5)
Eosinophils Relative: 1 %
HCT: 44.4 % (ref 36.0–46.0)
Hemoglobin: 14.3 g/dL (ref 12.0–15.0)
Immature Granulocytes: 0 %
Lymphocytes Relative: 23 %
Lymphs Abs: 2.3 10*3/uL (ref 0.7–4.0)
MCH: 31.4 pg (ref 26.0–34.0)
MCHC: 32.2 g/dL (ref 30.0–36.0)
MCV: 97.6 fL (ref 80.0–100.0)
Monocytes Absolute: 0.7 10*3/uL (ref 0.1–1.0)
Monocytes Relative: 7 %
Neutro Abs: 7.1 10*3/uL (ref 1.7–7.7)
Neutrophils Relative %: 69 %
Platelets: 180 10*3/uL (ref 150–400)
RBC: 4.55 MIL/uL (ref 3.87–5.11)
RDW: 13.3 % (ref 11.5–15.5)
WBC: 10.2 10*3/uL (ref 4.0–10.5)
nRBC: 0 % (ref 0.0–0.2)

## 2021-10-31 LAB — BASIC METABOLIC PANEL
Anion gap: 8 (ref 5–15)
BUN: 9 mg/dL (ref 8–23)
CO2: 20 mmol/L — ABNORMAL LOW (ref 22–32)
Calcium: 9.1 mg/dL (ref 8.9–10.3)
Chloride: 111 mmol/L (ref 98–111)
Creatinine, Ser: 0.62 mg/dL (ref 0.44–1.00)
GFR, Estimated: >60 mL/min (ref >60)
Glucose, Bld: 122 mg/dL — ABNORMAL HIGH (ref 70–99)
Potassium: 3.8 mmol/L (ref 3.5–5.1)
Sodium: 139 mmol/L (ref 135–145)

## 2021-10-31 MED ORDER — GABAPENTIN 300 MG PO CAPS
300.0000 mg | ORAL_CAPSULE | Freq: Once | ORAL | Status: AC
  Administered 2021-10-31: 22:00:00 300 mg via ORAL
  Filled 2021-10-31: qty 1

## 2021-10-31 MED ORDER — HYDROCODONE-ACETAMINOPHEN 5-325 MG PO TABS
1.0000 | ORAL_TABLET | Freq: Once | ORAL | Status: AC
  Administered 2021-10-31: 22:00:00 1 via ORAL
  Filled 2021-10-31: qty 1

## 2021-10-31 MED ORDER — OXYCODONE-ACETAMINOPHEN 5-325 MG PO TABS
1.0000 | ORAL_TABLET | Freq: Once | ORAL | Status: AC
  Administered 2021-10-31: 11:00:00 1 via ORAL
  Filled 2021-10-31: qty 1

## 2021-10-31 NOTE — ED Notes (Signed)
Pt transported to US at this time. 

## 2021-10-31 NOTE — Telephone Encounter (Signed)
Return call to pt's niece, Benn Moulder. Stated pt is in excruciating pain and unable to walk. No mobility of her right side. Stated the nurse from [facility] came yesterday for initial assessment - stated she needs PT/OT/SW. Stated the family is unable to provide the care pt needs. Stated she will call EMS to bring pt to the ED; stated the family will not be able to bring pt back home.

## 2021-10-31 NOTE — Progress Notes (Signed)
Patient is in the waiting room and has not been assigned a room. TOC will follow up with ED provider once patient is assigned a room.

## 2021-10-31 NOTE — ED Notes (Signed)
No care handoff was done for this patient.

## 2021-10-31 NOTE — ED Triage Notes (Signed)
Patient here for right leg pain. Pain has been ongoing for several months now. Pt with hx of old stroke with reduced mobility. VSS. NAD.
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
## 2021-10-31 NOTE — ED Notes (Signed)
PA informed pt is in pain.  She states she will order pain meds

## 2021-10-31 NOTE — Progress Notes (Signed)
Attempted lower extremity venous duplex. Patient unable to transfer from wheelchair to bed safely. Will try again once patient is in a stretcher.   Aundra Millet Sarp Vernier 10/31/2021 11:45 AM

## 2021-10-31 NOTE — Telephone Encounter (Signed)
Pt family member is requesting a call back ... she is all over the place with what is needed

## 2021-10-31 NOTE — Progress Notes (Signed)
Lower extremity venous RT study completed.  Preliminary results relayed to [place], [place] via secure chat.  See CV Proc for preliminary results report.   Jean Rosenthal, RDMS, RVT

## 2021-10-31 NOTE — Progress Notes (Signed)
CSW sent FL2 referral to several area SNFs.  PT/OT eval still needed

## 2021-10-31 NOTE — ED Notes (Signed)
Pt has been sobbing in the hallway crying out about 10/10 leg pain.  Hot packs have ben attempted.   EDP has ordered pain meds,.

## 2021-10-31 NOTE — NC FL2 (Signed)
[place] MEDICAID FL2 LEVEL OF CARE SCREENING TOOL     IDENTIFICATION  Patient Name: Nicole [name] Birthdate: Sep 29, 1961 Sex: female Admission Date (Current Location): 10/31/2021  [facility] and [place] Number:  Producer, television/film/video and Address:  [facility] [place]. [facility], [address], [place], [place] [address]      Provider Number: [id number]  Attending Physician Name and Address:  Tegeler, Canary Brim, *  Relative Name and Phone Number:  Benn Moulder, Niece, [phone]    Current Level of Care: Hospital Recommended Level of Care: Skilled Nursing Facility Prior Approval Number:    Date Approved/Denied:   PASRR Number: [id number]  Discharge Plan: SNF    Current Diagnoses: Patient Active Problem List   Diagnosis Date Noted   Chronic cerebrovascular accident (CVA) 10/10/2021   Right leg pain 10/10/2021   Stress at home 10/10/2021   Cellulitis, face 05/22/2021   Cramping of hands 03/15/2021   GERD (gastroesophageal reflux disease) 03/15/2021   Neuropathy 12/02/2020   Diabetes (HCC) 08/16/2017   Poor dentition 06/18/2017   Healthcare maintenance 11/18/2014   Bilateral lower extremity edema 05/19/2014   Chronic cough 08/03/2013   History of hypothyroidism 09/16/2012   HLD (hyperlipidemia) 12/12/2009   Obesity, Class II, BMI 35-39.9 11/30/2008   Smokes tobacco daily 02/24/2007   Essential hypertension, benign 02/24/2007   Allergic rhinitis, cause unspecified 02/24/2007   Osteoarthritis 02/24/2007   Human immunodeficiency virus (HIV) disease (HCC) 11/12/2000   History of hepatitis C 11/12/2000    Orientation RESPIRATION BLADDER Height & Weight     Self, Time, Situation, Place  Normal Continent Weight:   Height:     BEHAVIORAL SYMPTOMS/MOOD NEUROLOGICAL BOWEL NUTRITION STATUS      Continent Diet (diabetic friendly)  AMBULATORY STATUS COMMUNICATION OF NEEDS Skin   Extensive Assist Verbally Bruising (left leg bruising)                        Personal Care Assistance Level of Assistance  Bathing, Feeding, Dressing Bathing Assistance: Limited assistance Feeding assistance: Independent Dressing Assistance: Limited assistance     Functional Limitations Info  Sight, Hearing, Speech Sight Info: Adequate Hearing Info: Adequate Speech Info: Adequate    SPECIAL CARE FACTORS FREQUENCY                       Contractures      Additional Factors Info  Code Status, Allergies Code Status Info: Full code Allergies Info: Cephalexin           Current Medications (10/31/2021):  This is the current hospital active medication list No current facility-administered medications for this encounter.   Current Outpatient Medications  Medication Sig Dispense Refill   ACCU-CHEK FASTCLIX LANCETS MISC Check blood sugar one time a day 102 each 5   aspirin EC 81 MG tablet Take 81 mg by mouth daily. Swallow whole.     buPROPion (WELLBUTRIN SR) 150 MG 12 hr tablet TAKE 1 TABLET BY MOUTH EVERY DAY 90 tablet 1   FARXIGA 10 MG TABS tablet TAKE 1 TABLET BY MOUTH EVERY DAY BEFORE BREAKFAST 30 tablet 3   gabapentin (NEURONTIN) 300 MG capsule Take 1 capsule (300 mg total) by mouth at bedtime. (Patient taking differently: Take 300 mg by mouth 2 (two) times daily.) 90 capsule 3   GENVOYA 150-150-200-10 MG TABS tablet TAKE ONE TABLET BY MOUTH ONCE DAILY WITH BREAKFAST. STORE IN ORIGINAL CONTAINER AT ROOM TEMPERATURE. 30 tablet 4  glucose blood (ACCU-CHEK GUIDE) test strip CHECK BLOOD SUGAR 1 TIME DAILY DIAG CODE E11.9 NONINSULIN DEPENDENT 100 strip 1   glucose blood (ACCU-CHEK GUIDE) test strip Check blood sugar one time daily 100 each 4   halobetasol (ULTRAVATE) 0.05 % ointment Apply to the affected area twice daily (Patient not taking: Reported on 11/30/2020) 30 g 0   losartan-hydrochlorothiazide (HYZAAR) 50-12.5 MG tablet Take 1 tablet by mouth daily. 90 tablet 3   nicotine polacrilex (NICORETTE) 2 MG gum Take 1 each (2 mg total) by mouth  as needed for smoking cessation. (Patient not taking: Reported on 11/30/2020) 100 tablet 0   omeprazole (PRILOSEC OTC) 20 MG tablet Take 2 tablets (40 mg total) by mouth daily. (Patient taking differently: Take 20 mg by mouth daily.) 28 tablet 1   OZEMPIC, 0.25 OR 0.5 MG/DOSE, 2 MG/1.5ML SOPN INJECT 0.25 MG INTO THE SKIN ONCE A WEEK. AFTER 4 WEEKS, INCREASE TO 0.5 MG WEEKLY. (Patient taking differently: Inject 0.5 mg into the skin once a week.) 1.5 mL 1   potassium chloride SA (KLOR-CON M20) 20 MEQ tablet Take 1 tablet (20 mEq total) by mouth daily. (Patient not taking: Reported on 05/01/2021) 90 tablet 0   rosuvastatin (CRESTOR) 20 MG tablet Take 1 tablet (20 mg total) by mouth daily. 90 tablet 1     Discharge Medications: Please see discharge summary for a list of discharge medications.  Relevant Imaging Results:  Relevant Lab Results:   Additional Information SS [id number]/ Pt has has 3 Covid shots  [place] Francisco, LCSW

## 2021-10-31 NOTE — ED Provider Notes (Signed)
Emergency Medicine Provider Triage Evaluation Note  Nicole [name] , a 60 y.o. female  was evaluated in triage.  Pt complains of ongoing right leg pain and weakness for several months.  It is thought to be due to old stroke.  Patient has been seen in the ED multiple times for same.  She had a negative DVT study on 11/20 and recently had MRI brain as well as MRI L-spine no acute findings of stroke.  L-spine MRI did show some facet degeneration and severe spinal stenosis from L1-2 and L4-L5.  During last ED visit home PT, OT, home health was ordered.  It appears that they went out today however patient had worsening pain and her family decided to call EMS.  Per EMS family is refusing to take patient home at this time and wants placement as they are overwhelmed.  Patient does report that she is having new onset right leg swelling.  She needs to cry out in pain .  Review of Systems  Positive: + right leg pain/swelling, weakness  Negative: - chest pain, SOB  Physical Exam  BP (!) 141/103 (BP Location: Left Arm)    Pulse 73    Temp 98.8 F (37.1 C) (Oral)    Resp (!) 22    SpO2 100%  Gen:   Awake, no distress   Resp:  Normal effort  MSK:   Moves extremities without difficulty  Other:  RLE swelling compared to LLE. Diffuse TTP throughout entire RLE however difficult to truly assess given pt continues to cry. 2+ DP pulse  Medical Decision Making  Medically screening exam initiated at 10:58 AM.  Appropriate orders placed.  LONIA ROANE was informed that the remainder of the evaluation will be completed by another provider, this initial triage assessment does not replace that evaluation, and the importance of remaining in the ED until their evaluation is complete.     Tanda Rockers, PA-C 10/31/21 1101    Gerhard Munch, MD 10/31/21 1233

## 2021-10-31 NOTE — ED Notes (Signed)
Gave patient heat packs for leg pain

## 2021-11-01 ENCOUNTER — Emergency Department (HOSPITAL_COMMUNITY): Payer: Medicare Other

## 2021-11-01 DIAGNOSIS — Z96651 Presence of right artificial knee joint: Secondary | ICD-10-CM | POA: Diagnosis not present

## 2021-11-01 DIAGNOSIS — M79604 Pain in right leg: Secondary | ICD-10-CM | POA: Diagnosis not present

## 2021-11-01 DIAGNOSIS — M25561 Pain in right knee: Secondary | ICD-10-CM | POA: Diagnosis not present

## 2021-11-01 LAB — RESP PANEL BY RT-PCR (FLU A&B, COVID) ARPGX2
Influenza A by PCR: NEGATIVE
Influenza B by PCR: NEGATIVE
SARS Coronavirus 2 by RT PCR: NEGATIVE

## 2021-11-01 MED ORDER — ACETAMINOPHEN 325 MG PO TABS
650.0000 mg | ORAL_TABLET | ORAL | Status: DC | PRN
  Filled 2021-11-01 (×3): qty 2

## 2021-11-01 MED ORDER — IBUPROFEN 400 MG PO TABS
600.0000 mg | ORAL_TABLET | Freq: Three times a day (TID) | ORAL | Status: DC | PRN
  Filled 2021-11-01 (×2): qty 1

## 2021-11-01 NOTE — ED Notes (Signed)
Pt screaming, hollering and moaning in hallway. Pt given tylenol and ibuprofen for pain. Pt no longer screaming after giving pt meds. Pt in NAD.

## 2021-11-01 NOTE — ED Provider Notes (Signed)
I was informed by nurse and physical therapy that the patient was unable to stand up and the physical therapist was concerned perhaps she had a hip dislocation.  On my exam, she complains of diffuse right leg pain that she cannot localize it.  Chart review indicates this seems to be a chronic problem.  I will go ahead and get some x-rays but she has had a work-up recently including MRI of her back and there is no weakness or numbness in her leg.  Xrays unremarkable besides small joint effusion. No clear pathology for why she is having such severe pain.   Pricilla Loveless, MD 11/01/21 1536

## 2021-11-01 NOTE — Progress Notes (Signed)
Patient currently has 2 bed offers. CSW is waiting for PT/OT to see patient so recommendations can be reviewed to determine if patient will qualify for SNF placement.

## 2021-11-01 NOTE — ED Notes (Signed)
Transferred patient to hospital bed

## 2021-11-01 NOTE — ED Notes (Signed)
Pt noted to be moaning and screaming every time someone passes her in hallway.

## 2021-11-01 NOTE — ED Notes (Signed)
Changed patients brief and put patient on bed pan

## 2021-11-01 NOTE — Evaluation (Signed)
Occupational Therapy Evaluation Patient Details Name: Nicole [name] MRN: [id number] DOB: March 07, 1961 Today's Date: 11/01/2021   History of Present Illness Patient is a 60 y/o female presenting with R LE pain and weakness and onld L LE weakness from CVA with difficulty ambulating.  Patient had PT ordered, but has not been able to attend and has had worsening pain.  Family unable to care for her.  PMH positive for prior R CVA, multiple joint replacements, HTN, DM, obesity, OA, HIV and hypothyroid.   Clinical Impression   PTA patient reports independent with ADLs and mobility using RW, but requiring increased assist due to pain in R LE. Questionable historian. Limited today by R LE pain, impaired balance, weakness, decreased functional use of L UE due to chronic CVA.  Patient currently requires mod- total assist +2 for bed mobility, ADLs with up to total assist.  Believe she will benefit from further OT services while admitted and after dc at SNF level to optimize independence, safety and return to PLOF. Will follow.      Recommendations for follow up therapy are one component of a multi-disciplinary discharge planning process, led by the attending physician.  Recommendations may be updated based on patient status, additional functional criteria and insurance authorization.   Follow Up Recommendations  Skilled nursing-short term rehab (<3 hours/day)    Assistance Recommended at Discharge Frequent or constant Supervision/Assistance  Functional Status Assessment  Patient has had a recent decline in their functional status and demonstrates the ability to make significant improvements in function in a reasonable and predictable amount of time.  Equipment Recommendations  BSC/3in1    Recommendations for Other Services       Precautions / Restrictions Precautions Precautions: Fall Precaution Comments: severe R LE pain      Mobility Bed Mobility Overal bed mobility: Needs Assistance Bed  Mobility: Supine to Sit;Sit to Supine;Rolling Rolling: Mod assist;Max assist   Supine to sit: Mod assist;+2 for physical assistance Sit to supine: Total assist;+2 for physical assistance   General bed mobility comments: assist for legs off EOB and min a to elevate trunk from stretcher in hallway in ED; to supine total A due to pt on very edge of stretcher and sliding forward; in supine rolled to change brief, pt fearful rolling L and increased assist needed    Transfers Overall transfer level: Needs assistance Equipment used: Rolling walker (2 wheels);None Transfers: Sit to/from Stand Sit to Stand: +2 physical assistance;Total assist;Max assist           General transfer comment: attempted sit to stand x 2 to RW without succes due to crouch posture and difficulty with LE weight bearing, assisted from front no walker and knees blocked with max A, but unable to tolerate long so sat on EOB and pt too near edge so returned to supine for safety      Balance Overall balance assessment: Needs assistance Sitting-balance support: Feet unsupported Sitting balance-Leahy Scale: Fair Sitting balance - Comments: once stablized on EOB able to balance even without LE support     Standing balance-Leahy Scale: Zero Standing balance comment: max to total A for crouched standing briefly                           ADL either performed or assessed with clinical judgement   ADL Overall ADL's : Needs assistance/impaired     Grooming: Minimal assistance;Sitting  Upper Body Dressing : Sitting;Moderate assistance   Lower Body Dressing: Total assistance;Sitting/lateral leans;Bed level;+2 for physical assistance;+2 for safety/equipment     Toilet Transfer Details (indicate cue type and reason): unable Toileting- Clothing Manipulation and Hygiene: Total assistance;+2 for safety/equipment;Sitting/lateral lean;Bed level       Functional mobility during ADLs: Maximal  assistance;Total assistance       Vision         Perception     Praxis      Pertinent Vitals/Pain Pain Assessment: 0-10 Pain Score: 8  Pain Location: R LE Pain Descriptors / Indicators: Aching;Grimacing;Guarding;Moaning Pain Intervention(s): Limited activity within patient's tolerance;Monitored during session;Repositioned     Hand Dominance Right   Extremity/Trunk Assessment Upper Extremity Assessment Upper Extremity Assessment: LUE deficits/detail LUE Deficits / Details: hx of L UE deficits from CVA, hand positioned in flexion with limited grasp. LUE Coordination: decreased fine motor   Lower Extremity Assessment Lower Extremity Assessment: Defer to PT evaluation RLE Deficits / Details: AAROM limited with pain at foot to hip; limited active ankle movement and pain with passive movement, can help to flex knee some, but not lift up antigravity in sitting RLE: Unable to fully assess due to pain LLE Deficits / Details: able to extend knee AAROM with increased tone into flexion, limited knee extension antigravity and limited ankle DF   Cervical / Trunk Assessment Cervical / Trunk Assessment: Other exceptions Cervical / Trunk Exceptions: tight on L side from prior stroke   Communication Communication Communication: No difficulties   Cognition Arousal/Alertness: Awake/alert Behavior During Therapy: WFL for tasks assessed/performed Overall Cognitive Status: Within Functional Limits for tasks assessed                                       General Comments  Patient fearful of falling and tentative to attempt standing and even fearful rolling in bed for changing brief.    Exercises     Shoulder Instructions      Home Living Family/patient expects to be discharged to:: Skilled nursing facility                                 Additional Comments: was at home living with neice      Prior Functioning/Environment Prior Level of Function :  Needs assist;Patient poor historian/Family not available       Physical Assist : Mobility (physical);ADLs (physical)     Mobility Comments: prior to leg pain was living alone, since reports living with her neice and having difficulty with mobility ADLs Comments: prior to leg pain reports independent in ADLs        OT Problem List: Decreased strength;Decreased activity tolerance;Impaired balance (sitting and/or standing);Decreased safety awareness;Decreased knowledge of use of DME or AE;Decreased knowledge of precautions;Decreased coordination;Decreased range of motion;Impaired tone;Impaired UE functional use;Pain      OT Treatment/Interventions: Self-care/ADL training;Therapeutic exercise;DME and/or AE instruction;Therapeutic activities;Patient/family education;Balance training    OT Goals(Current goals can be found in the care plan section) Acute Rehab OT Goals Patient Stated Goal: less pain OT Goal Formulation: With patient Time For Goal Achievement: 11/15/21 Potential to Achieve Goals: Fair  OT Frequency: Min 2X/week   Barriers to D/C:            Co-evaluation PT/OT/SLP Co-Evaluation/Treatment: Yes Reason for Co-Treatment: For patient/therapist safety;To address functional/ADL transfers PT goals addressed during  session: Mobility/safety with mobility;Balance OT goals addressed during session: ADL's and self-care      AM-PAC OT "6 Clicks" Daily Activity     Outcome Measure Help from another person eating meals?: A Little Help from another person taking care of personal grooming?: A Little Help from another person toileting, which includes using toliet, bedpan, or urinal?: Total Help from another person bathing (including washing, rinsing, drying)?: A Lot Help from another person to put on and taking off regular upper body clothing?: A Lot Help from another person to put on and taking off regular lower body clothing?: Total 6 Click Score: 12   End of Session Equipment  Utilized During Treatment: Rolling walker (2 wheels) Nurse Communication: Mobility status  Activity Tolerance: Patient tolerated treatment well;Patient limited by pain Patient left: in bed  OT Visit Diagnosis: Other abnormalities of gait and mobility (R26.89);Pain;Muscle weakness (generalized) (M62.81) Pain - Right/Left: Right Pain - part of body: Leg                Time: 1050-1117 OT Time Calculation (min): 27 min Charges:  OT General Charges $OT Visit: 1 Visit OT Evaluation $OT Eval Moderate Complexity: 1 Mod  Barry Brunner, OT Acute Rehabilitation Services Pager [phone] Office [phone]   Chancy Milroy 11/01/2021, 1:53 PM

## 2021-11-01 NOTE — Progress Notes (Signed)
CSW spoke with patient and her sister Benn Moulder and provided them with [facility], [facility], and [facility] for SNF placement. Family chose [facility].    CSW contacted Olegario Messier with admissions at [facility] to verify offer. Olegario Messier stated they can accept patient tomorrow.

## 2021-11-01 NOTE — Evaluation (Addendum)
Physical Therapy Evaluation Patient Details Name: Nicole [name] MRN: [id number] DOB: 1961/09/23 Today's Date: 11/01/2021  History of Present Illness  Patient is a 60 y/o female presenting with R LE pain and weakness and onld L LE weakness from CVA with difficulty ambulating.  Patient had PT ordered, but has not been able to attend and has had worsening pain.  Family unable to care for her.  PMH positive for prior R CVA, multiple joint replacements, HTN, DM, obesity, OA, HIV and hypothyroid.  Clinical Impression  Patient presents with decreased mobility due to pain and weakness in both LE's.  Currently mod A of 2 for supine to sit and max to total A for attempts to stand.  She has been unable to ambulate recently and demonstrates R hip internal rotation and pain with all movements.  RN made aware, but feel she will need SNF level rehab at d/c.        Recommendations for follow up therapy are one component of a multi-disciplinary discharge planning process, led by the attending physician.  Recommendations may be updated based on patient status, additional functional criteria and insurance authorization.  Follow Up Recommendations Skilled nursing-short term rehab (<3 hours/day)    Assistance Recommended at Discharge Frequent or constant Supervision/Assistance  Functional Status Assessment Patient has had a recent decline in their functional status and demonstrates the ability to make significant improvements in function in a reasonable and predictable amount of time.  Equipment Recommendations  None recommended by PT    Recommendations for Other Services       Precautions / Restrictions Precautions Precautions: Fall Precaution Comments: severe R LE pain      Mobility  Bed Mobility Overal bed mobility: Needs Assistance Bed Mobility: Supine to Sit;Sit to Supine;Rolling Rolling: Mod assist;Max assist   Supine to sit: Mod assist;+2 for physical assistance Sit to supine: Total assist;+2  for physical assistance   General bed mobility comments: assist for legs off EOB and min a to elevate trunk from stretcher in hallway in ED; to supine total A due to pt on very edge of stretcher and sliding forward; in supine rolled to change brief, pt fearful rolling L and increased assist needed    Transfers Overall transfer level: Needs assistance Equipment used: Rolling walker (2 wheels);None Transfers: Sit to/from Stand Sit to Stand: +2 physical assistance;Total assist;Max assist           General transfer comment: attempted sit to stand x 2 to RW without succes due to crouch posture and difficulty with LE weight bearing, assisted from front no walker and knees blocked with max A, but unable to tolerate long so sat on EOB and pt too near edge so returned to supine for safety    Ambulation/Gait               General Gait Details: unable  Stairs            Wheelchair Mobility    Modified Rankin (Stroke Patients Only)       Balance Overall balance assessment: Needs assistance Sitting-balance support: Feet unsupported Sitting balance-Leahy Scale: Fair Sitting balance - Comments: once stablized on EOB able to balance even without LE support (initially leaning back till oriented to upright)     Standing balance-Leahy Scale: Zero Standing balance comment: max to total A for crouched standing briefly  Pertinent Vitals/Pain Pain Assessment: 0-10 Pain Score: 8  Pain Location: R LE Pain Descriptors / Indicators: Aching;Grimacing;Guarding;Moaning Pain Intervention(s): Monitored during session;Repositioned;Limited activity within patient's tolerance    Home Living Family/patient expects to be discharged to:: Skilled nursing facility                   Additional Comments: was at home living with neice    Prior Function Prior Level of Function : Needs assist       Physical Assist : Mobility (physical);ADLs  (physical)     Mobility Comments: prior to leg pain was living alone, since reports living with her neice and having difficulty with mobility       Hand Dominance   Dominant Hand: Right    Extremity/Trunk Assessment   Upper Extremity Assessment Upper Extremity Assessment: Defer to OT evaluation    Lower Extremity Assessment Lower Extremity Assessment: RLE deficits/detail;LLE deficits/detail RLE Deficits / Details: AAROM limited with pain at foot to hip; limited active ankle movement and pain with passive movement, can help to flex knee some, but not lift up antigravity in sitting RLE: Unable to fully assess due to pain LLE Deficits / Details: able to extend knee AAROM with increased tone into flexion, limited knee extension antigravity and limited ankle DF    Cervical / Trunk Assessment Cervical / Trunk Assessment: Other exceptions Cervical / Trunk Exceptions: tight on L side from prior stroke  Communication   Communication: No difficulties  Cognition Arousal/Alertness: Awake/alert Behavior During Therapy: WFL for tasks assessed/performed Overall Cognitive Status: Within Functional Limits for tasks assessed                                          General Comments General comments (skin integrity, edema, etc.): Patient fearful of falling and tentative to attempt standing and even fearful rolling in bed for changing brief.    Exercises     Assessment/Plan    PT Assessment Patient needs continued PT services  PT Problem List Decreased strength;Decreased mobility;Decreased safety awareness;Pain;Decreased balance;Decreased activity tolerance       PT Treatment Interventions DME instruction;Therapeutic activities;Therapeutic exercise;Patient/family education;Balance training;Functional mobility training    PT Goals (Current goals can be found in the Care Plan section)  Acute Rehab PT Goals Patient Stated Goal: family wants placement PT Goal  Formulation: With patient/family Time For Goal Achievement: 11/15/21 Potential to Achieve Goals: Fair    Frequency Min 2X/week   Barriers to discharge Decreased caregiver support      Co-evaluation PT/OT/SLP Co-Evaluation/Treatment: Yes Reason for Co-Treatment: For patient/therapist safety PT goals addressed during session: Mobility/safety with mobility;Balance         AM-PAC PT "6 Clicks" Mobility  Outcome Measure Help needed turning from your back to your side while in a flat bed without using bedrails?: A Lot Help needed moving from lying on your back to sitting on the side of a flat bed without using bedrails?: Total Help needed moving to and from a bed to a chair (including a wheelchair)?: Total Help needed standing up from a chair using your arms (e.g., wheelchair or bedside chair)?: Total Help needed to walk in hospital room?: Total Help needed climbing 3-5 steps with a railing? : Total 6 Click Score: 7    End of Session Equipment Utilized During Treatment: Gait belt Activity Tolerance: Patient limited by pain Patient left: in bed  PT Visit Diagnosis: Other abnormalities of gait and mobility (R26.89);Muscle weakness (generalized) (M62.81);Pain;History of falling (Z91.81) Pain - Right/Left: Right Pain - part of body: Leg    Time: 1050-1117 PT Time Calculation (min) (ACUTE ONLY): 27 min   Charges:   PT Evaluation $PT Eval Moderate Complexity: 1 Mod          Cyndi Filippi, PT Acute Rehabilitation Services Pager:[phone] Office:[phone] 11/01/2021   Bethani Brugger 11/01/2021, 1:30 PM

## 2021-11-02 ENCOUNTER — Ambulatory Visit (HOSPITAL_BASED_OUTPATIENT_CLINIC_OR_DEPARTMENT_OTHER): Payer: Medicare Other

## 2021-11-02 ENCOUNTER — Ambulatory Visit (HOSPITAL_BASED_OUTPATIENT_CLINIC_OR_DEPARTMENT_OTHER): Admission: RE | Admit: 2021-11-02 | Payer: Medicare Other | Source: Ambulatory Visit

## 2021-11-02 DIAGNOSIS — M1611 Unilateral primary osteoarthritis, right hip: Secondary | ICD-10-CM | POA: Diagnosis not present

## 2021-11-02 DIAGNOSIS — Z7401 Bed confinement status: Secondary | ICD-10-CM | POA: Diagnosis not present

## 2021-11-02 DIAGNOSIS — R262 Difficulty in walking, not elsewhere classified: Secondary | ICD-10-CM | POA: Diagnosis not present

## 2021-11-02 DIAGNOSIS — E119 Type 2 diabetes mellitus without complications: Secondary | ICD-10-CM | POA: Diagnosis not present

## 2021-11-02 DIAGNOSIS — R296 Repeated falls: Secondary | ICD-10-CM | POA: Diagnosis not present

## 2021-11-02 DIAGNOSIS — F17210 Nicotine dependence, cigarettes, uncomplicated: Secondary | ICD-10-CM | POA: Diagnosis not present

## 2021-11-02 DIAGNOSIS — R5381 Other malaise: Secondary | ICD-10-CM | POA: Diagnosis not present

## 2021-11-02 DIAGNOSIS — E785 Hyperlipidemia, unspecified: Secondary | ICD-10-CM | POA: Diagnosis not present

## 2021-11-02 DIAGNOSIS — G629 Polyneuropathy, unspecified: Secondary | ICD-10-CM | POA: Diagnosis not present

## 2021-11-02 DIAGNOSIS — M24542 Contracture, left hand: Secondary | ICD-10-CM | POA: Diagnosis not present

## 2021-11-02 DIAGNOSIS — E1140 Type 2 diabetes mellitus with diabetic neuropathy, unspecified: Secondary | ICD-10-CM | POA: Diagnosis not present

## 2021-11-02 DIAGNOSIS — R41841 Cognitive communication deficit: Secondary | ICD-10-CM | POA: Diagnosis not present

## 2021-11-02 DIAGNOSIS — I4891 Unspecified atrial fibrillation: Secondary | ICD-10-CM | POA: Diagnosis not present

## 2021-11-02 DIAGNOSIS — I6389 Other cerebral infarction: Secondary | ICD-10-CM | POA: Diagnosis not present

## 2021-11-02 DIAGNOSIS — M6281 Muscle weakness (generalized): Secondary | ICD-10-CM | POA: Diagnosis not present

## 2021-11-02 DIAGNOSIS — L02212 Cutaneous abscess of back [any part, except buttock]: Secondary | ICD-10-CM | POA: Diagnosis not present

## 2021-11-02 DIAGNOSIS — R29898 Other symptoms and signs involving the musculoskeletal system: Secondary | ICD-10-CM | POA: Diagnosis not present

## 2021-11-02 DIAGNOSIS — K219 Gastro-esophageal reflux disease without esophagitis: Secondary | ICD-10-CM | POA: Diagnosis not present

## 2021-11-02 DIAGNOSIS — E118 Type 2 diabetes mellitus with unspecified complications: Secondary | ICD-10-CM | POA: Diagnosis not present

## 2021-11-02 DIAGNOSIS — Z20822 Contact with and (suspected) exposure to covid-19: Secondary | ICD-10-CM | POA: Diagnosis not present

## 2021-11-02 DIAGNOSIS — G8194 Hemiplegia, unspecified affecting left nondominant side: Secondary | ICD-10-CM | POA: Diagnosis not present

## 2021-11-02 DIAGNOSIS — L409 Psoriasis, unspecified: Secondary | ICD-10-CM | POA: Diagnosis not present

## 2021-11-02 DIAGNOSIS — G459 Transient cerebral ischemic attack, unspecified: Secondary | ICD-10-CM | POA: Diagnosis not present

## 2021-11-02 DIAGNOSIS — R350 Frequency of micturition: Secondary | ICD-10-CM | POA: Diagnosis not present

## 2021-11-02 DIAGNOSIS — L84 Corns and callosities: Secondary | ICD-10-CM | POA: Diagnosis not present

## 2021-11-02 DIAGNOSIS — Z1159 Encounter for screening for other viral diseases: Secondary | ICD-10-CM | POA: Diagnosis not present

## 2021-11-02 DIAGNOSIS — I6930 Unspecified sequelae of cerebral infarction: Secondary | ICD-10-CM | POA: Diagnosis not present

## 2021-11-02 DIAGNOSIS — Z87820 Personal history of traumatic brain injury: Secondary | ICD-10-CM | POA: Diagnosis not present

## 2021-11-02 DIAGNOSIS — M791 Myalgia, unspecified site: Secondary | ICD-10-CM | POA: Diagnosis not present

## 2021-11-02 DIAGNOSIS — I69354 Hemiplegia and hemiparesis following cerebral infarction affecting left non-dominant side: Secondary | ICD-10-CM | POA: Diagnosis not present

## 2021-11-02 DIAGNOSIS — M48061 Spinal stenosis, lumbar region without neurogenic claudication: Secondary | ICD-10-CM | POA: Diagnosis not present

## 2021-11-02 DIAGNOSIS — M79604 Pain in right leg: Secondary | ICD-10-CM | POA: Diagnosis not present

## 2021-11-02 DIAGNOSIS — Z8619 Personal history of other infectious and parasitic diseases: Secondary | ICD-10-CM | POA: Diagnosis not present

## 2021-11-02 DIAGNOSIS — B20 Human immunodeficiency virus [HIV] disease: Secondary | ICD-10-CM | POA: Diagnosis not present

## 2021-11-02 DIAGNOSIS — Z743 Need for continuous supervision: Secondary | ICD-10-CM | POA: Diagnosis not present

## 2021-11-02 DIAGNOSIS — F17200 Nicotine dependence, unspecified, uncomplicated: Secondary | ICD-10-CM | POA: Diagnosis not present

## 2021-11-02 DIAGNOSIS — E876 Hypokalemia: Secondary | ICD-10-CM | POA: Diagnosis not present

## 2021-11-02 DIAGNOSIS — E46 Unspecified protein-calorie malnutrition: Secondary | ICD-10-CM | POA: Diagnosis not present

## 2021-11-02 DIAGNOSIS — I10 Essential (primary) hypertension: Secondary | ICD-10-CM | POA: Diagnosis not present

## 2021-11-02 DIAGNOSIS — R531 Weakness: Secondary | ICD-10-CM | POA: Diagnosis not present

## 2021-11-02 DIAGNOSIS — I69954 Hemiplegia and hemiparesis following unspecified cerebrovascular disease affecting left non-dominant side: Secondary | ICD-10-CM | POA: Diagnosis not present

## 2021-11-02 NOTE — Progress Notes (Signed)
Patient is going to room 117A at [facility]. Patients niece was notified

## 2021-11-02 NOTE — ED Notes (Signed)
[facility] called to transport patient  

## 2021-11-07 NOTE — Progress Notes (Signed)
Internal Medicine Clinic Attending  Case discussed with Dr. Katsadouros  At the time of the visit.  We reviewed the resident's history and exam and pertinent patient test results.  I agree with the assessment, diagnosis, and plan of care documented in the resident's note.  

## 2021-11-08 ENCOUNTER — Telehealth: Payer: Self-pay | Admitting: Behavioral Health

## 2021-11-08 ENCOUNTER — Telehealth: Payer: Self-pay | Admitting: *Deleted

## 2021-11-08 ENCOUNTER — Institutional Professional Consult (permissible substitution): Payer: Medicare Other | Admitting: Behavioral Health

## 2021-11-08 DIAGNOSIS — E1140 Type 2 diabetes mellitus with diabetic neuropathy, unspecified: Secondary | ICD-10-CM | POA: Diagnosis not present

## 2021-11-08 DIAGNOSIS — L409 Psoriasis, unspecified: Secondary | ICD-10-CM | POA: Diagnosis not present

## 2021-11-08 NOTE — Telephone Encounter (Signed)
° °  Telephone encounter was:  Successful.  11/08/2021 Name: Nicole [name] MRN: [id number] DOB: 10-31-1961  Nicole [name] is a 60 y.o. year old female who is a primary care patient of Katsadouros, Heidi Dach, MD . The community resource team was consulted for assistance with Patient is presently in rehab right now and does not have any community resource needs  Care guide performed the following interventions: Patient provided with information about care guide support team and interviewed to confirm resource needs.  Follow Up Plan:  No further follow up planned at this time. The patient has been provided with needed resources.  Alois Cliche -[facility] Guide , Embedded Care Coordination [facility], Care Management  [phone] [address] , [place] [address] Email : [email or web address]

## 2021-11-08 NOTE — Telephone Encounter (Signed)
Pt sts she is in Rehab presently & has been "admitted" since earlier in the week. Pt has limited privacy & agreed to r/s appt to better time.  Dr. Monna Fam

## 2021-11-15 ENCOUNTER — Other Ambulatory Visit: Payer: Medicare Other

## 2021-11-20 ENCOUNTER — Encounter: Payer: Medicare Other | Admitting: Internal Medicine

## 2021-11-22 DIAGNOSIS — L84 Corns and callosities: Secondary | ICD-10-CM | POA: Diagnosis not present

## 2021-11-22 DIAGNOSIS — E1140 Type 2 diabetes mellitus with diabetic neuropathy, unspecified: Secondary | ICD-10-CM | POA: Diagnosis not present

## 2021-11-22 DIAGNOSIS — L409 Psoriasis, unspecified: Secondary | ICD-10-CM | POA: Diagnosis not present

## 2021-11-24 ENCOUNTER — Other Ambulatory Visit: Payer: Self-pay | Admitting: Student

## 2021-11-24 DIAGNOSIS — E119 Type 2 diabetes mellitus without complications: Secondary | ICD-10-CM

## 2021-11-29 ENCOUNTER — Encounter: Payer: Medicare Other | Admitting: Internal Medicine

## 2021-12-07 DIAGNOSIS — M6281 Muscle weakness (generalized): Secondary | ICD-10-CM | POA: Diagnosis not present

## 2021-12-07 DIAGNOSIS — I69354 Hemiplegia and hemiparesis following cerebral infarction affecting left non-dominant side: Secondary | ICD-10-CM | POA: Diagnosis not present

## 2021-12-07 DIAGNOSIS — R5381 Other malaise: Secondary | ICD-10-CM | POA: Diagnosis not present

## 2021-12-14 DIAGNOSIS — Z20822 Contact with and (suspected) exposure to covid-19: Secondary | ICD-10-CM | POA: Diagnosis not present

## 2021-12-18 DIAGNOSIS — L02212 Cutaneous abscess of back [any part, except buttock]: Secondary | ICD-10-CM | POA: Diagnosis not present

## 2021-12-28 ENCOUNTER — Ambulatory Visit (HOSPITAL_COMMUNITY)
Admission: RE | Admit: 2021-12-28 | Discharge: 2021-12-28 | Disposition: A | Discharge status: Home / Self Care | Payer: No Typology Code available for payment source | Source: Ambulatory Visit | Attending: Internal Medicine | Admitting: Internal Medicine

## 2021-12-28 ENCOUNTER — Other Ambulatory Visit: Payer: Self-pay

## 2021-12-28 DIAGNOSIS — I6389 Other cerebral infarction: Secondary | ICD-10-CM | POA: Diagnosis not present

## 2021-12-28 DIAGNOSIS — F17210 Nicotine dependence, cigarettes, uncomplicated: Secondary | ICD-10-CM | POA: Insufficient documentation

## 2021-12-28 DIAGNOSIS — I6930 Unspecified sequelae of cerebral infarction: Secondary | ICD-10-CM | POA: Diagnosis not present

## 2021-12-28 DIAGNOSIS — I10 Essential (primary) hypertension: Secondary | ICD-10-CM | POA: Insufficient documentation

## 2021-12-28 DIAGNOSIS — E119 Type 2 diabetes mellitus without complications: Secondary | ICD-10-CM | POA: Diagnosis not present

## 2021-12-28 LAB — ECHOCARDIOGRAM COMPLETE
AR max vel: 3.14 cm2
AV Peak grad: 4.5 mmHg
Ao pk vel: 1.06 m/s
Area-P 1/2: 3.87 cm2
Calc EF: 57.5 %
S' Lateral: 2.5 cm
Single Plane A2C EF: 56.6 %
Single Plane A4C EF: 56.9 %

## 2022-01-16 DIAGNOSIS — B20 Human immunodeficiency virus [HIV] disease: Secondary | ICD-10-CM | POA: Diagnosis not present

## 2022-01-16 DIAGNOSIS — E876 Hypokalemia: Secondary | ICD-10-CM | POA: Diagnosis not present

## 2022-01-16 DIAGNOSIS — K219 Gastro-esophageal reflux disease without esophagitis: Secondary | ICD-10-CM | POA: Diagnosis not present

## 2022-01-16 DIAGNOSIS — E785 Hyperlipidemia, unspecified: Secondary | ICD-10-CM | POA: Diagnosis not present

## 2022-01-16 DIAGNOSIS — E118 Type 2 diabetes mellitus with unspecified complications: Secondary | ICD-10-CM | POA: Diagnosis not present

## 2022-01-16 DIAGNOSIS — I10 Essential (primary) hypertension: Secondary | ICD-10-CM | POA: Diagnosis not present

## 2022-01-16 DIAGNOSIS — I6930 Unspecified sequelae of cerebral infarction: Secondary | ICD-10-CM | POA: Diagnosis not present

## 2022-01-16 DIAGNOSIS — F17200 Nicotine dependence, unspecified, uncomplicated: Secondary | ICD-10-CM | POA: Diagnosis not present

## 2022-01-16 DIAGNOSIS — G629 Polyneuropathy, unspecified: Secondary | ICD-10-CM | POA: Diagnosis not present

## 2022-01-20 DIAGNOSIS — Z20822 Contact with and (suspected) exposure to covid-19: Secondary | ICD-10-CM | POA: Diagnosis not present

## 2022-02-02 DIAGNOSIS — Z20822 Contact with and (suspected) exposure to covid-19: Secondary | ICD-10-CM | POA: Diagnosis not present

## 2022-02-08 DIAGNOSIS — E118 Type 2 diabetes mellitus with unspecified complications: Secondary | ICD-10-CM | POA: Diagnosis not present

## 2022-02-08 DIAGNOSIS — G629 Polyneuropathy, unspecified: Secondary | ICD-10-CM | POA: Diagnosis not present

## 2022-02-08 DIAGNOSIS — M1611 Unilateral primary osteoarthritis, right hip: Secondary | ICD-10-CM | POA: Diagnosis not present

## 2022-02-08 DIAGNOSIS — M48061 Spinal stenosis, lumbar region without neurogenic claudication: Secondary | ICD-10-CM | POA: Diagnosis not present

## 2022-02-08 DIAGNOSIS — F17200 Nicotine dependence, unspecified, uncomplicated: Secondary | ICD-10-CM | POA: Diagnosis not present

## 2022-02-08 DIAGNOSIS — I10 Essential (primary) hypertension: Secondary | ICD-10-CM | POA: Diagnosis not present

## 2022-02-08 DIAGNOSIS — E876 Hypokalemia: Secondary | ICD-10-CM | POA: Diagnosis not present

## 2022-02-08 DIAGNOSIS — K219 Gastro-esophageal reflux disease without esophagitis: Secondary | ICD-10-CM | POA: Diagnosis not present

## 2022-02-08 DIAGNOSIS — M24542 Contracture, left hand: Secondary | ICD-10-CM | POA: Diagnosis not present

## 2022-02-08 DIAGNOSIS — B20 Human immunodeficiency virus [HIV] disease: Secondary | ICD-10-CM | POA: Diagnosis not present

## 2022-02-08 DIAGNOSIS — I6930 Unspecified sequelae of cerebral infarction: Secondary | ICD-10-CM | POA: Diagnosis not present

## 2022-02-08 DIAGNOSIS — E785 Hyperlipidemia, unspecified: Secondary | ICD-10-CM | POA: Diagnosis not present

## 2022-02-11 DIAGNOSIS — M24542 Contracture, left hand: Secondary | ICD-10-CM | POA: Diagnosis not present

## 2022-02-11 DIAGNOSIS — Z7982 Long term (current) use of aspirin: Secondary | ICD-10-CM | POA: Diagnosis not present

## 2022-02-11 DIAGNOSIS — J309 Allergic rhinitis, unspecified: Secondary | ICD-10-CM | POA: Diagnosis not present

## 2022-02-11 DIAGNOSIS — F17210 Nicotine dependence, cigarettes, uncomplicated: Secondary | ICD-10-CM | POA: Diagnosis not present

## 2022-02-11 DIAGNOSIS — R296 Repeated falls: Secondary | ICD-10-CM | POA: Diagnosis not present

## 2022-02-11 DIAGNOSIS — I69354 Hemiplegia and hemiparesis following cerebral infarction affecting left non-dominant side: Secondary | ICD-10-CM | POA: Diagnosis not present

## 2022-02-11 DIAGNOSIS — I4891 Unspecified atrial fibrillation: Secondary | ICD-10-CM | POA: Diagnosis not present

## 2022-02-11 DIAGNOSIS — E46 Unspecified protein-calorie malnutrition: Secondary | ICD-10-CM | POA: Diagnosis not present

## 2022-02-11 DIAGNOSIS — K219 Gastro-esophageal reflux disease without esophagitis: Secondary | ICD-10-CM | POA: Diagnosis not present

## 2022-02-11 DIAGNOSIS — F32A Depression, unspecified: Secondary | ICD-10-CM | POA: Diagnosis not present

## 2022-02-11 DIAGNOSIS — M48061 Spinal stenosis, lumbar region without neurogenic claudication: Secondary | ICD-10-CM | POA: Diagnosis not present

## 2022-02-11 DIAGNOSIS — I10 Essential (primary) hypertension: Secondary | ICD-10-CM | POA: Diagnosis not present

## 2022-02-11 DIAGNOSIS — E1142 Type 2 diabetes mellitus with diabetic polyneuropathy: Secondary | ICD-10-CM | POA: Diagnosis not present

## 2022-02-11 DIAGNOSIS — B182 Chronic viral hepatitis C: Secondary | ICD-10-CM | POA: Diagnosis not present

## 2022-02-11 DIAGNOSIS — M1611 Unilateral primary osteoarthritis, right hip: Secondary | ICD-10-CM | POA: Diagnosis not present

## 2022-02-11 DIAGNOSIS — L409 Psoriasis, unspecified: Secondary | ICD-10-CM | POA: Diagnosis not present

## 2022-02-11 DIAGNOSIS — Z87820 Personal history of traumatic brain injury: Secondary | ICD-10-CM | POA: Diagnosis not present

## 2022-02-11 DIAGNOSIS — E785 Hyperlipidemia, unspecified: Secondary | ICD-10-CM | POA: Diagnosis not present

## 2022-02-11 DIAGNOSIS — Z21 Asymptomatic human immunodeficiency virus [HIV] infection status: Secondary | ICD-10-CM | POA: Diagnosis not present

## 2022-02-11 DIAGNOSIS — Z9181 History of falling: Secondary | ICD-10-CM | POA: Diagnosis not present

## 2022-02-11 DIAGNOSIS — E876 Hypokalemia: Secondary | ICD-10-CM | POA: Diagnosis not present

## 2022-02-11 DIAGNOSIS — Z6831 Body mass index (BMI) 31.0-31.9, adult: Secondary | ICD-10-CM | POA: Diagnosis not present

## 2022-02-11 DIAGNOSIS — Z7985 Long-term (current) use of injectable non-insulin antidiabetic drugs: Secondary | ICD-10-CM | POA: Diagnosis not present

## 2022-02-12 DIAGNOSIS — M48061 Spinal stenosis, lumbar region without neurogenic claudication: Secondary | ICD-10-CM | POA: Diagnosis not present

## 2022-02-12 DIAGNOSIS — M24542 Contracture, left hand: Secondary | ICD-10-CM | POA: Diagnosis not present

## 2022-02-12 DIAGNOSIS — I69354 Hemiplegia and hemiparesis following cerebral infarction affecting left non-dominant side: Secondary | ICD-10-CM | POA: Diagnosis not present

## 2022-02-12 DIAGNOSIS — M1611 Unilateral primary osteoarthritis, right hip: Secondary | ICD-10-CM | POA: Diagnosis not present

## 2022-02-12 DIAGNOSIS — E1142 Type 2 diabetes mellitus with diabetic polyneuropathy: Secondary | ICD-10-CM | POA: Diagnosis not present

## 2022-02-12 DIAGNOSIS — E46 Unspecified protein-calorie malnutrition: Secondary | ICD-10-CM | POA: Diagnosis not present

## 2022-02-13 ENCOUNTER — Telehealth: Payer: Self-pay | Admitting: *Deleted

## 2022-02-13 DIAGNOSIS — I6930 Unspecified sequelae of cerebral infarction: Secondary | ICD-10-CM

## 2022-02-13 DIAGNOSIS — I69398 Other sequelae of cerebral infarction: Secondary | ICD-10-CM

## 2022-02-13 NOTE — Telephone Encounter (Signed)
Order placed for home health PT. Can we please schedule the patient for a follow up appointment in the clinic since being discharged? ? ?Thank you! ?

## 2022-02-13 NOTE — Telephone Encounter (Signed)
Call from Parrott from [facility]. Needs orders for PT for patient who was recently discharged from a skilled Nursing facility after 3 weeks    PT  2 times per week for 6 weeks and then PT 1 time per week for 3 weeks.  Reason lower extremity weakness and left sided weakness. ?

## 2022-02-14 ENCOUNTER — Telehealth: Payer: Self-pay | Admitting: Student

## 2022-02-14 NOTE — Telephone Encounter (Signed)
Jerrilyn fom [facility] was called and informed hat PT has been approved. ?

## 2022-02-14 NOTE — Telephone Encounter (Signed)
TOC HFU APPT ? ?Name: Nicole [name], Nicole [name] MRN: [id number]  ?Date: 02/22/2022 Status: Sch  ?Time: 10:45 AM Length: 30  ?Visit Type: OPEN ESTABLISHED [726] Copay: $0.00  ?Provider: Doran Stabler, DO    ? ?

## 2022-02-17 DIAGNOSIS — M24542 Contracture, left hand: Secondary | ICD-10-CM | POA: Diagnosis not present

## 2022-02-17 DIAGNOSIS — E46 Unspecified protein-calorie malnutrition: Secondary | ICD-10-CM | POA: Diagnosis not present

## 2022-02-17 DIAGNOSIS — E1142 Type 2 diabetes mellitus with diabetic polyneuropathy: Secondary | ICD-10-CM | POA: Diagnosis not present

## 2022-02-17 DIAGNOSIS — M48061 Spinal stenosis, lumbar region without neurogenic claudication: Secondary | ICD-10-CM | POA: Diagnosis not present

## 2022-02-17 DIAGNOSIS — M1611 Unilateral primary osteoarthritis, right hip: Secondary | ICD-10-CM | POA: Diagnosis not present

## 2022-02-17 DIAGNOSIS — I69354 Hemiplegia and hemiparesis following cerebral infarction affecting left non-dominant side: Secondary | ICD-10-CM | POA: Diagnosis not present

## 2022-02-19 DIAGNOSIS — M48061 Spinal stenosis, lumbar region without neurogenic claudication: Secondary | ICD-10-CM | POA: Diagnosis not present

## 2022-02-19 DIAGNOSIS — M1611 Unilateral primary osteoarthritis, right hip: Secondary | ICD-10-CM | POA: Diagnosis not present

## 2022-02-19 DIAGNOSIS — E1142 Type 2 diabetes mellitus with diabetic polyneuropathy: Secondary | ICD-10-CM | POA: Diagnosis not present

## 2022-02-19 DIAGNOSIS — M24542 Contracture, left hand: Secondary | ICD-10-CM | POA: Diagnosis not present

## 2022-02-19 DIAGNOSIS — I69354 Hemiplegia and hemiparesis following cerebral infarction affecting left non-dominant side: Secondary | ICD-10-CM | POA: Diagnosis not present

## 2022-02-19 DIAGNOSIS — E46 Unspecified protein-calorie malnutrition: Secondary | ICD-10-CM | POA: Diagnosis not present

## 2022-02-20 ENCOUNTER — Telehealth: Payer: Self-pay | Admitting: *Deleted

## 2022-02-20 ENCOUNTER — Other Ambulatory Visit: Payer: Self-pay | Admitting: Student

## 2022-02-20 DIAGNOSIS — E46 Unspecified protein-calorie malnutrition: Secondary | ICD-10-CM | POA: Diagnosis not present

## 2022-02-20 DIAGNOSIS — E1142 Type 2 diabetes mellitus with diabetic polyneuropathy: Secondary | ICD-10-CM | POA: Diagnosis not present

## 2022-02-20 DIAGNOSIS — M48061 Spinal stenosis, lumbar region without neurogenic claudication: Secondary | ICD-10-CM | POA: Diagnosis not present

## 2022-02-20 DIAGNOSIS — M24542 Contracture, left hand: Secondary | ICD-10-CM | POA: Diagnosis not present

## 2022-02-20 DIAGNOSIS — M1611 Unilateral primary osteoarthritis, right hip: Secondary | ICD-10-CM | POA: Diagnosis not present

## 2022-02-20 DIAGNOSIS — I69354 Hemiplegia and hemiparesis following cerebral infarction affecting left non-dominant side: Secondary | ICD-10-CM | POA: Diagnosis not present

## 2022-02-20 MED ORDER — ACCU-CHEK GUIDE ME W/DEVICE KIT: 1.0000 | PACK | Freq: Every morning | 0 refills | Status: DC

## 2022-02-20 NOTE — Telephone Encounter (Signed)
Call fro Nicole [name] nurse with [facility] - stated pt was recently discharged from the nursing home and pt's glucometer was misplaced - requesting rx [facility] Accu-chek Guide device meter. ?Thanks ?

## 2022-02-20 NOTE — Telephone Encounter (Signed)
Meter ordered. Thank you! ?

## 2022-02-20 NOTE — Telephone Encounter (Signed)
During our last appointment we had discussed stopping insulin checks because she is not on insulin therapy and I didn't see the benefit in sticking herself multiple times per day.  ? ?If she would still like to check her sugars I'm happy to send another script. Thanks ?

## 2022-02-20 NOTE — Telephone Encounter (Signed)
Pt called / informed of Dr Johnney Ou' response. Pt stated the [facility] wants her to check her BS's every morning. Pt would like her doctor to send in a rx for a meter. ?Thanks ?

## 2022-02-22 ENCOUNTER — Ambulatory Visit (INDEPENDENT_AMBULATORY_CARE_PROVIDER_SITE_OTHER): Payer: Medicare Other | Admitting: Student

## 2022-02-22 ENCOUNTER — Encounter: Payer: Self-pay | Admitting: Student

## 2022-02-22 VITALS — BP 113/76 | HR 73

## 2022-02-22 DIAGNOSIS — E876 Hypokalemia: Secondary | ICD-10-CM | POA: Diagnosis not present

## 2022-02-22 DIAGNOSIS — M1611 Unilateral primary osteoarthritis, right hip: Secondary | ICD-10-CM | POA: Diagnosis not present

## 2022-02-22 DIAGNOSIS — M48061 Spinal stenosis, lumbar region without neurogenic claudication: Secondary | ICD-10-CM | POA: Diagnosis not present

## 2022-02-22 DIAGNOSIS — M24542 Contracture, left hand: Secondary | ICD-10-CM | POA: Diagnosis not present

## 2022-02-22 DIAGNOSIS — K219 Gastro-esophageal reflux disease without esophagitis: Secondary | ICD-10-CM | POA: Diagnosis not present

## 2022-02-22 DIAGNOSIS — F17200 Nicotine dependence, unspecified, uncomplicated: Secondary | ICD-10-CM | POA: Diagnosis not present

## 2022-02-22 DIAGNOSIS — I6930 Unspecified sequelae of cerebral infarction: Secondary | ICD-10-CM | POA: Diagnosis not present

## 2022-02-22 DIAGNOSIS — E46 Unspecified protein-calorie malnutrition: Secondary | ICD-10-CM | POA: Diagnosis not present

## 2022-02-22 DIAGNOSIS — Z1211 Encounter for screening for malignant neoplasm of colon: Secondary | ICD-10-CM

## 2022-02-22 DIAGNOSIS — I69354 Hemiplegia and hemiparesis following cerebral infarction affecting left non-dominant side: Secondary | ICD-10-CM | POA: Diagnosis not present

## 2022-02-22 DIAGNOSIS — I10 Essential (primary) hypertension: Secondary | ICD-10-CM

## 2022-02-22 DIAGNOSIS — E119 Type 2 diabetes mellitus without complications: Secondary | ICD-10-CM

## 2022-02-22 DIAGNOSIS — Z0000 Encounter for general adult medical examination without abnormal findings: Secondary | ICD-10-CM

## 2022-02-22 DIAGNOSIS — E1142 Type 2 diabetes mellitus with diabetic polyneuropathy: Secondary | ICD-10-CM | POA: Diagnosis not present

## 2022-02-22 LAB — GLUCOSE, CAPILLARY: Glucose-Capillary: 144 mg/dL — ABNORMAL HIGH (ref 70–99)

## 2022-02-22 LAB — POCT GLYCOSYLATED HEMOGLOBIN (HGB A1C): Hemoglobin A1C: 6.1 % — AB (ref 4.0–5.6)

## 2022-02-22 MED ORDER — ADULT BLOOD PRESSURE CUFF LG KIT: 1.0000 | PACK | 0 refills | Status: DC

## 2022-02-22 MED ORDER — DAPAGLIFLOZIN PROPANEDIOL 10 MG PO TABS: 10.0000 mg | ORAL_TABLET | Freq: Every day | ORAL | 3 refills | Status: DC

## 2022-02-22 MED ORDER — POTASSIUM CHLORIDE CRYS ER 20 MEQ PO TBCR: 20.0000 meq | EXTENDED_RELEASE_TABLET | Freq: Every day | ORAL | 0 refills | Status: DC

## 2022-02-22 MED ORDER — OZEMPIC (0.25 OR 0.5 MG/DOSE) 2 MG/1.5ML ~~LOC~~ SOPN: 0.5000 mg | PEN_INJECTOR | SUBCUTANEOUS | Status: DC

## 2022-02-22 MED ORDER — OMEPRAZOLE MAGNESIUM 20 MG PO TBEC: 40.0000 mg | DELAYED_RELEASE_TABLET | Freq: Every day | ORAL | 1 refills | Status: DC

## 2022-02-22 NOTE — Assessment & Plan Note (Signed)
-   Patient will call to reschedule her mammogram.  She missed the appointment while she was in rehab. ?- Referral to GI for colonoscopy ?- Up to date on COVID-vaccine ?
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
## 2022-02-22 NOTE — Patient Instructions (Addendum)
Nicole [name], ? ?It was a pleasure seeing you in the clinic today.  Here is a summary what we talked about ? ?1.  I am glad you are doing better after rehab.  Please continue aspirin and Crestor.  I will check your cholesterol panel today.  I placed an order for a rollator walker. ? ?2.  Your blood pressure looks great today.  Please continue losartan-HCTZ.  I placed an order for a blood pressure kit.  Please check your blood pressure 2 or 3 times a week and bring it with you to the next visit. ? ?3.  Your A1c is 6.1.  Please continue Farxiga and Ozempic. ? ?Please return in 6 months ? ?Take care, ? ?Dr. Alfonse Spruce ?

## 2022-02-22 NOTE — Progress Notes (Signed)
Internal Medicine Clinic Attending  Case discussed with Dr. Nguyen  At the time of the visit.  We reviewed the resident's history and exam and pertinent patient test results.  I agree with the assessment, diagnosis, and plan of care documented in the resident's note. 
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
## 2022-02-22 NOTE — Assessment & Plan Note (Signed)
Diabetes well controlled.  A1c 6.1.  Patient report adherence to medication. ? ?-Continue Farxiga 5 mg and Ozempic 0.5 mg weekly ?

## 2022-02-22 NOTE — Assessment & Plan Note (Signed)
Blood pressures well controlled. ? ?-Continue losartan-HCTZ 50-12.5 mg daily ?-Last BMP was unremarkable. ?-Blood pressure cuff kit ordered ?

## 2022-02-23 LAB — LIPID PANEL
Chol/HDL Ratio: 2 ratio (ref 0.0–4.4)
Cholesterol, Total: 143 mg/dL (ref 100–199)
HDL: 72 mg/dL (ref >39)
LDL Chol Calc (NIH): 56 mg/dL (ref 0–99)
Triglycerides: 81 mg/dL (ref 0–149)
VLDL Cholesterol Cal: 15 mg/dL (ref 5–40)

## 2022-02-26 DIAGNOSIS — M48061 Spinal stenosis, lumbar region without neurogenic claudication: Secondary | ICD-10-CM | POA: Diagnosis not present

## 2022-02-26 DIAGNOSIS — M1611 Unilateral primary osteoarthritis, right hip: Secondary | ICD-10-CM | POA: Diagnosis not present

## 2022-02-26 DIAGNOSIS — I69354 Hemiplegia and hemiparesis following cerebral infarction affecting left non-dominant side: Secondary | ICD-10-CM | POA: Diagnosis not present

## 2022-02-26 DIAGNOSIS — M24542 Contracture, left hand: Secondary | ICD-10-CM | POA: Diagnosis not present

## 2022-02-26 DIAGNOSIS — E46 Unspecified protein-calorie malnutrition: Secondary | ICD-10-CM | POA: Diagnosis not present

## 2022-02-26 DIAGNOSIS — Z20822 Contact with and (suspected) exposure to covid-19: Secondary | ICD-10-CM | POA: Diagnosis not present

## 2022-02-26 DIAGNOSIS — E1142 Type 2 diabetes mellitus with diabetic polyneuropathy: Secondary | ICD-10-CM | POA: Diagnosis not present

## 2022-03-01 DIAGNOSIS — M24542 Contracture, left hand: Secondary | ICD-10-CM | POA: Diagnosis not present

## 2022-03-01 DIAGNOSIS — M48061 Spinal stenosis, lumbar region without neurogenic claudication: Secondary | ICD-10-CM | POA: Diagnosis not present

## 2022-03-01 DIAGNOSIS — E46 Unspecified protein-calorie malnutrition: Secondary | ICD-10-CM | POA: Diagnosis not present

## 2022-03-01 DIAGNOSIS — M1611 Unilateral primary osteoarthritis, right hip: Secondary | ICD-10-CM | POA: Diagnosis not present

## 2022-03-01 DIAGNOSIS — E1142 Type 2 diabetes mellitus with diabetic polyneuropathy: Secondary | ICD-10-CM | POA: Diagnosis not present

## 2022-03-01 DIAGNOSIS — I69354 Hemiplegia and hemiparesis following cerebral infarction affecting left non-dominant side: Secondary | ICD-10-CM | POA: Diagnosis not present

## 2022-03-02 ENCOUNTER — Telehealth: Payer: Self-pay

## 2022-03-02 NOTE — Telephone Encounter (Signed)
Requesting an order for wheelchair, please call pt back.  ?

## 2022-03-05 ENCOUNTER — Other Ambulatory Visit: Payer: Self-pay

## 2022-03-05 DIAGNOSIS — M24542 Contracture, left hand: Secondary | ICD-10-CM | POA: Diagnosis not present

## 2022-03-05 DIAGNOSIS — E1142 Type 2 diabetes mellitus with diabetic polyneuropathy: Secondary | ICD-10-CM | POA: Diagnosis not present

## 2022-03-05 DIAGNOSIS — I69354 Hemiplegia and hemiparesis following cerebral infarction affecting left non-dominant side: Secondary | ICD-10-CM | POA: Diagnosis not present

## 2022-03-05 DIAGNOSIS — M1611 Unilateral primary osteoarthritis, right hip: Secondary | ICD-10-CM | POA: Diagnosis not present

## 2022-03-05 DIAGNOSIS — M48061 Spinal stenosis, lumbar region without neurogenic claudication: Secondary | ICD-10-CM | POA: Diagnosis not present

## 2022-03-05 DIAGNOSIS — E46 Unspecified protein-calorie malnutrition: Secondary | ICD-10-CM | POA: Diagnosis not present

## 2022-03-05 MED ORDER — ACCU-CHEK GUIDE ME W/DEVICE KIT: 1.0000 | PACK | Freq: Every morning | 0 refills | Status: AC

## 2022-03-06 DIAGNOSIS — M24542 Contracture, left hand: Secondary | ICD-10-CM | POA: Diagnosis not present

## 2022-03-06 DIAGNOSIS — E46 Unspecified protein-calorie malnutrition: Secondary | ICD-10-CM | POA: Diagnosis not present

## 2022-03-06 DIAGNOSIS — I69354 Hemiplegia and hemiparesis following cerebral infarction affecting left non-dominant side: Secondary | ICD-10-CM | POA: Diagnosis not present

## 2022-03-06 DIAGNOSIS — M1611 Unilateral primary osteoarthritis, right hip: Secondary | ICD-10-CM | POA: Diagnosis not present

## 2022-03-06 DIAGNOSIS — M48061 Spinal stenosis, lumbar region without neurogenic claudication: Secondary | ICD-10-CM | POA: Diagnosis not present

## 2022-03-06 DIAGNOSIS — E1142 Type 2 diabetes mellitus with diabetic polyneuropathy: Secondary | ICD-10-CM | POA: Diagnosis not present

## 2022-03-06 NOTE — Telephone Encounter (Signed)
I returned patient phone call about Rolator walker walker walker and the order is in and I sent a community Message to [facility] [place], [place] C4/25/202311:24 AM ? ? ?

## 2022-03-08 DIAGNOSIS — M24542 Contracture, left hand: Secondary | ICD-10-CM | POA: Diagnosis not present

## 2022-03-08 DIAGNOSIS — I69354 Hemiplegia and hemiparesis following cerebral infarction affecting left non-dominant side: Secondary | ICD-10-CM | POA: Diagnosis not present

## 2022-03-08 DIAGNOSIS — E1142 Type 2 diabetes mellitus with diabetic polyneuropathy: Secondary | ICD-10-CM | POA: Diagnosis not present

## 2022-03-08 DIAGNOSIS — M48061 Spinal stenosis, lumbar region without neurogenic claudication: Secondary | ICD-10-CM | POA: Diagnosis not present

## 2022-03-08 DIAGNOSIS — E46 Unspecified protein-calorie malnutrition: Secondary | ICD-10-CM | POA: Diagnosis not present

## 2022-03-08 DIAGNOSIS — M1611 Unilateral primary osteoarthritis, right hip: Secondary | ICD-10-CM | POA: Diagnosis not present

## 2022-03-12 DIAGNOSIS — M24542 Contracture, left hand: Secondary | ICD-10-CM | POA: Diagnosis not present

## 2022-03-12 DIAGNOSIS — E1142 Type 2 diabetes mellitus with diabetic polyneuropathy: Secondary | ICD-10-CM | POA: Diagnosis not present

## 2022-03-12 DIAGNOSIS — I69354 Hemiplegia and hemiparesis following cerebral infarction affecting left non-dominant side: Secondary | ICD-10-CM | POA: Diagnosis not present

## 2022-03-12 DIAGNOSIS — E46 Unspecified protein-calorie malnutrition: Secondary | ICD-10-CM | POA: Diagnosis not present

## 2022-03-12 DIAGNOSIS — M1611 Unilateral primary osteoarthritis, right hip: Secondary | ICD-10-CM | POA: Diagnosis not present

## 2022-03-12 DIAGNOSIS — M48061 Spinal stenosis, lumbar region without neurogenic claudication: Secondary | ICD-10-CM | POA: Diagnosis not present

## 2022-03-13 DIAGNOSIS — M24542 Contracture, left hand: Secondary | ICD-10-CM | POA: Diagnosis not present

## 2022-03-13 DIAGNOSIS — F32A Depression, unspecified: Secondary | ICD-10-CM | POA: Diagnosis not present

## 2022-03-13 DIAGNOSIS — M1611 Unilateral primary osteoarthritis, right hip: Secondary | ICD-10-CM | POA: Diagnosis not present

## 2022-03-13 DIAGNOSIS — Z7985 Long-term (current) use of injectable non-insulin antidiabetic drugs: Secondary | ICD-10-CM | POA: Diagnosis not present

## 2022-03-13 DIAGNOSIS — M48061 Spinal stenosis, lumbar region without neurogenic claudication: Secondary | ICD-10-CM | POA: Diagnosis not present

## 2022-03-13 DIAGNOSIS — B182 Chronic viral hepatitis C: Secondary | ICD-10-CM | POA: Diagnosis not present

## 2022-03-13 DIAGNOSIS — E785 Hyperlipidemia, unspecified: Secondary | ICD-10-CM | POA: Diagnosis not present

## 2022-03-13 DIAGNOSIS — L409 Psoriasis, unspecified: Secondary | ICD-10-CM | POA: Diagnosis not present

## 2022-03-13 DIAGNOSIS — Z6831 Body mass index (BMI) 31.0-31.9, adult: Secondary | ICD-10-CM | POA: Diagnosis not present

## 2022-03-13 DIAGNOSIS — Z9181 History of falling: Secondary | ICD-10-CM | POA: Diagnosis not present

## 2022-03-13 DIAGNOSIS — I10 Essential (primary) hypertension: Secondary | ICD-10-CM | POA: Diagnosis not present

## 2022-03-13 DIAGNOSIS — J309 Allergic rhinitis, unspecified: Secondary | ICD-10-CM | POA: Diagnosis not present

## 2022-03-13 DIAGNOSIS — Z21 Asymptomatic human immunodeficiency virus [HIV] infection status: Secondary | ICD-10-CM | POA: Diagnosis not present

## 2022-03-13 DIAGNOSIS — Z87820 Personal history of traumatic brain injury: Secondary | ICD-10-CM | POA: Diagnosis not present

## 2022-03-13 DIAGNOSIS — Z7982 Long term (current) use of aspirin: Secondary | ICD-10-CM | POA: Diagnosis not present

## 2022-03-13 DIAGNOSIS — I69354 Hemiplegia and hemiparesis following cerebral infarction affecting left non-dominant side: Secondary | ICD-10-CM | POA: Diagnosis not present

## 2022-03-13 DIAGNOSIS — K219 Gastro-esophageal reflux disease without esophagitis: Secondary | ICD-10-CM | POA: Diagnosis not present

## 2022-03-13 DIAGNOSIS — E46 Unspecified protein-calorie malnutrition: Secondary | ICD-10-CM | POA: Diagnosis not present

## 2022-03-13 DIAGNOSIS — I4891 Unspecified atrial fibrillation: Secondary | ICD-10-CM | POA: Diagnosis not present

## 2022-03-13 DIAGNOSIS — R296 Repeated falls: Secondary | ICD-10-CM | POA: Diagnosis not present

## 2022-03-13 DIAGNOSIS — E1142 Type 2 diabetes mellitus with diabetic polyneuropathy: Secondary | ICD-10-CM | POA: Diagnosis not present

## 2022-03-13 DIAGNOSIS — F17210 Nicotine dependence, cigarettes, uncomplicated: Secondary | ICD-10-CM | POA: Diagnosis not present

## 2022-03-13 DIAGNOSIS — E876 Hypokalemia: Secondary | ICD-10-CM | POA: Diagnosis not present

## 2022-03-13 DIAGNOSIS — Z20822 Contact with and (suspected) exposure to covid-19: Secondary | ICD-10-CM | POA: Diagnosis not present

## 2022-03-15 DIAGNOSIS — M24542 Contracture, left hand: Secondary | ICD-10-CM | POA: Diagnosis not present

## 2022-03-15 DIAGNOSIS — E46 Unspecified protein-calorie malnutrition: Secondary | ICD-10-CM | POA: Diagnosis not present

## 2022-03-15 DIAGNOSIS — E1142 Type 2 diabetes mellitus with diabetic polyneuropathy: Secondary | ICD-10-CM | POA: Diagnosis not present

## 2022-03-15 DIAGNOSIS — M1611 Unilateral primary osteoarthritis, right hip: Secondary | ICD-10-CM | POA: Diagnosis not present

## 2022-03-15 DIAGNOSIS — I69354 Hemiplegia and hemiparesis following cerebral infarction affecting left non-dominant side: Secondary | ICD-10-CM | POA: Diagnosis not present

## 2022-03-15 DIAGNOSIS — M48061 Spinal stenosis, lumbar region without neurogenic claudication: Secondary | ICD-10-CM | POA: Diagnosis not present

## 2022-03-15 DIAGNOSIS — Z20822 Contact with and (suspected) exposure to covid-19: Secondary | ICD-10-CM | POA: Diagnosis not present

## 2022-03-19 DIAGNOSIS — E1142 Type 2 diabetes mellitus with diabetic polyneuropathy: Secondary | ICD-10-CM | POA: Diagnosis not present

## 2022-03-19 DIAGNOSIS — E46 Unspecified protein-calorie malnutrition: Secondary | ICD-10-CM | POA: Diagnosis not present

## 2022-03-19 DIAGNOSIS — I69354 Hemiplegia and hemiparesis following cerebral infarction affecting left non-dominant side: Secondary | ICD-10-CM | POA: Diagnosis not present

## 2022-03-19 DIAGNOSIS — M1611 Unilateral primary osteoarthritis, right hip: Secondary | ICD-10-CM | POA: Diagnosis not present

## 2022-03-19 DIAGNOSIS — M24542 Contracture, left hand: Secondary | ICD-10-CM | POA: Diagnosis not present

## 2022-03-19 DIAGNOSIS — M48061 Spinal stenosis, lumbar region without neurogenic claudication: Secondary | ICD-10-CM | POA: Diagnosis not present

## 2022-03-20 DIAGNOSIS — M1611 Unilateral primary osteoarthritis, right hip: Secondary | ICD-10-CM | POA: Diagnosis not present

## 2022-03-20 DIAGNOSIS — E1142 Type 2 diabetes mellitus with diabetic polyneuropathy: Secondary | ICD-10-CM | POA: Diagnosis not present

## 2022-03-20 DIAGNOSIS — E46 Unspecified protein-calorie malnutrition: Secondary | ICD-10-CM | POA: Diagnosis not present

## 2022-03-20 DIAGNOSIS — M48061 Spinal stenosis, lumbar region without neurogenic claudication: Secondary | ICD-10-CM | POA: Diagnosis not present

## 2022-03-20 DIAGNOSIS — I69354 Hemiplegia and hemiparesis following cerebral infarction affecting left non-dominant side: Secondary | ICD-10-CM | POA: Diagnosis not present

## 2022-03-20 DIAGNOSIS — M24542 Contracture, left hand: Secondary | ICD-10-CM | POA: Diagnosis not present

## 2022-03-22 DIAGNOSIS — I69354 Hemiplegia and hemiparesis following cerebral infarction affecting left non-dominant side: Secondary | ICD-10-CM | POA: Diagnosis not present

## 2022-03-22 DIAGNOSIS — E1142 Type 2 diabetes mellitus with diabetic polyneuropathy: Secondary | ICD-10-CM | POA: Diagnosis not present

## 2022-03-22 DIAGNOSIS — M24542 Contracture, left hand: Secondary | ICD-10-CM | POA: Diagnosis not present

## 2022-03-22 DIAGNOSIS — M1611 Unilateral primary osteoarthritis, right hip: Secondary | ICD-10-CM | POA: Diagnosis not present

## 2022-03-22 DIAGNOSIS — M48061 Spinal stenosis, lumbar region without neurogenic claudication: Secondary | ICD-10-CM | POA: Diagnosis not present

## 2022-03-22 DIAGNOSIS — E46 Unspecified protein-calorie malnutrition: Secondary | ICD-10-CM | POA: Diagnosis not present

## 2022-03-26 DIAGNOSIS — E1142 Type 2 diabetes mellitus with diabetic polyneuropathy: Secondary | ICD-10-CM | POA: Diagnosis not present

## 2022-03-26 DIAGNOSIS — M24542 Contracture, left hand: Secondary | ICD-10-CM | POA: Diagnosis not present

## 2022-03-26 DIAGNOSIS — E46 Unspecified protein-calorie malnutrition: Secondary | ICD-10-CM | POA: Diagnosis not present

## 2022-03-26 DIAGNOSIS — I69354 Hemiplegia and hemiparesis following cerebral infarction affecting left non-dominant side: Secondary | ICD-10-CM | POA: Diagnosis not present

## 2022-03-26 DIAGNOSIS — M1611 Unilateral primary osteoarthritis, right hip: Secondary | ICD-10-CM | POA: Diagnosis not present

## 2022-03-26 DIAGNOSIS — M48061 Spinal stenosis, lumbar region without neurogenic claudication: Secondary | ICD-10-CM | POA: Diagnosis not present

## 2022-03-28 DIAGNOSIS — M24542 Contracture, left hand: Secondary | ICD-10-CM | POA: Diagnosis not present

## 2022-03-28 DIAGNOSIS — M48061 Spinal stenosis, lumbar region without neurogenic claudication: Secondary | ICD-10-CM | POA: Diagnosis not present

## 2022-03-28 DIAGNOSIS — I69354 Hemiplegia and hemiparesis following cerebral infarction affecting left non-dominant side: Secondary | ICD-10-CM | POA: Diagnosis not present

## 2022-03-28 DIAGNOSIS — E1142 Type 2 diabetes mellitus with diabetic polyneuropathy: Secondary | ICD-10-CM | POA: Diagnosis not present

## 2022-03-28 DIAGNOSIS — E46 Unspecified protein-calorie malnutrition: Secondary | ICD-10-CM | POA: Diagnosis not present

## 2022-03-28 DIAGNOSIS — M1611 Unilateral primary osteoarthritis, right hip: Secondary | ICD-10-CM | POA: Diagnosis not present

## 2022-04-02 ENCOUNTER — Other Ambulatory Visit: Payer: Self-pay | Admitting: Student

## 2022-04-02 DIAGNOSIS — E1142 Type 2 diabetes mellitus with diabetic polyneuropathy: Secondary | ICD-10-CM | POA: Diagnosis not present

## 2022-04-02 DIAGNOSIS — M1611 Unilateral primary osteoarthritis, right hip: Secondary | ICD-10-CM | POA: Diagnosis not present

## 2022-04-02 DIAGNOSIS — M24542 Contracture, left hand: Secondary | ICD-10-CM | POA: Diagnosis not present

## 2022-04-02 DIAGNOSIS — E46 Unspecified protein-calorie malnutrition: Secondary | ICD-10-CM | POA: Diagnosis not present

## 2022-04-02 DIAGNOSIS — I69354 Hemiplegia and hemiparesis following cerebral infarction affecting left non-dominant side: Secondary | ICD-10-CM | POA: Diagnosis not present

## 2022-04-02 DIAGNOSIS — M48061 Spinal stenosis, lumbar region without neurogenic claudication: Secondary | ICD-10-CM | POA: Diagnosis not present

## 2022-04-02 NOTE — Telephone Encounter (Signed)
Trulicity is not on current med list.

## 2022-04-02 NOTE — Telephone Encounter (Signed)
Last seen by Dr. Cyndie Chime 4/13. Did not mention trulicity, only ozempic and farxiga. Will decline renewal for now. It does not appear as though she has been on trulicity for some time.

## 2022-04-04 ENCOUNTER — Other Ambulatory Visit: Payer: Self-pay | Admitting: Student

## 2022-04-04 ENCOUNTER — Ambulatory Visit (INDEPENDENT_AMBULATORY_CARE_PROVIDER_SITE_OTHER): Payer: Medicare Other | Admitting: Podiatry

## 2022-04-04 ENCOUNTER — Encounter: Payer: Self-pay | Admitting: Podiatry

## 2022-04-04 DIAGNOSIS — I6930 Unspecified sequelae of cerebral infarction: Secondary | ICD-10-CM | POA: Diagnosis not present

## 2022-04-04 DIAGNOSIS — M79675 Pain in left toe(s): Secondary | ICD-10-CM

## 2022-04-04 DIAGNOSIS — E1140 Type 2 diabetes mellitus with diabetic neuropathy, unspecified: Secondary | ICD-10-CM

## 2022-04-04 DIAGNOSIS — M79674 Pain in right toe(s): Secondary | ICD-10-CM | POA: Diagnosis not present

## 2022-04-04 DIAGNOSIS — M2042 Other hammer toe(s) (acquired), left foot: Secondary | ICD-10-CM

## 2022-04-04 DIAGNOSIS — B351 Tinea unguium: Secondary | ICD-10-CM | POA: Diagnosis not present

## 2022-04-04 DIAGNOSIS — E1149 Type 2 diabetes mellitus with other diabetic neurological complication: Secondary | ICD-10-CM

## 2022-04-04 DIAGNOSIS — L84 Corns and callosities: Secondary | ICD-10-CM | POA: Diagnosis not present

## 2022-04-04 NOTE — Progress Notes (Signed)
Subjective:   Patient ID: Nicole [name], female   DOB: 61 y.o.   MRN: [id number]   HPI Patient presents with caregiver with very thickened nailbeds 1-5 both feet elongated and painful and lesion fifth digit left that is painful with patient having diabetes and has had stroke and is in a wheelchair currently.  Patient does have high risk and is not active and does not smoke   Review of Systems  All other systems reviewed and are negative.      Objective:  Physical Exam Vitals and nursing note reviewed.  Constitutional:      Appearance: She is well-developed.  Pulmonary:     Effort: Pulmonary effort is normal.  Musculoskeletal:        General: Normal range of motion.  Skin:    General: Skin is warm.  Neurological:     Mental Status: She is alert.    Vascular status found to be mildly diminished both DP PT pulses but intact with patient having history of arthroplasty fifth digit right severe thickness of the keratotic lesion fifth digit left with severe nail disease 1-5 both feet that are thick yellow brittle painful and is using braces bilateral but currently in wheelchair.  Patient has good digital perfusion well oriented     Assessment:  High risk patient with keratotic lesion of the fifth digit left with pain and thick yellow mycotic nail infection 1-5 with pain     Plan:  Debrided nailbeds 1-5 both feet no iatrogenic bleeding noted debrided lesion left no angiogenic bleeding noted discussed we could consider arthroplasty but would like to avoid this due to her frail health if possible and her high risk factors with patient also currently on blood thinner

## 2022-04-05 ENCOUNTER — Telehealth: Payer: Self-pay | Admitting: *Deleted

## 2022-04-05 NOTE — Chronic Care Management (AMB) (Signed)
  Care Management   Outreach Note  04/05/2022 Name: Nicole [name] MRN: [id number] DOB: 02-12-1961  Referred by: Belva Agee, MD Reason for referral : Care Coordination (Initial outreach to schedule referral with BSW)   An unsuccessful telephone outreach was attempted today. The patient was referred to the case management team for assistance with care management and care coordination.   Follow Up Plan:  A HIPAA compliant phone message was left for the patient providing contact information and requesting a return call.  The care management team will reach out to the patient again over the next 7 days.  If patient returns call to provider office, please advise to call Embedded Care Management Care Guide Misty Stanley* at [phone].Gwenevere Ghazi  Care Guide, Embedded Care Coordination [facility] Management  Direct Dial: [phone]

## 2022-04-06 DIAGNOSIS — M24542 Contracture, left hand: Secondary | ICD-10-CM | POA: Diagnosis not present

## 2022-04-06 DIAGNOSIS — E46 Unspecified protein-calorie malnutrition: Secondary | ICD-10-CM | POA: Diagnosis not present

## 2022-04-06 DIAGNOSIS — M48061 Spinal stenosis, lumbar region without neurogenic claudication: Secondary | ICD-10-CM | POA: Diagnosis not present

## 2022-04-06 DIAGNOSIS — I69354 Hemiplegia and hemiparesis following cerebral infarction affecting left non-dominant side: Secondary | ICD-10-CM | POA: Diagnosis not present

## 2022-04-06 DIAGNOSIS — M1611 Unilateral primary osteoarthritis, right hip: Secondary | ICD-10-CM | POA: Diagnosis not present

## 2022-04-06 DIAGNOSIS — E1142 Type 2 diabetes mellitus with diabetic polyneuropathy: Secondary | ICD-10-CM | POA: Diagnosis not present

## 2022-04-10 ENCOUNTER — Telehealth: Payer: Self-pay | Admitting: *Deleted

## 2022-04-10 DIAGNOSIS — I69354 Hemiplegia and hemiparesis following cerebral infarction affecting left non-dominant side: Secondary | ICD-10-CM | POA: Diagnosis not present

## 2022-04-10 DIAGNOSIS — E46 Unspecified protein-calorie malnutrition: Secondary | ICD-10-CM | POA: Diagnosis not present

## 2022-04-10 DIAGNOSIS — M48061 Spinal stenosis, lumbar region without neurogenic claudication: Secondary | ICD-10-CM | POA: Diagnosis not present

## 2022-04-10 DIAGNOSIS — M24542 Contracture, left hand: Secondary | ICD-10-CM | POA: Diagnosis not present

## 2022-04-10 DIAGNOSIS — E1142 Type 2 diabetes mellitus with diabetic polyneuropathy: Secondary | ICD-10-CM | POA: Diagnosis not present

## 2022-04-10 DIAGNOSIS — M1611 Unilateral primary osteoarthritis, right hip: Secondary | ICD-10-CM | POA: Diagnosis not present

## 2022-04-10 NOTE — Telephone Encounter (Signed)
Call from [place] PT with [facility] - stated she has been working with pt with her new walker; requesting verbal order for "Continue walker training 2 x a week x 2 weeks then 1 x a week x 6 weeks". VO given - if not appropriate, let me know. Thanks

## 2022-04-10 NOTE — Chronic Care Management (AMB) (Signed)
  Care Management   Note  04/10/2022 Name: Nicole [name] MRN: [id number] DOB: [birth year]  RONEKA GILPIN is a 61 y.o. year old female who is a primary care patient of Katsadouros, Heidi Dach, MD. I reached out to [name] by phone today offer care coordination services.   Ms. Duffy was given information about care management services today including:  Care management services include personalized support from designated clinical staff supervised by her physician, including individualized plan of care and coordination with other care providers 24/7 contact phone numbers for assistance for urgent and routine care needs. The patient may stop care management services at any time by phone call to the office staff.  Patient agreed to services and verbal consent obtained.   Follow up plan: Telephone appointment with care management team member scheduled for:04/27/22  [facility] Guide, Embedded Care Coordination [facility]  Care Management  Direct Dial: [phone]

## 2022-04-11 ENCOUNTER — Telehealth: Payer: Self-pay

## 2022-04-11 NOTE — Telephone Encounter (Signed)
Xanax  [id number] - [place], [place] - [address]

## 2022-04-11 NOTE — Telephone Encounter (Signed)
Call to patient about request for Xanax.  Not on patient's med list.  Call to see if she has ever taken and who prescribed for her. Message to return call about prescription request.

## 2022-04-12 DIAGNOSIS — Z6831 Body mass index (BMI) 31.0-31.9, adult: Secondary | ICD-10-CM | POA: Diagnosis not present

## 2022-04-12 DIAGNOSIS — E46 Unspecified protein-calorie malnutrition: Secondary | ICD-10-CM | POA: Diagnosis not present

## 2022-04-12 DIAGNOSIS — Z7982 Long term (current) use of aspirin: Secondary | ICD-10-CM | POA: Diagnosis not present

## 2022-04-12 DIAGNOSIS — M1611 Unilateral primary osteoarthritis, right hip: Secondary | ICD-10-CM | POA: Diagnosis not present

## 2022-04-12 DIAGNOSIS — Z7985 Long-term (current) use of injectable non-insulin antidiabetic drugs: Secondary | ICD-10-CM | POA: Diagnosis not present

## 2022-04-12 DIAGNOSIS — Z9181 History of falling: Secondary | ICD-10-CM | POA: Diagnosis not present

## 2022-04-12 DIAGNOSIS — I4891 Unspecified atrial fibrillation: Secondary | ICD-10-CM | POA: Diagnosis not present

## 2022-04-12 DIAGNOSIS — M48061 Spinal stenosis, lumbar region without neurogenic claudication: Secondary | ICD-10-CM | POA: Diagnosis not present

## 2022-04-12 DIAGNOSIS — R296 Repeated falls: Secondary | ICD-10-CM | POA: Diagnosis not present

## 2022-04-12 DIAGNOSIS — E1142 Type 2 diabetes mellitus with diabetic polyneuropathy: Secondary | ICD-10-CM | POA: Diagnosis not present

## 2022-04-12 DIAGNOSIS — L409 Psoriasis, unspecified: Secondary | ICD-10-CM | POA: Diagnosis not present

## 2022-04-12 DIAGNOSIS — B182 Chronic viral hepatitis C: Secondary | ICD-10-CM | POA: Diagnosis not present

## 2022-04-12 DIAGNOSIS — Z21 Asymptomatic human immunodeficiency virus [HIV] infection status: Secondary | ICD-10-CM | POA: Diagnosis not present

## 2022-04-12 DIAGNOSIS — I10 Essential (primary) hypertension: Secondary | ICD-10-CM | POA: Diagnosis not present

## 2022-04-12 DIAGNOSIS — E785 Hyperlipidemia, unspecified: Secondary | ICD-10-CM | POA: Diagnosis not present

## 2022-04-12 DIAGNOSIS — F32A Depression, unspecified: Secondary | ICD-10-CM | POA: Diagnosis not present

## 2022-04-12 DIAGNOSIS — I69354 Hemiplegia and hemiparesis following cerebral infarction affecting left non-dominant side: Secondary | ICD-10-CM | POA: Diagnosis not present

## 2022-04-12 DIAGNOSIS — E876 Hypokalemia: Secondary | ICD-10-CM | POA: Diagnosis not present

## 2022-04-12 DIAGNOSIS — M24542 Contracture, left hand: Secondary | ICD-10-CM | POA: Diagnosis not present

## 2022-04-12 DIAGNOSIS — Z87820 Personal history of traumatic brain injury: Secondary | ICD-10-CM | POA: Diagnosis not present

## 2022-04-12 DIAGNOSIS — J309 Allergic rhinitis, unspecified: Secondary | ICD-10-CM | POA: Diagnosis not present

## 2022-04-12 DIAGNOSIS — K219 Gastro-esophageal reflux disease without esophagitis: Secondary | ICD-10-CM | POA: Diagnosis not present

## 2022-04-12 DIAGNOSIS — F17210 Nicotine dependence, cigarettes, uncomplicated: Secondary | ICD-10-CM | POA: Diagnosis not present

## 2022-04-13 ENCOUNTER — Other Ambulatory Visit: Payer: Self-pay | Admitting: Student

## 2022-04-13 DIAGNOSIS — I6930 Unspecified sequelae of cerebral infarction: Secondary | ICD-10-CM

## 2022-04-13 NOTE — Addendum Note (Signed)
Addended by: Riesa Pope on: 04/13/2022 04:38 AM   Modules accepted: Orders

## 2022-04-13 NOTE — Telephone Encounter (Signed)
Thank you. Can we please schedule her to be seen in clinic for this? I have continuity clinic at the end of the month, but if she would like to discuss sooner can see another physician on the team. Would like to talk with her about this before initiating. Thank you

## 2022-04-13 NOTE — Telephone Encounter (Signed)
RTC to patient Xanax was on when she was in Rehab.  Stated got a [facility] for when [facility]  [facility] as patient did not know the dose she was on.  Told patient that if she could figure this out it would be helpful.  Did not want a dosage that would make her to sleepy.  Just wants to calm her nerves.

## 2022-04-16 ENCOUNTER — Ambulatory Visit: Payer: Medicare Other | Admitting: Licensed Clinical Social Worker

## 2022-04-16 NOTE — Chronic Care Management (AMB) (Signed)
  Care Management   Social Work Visit Note  04/16/2022 Name: KAYLAMARIE AKPAN MRN: [id number] DOB: Sep 09, 1961  TALULAH FIEL is a 61 y.o. year old female who sees Riesa Pope, MD for primary care. The care management team was consulted for assistance with care management and care coordination needs related to [facility] Resources    Patient was given the following information about care management and care coordination services today, agreed to services, and gave verbal consent: 1.care management/care coordination services include personalized support from designated clinical staff supervised by their physician, including individualized plan of care and coordination with other care providers 2. 24/7 contact phone numbers for assistance for urgent and routine care needs. 3. The patient may stop care management/care coordination services at any time by phone call to the office staff.  Engaged with patient by telephone for follow up visit in response to provider referral for social work chronic care management and care coordination services.  Assessment: Review of patient history, allergies, and health status during evaluation of patient need for care management/care coordination services.    Interventions:  Patient interviewed and appropriate assessments performed Collaborated with clinical team regarding patient needs    SDOH (Social Determinants of Health) assessments performed: Yes     Lenor Derrick , MSW Social Worker [facility] Care Management  [phone]

## 2022-04-17 DIAGNOSIS — I69354 Hemiplegia and hemiparesis following cerebral infarction affecting left non-dominant side: Secondary | ICD-10-CM | POA: Diagnosis not present

## 2022-04-17 DIAGNOSIS — I10 Essential (primary) hypertension: Secondary | ICD-10-CM | POA: Diagnosis not present

## 2022-04-17 DIAGNOSIS — M24542 Contracture, left hand: Secondary | ICD-10-CM | POA: Diagnosis not present

## 2022-04-17 DIAGNOSIS — E1142 Type 2 diabetes mellitus with diabetic polyneuropathy: Secondary | ICD-10-CM | POA: Diagnosis not present

## 2022-04-17 DIAGNOSIS — E46 Unspecified protein-calorie malnutrition: Secondary | ICD-10-CM | POA: Diagnosis not present

## 2022-04-17 DIAGNOSIS — M48061 Spinal stenosis, lumbar region without neurogenic claudication: Secondary | ICD-10-CM | POA: Diagnosis not present

## 2022-04-19 DIAGNOSIS — E1142 Type 2 diabetes mellitus with diabetic polyneuropathy: Secondary | ICD-10-CM | POA: Diagnosis not present

## 2022-04-19 DIAGNOSIS — E46 Unspecified protein-calorie malnutrition: Secondary | ICD-10-CM | POA: Diagnosis not present

## 2022-04-19 DIAGNOSIS — I69354 Hemiplegia and hemiparesis following cerebral infarction affecting left non-dominant side: Secondary | ICD-10-CM | POA: Diagnosis not present

## 2022-04-19 DIAGNOSIS — M48061 Spinal stenosis, lumbar region without neurogenic claudication: Secondary | ICD-10-CM | POA: Diagnosis not present

## 2022-04-19 DIAGNOSIS — I10 Essential (primary) hypertension: Secondary | ICD-10-CM | POA: Diagnosis not present

## 2022-04-19 DIAGNOSIS — M24542 Contracture, left hand: Secondary | ICD-10-CM | POA: Diagnosis not present

## 2022-04-23 ENCOUNTER — Other Ambulatory Visit: Payer: Self-pay

## 2022-04-23 DIAGNOSIS — I10 Essential (primary) hypertension: Secondary | ICD-10-CM | POA: Diagnosis not present

## 2022-04-23 DIAGNOSIS — I69354 Hemiplegia and hemiparesis following cerebral infarction affecting left non-dominant side: Secondary | ICD-10-CM | POA: Diagnosis not present

## 2022-04-23 DIAGNOSIS — M48061 Spinal stenosis, lumbar region without neurogenic claudication: Secondary | ICD-10-CM | POA: Diagnosis not present

## 2022-04-23 DIAGNOSIS — M24542 Contracture, left hand: Secondary | ICD-10-CM | POA: Diagnosis not present

## 2022-04-23 DIAGNOSIS — E876 Hypokalemia: Secondary | ICD-10-CM

## 2022-04-23 DIAGNOSIS — E1142 Type 2 diabetes mellitus with diabetic polyneuropathy: Secondary | ICD-10-CM | POA: Diagnosis not present

## 2022-04-23 DIAGNOSIS — F17200 Nicotine dependence, unspecified, uncomplicated: Secondary | ICD-10-CM

## 2022-04-23 DIAGNOSIS — E119 Type 2 diabetes mellitus without complications: Secondary | ICD-10-CM

## 2022-04-23 DIAGNOSIS — K219 Gastro-esophageal reflux disease without esophagitis: Secondary | ICD-10-CM

## 2022-04-23 DIAGNOSIS — E46 Unspecified protein-calorie malnutrition: Secondary | ICD-10-CM | POA: Diagnosis not present

## 2022-04-23 NOTE — Telephone Encounter (Signed)
Please refill all rx   [id number] - [place], [place] - [address] [facility] [address]  [address] [place], [place] [address]  Phone:  [phone]  Fax:  [phone]

## 2022-04-24 ENCOUNTER — Ambulatory Visit (INDEPENDENT_AMBULATORY_CARE_PROVIDER_SITE_OTHER): Payer: Medicare Other | Admitting: Internal Medicine

## 2022-04-24 DIAGNOSIS — F17200 Nicotine dependence, unspecified, uncomplicated: Secondary | ICD-10-CM

## 2022-04-24 DIAGNOSIS — F17210 Nicotine dependence, cigarettes, uncomplicated: Secondary | ICD-10-CM

## 2022-04-24 DIAGNOSIS — F419 Anxiety disorder, unspecified: Secondary | ICD-10-CM | POA: Diagnosis not present

## 2022-04-24 MED ORDER — POTASSIUM CHLORIDE CRYS ER 20 MEQ PO TBCR: 20.0000 meq | EXTENDED_RELEASE_TABLET | Freq: Every day | ORAL | 0 refills | Status: DC

## 2022-04-24 MED ORDER — ACCU-CHEK GUIDE VI STRP: ORAL_STRIP | 4 refills | Status: DC

## 2022-04-24 MED ORDER — BUPROPION HCL ER (SR) 150 MG PO TB12: 150.0000 mg | ORAL_TABLET | Freq: Every day | ORAL | 1 refills | Status: DC

## 2022-04-24 MED ORDER — OZEMPIC (0.25 OR 0.5 MG/DOSE) 2 MG/1.5ML ~~LOC~~ SOPN: 0.5000 mg | PEN_INJECTOR | SUBCUTANEOUS | 3 refills | Status: DC

## 2022-04-24 MED ORDER — OMEPRAZOLE MAGNESIUM 20 MG PO TBEC: 40.0000 mg | DELAYED_RELEASE_TABLET | Freq: Every day | ORAL | 1 refills | Status: DC

## 2022-04-24 MED ORDER — ROSUVASTATIN CALCIUM 20 MG PO TABS
20.0000 mg | ORAL_TABLET | Freq: Every day | ORAL | 1 refills | Status: DC
Start: 2022-04-24 — End: 2022-12-05

## 2022-04-24 MED ORDER — HYDROXYZINE HCL 10 MG PO TABS: 10.0000 mg | ORAL_TABLET | Freq: Three times a day (TID) | ORAL | 0 refills | Status: DC | PRN

## 2022-04-24 MED ORDER — ESCITALOPRAM OXALATE 20 MG PO TABS: ORAL_TABLET | ORAL | 2 refills | Status: DC

## 2022-04-24 MED ORDER — ESCITALOPRAM OXALATE 10 MG PO TABS: 10.0000 mg | ORAL_TABLET | Freq: Every day | ORAL | 2 refills | Status: DC

## 2022-04-24 MED ORDER — GABAPENTIN 300 MG PO CAPS
300.0000 mg | ORAL_CAPSULE | Freq: Every day | ORAL | 3 refills | Status: DC
Start: 2022-04-24 — End: 2023-08-30

## 2022-04-24 MED ORDER — LOSARTAN POTASSIUM-HCTZ 50-12.5 MG PO TABS: 1.0000 | ORAL_TABLET | Freq: Every day | ORAL | 3 refills | Status: DC

## 2022-04-24 MED ORDER — DAPAGLIFLOZIN PROPANEDIOL 10 MG PO TABS: 10.0000 mg | ORAL_TABLET | Freq: Every day | ORAL | 3 refills | Status: DC
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
## 2022-04-24 NOTE — Assessment & Plan Note (Addendum)
Acute tele-health visit today for anxiety. Pt reports that she has noticed an increase amount of anxiety during the last 1-2 months, and attributes it to her health conditions (HTN, DM, CVA) as well as stressors including the death of her mother and sister. PHQ2 negative, no concern for depression currently. She states that her sxs are limited to daytime anxiety, particularly when dwelling in her thoughts. She requests medication for anxiety.   -- Lexapro prescribed; instructed to take 10 mg daily for 1 week and then self-titrate up to 20 mg daily.  -- Hydroxyzine 10 mg TID prn prescribed; instructed to avoid during the day as it can cause somnolence.

## 2022-04-24 NOTE — Patient Instructions (Signed)
Start taking 1/2 pill of Lexapro daily for 1 week and then start taking 1 full pill daily.  You can take Hydroxyzine 1 tablet three times a day AS NEEDED -- do not take if you do not need it. Do not take during the day time if it is making your sleepy.

## 2022-04-26 DIAGNOSIS — M48061 Spinal stenosis, lumbar region without neurogenic claudication: Secondary | ICD-10-CM | POA: Diagnosis not present

## 2022-04-26 DIAGNOSIS — M24542 Contracture, left hand: Secondary | ICD-10-CM | POA: Diagnosis not present

## 2022-04-26 DIAGNOSIS — E1142 Type 2 diabetes mellitus with diabetic polyneuropathy: Secondary | ICD-10-CM | POA: Diagnosis not present

## 2022-04-26 DIAGNOSIS — I10 Essential (primary) hypertension: Secondary | ICD-10-CM | POA: Diagnosis not present

## 2022-04-26 DIAGNOSIS — E46 Unspecified protein-calorie malnutrition: Secondary | ICD-10-CM | POA: Diagnosis not present

## 2022-04-26 DIAGNOSIS — I69354 Hemiplegia and hemiparesis following cerebral infarction affecting left non-dominant side: Secondary | ICD-10-CM | POA: Diagnosis not present

## 2022-04-27 ENCOUNTER — Telehealth: Payer: Medicare Other

## 2022-04-27 NOTE — Progress Notes (Signed)
Internal Medicine Clinic Attending  Case discussed with the resident at the time of the visit.  We reviewed the resident's history and exam and pertinent patient test results.  I agree with the assessment, diagnosis, and plan of care documented in the resident's note.  

## 2022-04-30 DIAGNOSIS — M24542 Contracture, left hand: Secondary | ICD-10-CM | POA: Diagnosis not present

## 2022-04-30 DIAGNOSIS — E46 Unspecified protein-calorie malnutrition: Secondary | ICD-10-CM | POA: Diagnosis not present

## 2022-04-30 DIAGNOSIS — I10 Essential (primary) hypertension: Secondary | ICD-10-CM | POA: Diagnosis not present

## 2022-04-30 DIAGNOSIS — I69354 Hemiplegia and hemiparesis following cerebral infarction affecting left non-dominant side: Secondary | ICD-10-CM | POA: Diagnosis not present

## 2022-04-30 DIAGNOSIS — E1142 Type 2 diabetes mellitus with diabetic polyneuropathy: Secondary | ICD-10-CM | POA: Diagnosis not present

## 2022-04-30 DIAGNOSIS — M48061 Spinal stenosis, lumbar region without neurogenic claudication: Secondary | ICD-10-CM | POA: Diagnosis not present

## 2022-05-03 ENCOUNTER — Other Ambulatory Visit: Payer: Self-pay | Admitting: Internal Medicine

## 2022-05-03 DIAGNOSIS — B20 Human immunodeficiency virus [HIV] disease: Secondary | ICD-10-CM

## 2022-05-03 NOTE — Telephone Encounter (Signed)
Patient overdue for office appointment. No VM box to leave a message.

## 2022-05-04 ENCOUNTER — Other Ambulatory Visit: Payer: Self-pay

## 2022-05-04 ENCOUNTER — Ambulatory Visit (AMBULATORY_SURGERY_CENTER): Payer: Self-pay

## 2022-05-04 VITALS — Ht 66.0 in | Wt 215.0 lb

## 2022-05-04 DIAGNOSIS — Z1211 Encounter for screening for malignant neoplasm of colon: Secondary | ICD-10-CM

## 2022-05-04 MED ORDER — NA SULFATE-K SULFATE-MG SULF 17.5-3.13-1.6 GM/177ML PO SOLN: 1.0000 | Freq: Once | ORAL | 0 refills | Status: AC

## 2022-05-04 NOTE — Progress Notes (Signed)
Denies allergies to eggs or soy products. Denies complication of anesthesia or sedation. Denies use of weight loss medication. Denies use of O2.   Emmi instructions given for colonoscopy.  

## 2022-05-07 ENCOUNTER — Telehealth: Payer: Self-pay

## 2022-05-09 ENCOUNTER — Telehealth: Payer: Self-pay | Admitting: *Deleted

## 2022-05-09 ENCOUNTER — Encounter: Payer: Medicare Other | Admitting: Student

## 2022-05-09 DIAGNOSIS — I69354 Hemiplegia and hemiparesis following cerebral infarction affecting left non-dominant side: Secondary | ICD-10-CM | POA: Diagnosis not present

## 2022-05-09 DIAGNOSIS — E46 Unspecified protein-calorie malnutrition: Secondary | ICD-10-CM | POA: Diagnosis not present

## 2022-05-09 DIAGNOSIS — M48061 Spinal stenosis, lumbar region without neurogenic claudication: Secondary | ICD-10-CM | POA: Diagnosis not present

## 2022-05-09 DIAGNOSIS — M24542 Contracture, left hand: Secondary | ICD-10-CM | POA: Diagnosis not present

## 2022-05-09 DIAGNOSIS — E1142 Type 2 diabetes mellitus with diabetic polyneuropathy: Secondary | ICD-10-CM | POA: Diagnosis not present

## 2022-05-09 DIAGNOSIS — I10 Essential (primary) hypertension: Secondary | ICD-10-CM | POA: Diagnosis not present

## 2022-05-09 NOTE — Telephone Encounter (Signed)
Spoke with patient regarding her missed appointment. Patient to call the main number [phone] to reschedule.

## 2022-05-10 ENCOUNTER — Telehealth: Payer: Self-pay

## 2022-05-10 NOTE — Telephone Encounter (Signed)
Marcie Bal from [facility] called [phone]

## 2022-05-10 NOTE — Telephone Encounter (Signed)
Per PCP, [facility] has been trying to contact patient for appt before refilling any of her meds. Call placed to patient and gave contact number to [facility] [phone]. Patient was able to correctly repeat number back. States she will call them now.

## 2022-05-10 NOTE — Telephone Encounter (Signed)
Called back to [place] who will remind patient of PCP appt on 7/10 and need to schedule appt with [facility].

## 2022-05-10 NOTE — Telephone Encounter (Signed)
Returned call to [place], PT with [facility]. She wanted to report patient has had 2 recent falls, and increased edema in legs, R>L. Granddaughter reported patient has not been getting up as often as she had been, nor voiding as often. Patient has been going to day program for 2-3 hours per day; unsure if this is PACE. States patient has all meds in home except needs refill on ASA. Patient's last OV (tele) 6/13 for acute anxiety and started on Lexapro and atarax. Marcie Bal states she has titrated up to 20 mg Lexapro daily, but did not see atarax in home.    Call placed to patient to offer appt for tomorrow AM. States she cannot come before 7/5 due to transportation. Call transferred to FO to schedule appt for next week.

## 2022-05-10 NOTE — Telephone Encounter (Signed)
Thank you. If she follows up with [place] first, will go ahead and get blood work for CD4 levels and HIV RNA. Thanks

## 2022-05-12 DIAGNOSIS — F32A Depression, unspecified: Secondary | ICD-10-CM | POA: Diagnosis not present

## 2022-05-12 DIAGNOSIS — M24542 Contracture, left hand: Secondary | ICD-10-CM | POA: Diagnosis not present

## 2022-05-12 DIAGNOSIS — I4891 Unspecified atrial fibrillation: Secondary | ICD-10-CM | POA: Diagnosis not present

## 2022-05-12 DIAGNOSIS — E876 Hypokalemia: Secondary | ICD-10-CM | POA: Diagnosis not present

## 2022-05-12 DIAGNOSIS — Z7982 Long term (current) use of aspirin: Secondary | ICD-10-CM | POA: Diagnosis not present

## 2022-05-12 DIAGNOSIS — F17210 Nicotine dependence, cigarettes, uncomplicated: Secondary | ICD-10-CM | POA: Diagnosis not present

## 2022-05-12 DIAGNOSIS — M1611 Unilateral primary osteoarthritis, right hip: Secondary | ICD-10-CM | POA: Diagnosis not present

## 2022-05-12 DIAGNOSIS — B182 Chronic viral hepatitis C: Secondary | ICD-10-CM | POA: Diagnosis not present

## 2022-05-12 DIAGNOSIS — Z9181 History of falling: Secondary | ICD-10-CM | POA: Diagnosis not present

## 2022-05-12 DIAGNOSIS — E1142 Type 2 diabetes mellitus with diabetic polyneuropathy: Secondary | ICD-10-CM | POA: Diagnosis not present

## 2022-05-12 DIAGNOSIS — J309 Allergic rhinitis, unspecified: Secondary | ICD-10-CM | POA: Diagnosis not present

## 2022-05-12 DIAGNOSIS — R296 Repeated falls: Secondary | ICD-10-CM | POA: Diagnosis not present

## 2022-05-12 DIAGNOSIS — Z87820 Personal history of traumatic brain injury: Secondary | ICD-10-CM | POA: Diagnosis not present

## 2022-05-12 DIAGNOSIS — I69354 Hemiplegia and hemiparesis following cerebral infarction affecting left non-dominant side: Secondary | ICD-10-CM | POA: Diagnosis not present

## 2022-05-12 DIAGNOSIS — M48061 Spinal stenosis, lumbar region without neurogenic claudication: Secondary | ICD-10-CM | POA: Diagnosis not present

## 2022-05-12 DIAGNOSIS — L409 Psoriasis, unspecified: Secondary | ICD-10-CM | POA: Diagnosis not present

## 2022-05-12 DIAGNOSIS — Z21 Asymptomatic human immunodeficiency virus [HIV] infection status: Secondary | ICD-10-CM | POA: Diagnosis not present

## 2022-05-12 DIAGNOSIS — I10 Essential (primary) hypertension: Secondary | ICD-10-CM | POA: Diagnosis not present

## 2022-05-12 DIAGNOSIS — Z7985 Long-term (current) use of injectable non-insulin antidiabetic drugs: Secondary | ICD-10-CM | POA: Diagnosis not present

## 2022-05-12 DIAGNOSIS — Z6831 Body mass index (BMI) 31.0-31.9, adult: Secondary | ICD-10-CM | POA: Diagnosis not present

## 2022-05-12 DIAGNOSIS — E785 Hyperlipidemia, unspecified: Secondary | ICD-10-CM | POA: Diagnosis not present

## 2022-05-12 DIAGNOSIS — E46 Unspecified protein-calorie malnutrition: Secondary | ICD-10-CM | POA: Diagnosis not present

## 2022-05-12 DIAGNOSIS — K219 Gastro-esophageal reflux disease without esophagitis: Secondary | ICD-10-CM | POA: Diagnosis not present

## 2022-05-16 ENCOUNTER — Other Ambulatory Visit: Payer: Self-pay | Admitting: Internal Medicine

## 2022-05-16 DIAGNOSIS — F419 Anxiety disorder, unspecified: Secondary | ICD-10-CM

## 2022-05-17 ENCOUNTER — Encounter: Payer: Medicare Other | Admitting: Student

## 2022-05-18 ENCOUNTER — Other Ambulatory Visit: Payer: Self-pay

## 2022-05-18 DIAGNOSIS — B20 Human immunodeficiency virus [HIV] disease: Secondary | ICD-10-CM

## 2022-05-18 DIAGNOSIS — Z113 Encounter for screening for infections with a predominantly sexual mode of transmission: Secondary | ICD-10-CM

## 2022-05-21 ENCOUNTER — Other Ambulatory Visit: Payer: Self-pay

## 2022-05-21 ENCOUNTER — Other Ambulatory Visit: Payer: Medicare Other

## 2022-05-21 ENCOUNTER — Ambulatory Visit (INDEPENDENT_AMBULATORY_CARE_PROVIDER_SITE_OTHER): Payer: Medicare Other | Admitting: Student

## 2022-05-21 VITALS — BP 153/94 | HR 90 | Temp 97.6°F | Ht 66.0 in | Wt 213.4 lb

## 2022-05-21 DIAGNOSIS — I6930 Unspecified sequelae of cerebral infarction: Secondary | ICD-10-CM

## 2022-05-21 DIAGNOSIS — E119 Type 2 diabetes mellitus without complications: Secondary | ICD-10-CM

## 2022-05-21 DIAGNOSIS — B20 Human immunodeficiency virus [HIV] disease: Secondary | ICD-10-CM

## 2022-05-21 DIAGNOSIS — Z7985 Long-term (current) use of injectable non-insulin antidiabetic drugs: Secondary | ICD-10-CM | POA: Diagnosis not present

## 2022-05-21 DIAGNOSIS — F17210 Nicotine dependence, cigarettes, uncomplicated: Secondary | ICD-10-CM | POA: Diagnosis not present

## 2022-05-21 DIAGNOSIS — I10 Essential (primary) hypertension: Secondary | ICD-10-CM

## 2022-05-21 LAB — GLUCOSE, CAPILLARY: Glucose-Capillary: 107 mg/dL — ABNORMAL HIGH (ref 70–99)

## 2022-05-21 MED ORDER — DAPAGLIFLOZIN PROPANEDIOL 10 MG PO TABS: 10.0000 mg | ORAL_TABLET | Freq: Every day | ORAL | 1 refills | Status: DC

## 2022-05-21 MED ORDER — ASPIRIN 81 MG PO TBEC: 81.0000 mg | DELAYED_RELEASE_TABLET | Freq: Every day | ORAL | 3 refills | Status: AC

## 2022-05-21 MED ORDER — OZEMPIC (0.25 OR 0.5 MG/DOSE) 2 MG/1.5ML ~~LOC~~ SOPN: 0.5000 mg | PEN_INJECTOR | SUBCUTANEOUS | 3 refills | Status: DC

## 2022-05-21 NOTE — Assessment & Plan Note (Signed)
Assessment: Doing well on Genvoya, has follow up with Dr. Orvan Falconer of ID next week.   Plan: -Continue Genvoya and follow up with ID

## 2022-05-21 NOTE — Patient Instructions (Signed)
Thank you, [name] for allowing [place] to provide your care today. Today we discussed .  Diabetes Please continue your ozempic and farxiga  High blood pressure Please continue your losartan-hctz. If you find yourself having blood pressures over 130/80, please call our clinic.    HIV Please follow up with Dr. Orvan Falconer   We will work to increase your home health services  I have ordered the following labs for you:   Lab Orders         POC Hbg A1C       Referrals ordered today:   Referral Orders  No referral(s) requested today     I have ordered the following medication/changed the following medications:   Stop the following medications: There are no discontinued medications.   Start the following medications: No orders of the defined types were placed in this encounter.    Follow up: 3 months   Should you have any questions or concerns please call the internal medicine clinic at [phone].    Thalia Bloodgood, D.O. [facility] Internal Medicine Center

## 2022-05-21 NOTE — Assessment & Plan Note (Signed)
Assessment: A1c drawn today. Patient has continued on farxiga 10 mg daily. She thought ozempic was discontinued however during prior visits insulin discontinued. We discussed restarting this medication.   Plan: -follow up A1c -continue farxiga 10 mg and restart ozempic .5mg   Addendum: A1c of 6.4%. Repeat in 3 month

## 2022-05-21 NOTE — Assessment & Plan Note (Signed)
Assessment: BP of 153/94, elevated at list visit as well. Current regimen of losartan HCTZ 50-12.5 mg daily. Discussed with patient and her sister would like to have home nurse repeat pressures daily and call us if BP remains elevated above 130/80.   Plan: -continue losartan-hctz 50-12.5mg  -if persistently elevated blood pressure, consider addition of amloidpine
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
## 2022-05-21 NOTE — Assessment & Plan Note (Addendum)
Assessment: Doing well after discharged from SNF. Continuing to work with physical therapy and recently started at adult day program which has helped with her mental health and overall mood. She has continued with medication management of her diabetes, hypertension and hyperlipidemia without complications. She has yet to have CTA head and neck done from order in 12/22. Will reorder to complete chronic CVA workup    Plan: -continue 81 mg aspirin daily -continue HTN,HLD, and DM management -follow up CTA head/neck -continue PT and home health services  CTA head/neck with mild atherosclerosis and no abnormal flow  Addendum: Patient in need of AFO brace, unfortunately did not return call to company and note from visit on 07/23 is out of date. Patient continues to need evaluation for new AFO's, will place new DME order and resend office notes.

## 2022-05-22 DIAGNOSIS — I69354 Hemiplegia and hemiparesis following cerebral infarction affecting left non-dominant side: Secondary | ICD-10-CM | POA: Diagnosis not present

## 2022-05-22 DIAGNOSIS — E1142 Type 2 diabetes mellitus with diabetic polyneuropathy: Secondary | ICD-10-CM | POA: Diagnosis not present

## 2022-05-22 DIAGNOSIS — I10 Essential (primary) hypertension: Secondary | ICD-10-CM | POA: Diagnosis not present

## 2022-05-22 DIAGNOSIS — M48061 Spinal stenosis, lumbar region without neurogenic claudication: Secondary | ICD-10-CM | POA: Diagnosis not present

## 2022-05-22 DIAGNOSIS — M24542 Contracture, left hand: Secondary | ICD-10-CM | POA: Diagnosis not present

## 2022-05-22 DIAGNOSIS — E46 Unspecified protein-calorie malnutrition: Secondary | ICD-10-CM | POA: Diagnosis not present

## 2022-05-22 LAB — POCT GLYCOSYLATED HEMOGLOBIN (HGB A1C): Hemoglobin A1C: 6.4 % — AB (ref 4.0–5.6)

## 2022-05-22 NOTE — Addendum Note (Signed)
Addended by: Maura Crandall on: 05/22/2022 12:07 PM   Modules accepted: Orders

## 2022-05-23 ENCOUNTER — Telehealth: Payer: Self-pay | Admitting: Student

## 2022-05-23 NOTE — Progress Notes (Signed)
Internal Medicine Clinic Attending  Case discussed with Dr. Katsadouros  At the time of the visit.  We reviewed the resident's history and exam and pertinent patient test results.  I agree with the assessment, diagnosis, and plan of care documented in the resident's note.  

## 2022-05-23 NOTE — Telephone Encounter (Signed)
Pt requesting a call back about her lab work. °

## 2022-05-23 NOTE — Telephone Encounter (Signed)
Called and discussed A1c results. Patient asked about HIV labs however she will have these done at Dr. Blair Dolphin office.

## 2022-05-28 DIAGNOSIS — M24542 Contracture, left hand: Secondary | ICD-10-CM | POA: Diagnosis not present

## 2022-05-28 DIAGNOSIS — E1142 Type 2 diabetes mellitus with diabetic polyneuropathy: Secondary | ICD-10-CM | POA: Diagnosis not present

## 2022-05-28 DIAGNOSIS — M48061 Spinal stenosis, lumbar region without neurogenic claudication: Secondary | ICD-10-CM | POA: Diagnosis not present

## 2022-05-28 DIAGNOSIS — E46 Unspecified protein-calorie malnutrition: Secondary | ICD-10-CM | POA: Diagnosis not present

## 2022-05-28 DIAGNOSIS — I10 Essential (primary) hypertension: Secondary | ICD-10-CM | POA: Diagnosis not present

## 2022-05-28 DIAGNOSIS — I69354 Hemiplegia and hemiparesis following cerebral infarction affecting left non-dominant side: Secondary | ICD-10-CM | POA: Diagnosis not present

## 2022-05-29 ENCOUNTER — Encounter: Payer: Self-pay | Admitting: Gastroenterology

## 2022-06-01 ENCOUNTER — Encounter: Payer: Self-pay | Admitting: Gastroenterology

## 2022-06-01 ENCOUNTER — Ambulatory Visit (AMBULATORY_SURGERY_CENTER): Payer: Medicare Other | Admitting: Gastroenterology

## 2022-06-01 VITALS — BP 176/88 | HR 64 | Temp 96.8°F | Resp 12 | Ht 60.0 in | Wt 215.0 lb

## 2022-06-01 DIAGNOSIS — F32A Depression, unspecified: Secondary | ICD-10-CM | POA: Diagnosis not present

## 2022-06-01 DIAGNOSIS — Z1211 Encounter for screening for malignant neoplasm of colon: Secondary | ICD-10-CM

## 2022-06-01 DIAGNOSIS — E119 Type 2 diabetes mellitus without complications: Secondary | ICD-10-CM | POA: Diagnosis not present

## 2022-06-01 DIAGNOSIS — F419 Anxiety disorder, unspecified: Secondary | ICD-10-CM | POA: Diagnosis not present

## 2022-06-01 MED ORDER — SODIUM CHLORIDE 0.9 % IV SOLN: 500.0000 mL | INTRAVENOUS | Status: DC

## 2022-06-01 NOTE — Patient Instructions (Signed)
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
## 2022-06-01 NOTE — Op Note (Signed)
[facility] Patient Name: Nicole [name] Procedure Date: 06/01/2022 10:48 AM MRN: [id number] Endoscopist: Rachael Fee , MD Age: 61 Referring MD:  Date of Birth: 05/24/1961 Gender: Female Account #: [id number] Procedure:                Colonoscopy Indications:              Screening for colorectal malignant neoplasm Medicines:                Monitored Anesthesia Care Procedure:                Pre-Anesthesia Assessment:                           - Prior to the procedure, a History and Physical                            was performed, and patient medications and                            allergies were reviewed. The patient's tolerance of                            previous anesthesia was also reviewed. The risks                            and benefits of the procedure and the sedation                            options and risks were discussed with the patient.                            All questions were answered, and informed consent                            was obtained. Prior Anticoagulants: The patient has                            taken no previous anticoagulant or antiplatelet                            agents. ASA Grade Assessment: III - A patient with                            severe systemic disease. After reviewing the risks                            and benefits, the patient was deemed in                            satisfactory condition to undergo the procedure.                           After obtaining informed consent, the colonoscope  was passed under direct vision. Throughout the                            procedure, the patient's blood pressure, pulse, and                            oxygen saturations were monitored continuously. The                            Olympus CF-HQ190L (Serial# [id number]) Colonoscope was                            introduced through the anus and advanced to the the                            cecum,  identified by appendiceal orifice and                            ileocecal valve. The colonoscopy was performed                            without difficulty. The patient tolerated the                            procedure well. The quality of the bowel                            preparation was good. The ileocecal valve,                            appendiceal orifice, and rectum were photographed. Scope In: 10:49:38 AM Scope Out: 11:02:59 AM Scope Withdrawal Time: 0 hours 6 minutes 2 seconds  Total Procedure Duration: 0 hours 13 minutes 21 seconds  Findings:                 A few small and large-mouthed diverticula were                            found in the left colon.                           The exam was otherwise without abnormality on                            direct and retroflexion views. Complications:            No immediate complications. Estimated blood loss:                            None. Estimated Blood Loss:     Estimated blood loss: none. Impression:               - Diverticulosis in the left colon.                           - The examination was otherwise  normal on direct                            and retroflexion views.                           - No polyps or cancers. Recommendation:           - Patient has a contact number available for                            emergencies. The signs and symptoms of potential                            delayed complications were discussed with the                            patient. Return to normal activities tomorrow.                            Written discharge instructions were provided to the                            patient.                           - Resume previous diet.                           - Continue present medications.                           - Repeat colonoscopy in 10 years for screening.                            This should be done in hospital endo center because                            she has a  very difficult time tranferring in and                            out of beds, walking, etc. Rachael Fee, MD 06/01/2022 11:07:51 AM This report has been signed electronically.

## 2022-06-01 NOTE — Progress Notes (Signed)
1040 Pt could not reposition or turn self in bed.  CRNA and RN had to reposition for colonoscopy 100%.  Pt can not use one leg and 1 arm.

## 2022-06-01 NOTE — Progress Notes (Signed)
Report given to PACU, vss 

## 2022-06-04 ENCOUNTER — Telehealth: Payer: Self-pay

## 2022-06-04 ENCOUNTER — Ambulatory Visit (HOSPITAL_COMMUNITY)
Admission: RE | Admit: 2022-06-04 | Discharge: 2022-06-04 | Disposition: A | Discharge status: Home / Self Care | Payer: Medicare Other | Source: Ambulatory Visit | Attending: Internal Medicine | Admitting: Internal Medicine

## 2022-06-04 DIAGNOSIS — I6930 Unspecified sequelae of cerebral infarction: Secondary | ICD-10-CM | POA: Insufficient documentation

## 2022-06-04 MED ORDER — IOHEXOL 350 MG/ML SOLN
75.0000 mL | Freq: Once | INTRAVENOUS | Status: AC | PRN
  Administered 2022-06-04: 75 mL via INTRAVENOUS

## 2022-06-04 NOTE — Telephone Encounter (Signed)
Follow up call to pt, lm for pt to call if having any difficulty with normal activities or eating and drinking.  Also to call if any other questions or concerns.  

## 2022-06-05 ENCOUNTER — Encounter: Payer: Self-pay | Admitting: Internal Medicine

## 2022-06-05 ENCOUNTER — Ambulatory Visit (INDEPENDENT_AMBULATORY_CARE_PROVIDER_SITE_OTHER): Payer: Medicare Other | Admitting: Internal Medicine

## 2022-06-05 ENCOUNTER — Other Ambulatory Visit: Payer: Self-pay

## 2022-06-05 DIAGNOSIS — I6930 Unspecified sequelae of cerebral infarction: Secondary | ICD-10-CM | POA: Diagnosis not present

## 2022-06-05 DIAGNOSIS — B20 Human immunodeficiency virus [HIV] disease: Secondary | ICD-10-CM

## 2022-06-05 LAB — POCT I-STAT CREATININE: Creatinine, Ser: <0.2 mg/dL — ABNORMAL LOW (ref 0.44–1.00)

## 2022-06-05 MED ORDER — GENVOYA 150-150-200-10 MG PO TABS: 1.0000 | ORAL_TABLET | Freq: Every day | ORAL | 11 refills | Status: DC

## 2022-06-05 NOTE — Assessment & Plan Note (Signed)
Her infection has been under excellent, long-term control.  She will get repeat lab work today, continue [place] and follow-up in 1 year.
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
## 2022-06-06 DIAGNOSIS — M48061 Spinal stenosis, lumbar region without neurogenic claudication: Secondary | ICD-10-CM | POA: Diagnosis not present

## 2022-06-06 DIAGNOSIS — M24542 Contracture, left hand: Secondary | ICD-10-CM | POA: Diagnosis not present

## 2022-06-06 DIAGNOSIS — I69354 Hemiplegia and hemiparesis following cerebral infarction affecting left non-dominant side: Secondary | ICD-10-CM | POA: Diagnosis not present

## 2022-06-06 DIAGNOSIS — I10 Essential (primary) hypertension: Secondary | ICD-10-CM | POA: Diagnosis not present

## 2022-06-06 DIAGNOSIS — E46 Unspecified protein-calorie malnutrition: Secondary | ICD-10-CM | POA: Diagnosis not present

## 2022-06-06 DIAGNOSIS — E1142 Type 2 diabetes mellitus with diabetic polyneuropathy: Secondary | ICD-10-CM | POA: Diagnosis not present

## 2022-06-06 LAB — T-HELPER CELLS (CD4) COUNT (NOT AT ARMC)
CD4 % Helper T Cell: 44 % (ref 33–65)
CD4 T Cell Abs: 1067 /uL (ref 400–1790)

## 2022-06-07 ENCOUNTER — Telehealth: Payer: Self-pay

## 2022-06-07 DIAGNOSIS — M21379 Foot drop, unspecified foot: Secondary | ICD-10-CM

## 2022-06-07 DIAGNOSIS — M79604 Pain in right leg: Secondary | ICD-10-CM

## 2022-06-07 NOTE — Telephone Encounter (Signed)
Returned call to [place], PT with [facility]. Requesting VO to continue HH PT 1 week 5 to work on neuromuscular reeducation, and walking with hemi walker. Verbal auth given. Will route to PCP for agreement/denial.  Also, requesting an order to reevaluate AFOs. States left leg has a plastic brace that does not allow for changes in feet 2/2 edema. She is concerned for potential skin breakdown. She would like order sent to [employer].

## 2022-06-07 NOTE — Telephone Encounter (Signed)
Agree with plan. So she is asking for a referral for a new prosthetic?

## 2022-06-07 NOTE — Telephone Encounter (Signed)
Nicole [name] with [facility] requesting VO on PT. Please call back.

## 2022-06-08 LAB — CBC
HCT: 41.9 % (ref 35.0–45.0)
Hemoglobin: 14.4 g/dL (ref 11.7–15.5)
MCH: 31.2 pg (ref 27.0–33.0)
MCHC: 34.4 g/dL (ref 32.0–36.0)
MCV: 90.7 fL (ref 80.0–100.0)
MPV: 11.3 fL (ref 7.5–12.5)
Platelets: 165 Thousand/uL (ref 140–400)
RBC: 4.62 Million/uL (ref 3.80–5.10)
RDW: 13 % (ref 11.0–15.0)
WBC: 7.4 Thousand/uL (ref 3.8–10.8)

## 2022-06-08 LAB — HIV-1 RNA QUANT-NO REFLEX-BLD
HIV 1 RNA Quant: NOT DETECTED Copies/mL
HIV-1 RNA Quant, Log: NOT DETECTED Log cps/mL

## 2022-06-08 LAB — COMPREHENSIVE METABOLIC PANEL
AG Ratio: 1.1 (calc) (ref 1.0–2.5)
ALT: 14 U/L (ref 6–29)
AST: 14 U/L (ref 10–35)
Albumin: 3.9 g/dL (ref 3.6–5.1)
Alkaline phosphatase (APISO): 69 U/L (ref 37–153)
BUN: 14 mg/dL (ref 7–25)
CO2: 26 mmol/L (ref 20–32)
Calcium: 9.1 mg/dL (ref 8.6–10.4)
Chloride: 107 mmol/L (ref 98–110)
Creat: 0.68 mg/dL (ref 0.50–1.05)
Globulin: 3.4 g/dL (calc) (ref 1.9–3.7)
Glucose, Bld: 97 mg/dL (ref 65–99)
Potassium: 3.6 mmol/L (ref 3.5–5.3)
Sodium: 142 mmol/L (ref 135–146)
Total Bilirubin: 0.3 mg/dL (ref 0.2–1.2)
Total Protein: 7.3 g/dL (ref 6.1–8.1)

## 2022-06-08 LAB — RPR: RPR Ser Ql: NONREACTIVE

## 2022-06-08 NOTE — Telephone Encounter (Signed)
Order for AFOs along with demographics with insurance information, and documentation of this phone encounter, faxed to [employer] at [phone]. Fax confirmation receipt received.

## 2022-06-08 NOTE — Telephone Encounter (Signed)
Order placed, thanks.

## 2022-06-08 NOTE — Telephone Encounter (Signed)
Yes, it's a DME Other order. In Comments, write evaluate for new AFOs. I will then fax this to [employer]. Thanks!

## 2022-06-08 NOTE — Addendum Note (Signed)
Addended by: Belva Agee on: 06/08/2022 08:47 AM   Modules accepted: Orders

## 2022-06-11 DIAGNOSIS — Z87820 Personal history of traumatic brain injury: Secondary | ICD-10-CM | POA: Diagnosis not present

## 2022-06-11 DIAGNOSIS — F32A Depression, unspecified: Secondary | ICD-10-CM | POA: Diagnosis not present

## 2022-06-11 DIAGNOSIS — E1142 Type 2 diabetes mellitus with diabetic polyneuropathy: Secondary | ICD-10-CM | POA: Diagnosis not present

## 2022-06-11 DIAGNOSIS — I10 Essential (primary) hypertension: Secondary | ICD-10-CM | POA: Diagnosis not present

## 2022-06-11 DIAGNOSIS — I69354 Hemiplegia and hemiparesis following cerebral infarction affecting left non-dominant side: Secondary | ICD-10-CM | POA: Diagnosis not present

## 2022-06-11 DIAGNOSIS — M48061 Spinal stenosis, lumbar region without neurogenic claudication: Secondary | ICD-10-CM | POA: Diagnosis not present

## 2022-06-11 DIAGNOSIS — Z7982 Long term (current) use of aspirin: Secondary | ICD-10-CM | POA: Diagnosis not present

## 2022-06-11 DIAGNOSIS — L409 Psoriasis, unspecified: Secondary | ICD-10-CM | POA: Diagnosis not present

## 2022-06-11 DIAGNOSIS — F17210 Nicotine dependence, cigarettes, uncomplicated: Secondary | ICD-10-CM | POA: Diagnosis not present

## 2022-06-11 DIAGNOSIS — J309 Allergic rhinitis, unspecified: Secondary | ICD-10-CM | POA: Diagnosis not present

## 2022-06-11 DIAGNOSIS — E876 Hypokalemia: Secondary | ICD-10-CM | POA: Diagnosis not present

## 2022-06-11 DIAGNOSIS — M1611 Unilateral primary osteoarthritis, right hip: Secondary | ICD-10-CM | POA: Diagnosis not present

## 2022-06-11 DIAGNOSIS — Z6831 Body mass index (BMI) 31.0-31.9, adult: Secondary | ICD-10-CM | POA: Diagnosis not present

## 2022-06-11 DIAGNOSIS — I4891 Unspecified atrial fibrillation: Secondary | ICD-10-CM | POA: Diagnosis not present

## 2022-06-11 DIAGNOSIS — Z7984 Long term (current) use of oral hypoglycemic drugs: Secondary | ICD-10-CM | POA: Diagnosis not present

## 2022-06-11 DIAGNOSIS — B182 Chronic viral hepatitis C: Secondary | ICD-10-CM | POA: Diagnosis not present

## 2022-06-11 DIAGNOSIS — R296 Repeated falls: Secondary | ICD-10-CM | POA: Diagnosis not present

## 2022-06-11 DIAGNOSIS — E46 Unspecified protein-calorie malnutrition: Secondary | ICD-10-CM | POA: Diagnosis not present

## 2022-06-11 DIAGNOSIS — Z7985 Long-term (current) use of injectable non-insulin antidiabetic drugs: Secondary | ICD-10-CM | POA: Diagnosis not present

## 2022-06-11 DIAGNOSIS — M24542 Contracture, left hand: Secondary | ICD-10-CM | POA: Diagnosis not present

## 2022-06-11 DIAGNOSIS — E785 Hyperlipidemia, unspecified: Secondary | ICD-10-CM | POA: Diagnosis not present

## 2022-06-11 DIAGNOSIS — K219 Gastro-esophageal reflux disease without esophagitis: Secondary | ICD-10-CM | POA: Diagnosis not present

## 2022-06-11 DIAGNOSIS — Z21 Asymptomatic human immunodeficiency virus [HIV] infection status: Secondary | ICD-10-CM | POA: Diagnosis not present

## 2022-06-11 DIAGNOSIS — Z9181 History of falling: Secondary | ICD-10-CM | POA: Diagnosis not present

## 2022-06-20 ENCOUNTER — Ambulatory Visit: Payer: Self-pay

## 2022-06-20 NOTE — Patient Outreach (Signed)
  Care Coordination   06/20/2022 Name: Nicole [name] MRN: [id number] DOB: 1961/09/04   Care Coordination Outreach Attempts:  An unsuccessful telephone outreach was attempted today to offer the patient information about available care coordination services as a benefit of their health plan.   Follow Up Plan:  Additional outreach attempts will be made to offer the patient care coordination information and services.   Encounter Outcome:  No Answer  Care Coordination Interventions Activated:  No   Care Coordination Interventions:  No, not indicated    [facility] Care Management [phone]

## 2022-06-21 DIAGNOSIS — I10 Essential (primary) hypertension: Secondary | ICD-10-CM | POA: Diagnosis not present

## 2022-06-21 DIAGNOSIS — M24542 Contracture, left hand: Secondary | ICD-10-CM | POA: Diagnosis not present

## 2022-06-21 DIAGNOSIS — I69354 Hemiplegia and hemiparesis following cerebral infarction affecting left non-dominant side: Secondary | ICD-10-CM | POA: Diagnosis not present

## 2022-06-21 DIAGNOSIS — M48061 Spinal stenosis, lumbar region without neurogenic claudication: Secondary | ICD-10-CM | POA: Diagnosis not present

## 2022-06-21 DIAGNOSIS — E1142 Type 2 diabetes mellitus with diabetic polyneuropathy: Secondary | ICD-10-CM | POA: Diagnosis not present

## 2022-06-21 DIAGNOSIS — E46 Unspecified protein-calorie malnutrition: Secondary | ICD-10-CM | POA: Diagnosis not present

## 2022-06-27 DIAGNOSIS — I69354 Hemiplegia and hemiparesis following cerebral infarction affecting left non-dominant side: Secondary | ICD-10-CM | POA: Diagnosis not present

## 2022-06-27 DIAGNOSIS — M24542 Contracture, left hand: Secondary | ICD-10-CM | POA: Diagnosis not present

## 2022-06-27 DIAGNOSIS — I10 Essential (primary) hypertension: Secondary | ICD-10-CM | POA: Diagnosis not present

## 2022-06-27 DIAGNOSIS — E46 Unspecified protein-calorie malnutrition: Secondary | ICD-10-CM | POA: Diagnosis not present

## 2022-06-27 DIAGNOSIS — E1142 Type 2 diabetes mellitus with diabetic polyneuropathy: Secondary | ICD-10-CM | POA: Diagnosis not present

## 2022-06-27 DIAGNOSIS — M48061 Spinal stenosis, lumbar region without neurogenic claudication: Secondary | ICD-10-CM | POA: Diagnosis not present

## 2022-07-02 ENCOUNTER — Ambulatory Visit: Payer: Self-pay

## 2022-07-02 NOTE — Patient Outreach (Addendum)
Care Coordination   Initial Visit Note   07/02/2022 Name: [name] MRN: [id number] DOB: January 11, 1961  Nicole [name] is a 61 y.o. year old female who sees Belva Agee, MD for primary care. I spoke with  [name] by phone today  What matters to the patients health and wellness today?  Health Maintenance    SDOH assessments and interventions completed:  Yes  SDOH Interventions Today    Flowsheet Row Most Recent Value  SDOH Interventions   Food Insecurity Interventions Intervention Not Indicated  Transportation Interventions Intervention Not Indicated        Care Coordination Interventions Activated:  No  Care Coordination Interventions:  No, not indicated    Encounter Outcome:  Pt. Visit Completed   [facility] Care Management [phone]

## 2022-07-02 NOTE — Patient Instructions (Signed)
Visit Information  Thank you for taking time to visit with me today. Please don't hesitate to contact me if I can be of assistance to you.   Following are the goals we discussed today:   Goals Addressed             This Visit's Progress    Health Maintenance       Care Coordination Interventions:          The patient verbalized understanding of instructions, educational materials, and care plan provided today and DECLINED offer to receive copy of patient instructions, educational materials, and care plan.

## 2022-07-04 DIAGNOSIS — M48061 Spinal stenosis, lumbar region without neurogenic claudication: Secondary | ICD-10-CM | POA: Diagnosis not present

## 2022-07-04 DIAGNOSIS — E1142 Type 2 diabetes mellitus with diabetic polyneuropathy: Secondary | ICD-10-CM | POA: Diagnosis not present

## 2022-07-04 DIAGNOSIS — I69354 Hemiplegia and hemiparesis following cerebral infarction affecting left non-dominant side: Secondary | ICD-10-CM | POA: Diagnosis not present

## 2022-07-04 DIAGNOSIS — E46 Unspecified protein-calorie malnutrition: Secondary | ICD-10-CM | POA: Diagnosis not present

## 2022-07-04 DIAGNOSIS — I10 Essential (primary) hypertension: Secondary | ICD-10-CM | POA: Diagnosis not present

## 2022-07-04 DIAGNOSIS — M24542 Contracture, left hand: Secondary | ICD-10-CM | POA: Diagnosis not present

## 2022-07-11 ENCOUNTER — Ambulatory Visit: Payer: Medicare Other | Admitting: Podiatry

## 2022-07-11 DIAGNOSIS — E785 Hyperlipidemia, unspecified: Secondary | ICD-10-CM | POA: Diagnosis not present

## 2022-07-11 DIAGNOSIS — Z6831 Body mass index (BMI) 31.0-31.9, adult: Secondary | ICD-10-CM | POA: Diagnosis not present

## 2022-07-11 DIAGNOSIS — E876 Hypokalemia: Secondary | ICD-10-CM | POA: Diagnosis not present

## 2022-07-11 DIAGNOSIS — R296 Repeated falls: Secondary | ICD-10-CM | POA: Diagnosis not present

## 2022-07-11 DIAGNOSIS — Z7985 Long-term (current) use of injectable non-insulin antidiabetic drugs: Secondary | ICD-10-CM | POA: Diagnosis not present

## 2022-07-11 DIAGNOSIS — Z21 Asymptomatic human immunodeficiency virus [HIV] infection status: Secondary | ICD-10-CM | POA: Diagnosis not present

## 2022-07-11 DIAGNOSIS — E46 Unspecified protein-calorie malnutrition: Secondary | ICD-10-CM | POA: Diagnosis not present

## 2022-07-11 DIAGNOSIS — F17210 Nicotine dependence, cigarettes, uncomplicated: Secondary | ICD-10-CM | POA: Diagnosis not present

## 2022-07-11 DIAGNOSIS — I69354 Hemiplegia and hemiparesis following cerebral infarction affecting left non-dominant side: Secondary | ICD-10-CM | POA: Diagnosis not present

## 2022-07-11 DIAGNOSIS — J309 Allergic rhinitis, unspecified: Secondary | ICD-10-CM | POA: Diagnosis not present

## 2022-07-11 DIAGNOSIS — M48061 Spinal stenosis, lumbar region without neurogenic claudication: Secondary | ICD-10-CM | POA: Diagnosis not present

## 2022-07-11 DIAGNOSIS — L409 Psoriasis, unspecified: Secondary | ICD-10-CM | POA: Diagnosis not present

## 2022-07-11 DIAGNOSIS — I4891 Unspecified atrial fibrillation: Secondary | ICD-10-CM | POA: Diagnosis not present

## 2022-07-11 DIAGNOSIS — M1611 Unilateral primary osteoarthritis, right hip: Secondary | ICD-10-CM | POA: Diagnosis not present

## 2022-07-11 DIAGNOSIS — Z7984 Long term (current) use of oral hypoglycemic drugs: Secondary | ICD-10-CM | POA: Diagnosis not present

## 2022-07-11 DIAGNOSIS — K219 Gastro-esophageal reflux disease without esophagitis: Secondary | ICD-10-CM | POA: Diagnosis not present

## 2022-07-11 DIAGNOSIS — M24542 Contracture, left hand: Secondary | ICD-10-CM | POA: Diagnosis not present

## 2022-07-11 DIAGNOSIS — I10 Essential (primary) hypertension: Secondary | ICD-10-CM | POA: Diagnosis not present

## 2022-07-11 DIAGNOSIS — Z7982 Long term (current) use of aspirin: Secondary | ICD-10-CM | POA: Diagnosis not present

## 2022-07-11 DIAGNOSIS — Z9181 History of falling: Secondary | ICD-10-CM | POA: Diagnosis not present

## 2022-07-11 DIAGNOSIS — Z87820 Personal history of traumatic brain injury: Secondary | ICD-10-CM | POA: Diagnosis not present

## 2022-07-11 DIAGNOSIS — F32A Depression, unspecified: Secondary | ICD-10-CM | POA: Diagnosis not present

## 2022-07-11 DIAGNOSIS — E1142 Type 2 diabetes mellitus with diabetic polyneuropathy: Secondary | ICD-10-CM | POA: Diagnosis not present

## 2022-07-11 DIAGNOSIS — B182 Chronic viral hepatitis C: Secondary | ICD-10-CM | POA: Diagnosis not present

## 2022-07-12 ENCOUNTER — Telehealth: Payer: Self-pay | Admitting: Student

## 2022-07-12 DIAGNOSIS — M21379 Foot drop, unspecified foot: Secondary | ICD-10-CM

## 2022-07-12 DIAGNOSIS — I69398 Other sequelae of cerebral infarction: Secondary | ICD-10-CM

## 2022-07-12 NOTE — Telephone Encounter (Signed)
Rec'd a call from Kallie Edward Engineer, structural) [phone] with [facility] Requesting a referral for the pt  to start Outpatient Rehab Therapy now the that [facility] has released her from their services.   The patient is requesting to have Outpatient PT started as soon as possible  They are requesting a referral be faxed to [phone].  Please advise.

## 2022-08-03 ENCOUNTER — Encounter: Payer: Self-pay | Admitting: Podiatry

## 2022-08-03 ENCOUNTER — Other Ambulatory Visit: Payer: Self-pay | Admitting: Gastroenterology

## 2022-08-03 ENCOUNTER — Ambulatory Visit (INDEPENDENT_AMBULATORY_CARE_PROVIDER_SITE_OTHER): Payer: Medicare Other | Admitting: Podiatry

## 2022-08-03 DIAGNOSIS — M79674 Pain in right toe(s): Secondary | ICD-10-CM

## 2022-08-03 DIAGNOSIS — B351 Tinea unguium: Secondary | ICD-10-CM

## 2022-08-03 DIAGNOSIS — E1140 Type 2 diabetes mellitus with diabetic neuropathy, unspecified: Secondary | ICD-10-CM | POA: Diagnosis not present

## 2022-08-03 DIAGNOSIS — L84 Corns and callosities: Secondary | ICD-10-CM

## 2022-08-03 DIAGNOSIS — M79675 Pain in left toe(s): Secondary | ICD-10-CM | POA: Diagnosis not present

## 2022-08-03 DIAGNOSIS — E1149 Type 2 diabetes mellitus with other diabetic neurological complication: Secondary | ICD-10-CM

## 2022-08-03 DIAGNOSIS — B20 Human immunodeficiency virus [HIV] disease: Secondary | ICD-10-CM

## 2022-08-03 DIAGNOSIS — Z1211 Encounter for screening for malignant neoplasm of colon: Secondary | ICD-10-CM

## 2022-08-10 NOTE — Progress Notes (Signed)
  Subjective:  Patient ID: Nicole [name], female    DOB: 11/07/1961,  MRN: [id number]  Nicole [name] presents to clinic today for at risk foot care. Patient has history of NIDDM, HIV and callus(es) b/l lower extremities and painful thick toenails that are difficult to trim. Painful toenails interfere with ambulation. Aggravating factors include wearing enclosed shoe gear. Pain is relieved with periodic professional debridement. Painful calluses are aggravated when weightbearing with and without shoegear. Pain is relieved with periodic professional debridement.  Patient also has h/o CVA. She has shoe with attached brace on LLE and and AFO on RLE. She is wheelchair bound.  Last known  HgA1c was 6.4%.  Patient does not monitor blood glucose daily.  New problem(s): None.   PCP is Katsadouros, Vasilios, MD , and last visit was  May 21, 2022.  Allergies  Allergen Reactions   Cephalexin Rash    Review of Systems: Negative except as noted in the HPI.  Objective: No changes noted in today's physical examination.  Nicole [name] is a pleasant 61 y.o. female obese in NAD. AAO x 3.  Vascular Examination: Capillary refill time immediate b/l.Vascular status intact b/l with faintly palpable pedal pulses. Pedal hair absent b/l. No pain with calf compression b/l. Skin temperature gradient WNL b/l. Trace dependent edema noted BLE.  Neurological Examination: Protective sensation diminished with 10g monofilament b/l.  Dermatological Examination: Pedal skin with normal turgor, texture and tone b/l. Toenails 1-5 b/l thick, discolored, elongated with subungual debris and pain on dorsal palpation.  Well healed surgical scar noted dorsal 2nd web space left foot. No open wounds b/l LE. No interdigital macerations noted b/l LE. Hyperkeratotic lesion(s) dorsal PIPJ of L 5th toe.  No erythema, no edema, no drainage, no fluctuance.  Musculoskeletal Examination: Noted disuse atrophy b/l lower extremities.  Dropfoot b/l lower extremities. Wearing AFO on right lower extremity. Wearing shoes with brace attached LLE. Utilizes wheelchair for mobility assistance.  Radiographs: None  Last A1c:      Latest Ref Rng & Units 05/21/2022    3:14 PM 02/22/2022   10:55 AM 10/10/2021   11:02 AM  Hemoglobin A1C  Hemoglobin-A1c 4.0 - 5.6 % 6.4  6.1  6.5    Assessment/Plan: 1. Pain due to onychomycosis of toenails of both feet   2. Corns   3. Diabetic neuropathy with neurologic complication (HCC)   4. Human immunodeficiency virus (HIV) disease (HCC)     No orders of the defined types were placed in this encounter.  -Patient's family member present. All questions/concerns addressed on today's visit. -Examined patient. -Toenails 1-5 b/l were debrided in length and girth with sterile nail nippers and dremel without iatrogenic bleeding.  -Corn(s) L 5th toe pared utilizing sterile scalpel blade without complication or incident. Total number debrided=1. -Patient/POA to call should there be question/concern in the interim.   Return in about 3 months (around 11/02/2022).  Nicole [name], DPM  

## 2022-08-30 ENCOUNTER — Telehealth: Payer: Self-pay

## 2022-08-30 NOTE — Telephone Encounter (Signed)
Order for AFOs along with demographics with insurance information, and documentation of 06/07/22 phone encounter, faxed to [facility] at [phone] on 06/08/22. Fax confirmation receipt received.  Call placed to [facility] at [facility] [phone]. States they reached out to patient on 8/2 and she did not call them back till 10/18. Order and note are no longer valid. Will need Addendum to 7/10 OV note stating why patient needs new AFOs, and new order placed.  Call placed to patient. States she was in [facility]hab that entire time. She will contact [employer] as soon as she learns new order has been placed.

## 2022-08-30 NOTE — Telephone Encounter (Signed)
Requesting Rx for leg brace to be sent to [facility]. Please call pt back.

## 2022-08-31 NOTE — Telephone Encounter (Signed)
Note updated and order placed

## 2022-08-31 NOTE — Addendum Note (Signed)
Addended by: Riesa Pope on: 08/31/2022 10:00 AM   Modules accepted: Orders

## 2022-08-31 NOTE — Telephone Encounter (Signed)
New order, demographics, and addended OV note from 7/10 faxed to [facility] at [phone]. Fax confirmation receipt received.   Left message on patient's VM that this has been done.

## 2022-10-25 ENCOUNTER — Ambulatory Visit: Payer: Self-pay | Admitting: Licensed Clinical Social Worker

## 2022-10-25 NOTE — Patient Outreach (Signed)
SW removed self from Care Team.  Yusuf Yu, BSW, MSW, LCSW-A  Social Worker [facility] Care Management  [phone] 

## 2022-10-26 NOTE — Progress Notes (Signed)
error 

## 2022-11-20 ENCOUNTER — Ambulatory Visit: Payer: Medicare Other | Admitting: Podiatry

## 2022-12-02 ENCOUNTER — Other Ambulatory Visit: Payer: Self-pay | Admitting: Student

## 2022-12-02 DIAGNOSIS — F17200 Nicotine dependence, unspecified, uncomplicated: Secondary | ICD-10-CM

## 2022-12-04 ENCOUNTER — Encounter: Payer: Self-pay | Admitting: Student

## 2022-12-04 ENCOUNTER — Ambulatory Visit: Payer: Medicare HMO | Admitting: Student

## 2022-12-04 ENCOUNTER — Encounter: Payer: Medicare Other | Admitting: Student

## 2022-12-04 VITALS — BP 123/84 | HR 70 | Temp 97.8°F | Ht 60.0 in | Wt 237.6 lb

## 2022-12-04 DIAGNOSIS — B20 Human immunodeficiency virus [HIV] disease: Secondary | ICD-10-CM

## 2022-12-04 DIAGNOSIS — M21379 Foot drop, unspecified foot: Secondary | ICD-10-CM | POA: Diagnosis not present

## 2022-12-04 DIAGNOSIS — Z1231 Encounter for screening mammogram for malignant neoplasm of breast: Secondary | ICD-10-CM

## 2022-12-04 DIAGNOSIS — F17210 Nicotine dependence, cigarettes, uncomplicated: Secondary | ICD-10-CM

## 2022-12-04 DIAGNOSIS — E119 Type 2 diabetes mellitus without complications: Secondary | ICD-10-CM

## 2022-12-04 DIAGNOSIS — Z7984 Long term (current) use of oral hypoglycemic drugs: Secondary | ICD-10-CM

## 2022-12-04 DIAGNOSIS — F17200 Nicotine dependence, unspecified, uncomplicated: Secondary | ICD-10-CM

## 2022-12-04 DIAGNOSIS — I10 Essential (primary) hypertension: Secondary | ICD-10-CM

## 2022-12-04 DIAGNOSIS — Z8673 Personal history of transient ischemic attack (TIA), and cerebral infarction without residual deficits: Secondary | ICD-10-CM

## 2022-12-04 DIAGNOSIS — Z0000 Encounter for general adult medical examination without abnormal findings: Secondary | ICD-10-CM

## 2022-12-04 LAB — POCT GLYCOSYLATED HEMOGLOBIN (HGB A1C): Hemoglobin A1C: 6.8 % — AB (ref 4.0–5.6)

## 2022-12-04 LAB — GLUCOSE, CAPILLARY: Glucose-Capillary: 110 mg/dL — ABNORMAL HIGH (ref 70–99)

## 2022-12-04 MED ORDER — OLMESARTAN-AMLODIPINE-HCTZ 40-10-12.5 MG PO TABS: 1.0000 | ORAL_TABLET | Freq: Every day | ORAL | 1 refills | Status: DC

## 2022-12-04 MED ORDER — OLMESARTAN-AMLODIPINE-HCTZ 40-5-12.5 MG PO TABS: 1.0000 | ORAL_TABLET | Freq: Every day | ORAL | 2 refills | Status: DC

## 2022-12-04 MED ORDER — BLOOD PRESSURE CUFF MISC: 1.0000 | Freq: Two times a day (BID) | 0 refills | Status: AC

## 2022-12-04 NOTE — Patient Instructions (Addendum)
Thank you, Ms.Julien Girt for allowing [place] to provide your care today. Today we discussed.  High blood pressure We are putting you on a new medication that adds amlodipine. Please stop taking your losartan-hydrochlorothiazide, and start taking losartan-hydrochlorothiazide-amlodipine. I have ordered a blood pressure cuff, if you are unable to get it or they charge you , I recommend calling [employer].   Diabetes Please continue to take the farxiga daily. We will check your diabetes at your next visit. Please keep following up with your foot doctor  HIV Our office will call to schedule you an appointment with Dr. Johnnye Sima to establish with [place] now that Dr. Megan Salon is retiring.     [facility] have [facility] with [facility] new brace for your right leg.   I have placed referrals for your mammogram and eye exam.   I have ordered the following labs for you:  Lab Orders         Glucose, capillary         Microalbumin / Creatinine Urine Ratio         POC Hbg A1C      Referrals ordered today:   Referral Orders         Ambulatory referral to Ophthalmology         AMB Referral to [facility]         Ambulatory Referral for DME         Ambulatory referral to Infectious Disease      I have ordered the following medication/changed the following medications:   Stop the following medications: Medications Discontinued During This Encounter  Medication Reason   losartan-hydrochlorothiazide (HYZAAR) 50-12.5 MG tablet    OZEMPIC, 0.25 OR 0.5 MG/DOSE, 2 MG/3ML SOPN    Semaglutide,0.25 or 0.5MG /DOS, (OZEMPIC, 0.25 OR 0.5 MG/DOSE,) 2 MG/1.5ML SOPN    Blood Pressure Monitoring (ADULT BLOOD PRESSURE CUFF LG) KIT    Olmesartan-amLODIPine-HCTZ 40-10-12.5 MG TABS      Start the following medications: Meds ordered this encounter  Medications   DISCONTD: Olmesartan-amLODIPine-HCTZ 40-10-12.5 MG TABS    Sig: Take 1 tablet by mouth daily.    Dispense:  90 tablet     Refill:  1   Blood Pressure Monitoring (BLOOD PRESSURE CUFF) MISC    Sig: 1 each by Does not apply route 2 (two) times daily.    Dispense:  1 each    Refill:  0   Olmesartan-amLODIPine-HCTZ 40-5-12.5 MG TABS    Sig: Take 1 tablet by mouth daily.    Dispense:  30 tablet    Refill:  2     Follow up: 3 months    Should you have any questions or concerns please call the internal medicine clinic at [phone].    Sanjuana Letters, D.O. [place]

## 2022-12-05 ENCOUNTER — Other Ambulatory Visit: Payer: Self-pay | Admitting: Student

## 2022-12-05 ENCOUNTER — Telehealth: Payer: Self-pay | Admitting: *Deleted

## 2022-12-05 LAB — MICROALBUMIN / CREATININE URINE RATIO
Creatinine, Urine: 34.6 mg/dL
Microalb/Creat Ratio: 57 mg/g creat — ABNORMAL HIGH (ref 0–29)
Microalbumin, Urine: 19.7 ug/mL

## 2022-12-05 NOTE — Progress Notes (Unsigned)
  Care Coordination  Outreach Note  12/05/2022 Name: Nicole [name] MRN: [id number] DOB: 16-Sep-1961   Care Coordination Outreach Attempts: An unsuccessful telephone outreach was attempted today to offer the patient information about available care coordination services as a benefit of their health plan.   Follow Up Plan:  Additional outreach attempts will be made to offer the patient care coordination information and services.   Encounter Outcome:  No Answer  [place] [facility]  Direct Dial: [phone]

## 2022-12-05 NOTE — Assessment & Plan Note (Addendum)
Assessment: Current regimen losartan-hctz 50-12.5 mg daily. BP not at goal during prior visit and today lowest reading of 132/84. Will add amlodipine 5 to regimen and change to olmesartan-hctz-amlodipine 40-5-12.5 mg daily.  Plan: - start olmesartan-hctz-amlodipine 40-5-12.5 mg daily. - recommended obtaining a BP cuff and checking pressure at home

## 2022-12-05 NOTE — Assessment & Plan Note (Addendum)
Assessment: A1c today of 6.8% from 6.4%. Regimen of farxiga 10 mg daily, she thought she was no longer on ozempic and has not taken it since our appointment 05/2022. Will continue with farxiga alone and add back ozempic if A1c above goal of 7.0%  Plan: - continue farxiga 10 mg daily - foot exam with left foot lateral wound followed by podiatry. Does not appear actively infected. Instructed to call if any changes and follow up with podiatry - goal A1c of under 7% - microalbumin/cr ratio elevated, continue arb and SGLT-2i - eye exam referral placed

## 2022-12-05 NOTE — Assessment & Plan Note (Signed)
Assessment: Follows with Dr. Megan Salon and is adherent to her genvoya. Dr. Megan Salon is about to retire and she would like to continue all of her care with [facility]. Will place referral to setup initial appointment with Dr. Johnnye Sima.   Plan: - continue genvoya 150-150-200-10 daily and follow up with Dr. Johnnye Sima in [facility]

## 2022-12-05 NOTE — Assessment & Plan Note (Addendum)
Assessment: Completed outpatient PT and does not believe further therapy would be helpful. She has continued her atorvastatin, aspirin daily. Has had some residual right foot drop and temporary brace placed, [facility] of permanent brace  Plan: - continue aspirin and atorvastatin - continue to control HTN and T2DM

## 2022-12-05 NOTE — Assessment & Plan Note (Signed)
Referral placed for mammogram
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
## 2022-12-06 ENCOUNTER — Ambulatory Visit: Payer: Medicare Other | Admitting: Podiatry

## 2022-12-06 NOTE — Progress Notes (Signed)
  Care Coordination   Note   12/06/2022 Name: JULEE STOLL MRN: [id number] DOB: August 09, 1961  MAURA BRAATEN is a 62 y.o. year old female who sees Riesa Pope, MD for primary care. I reached out to Julien Girt by phone today to offer care coordination services.  Ms. Schmuck was given information about Care Coordination services today including:   The Care Coordination services include support from the care team which includes your Nurse Coordinator, Clinical Social Worker, or Pharmacist.  The Care Coordination team is here to help remove barriers to the health concerns and goals most important to you. Care Coordination services are voluntary, and the patient may decline or stop services at any time by request to their care team member.   Care Coordination Consent Status: Patient agreed to services and verbal consent obtained.   Follow up plan:  Telephone appointment with care coordination team member scheduled for:  12/13/22  Encounter Outcome:  Pt. Scheduled  [place]  Direct Dial: [phone]

## 2022-12-12 ENCOUNTER — Encounter: Payer: Self-pay | Admitting: Podiatry

## 2022-12-12 ENCOUNTER — Ambulatory Visit (INDEPENDENT_AMBULATORY_CARE_PROVIDER_SITE_OTHER): Payer: Medicare HMO | Admitting: Podiatry

## 2022-12-12 DIAGNOSIS — M79675 Pain in left toe(s): Secondary | ICD-10-CM | POA: Diagnosis not present

## 2022-12-12 DIAGNOSIS — M79674 Pain in right toe(s): Secondary | ICD-10-CM | POA: Diagnosis not present

## 2022-12-12 DIAGNOSIS — B351 Tinea unguium: Secondary | ICD-10-CM

## 2022-12-12 NOTE — Progress Notes (Signed)
  Subjective:  Patient ID: Nicole [name], female    DOB: [birth year],  MRN: [id number]  Nicole [name] presents to clinic today for at risk foot care. Patient has history of NIDDM, HIV and callus(es) b/l lower extremities and painful thick toenails that are difficult to trim. Painful toenails interfere with ambulation. Aggravating factors include wearing enclosed shoe gear. Pain is relieved with periodic professional debridement. Painful calluses are aggravated when weightbearing with and without shoegear. Pain is relieved with periodic professional debridement.  Patient also has h/o CVA. She has shoe with attached brace on LLE and and AFO on RLE. She is wheelchair bound.  Last known  HgA1c was 6.4%.  Patient does not monitor blood glucose daily.  New problem(s): None.   PCP is Riesa Pope, MD , and last visit was  May 21, 2022.  Allergies  Allergen Reactions   Cephalexin Rash    Review of Systems: Negative except as noted in the HPI.  Objective: No changes noted in today's physical examination.  Nicole [name] is a pleasant 62 y.o. female obese in NAD. AAO x 3.  Vascular Examination: Capillary refill time immediate b/l.Vascular status intact b/l with faintly palpable pedal pulses. Pedal hair absent b/l. No pain with calf compression b/l. Skin temperature gradient WNL b/l. Trace dependent edema noted BLE.  Neurological Examination: Protective sensation diminished with 10g monofilament b/l.  Dermatological Examination: Pedal skin with normal turgor, texture and tone b/l. Toenails 1-5 b/l thick, discolored, elongated with subungual debris and pain on dorsal palpation.  Well healed surgical scar noted dorsal 2nd web space left foot. No open wounds b/l LE. No interdigital macerations noted b/l LE. Hyperkeratotic lesion(s) dorsal PIPJ of L 5th toe.  No erythema, no edema, no drainage, no fluctuance.  Musculoskeletal Examination: Noted disuse atrophy b/l lower extremities.  Dropfoot b/l lower extremities. Wearing AFO on right lower extremity. Wearing shoes with brace attached LLE. Utilizes wheelchair for mobility assistance.  Radiographs: None  Last A1c:      Latest Ref Rng & Units 05/21/2022    3:14 PM 02/22/2022   10:55 AM 10/10/2021   11:02 AM  Hemoglobin A1C  Hemoglobin-A1c 4.0 - 5.6 % 6.4  6.1  6.5    Assessment/Plan: 1. Pain due to onychomycosis of toenails of both feet   2. Corns   3. Diabetic neuropathy with neurologic complication ([place])   4. Human immunodeficiency virus (HIV) disease ([place])     No orders of the defined types were placed in this encounter.  -Patient's family member present. All questions/concerns addressed on today's visit. -Examined patient. -Toenails 1-5 b/l were debrided in length and girth with sterile nail nippers and dremel without iatrogenic bleeding.  -Corn(s) L 5th toe pared utilizing sterile scalpel blade without complication or incident. Total number debrided=1. -Patient/POA to call should there be question/concern in the interim.   Return in about 3 months (around 11/02/2022).  Marzetta Board, DPM

## 2022-12-13 ENCOUNTER — Other Ambulatory Visit: Payer: Self-pay | Admitting: Student

## 2022-12-13 ENCOUNTER — Ambulatory Visit: Payer: Self-pay

## 2022-12-13 ENCOUNTER — Other Ambulatory Visit: Payer: Self-pay

## 2022-12-13 DIAGNOSIS — E118 Type 2 diabetes mellitus with unspecified complications: Secondary | ICD-10-CM

## 2022-12-13 DIAGNOSIS — F419 Anxiety disorder, unspecified: Secondary | ICD-10-CM

## 2022-12-13 DIAGNOSIS — E119 Type 2 diabetes mellitus without complications: Secondary | ICD-10-CM

## 2022-12-13 MED ORDER — ACCU-CHEK FASTCLIX LANCETS MISC: 5 refills | Status: AC

## 2022-12-13 MED ORDER — ACCU-CHEK GUIDE VI STRP: ORAL_STRIP | 4 refills | Status: AC

## 2022-12-13 NOTE — Patient Instructions (Signed)
Visit Information  Thank you for taking time to visit with me today. Please don't hesitate to contact me if I can be of assistance to you.   Following are the goals we discussed today:   Goals Addressed             This Visit's Progress    I want to gain knowledge to manage and maintain my health condition HTN and Type II DM       Patient Goals/Self Care Activities: -Patient/Caregiver will self-administer medications as prescribed as evidenced by self-report/primary caregiver report  -Patient/Caregiver will attend all scheduled provider appointments as evidenced by clinician review of documented attendance to scheduled appointments and patient/caregiver report -Patient/Caregiver will call pharmacy for medication refills as evidenced by patient report and review of pharmacy fill history as appropriate -Patient/Caregiver will call provider office for new concerns or questions as evidenced by review of documented incoming telephone call notes and patient report -Patient/Caregiver verbalizes understanding of plan  -Calls provider office for new concerns, questions, or BP outside discussed parameters -Checks BP and records as discussed -Follows a low sodium diet/DASH diet -check blood sugar at prescribed times -record values and write them down take them to all doctor visits    Assessed use of tobacco use        Our next appointment is by telephone on 12/27/22 at 115  Please call the care guide team at [phone] if you need to cancel or reschedule your appointment.   If you are experiencing a Mental Health or [place] or need someone to talk to, please call 1-800-273-TALK (toll free, 24 hour hotline)  The patient verbalized understanding of instructions, educational materials, and care plan provided today.    Lazaro Arms RN, BSN, [facility]   Phone: [phone]

## 2022-12-13 NOTE — Patient Outreach (Signed)
  Care Coordination   Initial Visit Note   12/13/2022 Name: Nicole [name] MRN: [id number] DOB: May 10, 1961  Nicole [name] is a 62 y.o. year old female who sees Riesa Pope, MD for primary care. I spoke with  Julien Girt by phone today.  What matters to the patients health and wellness today?   The patient wants to make  positive changes In her health  to improve her chronic conditions by picking up her glucometer, getting a blood pressure monitor and decreasing her smoking.See below for interventions and patient self-care actives.     Goals Addressed             This Visit's Progress    I want to gain knowledge to manage and maintain my health condition HTN and Type II DM       Patient Goals/Self Care Activities: -Patient/Caregiver will self-administer medications as prescribed as evidenced by self-report/primary caregiver report  -Patient/Caregiver will attend all scheduled provider appointments as evidenced by clinician review of documented attendance to scheduled appointments and patient/caregiver report -Patient/Caregiver will call pharmacy for medication refills as evidenced by patient report and review of pharmacy fill history as appropriate -Patient/Caregiver will call provider office for new concerns or questions as evidenced by review of documented incoming telephone call notes and patient report -Patient/Caregiver verbalizes understanding of plan  -Calls provider office for new concerns, questions, or BP outside discussed parameters -Checks BP and records as discussed -Follows a low sodium diet/DASH diet -check blood sugar at prescribed times -record values and write them down take them to all doctor visits    Assessed use of tobacco use        SDOH assessments and interventions completed:  Yes  SDOH Interventions Today    Flowsheet Row Most Recent Value  SDOH Interventions   Food Insecurity Interventions Intervention Not Indicated  Transportation  Interventions Intervention Not Indicated        Care Coordination Interventions:  Yes, provided    Interventions Today    Flowsheet Row Most Recent Value  Chronic Disease Discussed/Reviewed   Chronic disease discussed/reviewed during today's visit Hypertension (HTN), Diabetes, Other  [Stroke]  General Interventions   General Interventions Discussed/Reviewed General Interventions Discussed, General Interventions Reviewed  Education Interventions   Education Provided Provided Verbal Education  Nutrition Interventions   Nutrition Discussed/Reviewed Nutrition Discussed  Pharmacy Interventions   Pharmacy Dicussed/Reviewed Medication Adherence  [Called the pharmacy and spoke with Lanelle Bal to have glucometer prescription filled along with strips and lancets with the patient on the phone.  She will need to purchase her blood pressure monitor.]  Safety Interventions   Safety Discussed/Reviewed Safety Discussed       Follow up plan: Follow up call scheduled for 12/27/22  115 pm    Encounter Outcome:  Pt. Visit Completed   Lazaro Arms RN, BSN, [facility]   Phone: [phone]

## 2022-12-14 NOTE — Progress Notes (Signed)
Internal Medicine Clinic Attending  Case discussed with Dr. Katsadouros  At the time of the visit.  We reviewed the resident's history and exam and pertinent patient test results.  I agree with the assessment, diagnosis, and plan of care documented in the resident's note.  

## 2022-12-19 ENCOUNTER — Encounter: Payer: Medicare HMO | Admitting: Infectious Diseases

## 2022-12-26 ENCOUNTER — Encounter: Payer: Medicare Other | Admitting: Student

## 2022-12-27 ENCOUNTER — Telehealth: Payer: Self-pay

## 2022-12-27 NOTE — Patient Outreach (Signed)
  Care Coordination   12/27/2022 Name: Nicole [name] MRN: [id number] DOB: 1961-09-16   Care Coordination Outreach Attempts:  An unsuccessful telephone outreach was attempted today to offer the patient information about available care coordination services as a benefit of their health plan.   Follow Up Plan:  Additional outreach attempts will be made to offer the patient care coordination information and services.   Encounter Outcome:  No Answer   Care Coordination Interventions:  No, not indicated    Lazaro Arms RN, BSN, [facility]   Phone: [phone]

## 2023-01-01 NOTE — Telephone Encounter (Signed)
Rescheduled 01/08/23  [place]  Direct Dial: [phone]

## 2023-01-08 ENCOUNTER — Other Ambulatory Visit: Payer: Self-pay | Admitting: Student

## 2023-01-08 ENCOUNTER — Ambulatory Visit: Payer: Self-pay

## 2023-01-08 NOTE — Patient Instructions (Signed)
Visit Information  Thank you for taking time to visit with me today. Please don't hesitate to contact me if I can be of assistance to you.   Following are the goals we discussed today:   Goals Addressed             This Visit's Progress    I want to gain knowledge to manage and maintain my health condition HTN and Type II DM       Patient Goals/Self Care Activities: -Patient/Caregiver will self-administer medications as prescribed as evidenced by self-report/primary caregiver report  -Patient/Caregiver will attend all scheduled provider appointments as evidenced by clinician review of documented attendance to scheduled appointments and patient/caregiver report -Patient/Caregiver will call pharmacy for medication refills as evidenced by patient report and review of pharmacy fill history as appropriate -Patient/Caregiver will call provider office for new concerns or questions as evidenced by review of documented incoming telephone call notes and patient report -Patient/Caregiver verbalizes understanding of plan  -Calls provider office for new concerns, questions, or BP outside discussed parameters -Checks BP and records as discussed -Follows a low sodium diet/DASH diet -check blood sugar at prescribed times -record values and write them down take them to all doctor visits   -continue to cut down on smoking -drink more water        Our next appointment is by telephone on 02/06/23 at 115 pm  Please call the care guide team at [phone] if you need to cancel or reschedule your appointment.   If you are experiencing a Mental Health or [place] or need someone to talk to, please call 1-800-273-TALK (toll free, 24 hour hotline)  The patient verbalized understanding of instructions, educational materials, and care plan provided today.    Lazaro Arms RN, BSN, [facility]   Phone: [phone]

## 2023-01-08 NOTE — Patient Outreach (Signed)
  Care Coordination   Follow Up Visit Note   01/08/2023 Name: Nicole [name] MRN: [id number] DOB: 1961-04-10  Nicole [name] is a 62 y.o. year old female who sees Nicole Pope, MD for primary care. I spoke with  Nicole [name] by phone today.  What matters to the patients health and wellness today?  I had a conversation with Nicole [name], and she informed me that she is doing well and denies any symptoms of hypertension or diabetes. However, she does not possess a glucometer to monitor her blood sugar levels, and she would like to have one. She indicated that hers went missing while in rehab. I have reached out to her physician regarding the matter. She believes that she knows the location of her blood pressure monitor and will ask her sister to help her find it. I emphasized the significance of being able to keep an eye on her vitals. Additionally, she mentioned that she still smokes one to two cigarettes per day and is making an effort to quit. I explained how smoking can adversely affect her body and circulation and recommended that she drink more water. She assured me that she understood.    Goals Addressed             This Visit's Progress    I want to gain knowledge to manage and maintain my health condition HTN and Type II DM       Patient Goals/Self Care Activities: -Patient/Caregiver will self-administer medications as prescribed as evidenced by self-report/primary caregiver report  -Patient/Caregiver will attend all scheduled provider appointments as evidenced by clinician review of documented attendance to scheduled appointments and patient/caregiver report -Patient/Caregiver will call pharmacy for medication refills as evidenced by patient report and review of pharmacy fill history as appropriate -Patient/Caregiver will call provider office for new concerns or questions as evidenced by review of documented incoming telephone call notes and patient report -Patient/Caregiver  verbalizes understanding of plan  -Calls provider office for new concerns, questions, or BP outside discussed parameters -Checks BP and records as discussed -Follows a low sodium diet/DASH diet -check blood sugar at prescribed times -record values and write them down take them to all doctor visits   -continue to cut down on smoking -drink more water        SDOH assessments and interventions completed:  No     Care Coordination Interventions:  Yes, provided   Interventions Today    Flowsheet Row Most Recent Value  Chronic Disease   Chronic disease during today's visit Other  General Interventions   General Interventions Discussed/Reviewed Communication with  Communication with PCP/Specialists  Education Interventions   Education Provided Provided Education  Provided Verbal Education On Nutrition       Follow up plan: Follow up call scheduled for 02/06/23  115 pm    Encounter Outcome:  Pt. Visit Completed   Lazaro Arms RN, BSN, [facility]   Phone: [phone]

## 2023-01-09 ENCOUNTER — Other Ambulatory Visit: Payer: Self-pay | Admitting: Student

## 2023-02-06 ENCOUNTER — Ambulatory Visit: Payer: Self-pay

## 2023-02-06 NOTE — Patient Instructions (Signed)
Visit Information  Thank you for taking time to visit with me today. Please don't hesitate to contact me if I can be of assistance to you.   Following are the goals we discussed today:   Goals Addressed             This Visit's Progress    I want to gain knowledge to manage and maintain my health condition HTN and Type II DM       Patient Goals/Self Care Activities: -Patient/Caregiver will self-administer medications as prescribed as evidenced by self-report/primary caregiver report  -Patient/Caregiver will attend all scheduled provider appointments as evidenced by clinician review of documented attendance to scheduled appointments and patient/caregiver report -Patient/Caregiver will call pharmacy for medication refills as evidenced by patient report and review of pharmacy fill history as appropriate -Patient/Caregiver will call provider office for new concerns or questions as evidenced by review of documented incoming telephone call notes and patient report -Patient/Caregiver verbalizes understanding of plan  -Calls provider office for new concerns, questions, or BP outside discussed parameters -Checks BP and records as discussed --continue to cut down on smoking -drink more water        Our next appointment is by telephone on 03/07/23 at 2 pm  Please call the care guide team at [phone] if you need to cancel or reschedule your appointment.   If you are experiencing a Mental Health or [place] or need someone to talk to, please call 1-800-273-TALK (toll free, 24 hour hotline)  The patient verbalized understanding of instructions, educational materials, and care plan provided today   Lazaro Arms RN, BSN, [facility]   Phone: [phone]

## 2023-02-06 NOTE — Patient Outreach (Signed)
  Care Coordination   Follow Up Visit Note   02/06/2023 Name: Nicole [name] MRN: [id number] DOB: [birth year]  Nicole [name] is a 62 y.o. year old female who sees Riesa Pope, MD for primary care. I spoke with  Julien Girt by phone today.  What matters to the patients health and wellness today?  I spoke with [name] and she informed me that she is doing well.  She still wants a glucomenteer as I had previously communicated with her physician, and he agreed to give her one. She wants to keep track of her blood sugar levels in case it goes high. [name] mentioned that she checks her blood pressure about 2-3 times a week, and during our conversation, it was 100/69 with a heart rate of 59. I advised her to continue taking her medication and to keep monitoring her blood pressure. She has an upcoming appointment with her primary care physician on 4/2, where they will likely discuss the glucometer further.    Goals Addressed             This Visit's Progress    I want to gain knowledge to manage and maintain my health condition HTN and Type II DM       Patient Goals/Self Care Activities: -Patient/Caregiver will self-administer medications as prescribed as evidenced by self-report/primary caregiver report  -Patient/Caregiver will attend all scheduled provider appointments as evidenced by clinician review of documented attendance to scheduled appointments and patient/caregiver report -Patient/Caregiver will call pharmacy for medication refills as evidenced by patient report and review of pharmacy fill history as appropriate -Patient/Caregiver will call provider office for new concerns or questions as evidenced by review of documented incoming telephone call notes and patient report -Patient/Caregiver verbalizes understanding of plan  -Calls provider office for new concerns, questions, or BP outside discussed parameters -Checks BP and records as discussed --continue to cut down on  smoking -drink more water        SDOH assessments and interventions completed:  No     Care Coordination Interventions:  Yes, provided   Interventions Today    Flowsheet Row Most Recent Value  Chronic Disease   Chronic disease during today's visit Diabetes, Hypertension (HTN)  General Interventions   General Interventions Discussed/Reviewed General Interventions Discussed        Follow up plan: Follow up call scheduled for 03/07/23/ 2 pm    Encounter Outcome:  Pt. Visit Completed   Lazaro Arms RN, BSN, [facility]   Phone: [phone]

## 2023-02-12 ENCOUNTER — Other Ambulatory Visit: Payer: Self-pay

## 2023-02-12 ENCOUNTER — Encounter: Payer: Self-pay | Admitting: Student

## 2023-02-12 ENCOUNTER — Ambulatory Visit (INDEPENDENT_AMBULATORY_CARE_PROVIDER_SITE_OTHER): Payer: Medicare HMO

## 2023-02-12 ENCOUNTER — Other Ambulatory Visit: Payer: Self-pay | Admitting: Student

## 2023-02-12 ENCOUNTER — Ambulatory Visit (INDEPENDENT_AMBULATORY_CARE_PROVIDER_SITE_OTHER): Payer: Medicare HMO | Admitting: Student

## 2023-02-12 VITALS — BP 112/62 | HR 75 | Temp 98.2°F | Ht 60.0 in | Wt 250.8 lb

## 2023-02-12 VITALS — BP 112/62 | HR 75 | Temp 98.2°F | Wt 250.8 lb

## 2023-02-12 DIAGNOSIS — I10 Essential (primary) hypertension: Secondary | ICD-10-CM | POA: Diagnosis not present

## 2023-02-12 DIAGNOSIS — I5189 Other ill-defined heart diseases: Secondary | ICD-10-CM

## 2023-02-12 DIAGNOSIS — E119 Type 2 diabetes mellitus without complications: Secondary | ICD-10-CM | POA: Diagnosis not present

## 2023-02-12 DIAGNOSIS — R600 Localized edema: Secondary | ICD-10-CM | POA: Diagnosis not present

## 2023-02-12 DIAGNOSIS — Z8673 Personal history of transient ischemic attack (TIA), and cerebral infarction without residual deficits: Secondary | ICD-10-CM

## 2023-02-12 DIAGNOSIS — Z0000 Encounter for general adult medical examination without abnormal findings: Secondary | ICD-10-CM | POA: Diagnosis not present

## 2023-02-12 DIAGNOSIS — K219 Gastro-esophageal reflux disease without esophagitis: Secondary | ICD-10-CM | POA: Diagnosis not present

## 2023-02-12 DIAGNOSIS — B20 Human immunodeficiency virus [HIV] disease: Secondary | ICD-10-CM

## 2023-02-12 LAB — POCT GLYCOSYLATED HEMOGLOBIN (HGB A1C): Hemoglobin A1C: 7.7 % — AB (ref 4.0–5.6)

## 2023-02-12 LAB — GLUCOSE, CAPILLARY: Glucose-Capillary: 260 mg/dL — ABNORMAL HIGH (ref 70–99)

## 2023-02-12 MED ORDER — SEMAGLUTIDE(0.25 OR 0.5MG/DOS) 2 MG/3ML ~~LOC~~ SOPN: 0.2500 mg | PEN_INJECTOR | SUBCUTANEOUS | 2 refills | Status: DC

## 2023-02-12 MED ORDER — OLMESARTAN-AMLODIPINE-HCTZ 40-5-12.5 MG PO TABS: 1.0000 | ORAL_TABLET | Freq: Every day | ORAL | 2 refills | Status: DC

## 2023-02-12 MED ORDER — OMEPRAZOLE MAGNESIUM 20 MG PO TBEC: 40.0000 mg | DELAYED_RELEASE_TABLET | Freq: Every day | ORAL | 1 refills | Status: DC
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
## 2023-02-12 NOTE — Assessment & Plan Note (Signed)
Pt complaint on daily aspirin 81 mg and rosuvastatin 20 mg.   > aspirin 81 mg > rosuvastatin 20 mg

## 2023-02-12 NOTE — Progress Notes (Signed)
Attestation for Student Documentation:  I personally was present and performed or re-performed the history, physical exam and medical decision-making activities of this service and have verified that the service and findings are accurately documented in the student's note.  Ms. Columbo is a 62 y/o person living with obesity, prior CVA, Type 2 DM, HTN, and HLD. She presents today for diabetes follow up. In the past she has been on ozempic and farxiga, but was non-adherent to the ozempic and had A1c at goal. Today A1c not at goal, she is also had an increase in weight over the past few weeks. Will restart ozempic at .25mg  and uptitrate back to .5mg  dose.   Weight gain possibly multifactorial. She does have worsening bilateral lower extremity swelling for the last week. She has had bilateral leg edema in the past without symptoms of HF and thought to be consistent with venous insufficiency. Last echocardiogram 12/2021 with right ventricular systolic dysfunction. She has not been evaluated by cardiology, had prior ischemic work up, or heart catheterization. Will plan to refer to cardiology for further evaluation. Last EKG not since 2019, will need repeat to evaluate for any ischemic changes. Today she denies any dyspnea, orthopnea. On examination she is not requiring supplemental oxygen and has no evidence of JVD though she is in a chair so difficult to assess. Will treat with leg elevation and decrease fluid intake. If no improvement in one month would repeat echocardiogram.   Riesa Pope, MD 02/12/2023, 7:32 PM

## 2023-02-12 NOTE — Assessment & Plan Note (Signed)
A1c 7.7 today which is elevated from 6.8 2 months ago. CBG 260. Patient is compliant on her [place]. Denies any diaphoresis, tremors, dizziness, syncopal episodes or other events to suggest hypoglycemia. Additionally, pt has had 13 pound weight gain since visit 2 months ago. Will continue Farxiga 10 mg and add back weekly Ozempic.  > Farxiga 10 mg > weekly Ozempic 0.25 mg for the first 2-3 weeks and increase to 0.5 mg thereafter

## 2023-02-12 NOTE — Assessment & Plan Note (Signed)
Pt complaint on daily genvoya. She follows up with Dr. Megan Salon with upcoming appointment in July 2024.  > continue genvoya 150-150-200-10 daily and follow up with Dr. Megan Salon in July

## 2023-02-12 NOTE — Patient Instructions (Addendum)
Today we discussed the following:  1) Hypertension Your blood pressure was excellent today. Please continue taking your olmesartan-HCTZ-amlodipine daily.   2) Diabetes Your A1c and blood glucose were elevated today. Please continue taking your Farxiga 10 mg. We have added back your Ozempic 0.25 mg which you should take for 2-3 weeks, then increase to 0.5 mg.   3) Leg swelling Please decrease your fluid intake and try to elevate your legs at home. Try to get up and walk around several times a day. You may also begin wearing compression stockings to help with the swelling.   4) History of stroke Please continue taking your daily statin and aspirin  5) HIV Please continue use of your Genvoya and follow up with Dr. Megan Salon   Follow up in 1 month. Please contact us if you experience any chest pain, shortness of breath, or increased leg swelling.    [facility]: [facility] [place], [place] Address: [address] [place], [place], [place] [address]   Phone: [phone]

## 2023-02-12 NOTE — Addendum Note (Signed)
Addended by: Riesa Pope on: 02/12/2023 09:37 PM   Modules accepted: Level of Service

## 2023-02-12 NOTE — Progress Notes (Signed)
Subjective:   Patient ID: Nicole [name] female   DOB: 1961/04/17 62 y.o.   MRN: [id number]  HPI: Nicole [name] is a 62 y.o. female with a PMHx as presented below who presents for follow up. Please see problem based assessment and plan for further work up and management.   Patient Active Problem List   Diagnosis Date Noted   Anxiety 04/24/2022   Chronic cerebrovascular accident (CVA) 10/10/2021   Right leg pain 10/10/2021   Stress at home 10/10/2021   Cramping of hands 03/15/2021   GERD (gastroesophageal reflux disease) 03/15/2021   Neuropathy 12/02/2020   Diabetes 08/16/2017   Poor dentition 06/18/2017   Healthcare maintenance 11/18/2014   Bilateral lower extremity edema 05/19/2014   Chronic cough 08/03/2013   History of hypothyroidism 09/16/2012   HLD (hyperlipidemia) 12/12/2009   Obesity, Class II, BMI 35-39.9 11/30/2008   Smokes tobacco daily 02/24/2007   Essential hypertension, benign 02/24/2007   Allergic rhinitis, cause unspecified 02/24/2007   Osteoarthritis 02/24/2007   Human immunodeficiency virus (HIV) disease 11/12/2000   History of hepatitis C 11/12/2000   HIV (human immunodeficiency virus infection) 11/12/2000     Current Outpatient Medications  Medication Sig Dispense Refill   Semaglutide,0.25 or 0.5MG /DOS, 2 MG/3ML SOPN Inject 0.25 mg into the skin once a week. 3 mL 2   Accu-Chek FastClix Lancets MISC Check blood sugar one time a day 102 each 5   aspirin EC 81 MG tablet Take 1 tablet (81 mg total) by mouth daily. Swallow whole. 90 tablet 3   Blood Glucose Monitoring Suppl (ACCU-CHEK GUIDE ME) w/Device KIT 1 m by Does not apply route every morning. 1 kit 0   Blood Pressure Monitoring (BLOOD PRESSURE CUFF) MISC 1 each by Does not apply route 2 (two) times daily. (Patient not taking: Reported on 12/13/2022) 1 each 0   buPROPion (WELLBUTRIN SR) 150 MG 12 hr tablet TAKE 1 TABLET BY MOUTH EVERY DAY 90 tablet 1    elvitegravir-cobicistat-emtricitabine-tenofovir (GENVOYA) 150-150-200-10 MG TABS tablet Take 1 tablet by mouth daily with breakfast. 30 tablet 11   escitalopram (LEXAPRO) 20 MG tablet TAKE HALF TABLET A DAY FOR 1 WEEK, THEN START TAKING 1 FULL TABLET BY MOUTH DAILY 90 tablet 1   FARXIGA 10 MG TABS tablet TAKE 1 TABLET BY MOUTH DAILY. TAKE 1 TABLET BY MOUTH EVERY DAY BEFORE BREAKFAST STRENGTH: 10 MG 90 tablet 1   gabapentin (NEURONTIN) 300 MG capsule Take 1 capsule (300 mg total) by mouth at bedtime. 90 capsule 3   glucose blood (ACCU-CHEK GUIDE) test strip Check blood sugar one time daily 100 each 4   hydrOXYzine (ATARAX) 10 MG tablet Take 1 tablet (10 mg total) by mouth 3 (three) times daily as needed for anxiety. 30 tablet 0   Olmesartan-amLODIPine-HCTZ 40-5-12.5 MG TABS Take 1 tablet by mouth daily. 90 tablet 2   omeprazole (PRILOSEC OTC) 20 MG tablet Take 2 tablets (40 mg total) by mouth daily. 90 tablet 1   rosuvastatin (CRESTOR) 20 MG tablet TAKE 1 TABLET BY MOUTH EVERY DAY 90 tablet 1   No current facility-administered medications for this visit.     Review of Systems: Pertinent ROS present in the A&P. Otherwise negative,   Objective:   Physical Exam: Vitals:   02/12/23 1538  BP: 112/62  Pulse: 75  Temp: 98.2 F (36.8 C)  TempSrc: Oral  SpO2: 100%  Weight: 250 lb 12.8 oz (113.8 kg)   Constitutional: in no acute distress HENT:  normocephalic atraumatic Eyes: conjunctiva non-erythematous Cardiovascular: regular rate and rhythm, no JVD Pulmonary/Chest: normal work of breathing on room air, lungs clear to auscultation bilaterally MSK: normal bulk and tone. Left lower extremity brace in place. Swelling of the bilateral calves with no overlying erythema or warmth. 2+ DP pulses bilaterally.  Neurological: alert & oriented x 3 Skin: warm and dry.  Psych: appropriate mood and afffect   Assessment & Plan:   Essential hypertension, benign BP 112/62 today. Pt reports checking her  BP once daily at home with systolic readings in the Q000111Q. She is compliant on her olmesartan-amlodipine-HCTZ 40-5-12.5  Denies lightheadedness, syncope, palpitations. Will continue current regimen.  > olmesartan-amlodipine-HCTZ 40-5-12.5     Diabetes (HCC) A1c 7.7 today which is elevated from 6.8 2 months ago. CBG 260. Patient is compliant on her [place]. Denies any diaphoresis, tremors, dizziness, syncopal episodes or other events to suggest hypoglycemia. Additionally, pt has had 13 pound weight gain since visit 2 months ago. Will continue Farxiga 10 mg and add back weekly Ozempic.  > Farxiga 10 mg > weekly Ozempic 0.25 mg for the first 2-3 weeks and increase to 0.5 mg thereafter  Bilateral lower extremity edema Pt complaining of swelling to the bilateral LE for the past week with no associated pain. States she has been drinking more fluids as of late. Family at bedside notes she has been more sedentary recently although she is able to get around with a walker without difficulty. Denies any chest pain, orthopnea, or SOB to suggest CHF. She did complain of similar swelling several years ago with unremarkable ABI in 2022. Suspect symptoms are likely due to inactivity and venous stasis. Will recommend increase activity, leg elevation and compression stockings and follow up in 1 month. Informed patient to return immediately with any new chest pain, SOB, orthopnea, or worsening swelling.   > Leg elevation > compression stockings > Increase activity > return precautions with worsening swelling or new onset chest pain/SOB  Chronic cerebrovascular accident (CVA) Pt complaint on daily aspirin 81 mg and rosuvastatin 20 mg.   > aspirin 81 mg > rosuvastatin 20 mg   HIV (human immunodeficiency virus infection) Pt complaint on daily genvoya. She follows up with Dr. Megan Salon with upcoming appointment in July 2024.  > continue genvoya 150-150-200-10 daily and follow up with Dr. Megan Salon in July

## 2023-02-12 NOTE — Addendum Note (Signed)
Addended by: Riesa Pope on: 02/12/2023 09:36 PM   Modules accepted: Orders

## 2023-02-12 NOTE — Assessment & Plan Note (Signed)
BP 112/62 today. Pt reports checking her BP once daily at home with systolic readings in the Q000111Q. She is compliant on her olmesartan-amlodipine-HCTZ 40-5-12.5  Denies lightheadedness, syncope, palpitations. Will continue current regimen.  > olmesartan-amlodipine-HCTZ 40-5-12.5

## 2023-02-12 NOTE — Assessment & Plan Note (Signed)
Pt complaining of swelling to the bilateral LE for the past week with no associated pain. States she has been drinking more fluids as of late. Family at bedside notes she has been more sedentary recently although she is able to get around with a walker without difficulty. Denies any chest pain, orthopnea, or SOB to suggest CHF. She did complain of similar swelling several years ago with unremarkable ABI in 2022. Suspect symptoms are likely due to inactivity and venous stasis. Will recommend increase activity, leg elevation and compression stockings and follow up in 1 month. Informed patient to return immediately with any new chest pain, SOB, orthopnea, or worsening swelling.   > Leg elevation > compression stockings > Increase activity > return precautions with worsening swelling or new onset chest pain/SOB

## 2023-02-13 NOTE — Progress Notes (Signed)
Internal Medicine Clinic Attending  Case and documentation of Dr. Johnney Ou  soon after the resident saw the patient reviewed.  I reviewed the AWV findings.  I agree with the assessment, diagnosis, and plan of care documented in the AWV note.

## 2023-02-13 NOTE — Progress Notes (Signed)
Internal Medicine Clinic Attending  Case discussed with Dr. Katsadouros  At the time of the visit.  We reviewed the resident's history and exam and pertinent patient test results.  I agree with the assessment, diagnosis, and plan of care documented in the resident's note.  

## 2023-03-06 ENCOUNTER — Ambulatory Visit (INDEPENDENT_AMBULATORY_CARE_PROVIDER_SITE_OTHER): Payer: Medicare HMO | Admitting: Podiatry

## 2023-03-06 DIAGNOSIS — M79674 Pain in right toe(s): Secondary | ICD-10-CM

## 2023-03-06 DIAGNOSIS — B351 Tinea unguium: Secondary | ICD-10-CM

## 2023-03-06 DIAGNOSIS — M79675 Pain in left toe(s): Secondary | ICD-10-CM
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
## 2023-03-07 ENCOUNTER — Ambulatory Visit: Payer: Self-pay

## 2023-03-07 NOTE — Patient Instructions (Signed)
Visit Information  Thank you for taking time to visit with me today. Please don't hesitate to contact me if I can be of assistance to you.   Following are the goals we discussed today:   Goals Addressed             This Visit's Progress    I want to gain knowledge to manage and maintain my health condition HTN and Type II DM       Patient Goals/Self Care Activities: -Patient/Caregiver will self-administer medications as prescribed as evidenced by self-report/primary caregiver report  -Patient/Caregiver will attend all scheduled provider appointments as evidenced by clinician review of documented attendance to scheduled appointments and patient/caregiver report -Patient/Caregiver will call pharmacy for medication refills as evidenced by patient report and review of pharmacy fill history as appropriate -Patient/Caregiver will call provider office for new concerns or questions as evidenced by review of documented incoming telephone call notes and patient report -Patient/Caregiver verbalizes understanding of plan  -Calls provider office for new concerns, questions, or BP outside discussed parameters -Checks BP and records as discussed --continue to cut down on smoking -drink more water Continue to walk more with her walker to build her strength        Our next appointment is by telephone on 04/05/23 at 1130 am  Please call the care guide team at [phone] if you need to cancel or reschedule your appointment.   If you are experiencing a Mental Health or Behavioral Health Crisis or need someone to talk to, please call 1-800-273-TALK (toll free, 24 hour hotline)  The patient verbalized understanding of instructions, educational materials, and care plan provided today.   [name] RN, BSN, [facility] Care Coordinator [facility]   Phone: [phone]

## 2023-03-07 NOTE — Patient Outreach (Signed)
  Care Coordination   Follow Up Visit Note   03/07/2023 Name: Nicole [name] MRN: [id number] DOB: 1961/01/08  Nicole [name] is a 62 y.o. year old female who sees Nicole Agee, MD for primary care. I spoke with  Nicole [name] by phone today.  What matters to the patients health and wellness today?  According to Nicole [name], she is doing well. She visited the office on 02/12/23 and her blood pressure was 112/62, which is good. Her A1c has increased from 6.8 to 7.7 though. She monitors her blood sugars twice a day using her glucometer, and they fluctuate between 109 and 200. We discussed the importance of her food and fluid intake, as well as her medications. Furthermore, she is trying to increase her strength by walking with her walker.    Goals Addressed             This Visit's Progress    I want to gain knowledge to manage and maintain my health condition HTN and Type II DM       Patient Goals/Self Care Activities: -Patient/Caregiver will self-administer medications as prescribed as evidenced by self-report/primary caregiver report  -Patient/Caregiver will attend all scheduled provider appointments as evidenced by clinician review of documented attendance to scheduled appointments and patient/caregiver report -Patient/Caregiver will call pharmacy for medication refills as evidenced by patient report and review of pharmacy fill history as appropriate -Patient/Caregiver will call provider office for new concerns or questions as evidenced by review of documented incoming telephone call notes and patient report -Patient/Caregiver verbalizes understanding of plan  -Calls provider office for new concerns, questions, or BP outside discussed parameters -Checks BP and records as discussed --continue to cut down on smoking -drink more water Continue to walk more with her walker to build her strength        SDOH assessments and interventions completed:  No     Care Coordination  Interventions:  Yes, provided   Interventions Today    Flowsheet Row Most Recent Value  Chronic Disease   Chronic disease during today's visit Diabetes, Hypertension (HTN)  General Interventions   General Interventions Discussed/Reviewed General Interventions Reviewed  Nutrition Interventions   Nutrition Discussed/Reviewed Nutrition Discussed  Safety Interventions   Safety Discussed/Reviewed Safety Discussed        Follow up plan: Follow up call scheduled for 04/05/23 1130 am    Encounter Outcome:  Pt. Visit Completed   Nicole Fairly RN, BSN, [facility] Care Coordinator [facility]   Phone: [phone]

## 2023-03-14 ENCOUNTER — Telehealth: Payer: Self-pay

## 2023-03-14 DIAGNOSIS — B20 Human immunodeficiency virus [HIV] disease: Secondary | ICD-10-CM

## 2023-03-14 NOTE — Telephone Encounter (Signed)
Patient returned phone call.  I asked patient about rescheduling her appointment with a different provider due to Dr. Orvan Falconer retiring. Patient reported she wants to go to IM to see Dr. [name]. I provided the patient with the office number and placed a referral for her. Yohance Hathorne T Pricilla Loveless

## 2023-03-14 NOTE — Telephone Encounter (Signed)
Left VM to reschedule 7/30 appt with a new provider

## 2023-03-14 NOTE — Addendum Note (Signed)
Addended by: Tressa Busman T on: 03/14/2023 03:44 PM   Modules accepted: Orders

## 2023-03-19 ENCOUNTER — Encounter: Payer: Self-pay | Admitting: Student

## 2023-03-19 ENCOUNTER — Ambulatory Visit (INDEPENDENT_AMBULATORY_CARE_PROVIDER_SITE_OTHER): Payer: Medicare HMO | Admitting: Student

## 2023-03-19 ENCOUNTER — Other Ambulatory Visit: Payer: Self-pay

## 2023-03-19 VITALS — BP 127/72 | HR 71 | Temp 98.0°F | Ht 68.0 in | Wt 248.4 lb

## 2023-03-19 DIAGNOSIS — R600 Localized edema: Secondary | ICD-10-CM

## 2023-03-19 DIAGNOSIS — Z7984 Long term (current) use of oral hypoglycemic drugs: Secondary | ICD-10-CM

## 2023-03-19 DIAGNOSIS — E119 Type 2 diabetes mellitus without complications: Secondary | ICD-10-CM | POA: Diagnosis not present

## 2023-03-19 DIAGNOSIS — Z7985 Long-term (current) use of injectable non-insulin antidiabetic drugs: Secondary | ICD-10-CM | POA: Diagnosis not present

## 2023-03-19 DIAGNOSIS — Z0000 Encounter for general adult medical examination without abnormal findings: Secondary | ICD-10-CM

## 2023-03-19 MED ORDER — SEMAGLUTIDE(0.25 OR 0.5MG/DOS) 2 MG/3ML ~~LOC~~ SOPN
0.5000 mg | PEN_INJECTOR | SUBCUTANEOUS | 2 refills | Status: DC
Start: 2023-03-19 — End: 2023-12-10

## 2023-03-19 NOTE — Assessment & Plan Note (Signed)
Last A1c 7.7 in April from 6.8. She is on Farxiga 10 mg. Restarted Ozempic at last visit, which she is tolerating well for past 4 weeks on the 0.25 mg dose. Plan to titrate up today to 0.5 mg weekly. Weight relatively unchanged from last visit.   Plan -continue Farxiga 10 mg daily -increase Ozempic to 0.5 mg weekly  -repeat A1c in 2 months

## 2023-03-19 NOTE — Patient Instructions (Addendum)
Thank you, Nicole [name] for allowing [place] to provide your care today. Today we discussed diabetes.  Diabetes -increase Ozempic from 0.25 mg to 0.5 mg once a week -continue your Farxiga   Blood Pressure -at goal today, please continue your olmesartan-amlodipine-HCTZ  Leg swelling -I am glad to hear that your leg swelling is improving -You can continue with elevating your legs and compression stockings if that helps with the swelling.  -Please contact the clinic if you develop symptoms such as chest pain, shortness of breath or worsening leg swelling. -Please make sure to go to your appointment with cardiology (heart doctor) on 5/31.   I have ordered the following labs for you:  Lab Orders  No laboratory test(s) ordered today    I have ordered the following medication/changed the following medications:   Stop the following medications: Medications Discontinued During This Encounter  Medication Reason   Semaglutide,0.25 or 0.5MG /DOS, 2 MG/3ML SOPN Reorder     Start the following medications: Meds ordered this encounter  Medications   Semaglutide,0.25 or 0.5MG /DOS, 2 MG/3ML SOPN    Sig: Inject 0.5 mg into the skin once a week.    Dispense:  3 mL    Refill:  2     Follow up: 2-3 months   Should you have any questions or concerns please call the internal medicine clinic at [phone].    Rana Snare, D.O. [facility]
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
## 2023-03-19 NOTE — Assessment & Plan Note (Addendum)
Patient and family member state that patient's bilateral LE edema is improving compared to last OV. Denies any leg pain, chest pain or dyspnea. She has tried elevating her legs. Does not have compression stockings on today. On exam, bilateral LE edema most prominently around ankles but normal work of breathing and lungs were CTAB. Last echo showed RV dysfunction, referral to cardiology placed at last OV. She has appointment with cardiology on 5/31.   Plan -continue leg elevation and compression stockings -patient to see cardiology on 5/31

## 2023-03-19 NOTE — Assessment & Plan Note (Signed)
Provided contact information again to have mammogram scheduled.

## 2023-03-20 NOTE — Addendum Note (Signed)
Addended by: Dickie La on: 03/20/2023 01:06 PM   Modules accepted: Level of Service

## 2023-03-20 NOTE — Progress Notes (Signed)
Internal Medicine Clinic Attending  Case discussed with Dr. Zheng  At the time of the visit.  We reviewed the resident's history and exam and pertinent patient test results.  I agree with the assessment, diagnosis, and plan of care documented in the resident's note.  

## 2023-04-03 ENCOUNTER — Ambulatory Visit: Payer: Medicare HMO

## 2023-04-03 VITALS — BP 138/76 | HR 67 | Temp 97.0°F | Ht 68.0 in

## 2023-04-03 DIAGNOSIS — I10 Essential (primary) hypertension: Secondary | ICD-10-CM | POA: Diagnosis not present

## 2023-04-03 DIAGNOSIS — Z7985 Long-term (current) use of injectable non-insulin antidiabetic drugs: Secondary | ICD-10-CM

## 2023-04-03 DIAGNOSIS — Z8673 Personal history of transient ischemic attack (TIA), and cerebral infarction without residual deficits: Secondary | ICD-10-CM | POA: Diagnosis not present

## 2023-04-03 DIAGNOSIS — E119 Type 2 diabetes mellitus without complications: Secondary | ICD-10-CM | POA: Diagnosis not present

## 2023-04-03 DIAGNOSIS — Z7984 Long term (current) use of oral hypoglycemic drugs: Secondary | ICD-10-CM | POA: Diagnosis not present

## 2023-04-03 DIAGNOSIS — I639 Cerebral infarction, unspecified: Secondary | ICD-10-CM

## 2023-04-03 NOTE — Assessment & Plan Note (Signed)
Current medications include Olmesartan-amLODIPine-HCTZ 40-5-12.5 MG daily. Patient states that they are compliant with these medications. Patient denies HA, lightheadedness, dizziness, CP, or SOB. Initial BP today is 138/76.     Plan: - Continue Olmesartan-amLODIPine-HCTZ 40-5-12.5 MG daily
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
## 2023-04-03 NOTE — Assessment & Plan Note (Addendum)
Patient has difficulty ambulating due to prior stroke with residual left-sided hemiparesis. Patient typically gets around with a wheelchair but her wheelchair recently broke. She is in need of a new wheelchair. She is also requesting a new orthotic brace for her LLE. Referral to [facility] was placed at visit earlier this year.   Plan: - DME wheelchair ordered  - Provided with contact information for [facility] (orthotics) for new brace - Ordered referral for neuro rehab to assess her wheelchair needs with orthotic braces

## 2023-04-03 NOTE — Patient Instructions (Addendum)
Thank you for coming to see [place] in clinic Ms. Sharee Pimple.  Plan:  - Please see the following providers:     - [facility]: Prosthetics & Orthotics (please call to schedule an appointment)   Address: [address], [place], [place] [address]   Phone: [phone]   - Ophthalmology (eye doctor) for yearly eye exams (please call to schedule an appointment)   [facility] [place] Address: [address], [place], [place] [address] Phone: [phone]  It was very nice to see you, thank you for allowing [place] to be involved in your care. We look forward to seeing you next time. Please call our clinic at [phone] if you have any questions or concerns. The best time to call is Monday-Friday from 9am-4pm, but there is someone available 24/7. If after hours or the weekend, call the main hospital number at [phone] and ask for the Internal Medicine Resident On-Call. If you need medication refills, please notify your pharmacy one week in advance and they will send [place] a request.   Please make sure to arrive 15 minutes prior to your next appointment. If you arrive late, you may be asked to reschedule.

## 2023-04-03 NOTE — Assessment & Plan Note (Signed)
Current medications include Farxiga 10 MG daily and Semaglutide 0.5 mg weekly. Patient states that they are compliant with these medications. Patient denies polyuria, polydipsia, fatigue. Patient states that they have not yet been able to visit the ophthalmologist for yearly eye exams. A1c was 7.7% 1 month ago.  Plan: - Continue Farxiga 10 MG daily and Semaglutide 0.5 mg weekly - F/u ophthalmology (provided with contact information)  - Repeat A1c in 2 months

## 2023-04-04 ENCOUNTER — Telehealth: Payer: Self-pay

## 2023-04-04 NOTE — Telephone Encounter (Signed)
Blondine patiens sister called she stated the patient needs a referral sent to [facility] for psychological evaluation.  [facility] Address: [address] [place], [place], [place] [address] Hours:  Closes soon ? 5?PM ? Opens 9?AM Fri Phone: [phone]

## 2023-04-05 ENCOUNTER — Ambulatory Visit: Payer: Self-pay

## 2023-04-05 NOTE — Patient Outreach (Signed)
  Care Coordination   Follow Up Visit Note   04/05/2023 Name: Nicole [name] MRN: [id number] DOB: 13-Nov-1960  Nicole [name] is a 62 y.o. year old female who sees Belva Agee, MD for primary care. I spoke with  [name] by phone today.  What matters to the patients health and wellness today?  Mrs. Faucette commitment to her health is commendable. She checks her blood sugars and blood pressure daily, ensuring her health is well-monitored. Today, her blood sugar was 164, and her blood pressure was 136/80. She has no issues with her medications, eating, or sleeping.     Goals Addressed             This Visit's Progress    I want to gain knowledge to manage and maintain my health condition HTN and Type II DM       Patient Goals/Self Care Activities: -Patient/Caregiver will self-administer medications as prescribed as evidenced by self-report/primary caregiver report  -Patient/Caregiver will attend all scheduled provider appointments as evidenced by clinician review of documented attendance to scheduled appointments and patient/caregiver report -Patient/Caregiver will call pharmacy for medication refills as evidenced by patient report and review of pharmacy fill history as appropriate -Patient/Caregiver will call provider office for new concerns or questions as evidenced by review of documented incoming telephone call notes and patient report -Patient/Caregiver verbalizes understanding of plan  -Calls provider office for new concerns, questions, or BP outside discussed parameters -Checks BP and records as discussed --continue to cut down on smoking -drink more water Continue to walk more with her walker to build her strength        SDOH assessments and interventions completed:  No     Care Coordination Interventions:  Yes, provided   Interventions Today    Flowsheet Row Most Recent Value  Chronic Disease   Chronic disease during today's visit Diabetes, Hypertension  (HTN)  General Interventions   General Interventions Discussed/Reviewed General Interventions Discussed  Nutrition Interventions   Nutrition Discussed/Reviewed Nutrition Discussed  Pharmacy Interventions   Pharmacy Dicussed/Reviewed Pharmacy Topics Discussed  Safety Interventions   Safety Discussed/Reviewed Safety Discussed        Follow up plan: Follow up call scheduled for 06/07/23 245 pm    Encounter Outcome:  Pt. Visit Completed   [name] RN, BSN, [facility] Care Coordinator [facility]   Phone: [phone]

## 2023-04-05 NOTE — Patient Instructions (Signed)
Visit Information  Thank you for taking time to visit with me today. Please don't hesitate to contact me if I can be of assistance to you.   Following are the goals we discussed today:   Goals Addressed             This Visit's Progress    I want to gain knowledge to manage and maintain my health condition HTN and Type II DM       Patient Goals/Self Care Activities: -Patient/Caregiver will self-administer medications as prescribed as evidenced by self-report/primary caregiver report  -Patient/Caregiver will attend all scheduled provider appointments as evidenced by clinician review of documented attendance to scheduled appointments and patient/caregiver report -Patient/Caregiver will call pharmacy for medication refills as evidenced by patient report and review of pharmacy fill history as appropriate -Patient/Caregiver will call provider office for new concerns or questions as evidenced by review of documented incoming telephone call notes and patient report -Patient/Caregiver verbalizes understanding of plan  -Calls provider office for new concerns, questions, or BP outside discussed parameters -Checks BP and records as discussed --continue to cut down on smoking -drink more water Continue to walk more with her walker to build her strength        Our next appointment is by telephone on 06/07/23 at 245 pm  Please call the care guide team at [phone] if you need to cancel or reschedule your appointment.   If you are experiencing a Mental Health or Behavioral Health Crisis or need someone to talk to, please call 1-800-273-TALK (toll free, 24 hour hotline)  The patient verbalized understanding of instructions, educational materials, and care plan provided today.    [name] RN, BSN, [facility] Care Coordinator [facility]   Phone: [phone]

## 2023-04-06 NOTE — Progress Notes (Signed)
Internal Medicine Clinic Attending  Case discussed with Dr. Mapp  At the time of the visit.  We reviewed the resident's history and exam and pertinent patient test results.  I agree with the assessment, diagnosis, and plan of care documented in the resident's note.  

## 2023-04-10 ENCOUNTER — Telehealth: Payer: Self-pay

## 2023-04-10 ENCOUNTER — Telehealth: Payer: Self-pay | Admitting: Student

## 2023-04-10 DIAGNOSIS — Z8673 Personal history of transient ischemic attack (TIA), and cerebral infarction without residual deficits: Secondary | ICD-10-CM

## 2023-04-10 NOTE — Telephone Encounter (Signed)
Called patient to reschedule appointment with Dr. Orvan Falconer who is now retired. Offer patient to reschedule with another provider, but she stated that she would like to reschedule with outpatient. She asked if we have a doctor she can see at the hospital. I did inform her that we do not unless she is currently admitted that sometimes there is a provider that rounds, but that may be internal medicine she was thinking of located at [facility].   She politely declined rescheduling her year f/u , but let patient know if she needed of anything she is welcome to give [place] a call and will be happy to help and schedule with another provider here.

## 2023-04-10 NOTE — Telephone Encounter (Signed)
I spoke with patient's sister, Blondine.  She states that in order to have patient approved for a Day Program, she needs to have a psychological testing for brain function.  She states that they need a referral in order to have Medicare cover for it.  They would like to have it done at [facility].   Ambulatory referral to psychology placed.

## 2023-04-12 ENCOUNTER — Encounter: Payer: Self-pay | Admitting: Internal Medicine

## 2023-04-12 ENCOUNTER — Ambulatory Visit: Payer: Medicare HMO | Attending: Internal Medicine | Admitting: Internal Medicine

## 2023-04-12 VITALS — BP 136/74 | HR 75 | Ht 68.0 in | Wt 256.2 lb

## 2023-04-12 DIAGNOSIS — Z8673 Personal history of transient ischemic attack (TIA), and cerebral infarction without residual deficits: Secondary | ICD-10-CM

## 2023-04-12 DIAGNOSIS — I4891 Unspecified atrial fibrillation: Secondary | ICD-10-CM | POA: Diagnosis not present

## 2023-04-12 DIAGNOSIS — R0602 Shortness of breath: Secondary | ICD-10-CM | POA: Diagnosis not present
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
## 2023-04-12 NOTE — Patient Instructions (Signed)
Medication Instructions:   *If you need a refill on your cardiac medications before your next appointment, please call your pharmacy*   Lab Work: Bmet and pro bnp  If you have labs (blood work) drawn today and your tests are completely normal, you will receive your results only by: MyChart Message (if you have MyChart) OR A paper copy in the mail If you have any lab test that is abnormal or we need to change your treatment, we will call you to review the results.   Testing/Procedures: Your physician has requested that you have an echocardiogram. Echocardiography is a painless test that uses sound waves to create images of your heart. It provides your doctor with information about the size and shape of your heart and how well your heart's chambers and valves are working. This procedure takes approximately one hour. There are no restrictions for this procedure. Please do NOT wear cologne, perfume, aftershave, or lotions (deodorant is allowed). Please arrive 15 minutes prior to your appointment time.    Follow-Up: At [facility], you and your health needs are our priority.  As part of our continuing mission to provide you with exceptional heart care, we have created designated Provider Care Teams.  These Care Teams include your primary Cardiologist (physician) and Advanced Practice Providers (APPs -  Physician Assistants and Nurse Practitioners) who all work together to provide you with the care you need, when you need it.  We recommend signing up for the patient portal called "MyChart".  Sign up information is provided on this After Visit Summary.  MyChart is used to connect with patients for Virtual Visits (Telemedicine).  Patients are able to view lab/test results, encounter notes, upcoming appointments, etc.  Non-urgent messages can be sent to your provider as well.   To learn more about what you can do with MyChart, go to [email or web address].    Your next appointment: Will  plan after testing

## 2023-04-13 LAB — PRO B NATRIURETIC PEPTIDE: NT-Pro BNP: <36 pg/mL (ref 0–287)

## 2023-04-13 LAB — BASIC METABOLIC PANEL
BUN/Creatinine Ratio: 18 (ref 12–28)
BUN: 10 mg/dL (ref 8–27)
CO2: 24 mmol/L (ref 20–29)
Calcium: 9.7 mg/dL (ref 8.7–10.3)
Chloride: 101 mmol/L (ref 96–106)
Creatinine, Ser: 0.57 mg/dL (ref 0.57–1.00)
Glucose: 128 mg/dL — ABNORMAL HIGH (ref 70–99)
Potassium: 3.8 mmol/L (ref 3.5–5.2)
Sodium: 140 mmol/L (ref 134–144)
eGFR: 102 mL/min/{1.73_m2} (ref >59)

## 2023-04-16 ENCOUNTER — Encounter: Payer: Self-pay | Admitting: Student

## 2023-05-01 ENCOUNTER — Ambulatory Visit: Payer: Medicare HMO

## 2023-05-07 ENCOUNTER — Ambulatory Visit: Payer: No Typology Code available for payment source | Admitting: Physical Therapy

## 2023-05-07 DIAGNOSIS — M6281 Muscle weakness (generalized): Secondary | ICD-10-CM

## 2023-05-07 NOTE — Therapy (Signed)
Session was arrive no charge. Patient arrives to session with daughter. Her former W/C broke but she has been walking more with rollator since then and no longer feels as though she needs a new wheelchair. Sister is in agreement. Wheelchair eval not indicated due to patient request. Patient is interested in pursuing outpatient physical therapy. Therapist provides business care and explains process of getting a referral to this setting.   Maryruth Eve, PT, DPT

## 2023-05-14 ENCOUNTER — Other Ambulatory Visit: Payer: Self-pay | Admitting: Infectious Diseases

## 2023-05-14 ENCOUNTER — Ambulatory Visit (HOSPITAL_COMMUNITY): Payer: Medicare HMO | Attending: Internal Medicine

## 2023-05-14 ENCOUNTER — Other Ambulatory Visit (INDEPENDENT_AMBULATORY_CARE_PROVIDER_SITE_OTHER): Payer: Medicare HMO

## 2023-05-14 ENCOUNTER — Other Ambulatory Visit: Payer: Self-pay | Admitting: Student

## 2023-05-14 DIAGNOSIS — R0602 Shortness of breath: Secondary | ICD-10-CM | POA: Insufficient documentation

## 2023-05-14 DIAGNOSIS — Z8673 Personal history of transient ischemic attack (TIA), and cerebral infarction without residual deficits: Secondary | ICD-10-CM | POA: Diagnosis present

## 2023-05-14 DIAGNOSIS — I4891 Unspecified atrial fibrillation: Secondary | ICD-10-CM | POA: Insufficient documentation

## 2023-05-14 DIAGNOSIS — E785 Hyperlipidemia, unspecified: Secondary | ICD-10-CM | POA: Diagnosis not present

## 2023-05-14 DIAGNOSIS — Z113 Encounter for screening for infections with a predominantly sexual mode of transmission: Secondary | ICD-10-CM

## 2023-05-14 DIAGNOSIS — B20 Human immunodeficiency virus [HIV] disease: Secondary | ICD-10-CM

## 2023-05-14 DIAGNOSIS — E119 Type 2 diabetes mellitus without complications: Secondary | ICD-10-CM

## 2023-05-14 DIAGNOSIS — Z79899 Other long term (current) drug therapy: Secondary | ICD-10-CM

## 2023-05-14 LAB — ECHOCARDIOGRAM COMPLETE
Area-P 1/2: 4.08 cm2
S' Lateral: 1.8 cm

## 2023-05-15 LAB — CBC WITH DIFFERENTIAL/PLATELET
Basophils Absolute: 0 10*3/uL (ref 0.0–0.2)
Basos: 0 %
EOS (ABSOLUTE): 0.1 10*3/uL (ref 0.0–0.4)
Eos: 1 %
Hematocrit: 44.5 % (ref 34.0–46.6)
Hemoglobin: 14.8 g/dL (ref 11.1–15.9)
Immature Grans (Abs): 0 10*3/uL (ref 0.0–0.1)
Immature Granulocytes: 0 %
Lymphocytes Absolute: 1.4 10*3/uL (ref 0.7–3.1)
Lymphs: 26 %
MCH: 30.5 pg (ref 26.6–33.0)
MCHC: 33.3 g/dL (ref 31.5–35.7)
MCV: 92 fL (ref 79–97)
Monocytes Absolute: 0.6 10*3/uL (ref 0.1–0.9)
Monocytes: 10 %
Neutrophils Absolute: 3.5 10*3/uL (ref 1.4–7.0)
Neutrophils: 63 %
Platelets: 170 10*3/uL (ref 150–450)
RBC: 4.86 x10E6/uL (ref 3.77–5.28)
RDW: 12.5 % (ref 11.7–15.4)
WBC: 5.6 10*3/uL (ref 3.4–10.8)

## 2023-05-15 LAB — LIPID PANEL
Chol/HDL Ratio: 3.9 ratio (ref 0.0–4.4)
Cholesterol, Total: 148 mg/dL (ref 100–199)
HDL: 38 mg/dL — ABNORMAL LOW (ref >39)
LDL Chol Calc (NIH): 92 mg/dL (ref 0–99)
Triglycerides: 93 mg/dL (ref 0–149)
VLDL Cholesterol Cal: 18 mg/dL (ref 5–40)

## 2023-05-15 LAB — CMP14 + ANION GAP
ALT: 51 IU/L — ABNORMAL HIGH (ref 0–32)
AST: 43 IU/L — ABNORMAL HIGH (ref 0–40)
Albumin: 4 g/dL (ref 3.9–4.9)
Alkaline Phosphatase: 76 IU/L (ref 44–121)
Anion Gap: 15 mmol/L (ref 10.0–18.0)
BUN/Creatinine Ratio: 15 (ref 12–28)
BUN: 9 mg/dL (ref 8–27)
Bilirubin Total: 0.3 mg/dL (ref 0.0–1.2)
CO2: 22 mmol/L (ref 20–29)
Calcium: 9.2 mg/dL (ref 8.7–10.3)
Chloride: 107 mmol/L — ABNORMAL HIGH (ref 96–106)
Creatinine, Ser: 0.61 mg/dL (ref 0.57–1.00)
Globulin, Total: 3.6 g/dL (ref 1.5–4.5)
Glucose: 122 mg/dL — ABNORMAL HIGH (ref 70–99)
Potassium: 3.6 mmol/L (ref 3.5–5.2)
Sodium: 144 mmol/L (ref 134–144)
Total Protein: 7.6 g/dL (ref 6.0–8.5)
eGFR: 100 mL/min/{1.73_m2} (ref >59)

## 2023-05-15 LAB — T-HELPER CELL (CD4) - (RCID CLINIC ONLY)
CD4 % Helper T Cell: 38 % (ref 33–65)
CD4 T Cell Abs: 519 /uL (ref 400–1790)

## 2023-05-15 LAB — HIV-1 RNA QUANT-NO REFLEX-BLD
HIV-1 RNA Viral Load Log: 3.61 log10copy/mL
HIV-1 RNA Viral Load: 4070 copies/mL

## 2023-05-15 LAB — RPR: RPR Ser Ql: NONREACTIVE

## 2023-05-22 ENCOUNTER — Telehealth: Payer: Self-pay

## 2023-05-22 NOTE — Telephone Encounter (Signed)
-----   Message from Dietrich Pates V, MD sent at 05/15/2023  2:52 PM EDT ----- LV function is vigorous     RV function is normal Normal valve function   How is swelling n legs?

## 2023-05-22 NOTE — Telephone Encounter (Signed)
Pt advised her Echo results and notes that she still ha some edema in her feet only.. she says it is better in the morning and worsens throughout the day.. I talked to her about elevating them when sitting, wearing compression stockings during the day only and to watch her NA intake.   I will forward to Dr Tenny Craw.

## 2023-05-23 NOTE — Telephone Encounter (Signed)
Agree with recommendation

## 2023-05-28 ENCOUNTER — Other Ambulatory Visit: Payer: Self-pay

## 2023-05-28 ENCOUNTER — Other Ambulatory Visit: Payer: Self-pay | Admitting: Infectious Diseases

## 2023-05-28 ENCOUNTER — Ambulatory Visit (INDEPENDENT_AMBULATORY_CARE_PROVIDER_SITE_OTHER): Payer: Medicare HMO | Admitting: Infectious Diseases

## 2023-05-28 ENCOUNTER — Encounter: Payer: Self-pay | Admitting: Infectious Diseases

## 2023-05-28 VITALS — BP 130/78 | HR 79 | Temp 98.1°F | Resp 32 | Ht 68.0 in | Wt 256.6 lb

## 2023-05-28 DIAGNOSIS — F17210 Nicotine dependence, cigarettes, uncomplicated: Secondary | ICD-10-CM

## 2023-05-28 DIAGNOSIS — Z8619 Personal history of other infectious and parasitic diseases: Secondary | ICD-10-CM

## 2023-05-28 DIAGNOSIS — E119 Type 2 diabetes mellitus without complications: Secondary | ICD-10-CM

## 2023-05-28 DIAGNOSIS — Z7984 Long term (current) use of oral hypoglycemic drugs: Secondary | ICD-10-CM

## 2023-05-28 DIAGNOSIS — I639 Cerebral infarction, unspecified: Secondary | ICD-10-CM

## 2023-05-28 DIAGNOSIS — F17200 Nicotine dependence, unspecified, uncomplicated: Secondary | ICD-10-CM

## 2023-05-28 DIAGNOSIS — B20 Human immunodeficiency virus [HIV] disease: Secondary | ICD-10-CM

## 2023-05-28 DIAGNOSIS — I10 Essential (primary) hypertension: Secondary | ICD-10-CM

## 2023-05-28 NOTE — Assessment & Plan Note (Signed)
Will recheck her HIV RNA and genotype today as she is detectable.  Gets COVID and flu vax. Encouraged her to get these when available.  Received PCV 20 in 2022.  Will see her back in 1 month.

## 2023-05-28 NOTE — Assessment & Plan Note (Signed)
Encouraged to quit.  Would like patches.

## 2023-05-28 NOTE — Assessment & Plan Note (Addendum)
Pt does not recall tx. Per prev ID note tx and cured (at [facility] ?) Will recheck her RNA Will also check her Hep B SAb after series.

## 2023-05-28 NOTE — Assessment & Plan Note (Signed)
Appreciate her PCP f/u Her A1C has gone up.

## 2023-05-28 NOTE — Progress Notes (Addendum)
Subjective:    Patient ID: Nicole [name], female  DOB: [birth year], 62 y.o.        MRN: [id number]   HPI 62 yo F with hx of HIV+ since 1980-1990. She has been on genvoya for a "long time".  Unable to recall other medications. No hx of HIV related hospitalizations.  She has DM2 since 80-90s. No hospitalizations in the last 2 years.  Her FSG at home have been "pretty good". 120-130 per her sister. Occas numbness in her hands. Has brace on L leg due to "drop foot" due to accident at 3-4 yo.  Here with sister.  Denies missed meds per her sister.  Has hospital bed at home Would like to restart PT.   She has not seen ophhto yet this year.   HIV 1 RNA Quant  Date Value  06/05/2022 Not Detected Copies/mL  11/14/2020 <20 Copies/mL  06/04/2019 <20 NOT DETECTED copies/mL   HIV-1 RNA Viral Load (copies/mL)  Date Value  05/14/2023 4,070   CD4 T Cell Abs (/uL)  Date Value  05/14/2023 519  06/05/2022 1,067  11/14/2020 1,328     Health Maintenance  Topic Date Due   Zoster Vaccines- Shingrix (1 of 2) Never done   OPHTHALMOLOGY EXAM  01/26/2020   MAMMOGRAM  12/28/2021   COVID-19 Vaccine (4 - 2023-24 season) 07/13/2022   HEMOGLOBIN A1C  05/14/2023   INFLUENZA VACCINE  06/13/2023   Diabetic kidney evaluation - Urine ACR  12/05/2023   FOOT EXAM  12/05/2023   Medicare Annual Wellness (AWV)  02/12/2024   Diabetic kidney evaluation - eGFR measurement  05/13/2024   DTaP/Tdap/Td (2 - Td or Tdap) 05/17/2027   Colonoscopy  06/01/2032   Hepatitis C Screening  Completed   HIV Screening  Completed   HPV VACCINES  Aged Out    The past medical history, family history and social history were reviewed/updated in EPIC TTE 05-2023:  1. Small intracavitary gradient related to dynamic function. Left  ventricular ejection fraction, by estimation, is 70 to 75%. Left  ventricular ejection fraction by 3D volume is 73 %. The left ventricle has  hyperdynamic function. The left ventricle has  no  regional wall motion abnormalities. Left ventricular diastolic  parameters were normal.   2. Right ventricular systolic function is normal. The right ventricular  size is normal.   3. The mitral valve is normal in structure. No evidence of mitral valve  regurgitation. No evidence of mitral stenosis.   4. The aortic valve is tricuspid. Aortic valve regurgitation is not  visualized.   5. The inferior vena cava is normal in size with greater than 50%  respiratory variability, suggesting right atrial pressure of 3 mmHg.   Review of Systems  Constitutional:  Negative for chills, fever and weight loss.  Respiratory:  Negative for cough and shortness of breath.   Cardiovascular:  Positive for leg swelling. Negative for chest pain.  Gastrointestinal:  Negative for constipation and diarrhea.  Genitourinary:  Negative for dysuria.  Neurological:  Positive for sensory change.    Please see HPI. All other systems reviewed and negative.     Objective:  Physical Exam Vitals reviewed.  Constitutional:      Appearance: Normal appearance. She is obese.  HENT:     Mouth/Throat:     Mouth: Mucous membranes are moist.     Pharynx: No oropharyngeal exudate.  Eyes:     Extraocular Movements: Extraocular movements intact.     Pupils: Pupils are equal,  round, and reactive to light.  Cardiovascular:     Rate and Rhythm: Normal rate and regular rhythm.  Pulmonary:     Effort: Pulmonary effort is normal.     Breath sounds: Normal breath sounds.  Abdominal:     General: Bowel sounds are normal. There is no distension.     Palpations: Abdomen is soft.     Tenderness: There is no abdominal tenderness.  Musculoskeletal:     Right lower leg: Edema present.     Left lower leg: Edema present.  Neurological:     Mental Status: She is alert.            Assessment & Plan:

## 2023-05-30 LAB — REFLEX TO GENOSURE(R) MG

## 2023-05-30 LAB — HCV RNA QUANT: Hepatitis C Quantitation: NOT DETECTED IU/mL

## 2023-05-30 LAB — HIV-1 RNA ULTRAQUANT REFLEX TO GENTYP+
HIV1 RNA # SerPl PCR: 2620 copies/mL
HIV1 RNA Plas PCR-Log#: 3.418 log10copy/mL

## 2023-05-30 LAB — HEPATITIS B SURFACE ANTIBODY,QUALITATIVE: Hep B Surface Ab, Qual: NONREACTIVE

## 2023-06-07 ENCOUNTER — Telehealth: Payer: Self-pay

## 2023-06-07 NOTE — Patient Outreach (Signed)
  Care Coordination   06/07/2023 Name: ZYNIYA LACONTE MRN: [id number] DOB: 1961-01-25   Care Coordination Outreach Attempts:  An unsuccessful telephone outreach was attempted today to offer the patient information about available care coordination services.  Follow Up Plan:  Additional outreach attempts will be made to offer the patient care coordination information and services.   Encounter Outcome:  No Answer   Care Coordination Interventions:  No, not indicated    [name] RN, BSN, [facility] Care Coordinator [facility]   Phone: [phone]

## 2023-06-11 ENCOUNTER — Ambulatory Visit: Payer: Medicare Other | Admitting: Internal Medicine

## 2023-06-18 ENCOUNTER — Encounter: Payer: Medicare HMO | Admitting: Infectious Diseases

## 2023-06-20 ENCOUNTER — Telehealth: Payer: Self-pay | Admitting: *Deleted

## 2023-06-20 ENCOUNTER — Other Ambulatory Visit: Payer: Self-pay | Admitting: *Deleted

## 2023-06-20 NOTE — Telephone Encounter (Signed)
Spoke with patient regarding her mammogram appointment scheduled for August 21.2024 @ 4:00 pm. Patient is aware of the $75 no show fee charge, patient was instructed to call the breast center is needing to reschedule or to cancel this appointment / also appointment mailed to patient.

## 2023-06-24 ENCOUNTER — Ambulatory Visit: Payer: Medicare HMO | Admitting: Podiatrist

## 2023-07-03 ENCOUNTER — Ambulatory Visit
Admission: RE | Admit: 2023-07-03 | Discharge: 2023-07-03 | Disposition: A | Discharge status: Home / Self Care | Payer: Medicare HMO | Source: Ambulatory Visit | Attending: Internal Medicine | Admitting: Internal Medicine

## 2023-07-03 ENCOUNTER — Ambulatory Visit: Payer: Self-pay

## 2023-07-03 DIAGNOSIS — Z1231 Encounter for screening mammogram for malignant neoplasm of breast: Secondary | ICD-10-CM

## 2023-07-03 NOTE — Patient Outreach (Signed)
  Care Coordination   Follow Up Visit Note   07/03/2023 Name: Nicole [name] MRN: [id number] DOB: 04-15-1961  Nicole [name] is a 62 y.o. year old female who sees [name], MD for primary care. I spoke with  [name] by phone today.  What matters to the patients health and wellness today?  Nicole [name] said she is doing well. She states that her blood pressure has been elevated, the last one being 170/89. Her blood sugars have been good, from 117 to 125. She has been eating and sleeping with no problems and denies any chest pain or headaches. I advised her to discuss the changes in her blood pressure if the trend continues.    Goals Addressed             This Visit's Progress    I want to gain knowledge to manage and maintain my health condition HTN and Type II DM       Patient Goals/Self Care Activities: -Patient/Caregiver will self-administer medications as prescribed as evidenced by self-report/primary caregiver report  -Patient/Caregiver will attend all scheduled provider appointments as evidenced by clinician review of documented attendance to scheduled appointments and patient/caregiver report -Patient/Caregiver will call provider office for new concerns or questions as evidenced by review of documented incoming telephone call notes and patient report -Calls provider office for new concerns, questions, or BP outside discussed parameters -Checks BP and records as discussed --continue to cut down on smoking -drink more water -Continue to walk more with her walker to build her strength -Monitor the elevations in blood pressure and call physician if they continue        SDOH assessments and interventions completed:  No     Care Coordination Interventions:  Yes, provided   Interventions Today    Flowsheet Row Most Recent Value  Chronic Disease   Chronic disease during today's visit Hypertension (HTN), Diabetes  General Interventions   General Interventions  Discussed/Reviewed General Interventions Reviewed  Exercise Interventions   Exercise Discussed/Reviewed Physical Activity  Physical Activity Discussed/Reviewed Physical Activity Discussed  Education Interventions   Education Provided Provided Education  Provided Verbal Education On Nutrition  Nutrition Interventions   Nutrition Discussed/Reviewed Nutrition Discussed, Decreasing salt  Pharmacy Interventions   Pharmacy Dicussed/Reviewed Pharmacy Topics Discussed  Safety Interventions   Safety Discussed/Reviewed Safety Reviewed        Follow up plan: Follow up call scheduled for 08/06/23 1030 am    Encounter Outcome:  Pt. Visit Completed   [name] RN, BSN, [facility]   Care Coordinator Phone: [phone]

## 2023-07-03 NOTE — Patient Instructions (Signed)
Visit Information  Thank you for taking time to visit with me today. Please don't hesitate to contact me if I can be of assistance to you.   Following are the goals we discussed today:   Goals Addressed             This Visit's Progress    I want to gain knowledge to manage and maintain my health condition HTN and Type II DM       Patient Goals/Self Care Activities: -Patient/Caregiver will self-administer medications as prescribed as evidenced by self-report/primary caregiver report  -Patient/Caregiver will attend all scheduled provider appointments as evidenced by clinician review of documented attendance to scheduled appointments and patient/caregiver report -Patient/Caregiver will call provider office for new concerns or questions as evidenced by review of documented incoming telephone call notes and patient report -Calls provider office for new concerns, questions, or BP outside discussed parameters -Checks BP and records as discussed --continue to cut down on smoking -drink more water -Continue to walk more with her walker to build her strength -Monitor the elevations in blood pressure and call physician if they continue        Our next appointment is by telephone on 08/06/23 at 1030 am  Please call the care guide team at [phone] if you need to cancel or reschedule your appointment.   If you are experiencing a Mental Health or Behavioral Health Crisis or need someone to talk to, please call 1-800-273-TALK (toll free, 24 hour hotline)  The patient verbalized understanding of instructions, educational materials, and care plan provided today.    [name] RN, BSN, [facility] [employer] Phone: [phone]

## 2023-07-08 ENCOUNTER — Other Ambulatory Visit: Payer: Self-pay | Admitting: Internal Medicine

## 2023-07-08 DIAGNOSIS — R928 Other abnormal and inconclusive findings on diagnostic imaging of breast: Secondary | ICD-10-CM

## 2023-07-23 ENCOUNTER — Ambulatory Visit
Admission: RE | Admit: 2023-07-23 | Discharge: 2023-07-23 | Disposition: A | Discharge status: Home / Self Care | Payer: Medicare HMO | Source: Ambulatory Visit | Attending: Internal Medicine | Admitting: Internal Medicine

## 2023-07-23 ENCOUNTER — Other Ambulatory Visit: Payer: Self-pay | Admitting: Internal Medicine

## 2023-07-23 DIAGNOSIS — R590 Localized enlarged lymph nodes: Secondary | ICD-10-CM | POA: Diagnosis not present

## 2023-07-23 DIAGNOSIS — R928 Other abnormal and inconclusive findings on diagnostic imaging of breast: Secondary | ICD-10-CM

## 2023-07-23 DIAGNOSIS — N6489 Other specified disorders of breast: Secondary | ICD-10-CM | POA: Diagnosis not present

## 2023-07-23 DIAGNOSIS — R921 Mammographic calcification found on diagnostic imaging of breast: Secondary | ICD-10-CM | POA: Diagnosis not present

## 2023-07-24 ENCOUNTER — Other Ambulatory Visit: Payer: Self-pay | Admitting: Internal Medicine

## 2023-07-24 DIAGNOSIS — R921 Mammographic calcification found on diagnostic imaging of breast: Secondary | ICD-10-CM

## 2023-07-28 IMAGING — MR MR HEAD W/O CM
10 series · 41 of 48 positions shown · non-contrast
Comparison: None.

CLINICAL DATA: 60-year-old female with weakness, neurologic
deficit. Query subacute infarct.

EXAM:
MRI HEAD WITHOUT CONTRAST
TECHNIQUE: Multiplanar, multiecho pulse sequences of the brain and surrounding
structures were obtained without intravenous contrast.

[Series 5: dwi_tracew · axial · 3.0mm · 1.08mm/px · z∈[-83,+62]mm · 8 of 102 slices shown]
[im 1/102]
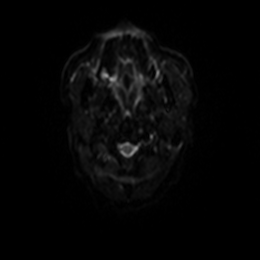
[im 19/102]
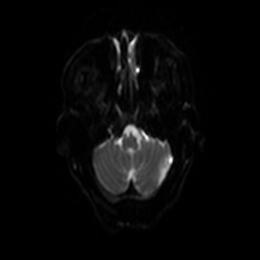
[im 28/102]
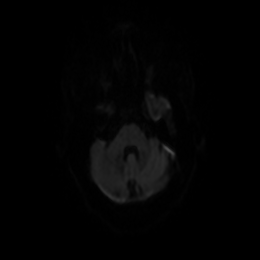
[im 46/102]
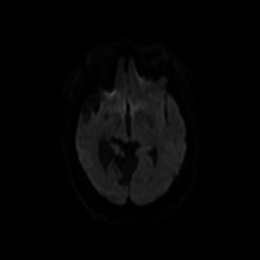
[im 56/102]
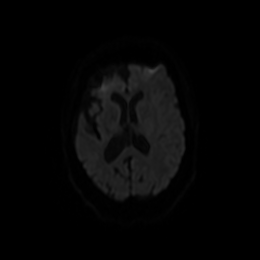
[im 74/102]
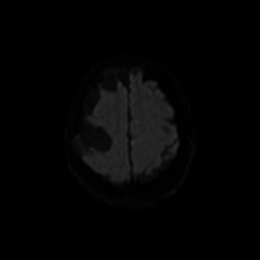
[im 83/102]
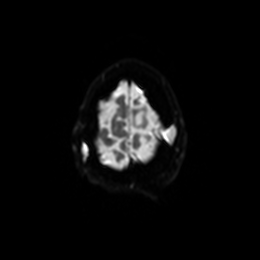
[im 102/102]
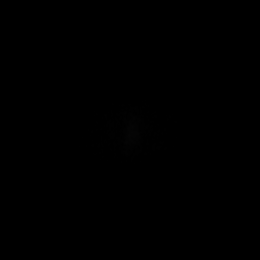

[Series 6: dwi_adc · axial · 3.0mm · 1.08mm/px · z∈[-83,-48]mm · 2 of 51 slices shown]
[im 1/51]
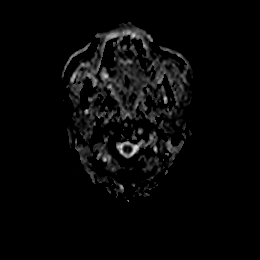
[im 13/51]
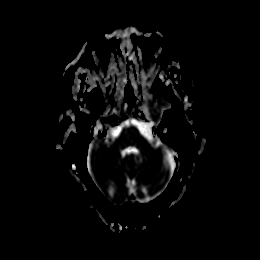

[Series 7: T2 · sagittal · 5.0mm · 0.47mm/px · 2 of 22 slices shown (1 of 3)]
[im 1/22]
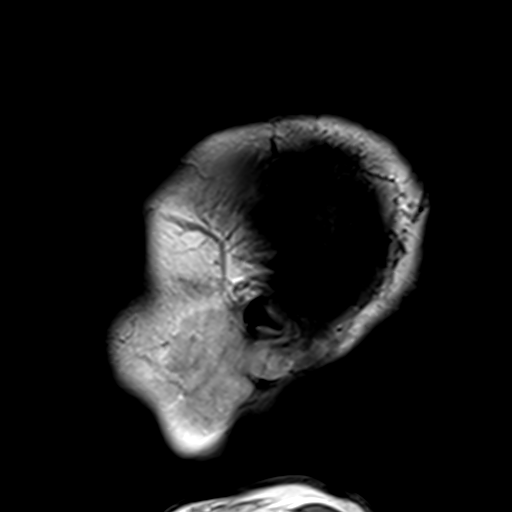
[im 22/22]
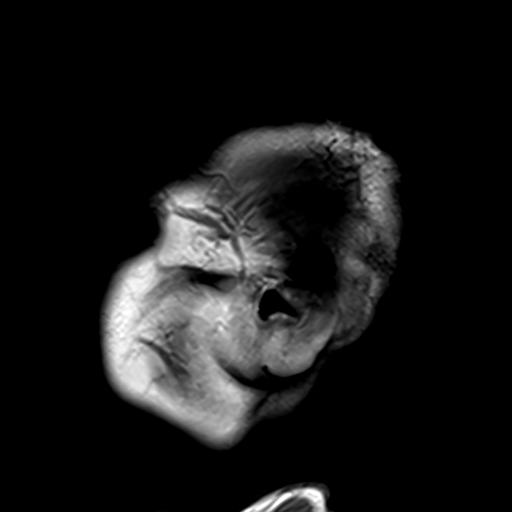

[Series 8: T2 · axial · 5.0mm · 0.45mm/px · z∈[-71,+60]mm · 2 of 22 slices shown (2 of 3)]
[im 1/22]
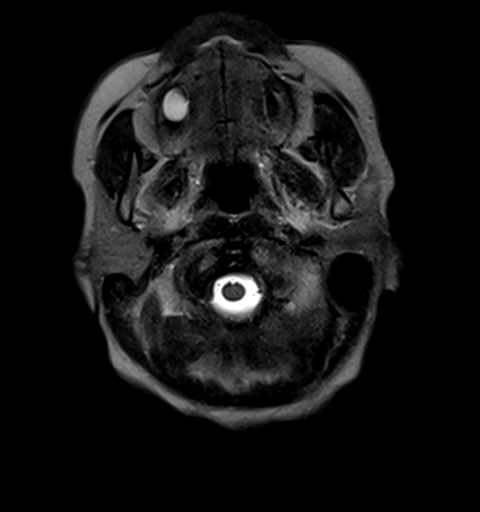
[im 22/22]
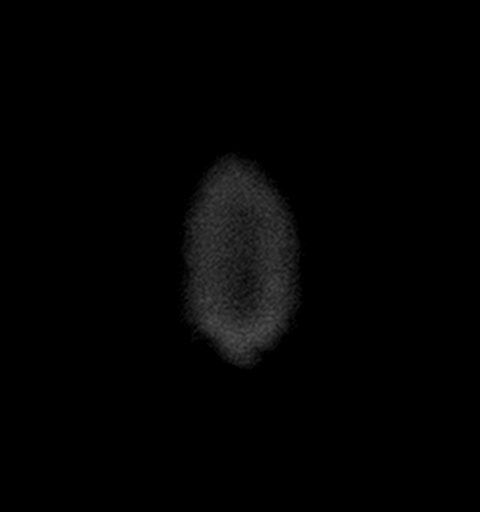

[Series 9: GRE · axial · 3.0mm · 0.45mm/px · z∈[-78,+66]mm · 5 of 51 slices shown]
[im 1/51]
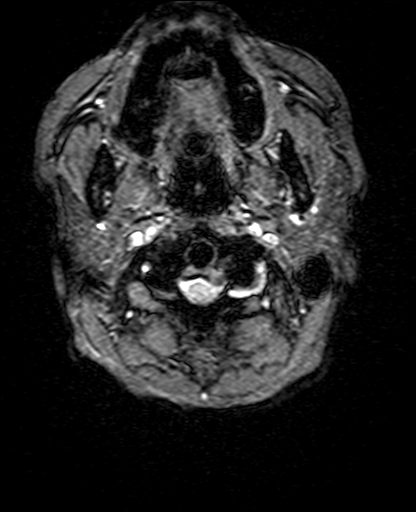
[im 13/51]
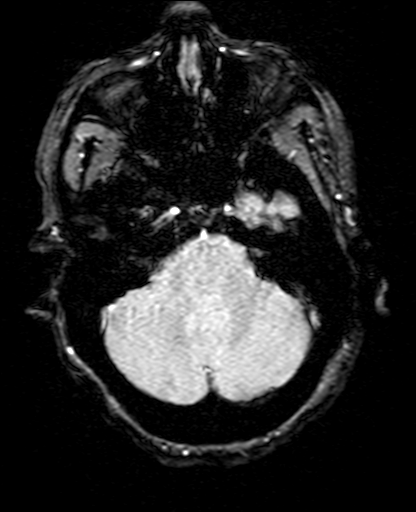
[im 26/51]
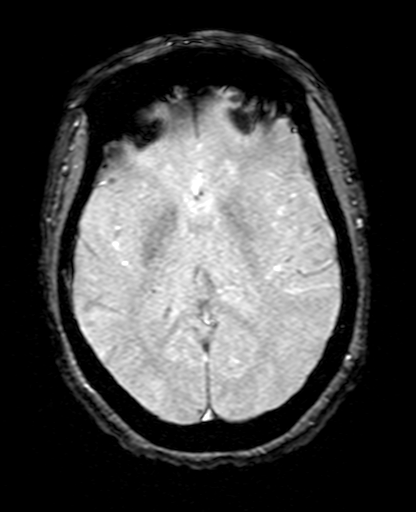
[im 38/51]
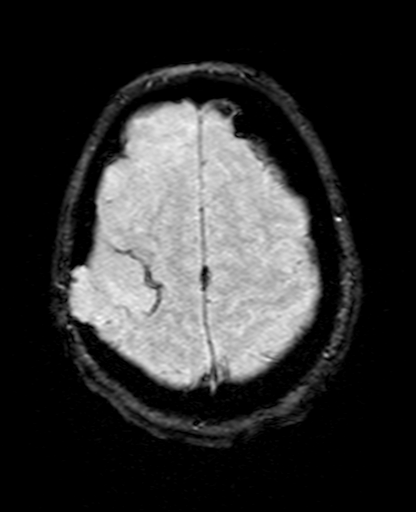
[im 51/51]
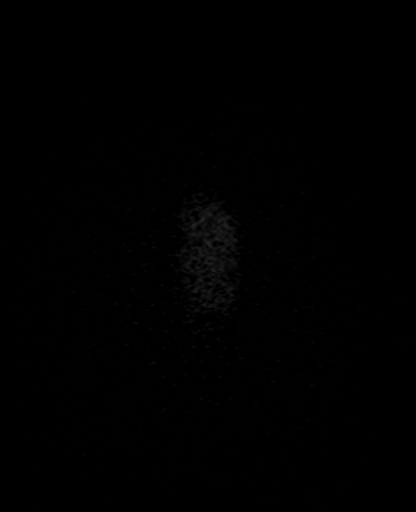

[Series 10: FLAIR · axial · 3.0mm · 0.86mm/px · z∈[-77,+67]mm · 5 of 51 slices shown]
[im 1/51]
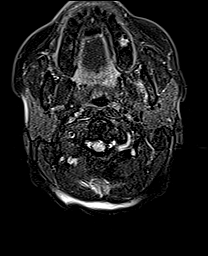
[im 13/51]
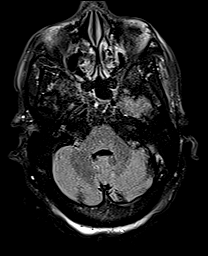
[im 26/51]
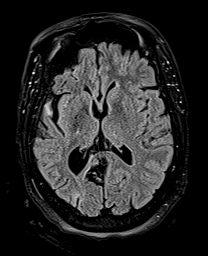
[im 38/51]
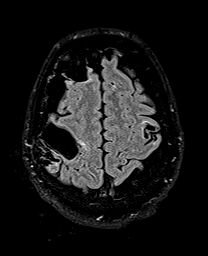
[im 51/51]
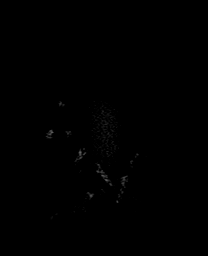

[Series 11: T1 · axial · 3.0mm · 0.45mm/px · z∈[-78,+66]mm · 5 of 51 slices shown]
[im 1/51]
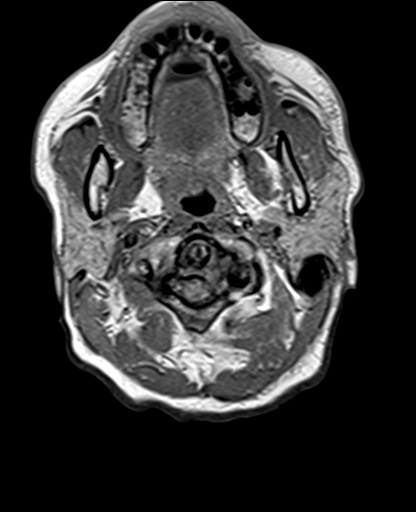
[im 13/51]
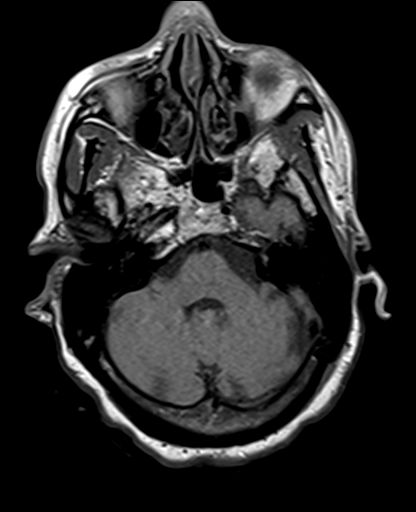
[im 26/51]
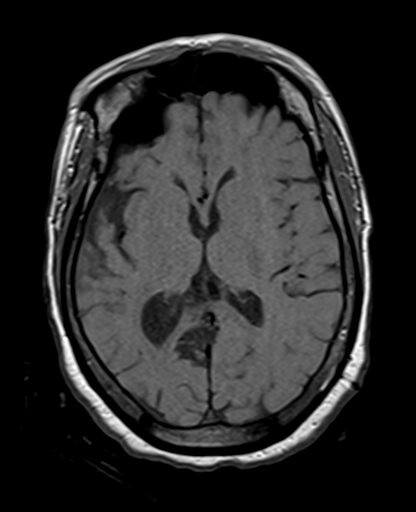
[im 38/51]
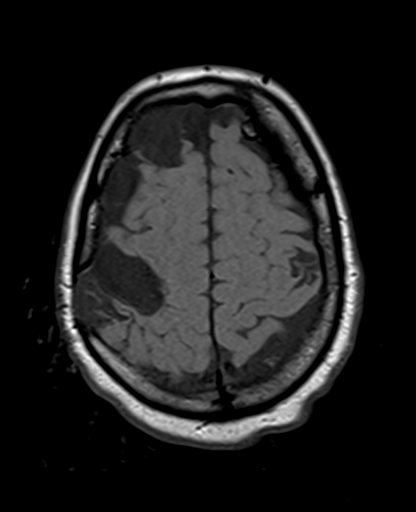
[im 51/51]
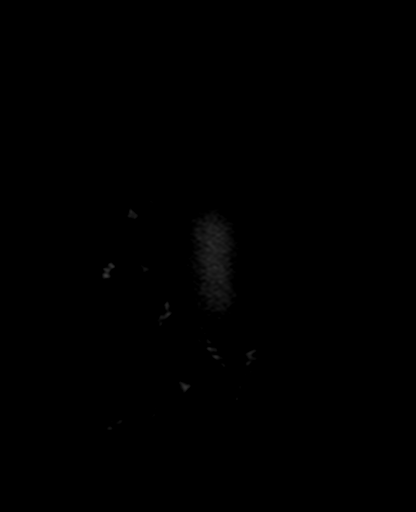

[Series 12: DWI · coronal · 5.0mm · 1.31mm/px · 6 of 55 slices shown (1 of 2)]
[im 1/55]
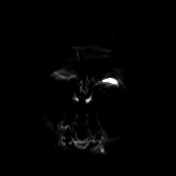
[im 11/55]
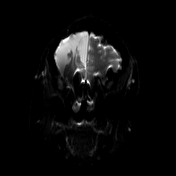
[im 22/55]
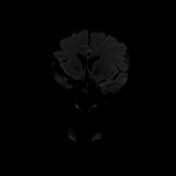
[im 33/55]
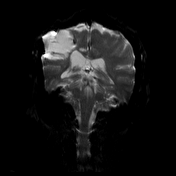
[im 44/55]
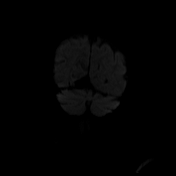
[im 55/55]
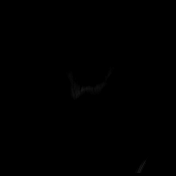

[Series 13: DWI · coronal · 5.0mm · 1.31mm/px · 3 of 28 slices shown (2 of 2)]
[im 1/28]
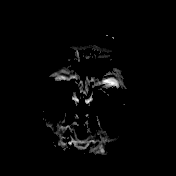
[im 14/28]
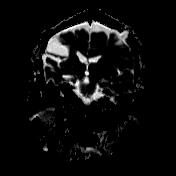
[im 28/28]
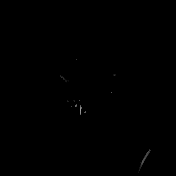

[Series 14: T2 · coronal · 5.0mm · 0.86mm/px · 3 of 28 slices shown (3 of 3)]
[im 1/28]
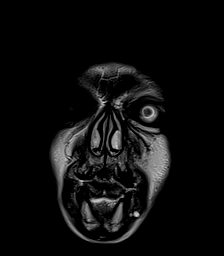
[im 14/28]
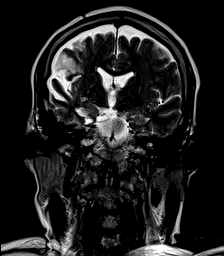
[im 28/28]
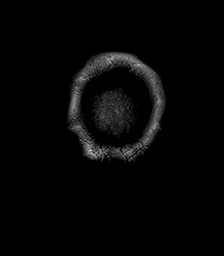

[41 of 48 positions shown; findings below may reference images not displayed]

FINDINGS: Brain: Previous bilateral calvarial burr holes. Underlying cystic
encephalomalacia at both sites. Mild hemosiderin associated on the
right side. And additional chronic encephalomalacia and
architectural distortion involving the right MCA territory -
including the anterior right temporal tip. Some associated right
hemisphere arachnoid adhesions. Mild right lateral ventricle ex
vacuo enlargement.

Some associated right brainstem Wallerian degeneration.

Superimposed right PCA territory cortical encephalomalacia also.

No superimposed restricted diffusion to suggest acute infarction. No
midline shift, mass effect, evidence of mass lesion,
ventriculomegaly, or acute intracranial hemorrhage. Cervicomedullary
junction and pituitary are within normal limits.

Additional scattered mild nonspecific bilateral cerebral white
matter T2 and FLAIR hyperintensity. Deep gray nuclei relatively
spared. Cerebellum within normal limits.

Vascular: Major intracranial vascular flow voids are preserved.

Skull and upper cervical spine: Previous bilateral calvarium burr
holes. Visualized bone marrow signal is within normal limits.
Partially visible multilevel mild cervical spine degeneration with
up to mild degenerative spinal stenosis.

Sinuses/Orbits: Negative orbits. Scattered mild paranasal sinus
mucosal thickening or small mucous retention cysts.

Other: Mastoids are clear. Visible internal auditory structures
appear normal. Negative visible scalp and face soft tissues.
IMPRESSION: 1. No acute intracranial abnormality. No subacute ischemia
identified.

2. However, chronic ischemia in the right MCA and right PCA
territories, and superimposed previous bilateral craniotomy with
underlying cystic encephalomalacia and mild hemosiderin. Right
midbrain Wallerian degeneration.

3. Partially visible cervical spine degeneration with up to mild
associated degenerative spinal stenosis.

## 2023-08-02 ENCOUNTER — Ambulatory Visit
Admission: RE | Admit: 2023-08-02 | Discharge: 2023-08-02 | Disposition: A | Discharge status: Home / Self Care | Payer: Medicare HMO | Source: Ambulatory Visit | Attending: Internal Medicine | Admitting: Internal Medicine

## 2023-08-02 DIAGNOSIS — R921 Mammographic calcification found on diagnostic imaging of breast: Secondary | ICD-10-CM | POA: Diagnosis not present

## 2023-08-02 DIAGNOSIS — D0512 Intraductal carcinoma in situ of left breast: Secondary | ICD-10-CM | POA: Diagnosis not present

## 2023-08-02 DIAGNOSIS — C50212 Malignant neoplasm of upper-inner quadrant of left female breast: Secondary | ICD-10-CM | POA: Diagnosis not present

## 2023-08-02 DIAGNOSIS — C50919 Malignant neoplasm of unspecified site of unspecified female breast: Secondary | ICD-10-CM

## 2023-08-02 HISTORY — PX: BREAST BIOPSY: SHX20

## 2023-08-02 HISTORY — DX: Malignant neoplasm of unspecified site of unspecified female breast: C50.919

## 2023-08-05 LAB — SURGICAL PATHOLOGY

## 2023-08-06 ENCOUNTER — Telehealth: Payer: Self-pay

## 2023-08-06 ENCOUNTER — Ambulatory Visit: Payer: Self-pay | Admitting: Surgery

## 2023-08-06 DIAGNOSIS — C50412 Malignant neoplasm of upper-outer quadrant of left female breast: Secondary | ICD-10-CM

## 2023-08-06 NOTE — Telephone Encounter (Signed)
Pre-operative Risk Assessment    Patient Name: Nicole [name]  DOB: 09-23-1961 MRN: [id number]      Request for Surgical Clearance    Procedure:   LUMPECTOMY SURGERY  Date of Surgery:  Clearance TBD                                 Surgeon:  DR. Harriette Bouillon Surgeon's Group or Practice Name:  [facility] Phone number:  [phone] Fax number:  [phone] ATTN: MICHELLE BROOKS    Type of Clearance Requested:   - Medical    Type of Anesthesia:  General    Additional requests/questions:    SignedMichaelle Copas   08/06/2023, 5:47 PM

## 2023-08-07 ENCOUNTER — Telehealth: Payer: Self-pay | Admitting: Radiation Oncology

## 2023-08-07 ENCOUNTER — Telehealth: Payer: Self-pay

## 2023-08-07 NOTE — Telephone Encounter (Signed)
9/25 @ 10:03 am Left voicemail for patient to call our office to be schedule for consult.

## 2023-08-07 NOTE — Telephone Encounter (Signed)
Name: Nicole [name]  DOB: 22-Feb-1961  MRN: [id number]  Primary Cardiologist: None   Preoperative team, please contact this patient and set up a phone call appointment for further preoperative risk assessment. Please obtain consent and complete medication review. Thank you for your help.  I confirm that guidance regarding antiplatelet and oral anticoagulation therapy has been completed and, if necessary, noted below.  None requested    Ronney Asters, NP 08/07/2023, 8:31 AM [facility]

## 2023-08-07 NOTE — Telephone Encounter (Signed)
  Patient Consent for Virtual Visit         Nicole [name] has provided verbal consent on 08/07/2023 for a virtual visit (video or telephone).   CONSENT FOR VIRTUAL VISIT FOR:  Nicole [name]  By participating in this virtual visit I agree to the following:  I hereby voluntarily request, consent and authorize [facility] and its employed or contracted physicians, physician assistants, nurse practitioners or other licensed health care professionals (the Practitioner), to provide me with telemedicine health care services (the "Services") as deemed necessary by the treating Practitioner. I acknowledge and consent to receive the Services by the Practitioner via telemedicine. I understand that the telemedicine visit will involve communicating with the Practitioner through live audiovisual communication technology and the disclosure of certain medical information by electronic transmission. I acknowledge that I have been given the opportunity to request an in-person assessment or other available alternative prior to the telemedicine visit and am voluntarily participating in the telemedicine visit.  I understand that I have the right to withhold or withdraw my consent to the use of telemedicine in the course of my care at any time, without affecting my right to future care or treatment, and that the Practitioner or I may terminate the telemedicine visit at any time. I understand that I have the right to inspect all information obtained and/or recorded in the course of the telemedicine visit and may receive copies of available information for a reasonable fee.  I understand that some of the potential risks of receiving the Services via telemedicine include:  Delay or interruption in medical evaluation due to technological equipment failure or disruption; Information transmitted may not be sufficient (e.g. poor resolution of images) to allow for appropriate medical decision making by the  Practitioner; and/or  In rare instances, security protocols could fail, causing a breach of personal health information.  Furthermore, I acknowledge that it is my responsibility to provide information about my medical history, conditions and care that is complete and accurate to the best of my ability. I acknowledge that Practitioner's advice, recommendations, and/or decision may be based on factors not within their control, such as incomplete or inaccurate data provided by me or distortions of diagnostic images or specimens that may result from electronic transmissions. I understand that the practice of medicine is not an exact science and that Practitioner makes no warranties or guarantees regarding treatment outcomes. I acknowledge that a copy of this consent can be made available to me via my patient portal [facility] MyChart), or I can request a printed copy by calling the office of [facility].    I understand that my insurance will be billed for this visit.   I have read or had this consent read to me. I understand the contents of this consent, which adequately explains the benefits and risks of the Services being provided via telemedicine.  I have been provided ample opportunity to ask questions regarding this consent and the Services and have had my questions answered to my satisfaction. I give my informed consent for the services to be provided through the use of telemedicine in my medical care

## 2023-08-07 NOTE — Telephone Encounter (Signed)
Patient schedule for tele visit on 08/20/23 for preop clearance. Med rec and consent done

## 2023-08-08 ENCOUNTER — Telehealth: Payer: Self-pay | Admitting: Radiation Oncology

## 2023-08-08 ENCOUNTER — Ambulatory Visit: Payer: Self-pay

## 2023-08-08 NOTE — Telephone Encounter (Signed)
9/26 @ 8:22 am Left voicemail on patient and patient's sister (Blondine) contact numbers for patient to call our office to be schedule for consult.

## 2023-08-08 NOTE — Patient Instructions (Signed)
Visit Information  Thank you for taking time to visit with me today. Please don't hesitate to contact me if I can be of assistance to you.   Following are the goals we discussed today:   Goals Addressed             This Visit's Progress    I want to gain knowledge to manage and maintain my health condition HTN and Type II DM       Patient Goals/Self Care Activities: -Patient/Caregiver will self-administer medications as prescribed as evidenced by self-report/primary caregiver report  -Patient/Caregiver will attend all scheduled provider appointments as evidenced by clinician review of documented attendance to scheduled appointments and patient/caregiver report -Patient/Caregiver will call provider office for new concerns or questions as evidenced by review of documented incoming telephone call notes and patient report -Calls provider office for new concerns, questions, or BP outside discussed parameters -Checks BP and records as discussed -continue to cut down on smoking -drink more water -Continue to walk more with walker to build strength -Monitor the elevations in blood pressure and call physician if they continue        Our next appointment is by telephone on 10/01/23 at 11 am  Please call the care guide team at [phone] if you need to cancel or reschedule your appointment.   If you are experiencing a Mental Health or Behavioral Health Crisis or need someone to talk to, please call 1-800-273-TALK (toll free, 24 hour hotline)  The patient verbalized understanding of instructions, educational materials, and care plan provided today.   [name] RN, BSN, [facility] [employer] Phone: [phone]

## 2023-08-08 NOTE — Patient Outreach (Signed)
Care Coordination   Follow Up Visit Note   08/08/2023 Name: Nicole [name] MRN: [id number] DOB: 1961-05-21  Nicole [name] is a 62 y.o. year old female who sees [name], MD for primary care. I spoke with  [name] by phone today.  What matters to the patients health and wellness today?  During my discussion with [name], she indicated no signs of headaches or chest pain. She is checking her blood pressure and blood sugars, with present readings at 101 and 129/80, respectively. Additionally, she disclosed undergoing a mammogram, which revealed the presence of three discernible spots necessitating removal. With this discovery, she has a positive outlook, which is encouraging.       Goals Addressed             This Visit's Progress    I want to gain knowledge to manage and maintain my health condition HTN and Type II DM       Patient Goals/Self Care Activities: -Patient/Caregiver will self-administer medications as prescribed as evidenced by self-report/primary caregiver report  -Patient/Caregiver will attend all scheduled provider appointments as evidenced by clinician review of documented attendance to scheduled appointments and patient/caregiver report -Patient/Caregiver will call provider office for new concerns or questions as evidenced by review of documented incoming telephone call notes and patient report -Calls provider office for new concerns, questions, or BP outside discussed parameters -Checks BP and records as discussed -continue to cut down on smoking -drink more water -Continue to walk more with walker to build strength -Monitor the elevations in blood pressure and call physician if they continue        SDOH assessments and interventions completed:  No     Care Coordination Interventions:  Yes, provided   Interventions Today    Flowsheet Row Most Recent Value  Chronic Disease   Chronic disease during today's visit Diabetes, Hypertension (HTN)   General Interventions   General Interventions Discussed/Reviewed General Interventions Discussed, General Interventions Reviewed  Nutrition Interventions   Nutrition Discussed/Reviewed Nutrition Discussed, Fluid intake, Decreasing salt, Carbohydrate meal planning  Pharmacy Interventions   Pharmacy Dicussed/Reviewed Pharmacy Topics Discussed  Safety Interventions   Safety Discussed/Reviewed Safety Discussed, Home Safety        Follow up plan: Follow up call scheduled for 10/01/23  11 am    Encounter Outcome:  Patient Visit Completed   [name] RN, BSN, [facility]   Care Coordinator Phone: [phone]

## 2023-08-12 ENCOUNTER — Ambulatory Visit (INDEPENDENT_AMBULATORY_CARE_PROVIDER_SITE_OTHER): Payer: Medicare HMO | Admitting: Podiatry

## 2023-08-12 ENCOUNTER — Encounter: Payer: Self-pay | Admitting: Podiatry

## 2023-08-12 DIAGNOSIS — M79674 Pain in right toe(s): Secondary | ICD-10-CM

## 2023-08-12 DIAGNOSIS — M205X2 Other deformities of toe(s) (acquired), left foot: Secondary | ICD-10-CM

## 2023-08-12 DIAGNOSIS — B351 Tinea unguium: Secondary | ICD-10-CM | POA: Diagnosis not present

## 2023-08-12 DIAGNOSIS — L84 Corns and callosities: Secondary | ICD-10-CM

## 2023-08-12 DIAGNOSIS — M79675 Pain in left toe(s): Secondary | ICD-10-CM | POA: Diagnosis not present
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
## 2023-08-14 NOTE — Progress Notes (Signed)
New Breast Cancer Diagnosis: Left Breast UIQ / UOQ  Did patient present with symptoms (if so, please note symptoms) or screening mammography?:Screening Calcifications    Location and Extent of disease :left breast cluster calcifications noted in the upper inner and upper outer quadrants.  Adenopathy {:[id number]}.  Histology per Pathology Report: grade 3, Invasive Ductal Carcinoma and DCIS 08/02/2023  Receptor Status: ER(positive-weak staining), PR (negative), Her2-neu (negative), Ki-(50%)   Surgeon and surgical plan, if any:  Dr. Luisa Hart -Discussed left breast lumpectomy utilizing a bracketed approach. Discussed sentinel lymph node mapping as well.  -Await receptors and refer to medical / radiation oncology but plan for breast conserving surgery.    Medical oncologist, treatment if any:   Dr. Mosetta Putt 08/15/2023  Family History of Breast/Ovarian/Prostate Cancer:   Lymphedema issues, if any:      Pain issues, if any:     SAFETY ISSUES: Prior radiation?  Pacemaker/ICD?  Possible current pregnancy? Hysterectomy Is the patient on methotrexate?   Current Complaints / other details:

## 2023-08-15 ENCOUNTER — Ambulatory Visit
Admission: RE | Admit: 2023-08-15 | Discharge: 2023-08-15 | Disposition: A | Discharge status: Home / Self Care | Payer: No Typology Code available for payment source | Source: Ambulatory Visit | Attending: Radiation Oncology | Admitting: Radiation Oncology

## 2023-08-15 ENCOUNTER — Encounter: Payer: Self-pay | Admitting: Radiation Oncology

## 2023-08-15 ENCOUNTER — Inpatient Hospital Stay: Payer: No Typology Code available for payment source | Attending: Hematology | Admitting: Hematology

## 2023-08-15 ENCOUNTER — Encounter: Payer: Self-pay | Admitting: Hematology

## 2023-08-15 ENCOUNTER — Other Ambulatory Visit: Payer: Medicare HMO

## 2023-08-15 VITALS — BP 141/75 | HR 72 | Temp 98.4°F | Resp 20 | Ht 68.0 in

## 2023-08-15 VITALS — BP 135/74 | HR 75 | Temp 98.3°F | Resp 16 | Ht 68.0 in | Wt 231.6 lb

## 2023-08-15 DIAGNOSIS — F17210 Nicotine dependence, cigarettes, uncomplicated: Secondary | ICD-10-CM | POA: Insufficient documentation

## 2023-08-15 DIAGNOSIS — Z90710 Acquired absence of both cervix and uterus: Secondary | ICD-10-CM | POA: Insufficient documentation

## 2023-08-15 DIAGNOSIS — M1611 Unilateral primary osteoarthritis, right hip: Secondary | ICD-10-CM | POA: Diagnosis not present

## 2023-08-15 DIAGNOSIS — Z1722 Progesterone receptor negative status: Secondary | ICD-10-CM | POA: Diagnosis not present

## 2023-08-15 DIAGNOSIS — Z7984 Long term (current) use of oral hypoglycemic drugs: Secondary | ICD-10-CM | POA: Insufficient documentation

## 2023-08-15 DIAGNOSIS — Z8673 Personal history of transient ischemic attack (TIA), and cerebral infarction without residual deficits: Secondary | ICD-10-CM | POA: Insufficient documentation

## 2023-08-15 DIAGNOSIS — Z170 Estrogen receptor positive status [ER+]: Secondary | ICD-10-CM | POA: Diagnosis not present

## 2023-08-15 DIAGNOSIS — B182 Chronic viral hepatitis C: Secondary | ICD-10-CM | POA: Insufficient documentation

## 2023-08-15 DIAGNOSIS — Z833 Family history of diabetes mellitus: Secondary | ICD-10-CM | POA: Diagnosis not present

## 2023-08-15 DIAGNOSIS — Z8042 Family history of malignant neoplasm of prostate: Secondary | ICD-10-CM | POA: Insufficient documentation

## 2023-08-15 DIAGNOSIS — Z79899 Other long term (current) drug therapy: Secondary | ICD-10-CM | POA: Insufficient documentation

## 2023-08-15 DIAGNOSIS — Z8249 Family history of ischemic heart disease and other diseases of the circulatory system: Secondary | ICD-10-CM | POA: Insufficient documentation

## 2023-08-15 DIAGNOSIS — E669 Obesity, unspecified: Secondary | ICD-10-CM | POA: Insufficient documentation

## 2023-08-15 DIAGNOSIS — Z79624 Long term (current) use of inhibitors of nucleotide synthesis: Secondary | ICD-10-CM | POA: Insufficient documentation

## 2023-08-15 DIAGNOSIS — C50412 Malignant neoplasm of upper-outer quadrant of left female breast: Secondary | ICD-10-CM | POA: Insufficient documentation

## 2023-08-15 DIAGNOSIS — I10 Essential (primary) hypertension: Secondary | ICD-10-CM | POA: Insufficient documentation

## 2023-08-15 DIAGNOSIS — Z21 Asymptomatic human immunodeficiency virus [HIV] infection status: Secondary | ICD-10-CM | POA: Insufficient documentation

## 2023-08-15 DIAGNOSIS — Z881 Allergy status to other antibiotic agents status: Secondary | ICD-10-CM | POA: Insufficient documentation

## 2023-08-15 DIAGNOSIS — Z1732 Human epidermal growth factor receptor 2 negative status: Secondary | ICD-10-CM | POA: Insufficient documentation

## 2023-08-15 DIAGNOSIS — Z7985 Long-term (current) use of injectable non-insulin antidiabetic drugs: Secondary | ICD-10-CM | POA: Insufficient documentation

## 2023-08-15 DIAGNOSIS — L409 Psoriasis, unspecified: Secondary | ICD-10-CM | POA: Diagnosis not present

## 2023-08-15 DIAGNOSIS — B20 Human immunodeficiency virus [HIV] disease: Secondary | ICD-10-CM | POA: Diagnosis not present

## 2023-08-15 DIAGNOSIS — D0512 Intraductal carcinoma in situ of left breast: Secondary | ICD-10-CM | POA: Diagnosis not present

## 2023-08-15 DIAGNOSIS — F419 Anxiety disorder, unspecified: Secondary | ICD-10-CM | POA: Diagnosis not present

## 2023-08-15 DIAGNOSIS — K219 Gastro-esophageal reflux disease without esophagitis: Secondary | ICD-10-CM | POA: Insufficient documentation

## 2023-08-15 DIAGNOSIS — M1990 Unspecified osteoarthritis, unspecified site: Secondary | ICD-10-CM | POA: Insufficient documentation

## 2023-08-15 DIAGNOSIS — E119 Type 2 diabetes mellitus without complications: Secondary | ICD-10-CM | POA: Insufficient documentation

## 2023-08-15 DIAGNOSIS — C50212 Malignant neoplasm of upper-inner quadrant of left female breast: Secondary | ICD-10-CM | POA: Diagnosis not present

## 2023-08-15 LAB — COMPREHENSIVE METABOLIC PANEL
ALT: 22 U/L (ref 0–44)
AST: 31 U/L (ref 15–41)
Albumin: 3.9 g/dL (ref 3.5–5.0)
Alkaline Phosphatase: 63 U/L (ref 38–126)
Anion gap: 5 (ref 5–15)
BUN: 7 mg/dL — ABNORMAL LOW (ref 8–23)
CO2: 27 mmol/L (ref 22–32)
Calcium: 9.6 mg/dL (ref 8.9–10.3)
Chloride: 109 mmol/L (ref 98–111)
Creatinine, Ser: 0.7 mg/dL (ref 0.44–1.00)
GFR, Estimated: >60 mL/min (ref >60)
Glucose, Bld: 108 mg/dL — ABNORMAL HIGH (ref 70–99)
Potassium: 3.4 mmol/L — ABNORMAL LOW (ref 3.5–5.1)
Sodium: 141 mmol/L (ref 135–145)
Total Bilirubin: 0.5 mg/dL (ref 0.3–1.2)
Total Protein: 9.2 g/dL — ABNORMAL HIGH (ref 6.5–8.1)

## 2023-08-15 LAB — CBC WITH DIFFERENTIAL/PLATELET
Abs Immature Granulocytes: 0.01 10*3/uL (ref 0.00–0.07)
Basophils Absolute: 0 10*3/uL (ref 0.0–0.1)
Basophils Relative: 1 %
Eosinophils Absolute: 0.1 10*3/uL (ref 0.0–0.5)
Eosinophils Relative: 2 %
HCT: 41.8 % (ref 36.0–46.0)
Hemoglobin: 14.5 g/dL (ref 12.0–15.0)
Immature Granulocytes: 0 %
Lymphocytes Relative: 37 %
Lymphs Abs: 1.8 10*3/uL (ref 0.7–4.0)
MCH: 30.5 pg (ref 26.0–34.0)
MCHC: 34.7 g/dL (ref 30.0–36.0)
MCV: 87.8 fL (ref 80.0–100.0)
Monocytes Absolute: 0.6 10*3/uL (ref 0.1–1.0)
Monocytes Relative: 11 %
Neutro Abs: 2.4 10*3/uL (ref 1.7–7.7)
Neutrophils Relative %: 49 %
Platelets: 162 10*3/uL (ref 150–400)
RBC: 4.76 MIL/uL (ref 3.87–5.11)
RDW: 13.7 % (ref 11.5–15.5)
WBC: 5 10*3/uL (ref 4.0–10.5)
nRBC: 0 % (ref 0.0–0.2)

## 2023-08-17 NOTE — Progress Notes (Signed)
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 history of HIV as well as hepatitis C. Nodes are consistent with reactive nodes of HIV. RECOMMENDATION: 1. Stereotactic core needle biopsy x2 of the left breast calcifications targeting the anterior-posterior extents. I have discussed the findings and recommendations with the patient. If applicable, a reminder letter will be sent to the patient regarding the next appointment. BI-RADS CATEGORY  5: Highly suggestive of malignancy. Electronically Signed   By: Amie Portland M.D.   On: 07/23/2023 15:32       IMPRESSION/PLAN: 1. Stage IB, [id number], grade 3, functionally triple negative invasive ductal carcinoma of the left breast with associated DCIS. Dr. Mitzi Hansen discusses the pathology findings and reviews the nature of early stage breast disease. The patient is interested in lumpectomy with sentinel node biopsy. Dr. Mosetta Putt will see the patient later today and we anticipate she will likely recommend systemic therapy with chemo following her surgery. Dr. Mitzi Hansen discusses the recommendation of external radiotherapy to the breast  to reduce risks of local recurrence. We discussed the risks, benefits, short, and long  term effects of radiotherapy, as well as the curative intent, and the patient is interested in proceeding. Dr. Mitzi Hansen discusses the delivery and logistics of radiotherapy and anticipates a course of 4 or up to 6 1/2 weeks of radiotherapy to the left breast with deep inspiration breath hold technique. We will see her back a few weeks after surgery to discuss the simulation process and anticipate we starting radiotherapy about 4-6 weeks after surgery.  2. Possible genetic predisposition to malignancy. The patient is a candidate  for genetic testing given her personal and family history. She will consider this option.    In a visit lasting 60 minutes, greater than 50% of the time was spent face to face reviewing her case, as well as in preparation of, discussing, and coordinating the patient's care.  The above documentation reflects my direct findings during this shared patient visit. Please see the separate note by Dr. Mitzi Hansen on this date for the remainder of the patient's plan of care.    Osker Mason, [facility]    **Disclaimer: This note was dictated with voice recognition software. Similar sounding words can inadvertently be transcribed and this note may contain transcription errors which may not have been corrected upon publication of note.**

## 2023-08-18 ENCOUNTER — Encounter: Payer: Self-pay | Admitting: Hematology
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
## 2023-08-18 NOTE — Progress Notes (Signed)
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 Stereotactic core needle biopsy x2 of the left breast calcifications targeting the anterior-posterior extents. I have discussed the findings and recommendations with the patient. If applicable, a reminder letter will be sent to the patient regarding the next appointment. BI-RADS CATEGORY  5: Highly suggestive of malignancy. Electronically Signed   By: Amie Portland M.D.   On: 07/23/2023 15:32   [place] LIMITED ULTRASOUND INCLUDING AXILLA RIGHT BREAST  Result Date: 07/23/2023 CLINICAL DATA:  Recall from screening for left breast calcifications and a possible right breast mass. EXAM: DIGITAL DIAGNOSTIC UNILATERAL LEFT MAMMOGRAM WITH CAD; ULTRASOUND RIGHT BREAST LIMITED; [place] AXILLARY LEFT TECHNIQUE: Left digital diagnostic mammography was performed. ; Targeted ultrasound examination of the right breast was performed; Targeted ultrasound examination of the left axilla was performed. COMPARISON:  Previous exam(s). ACR Breast Density Category b: There are scattered  areas of fibroglandular density. FINDINGS: Left breast: There are numerous pleomorphic calcifications that extend throughout the upper outer to lateral left breast, some extending medial to midline, spanning 10 x 8 x 10 cm, with no associated mass or distortion. These calcifications are new when compared to the  most recent exam prior to the current screening study, 10/28/2015. On physical exam, there is a small scar a previous boil along the medial margin of the right breast. Targeted right breast ultrasound is performed, showing a hypoechoic lesion within the skin of the right breast at 3 o'clock, 15 cm the nipple, measuring 1.1 x 0.4 x 0.8 cm. This is consistent with the residua of an inclusion cyst or boil, and is consistent in size and location to the mammographic mass. Targeted bilateral axillary ultrasound is performed, showing multiple prominent lymph nodes with smooth and mild cortical thickening, up to 5 mm. There is no eccentric cortical thickening. On lymph nodes maintain normal hilar fat and normal overall morphology. Findings are symmetric between the left and right axilla. IMPRESSION: 1. Highly suspicious pleomorphic left breast calcifications spanning 10 cm centered on the upper outer quadrant, with no associated mass or distortion. 2. Benign skin lesion in the far medial right breast corresponds to the mass noted on the current screening exam. 3. Bilateral and symmetric prominent axillary lymph nodes. Patient has a history of HIV as well as hepatitis C. Nodes are consistent with reactive nodes of HIV. RECOMMENDATION: 1. Stereotactic core needle biopsy x2 of the left breast calcifications targeting the anterior-posterior extents. I have discussed the findings and recommendations with the patient. If applicable, a reminder letter will be sent to the patient regarding the next appointment. BI-RADS CATEGORY  5: Highly suggestive of malignancy. Electronically Signed   By: Amie Portland M.D.   On:  07/23/2023 15:32    ASSESSMENT & PLAN:  62 year old female with past medical history of diabetes, anxiety, hepatitis C, HIV, head trauma, high blood pressure, arthritis, psoriasis, and a stroke, and a left paralysis    Malignant neoplasm of upper-outer quadrant of left breast, ER weakly positive, PR negative, HER2 negative, [id number], with extensive DCIS, high-grade Newly diagnosed with DCIS and small area of invasive cancer (2mm). ER 45% weak positive, PR negative, HER2 negative, and high KI67. Stage 1B based on current information. Surgery planned with Dr. Wilford Sports.  Patient is a candidate for breast conservation. -I discussed the aggressive and biology of her tumor (55% triple negative), but it looks like this very early stage cancer. -Post-surgery, assess size of invasive tumor to determine need for chemotherapy. -Will repeat the ER/PR her surgical sample.  If ER positivity is similar, may obtain Oncotype, patient is not a great candidate for chemotherapy due to her multiple comorbidities. -Plan for radiation therapy post-surgery. -Initiate anti-estrogen therapy (Anastrozole or Letrozole) after radiation.  Diabetes Managed with [place] and Ozempic. -Continue current regimen.  Hypertension Managed with Omicetin, Amlodipine, and Hydrochlorothiazide. -Continue current regimen.  HIV Managed with [place]. -Continue current regimen.  Anxiety Managed with Lexapro, Wellbutrin, and Crystal. -Continue current regimen.  Acid Reflux Managed with Prozac. -Continue current regimen.  Arthritis and Psoriasis Noted in medical history, no current treatment discussed.  Stroke Occurred approximately three years ago, no current treatment discussed.  Paralysis -Secondary to illness during her childhood  Plan -Lab today for CBC and CMP -Patient will proceed to left lumpectomy and sentinel lymph node biopsy by Dr. Norm Salt in near future -Will repeat ER/PR her surgical sample if invasive  carcinoma is more than 5 mm -Will determine if patient needs Oncotype on her surgical sample depending on the size of the tumor -I will see her after adjuvant radiation, or sooner if she needs chemotherapy.      Orders Placed This Encounter  Procedures   CBC with  Differential/Platelet    Standing Status:   Standing    Number of Occurrences:   50    Standing Expiration Date:   08/14/2024   Comprehensive metabolic panel    Standing Status:   Standing    Number of Occurrences:   50    Standing Expiration Date:   08/14/2024    All questions were answered. The patient knows to call the clinic with any problems, questions or concerns. I spent 45 minutes counseling the patient face to face. The total time spent in the appointment was 50 minutes and more than 50% was on counseling.     Malachy Mood, MD 08/15/2023

## 2023-08-20 ENCOUNTER — Encounter: Payer: Self-pay | Admitting: *Deleted

## 2023-08-20 ENCOUNTER — Ambulatory Visit: Payer: No Typology Code available for payment source | Attending: Cardiovascular Disease

## 2023-08-20 DIAGNOSIS — Z01810 Encounter for preprocedural cardiovascular examination: Secondary | ICD-10-CM

## 2023-08-20 DIAGNOSIS — Z01818 Encounter for other preprocedural examination: Secondary | ICD-10-CM

## 2023-08-22 ENCOUNTER — Other Ambulatory Visit: Payer: Self-pay | Admitting: Surgery

## 2023-08-22 ENCOUNTER — Encounter: Payer: Self-pay | Admitting: *Deleted

## 2023-08-22 DIAGNOSIS — C50412 Malignant neoplasm of upper-outer quadrant of left female breast: Secondary | ICD-10-CM

## 2023-08-28 NOTE — Progress Notes (Signed)
This nurse called and spoke to [facility] for [facility] regarding the need for patient to be moved to main due to wheelchair bound status and BMI. Lilyan Punt RN aware as well.

## 2023-08-30 NOTE — Pre-Procedure Instructions (Signed)
Surgical Instructions   Your procedure is scheduled on Wednesday, October 23rd. Report to [facility] Main Entrance "A" at 12:45 P.M., then check in with the Admitting office. Any questions or running late day of surgery: call [phone]  Questions prior to your surgery date: call [phone], Monday-Friday, 8am-4pm. If you experience any cold or flu symptoms such as cough, fever, chills, shortness of breath, etc. between now and your scheduled surgery, please notify us at the above number.     Remember:  Do not eat after midnight the night before your surgery   You may drink clear liquids until 11:45 AM the morning of your surgery.   Clear liquids allowed are: Water, Non-Citrus Juices (without pulp), Carbonated Beverages, Clear Tea, Black Coffee Only (NO MILK, CREAM OR POWDERED CREAMER of any kind), and Gatorade.    Take these medicines the morning of surgery with A SIP OF WATER  buPROPion (WELLBUTRIN SR)  elvitegravir-cobicistat-emtricitabine-tenofovir (GENVOYA)  escitalopram (LEXAPRO)  omeprazole (PRILOSEC)  rosuvastatin (CRESTOR)    Follow your surgeon's instructions on when to stop Aspirin.  If no instructions were given by your surgeon then you will need to call the office to get those instructions.    One week prior to surgery, STOP taking any Aleve, Naproxen, Ibuprofen, Motrin, Advil, Goody's, BC's, all herbal medications, fish oil, and non-prescription vitamins.  WHAT DO I DO ABOUT MY DIABETES MEDICATION?   Hold FARXIGA 72 hours prior to surgery. Last dose 10/19.  STOP Semaglutide 7 days prior to surgery. Last dose on or before 10/15.        HOW TO MANAGE YOUR DIABETES BEFORE AND AFTER SURGERY  Why is it important to control my blood sugar before and after surgery? Improving blood sugar levels before and after surgery helps healing and can limit problems. A way of improving blood sugar control is eating a healthy diet by:  Eating less sugar and carbohydrates   Increasing activity/exercise  Talking with your doctor about reaching your blood sugar goals High blood sugars (greater than 180 mg/dL) can raise your risk of infections and slow your recovery, so you will need to focus on controlling your diabetes during the weeks before surgery. Make sure that the doctor who takes care of your diabetes knows about your planned surgery including the date and location.  How do I manage my blood sugar before surgery? Check your blood sugar at least 4 times a day, starting 2 days before surgery, to make sure that the level is not too high or low.  Check your blood sugar the morning of your surgery when you wake up and every 2 hours until you get to the Short Stay unit.  If your blood sugar is less than 70 mg/dL, you will need to treat for low blood sugar: Do not take insulin. Treat a low blood sugar (less than 70 mg/dL) with  cup of clear juice (cranberry or apple), 4 glucose tablets, OR glucose gel. Recheck blood sugar in 15 minutes after treatment (to make sure it is greater than 70 mg/dL). If your blood sugar is not greater than 70 mg/dL on recheck, call [phone] for further instructions. Report your blood sugar to the short stay nurse when you get to Short Stay.  If you are admitted to the hospital after surgery: Your blood sugar will be checked by the staff and you will probably be given insulin after surgery (instead of oral diabetes medicines) to make sure you have good blood sugar levels. The goal for  blood sugar control after surgery is 80-180 mg/dL.                     Do NOT Smoke (Tobacco/Vaping) for 24 hours prior to your procedure.  If you use a CPAP at night, you may bring your mask/headgear for your overnight stay.   You will be asked to remove any contacts, glasses, piercing's, hearing aid's, dentures/partials prior to surgery. Please bring cases for these items if needed.    Patients discharged the day of surgery will not be allowed to  drive home, and someone needs to stay with them for 24 hours.  SURGICAL WAITING ROOM VISITATION Patients may have no more than 2 support people in the waiting area - these visitors may rotate.   Pre-op nurse will coordinate an appropriate time for 1 ADULT support person, who may not rotate, to accompany patient in pre-op.  Children under the age of 53 must have an adult with them who is not the patient and must remain in the main waiting area with an adult.  If the patient needs to stay at the hospital during part of their recovery, the visitor guidelines for inpatient rooms apply.  Please refer to the [facility] website for the visitor guidelines for any additional information.   If you received a COVID test during your pre-op visit  it is requested that you wear a mask when out in public, stay away from anyone that may not be feeling well and notify your surgeon if you develop symptoms. If you have been in contact with anyone that has tested positive in the last 10 days please notify you surgeon.      Pre-operative CHG Bathing Instructions   You can play a key role in reducing the risk of infection after surgery. Your skin needs to be as free of germs as possible. You can reduce the number of germs on your skin by washing with CHG (chlorhexidine gluconate) soap before surgery. CHG is an antiseptic soap that kills germs and continues to kill germs even after washing.   DO NOT use if you have an allergy to chlorhexidine/CHG or antibacterial soaps. If your skin becomes reddened or irritated, stop using the CHG and notify one of our RNs at [phone].              TAKE A SHOWER THE NIGHT BEFORE SURGERY AND THE DAY OF SURGERY    Please keep in mind the following:  DO NOT shave, including legs and underarms, 48 hours prior to surgery.   You may shave your face before/day of surgery.  Place clean sheets on your bed the night before surgery Use a clean washcloth (not used since being  washed) for each shower. DO NOT sleep with pet's night before surgery.  CHG Shower Instructions:  Wash your face and private area with normal soap. If you choose to wash your hair, wash first with your normal shampoo.  After you use shampoo/soap, rinse your hair and body thoroughly to remove shampoo/soap residue.  Turn the water OFF and apply half the bottle of CHG soap to a CLEAN washcloth.  Apply CHG soap ONLY FROM YOUR NECK DOWN TO YOUR TOES (washing for 3-5 minutes)  DO NOT use CHG soap on face, private areas, open wounds, or sores.  Pay special attention to the area where your surgery is being performed.  If you are having back surgery, having someone wash your back for you may be helpful. Wait  2 minutes after CHG soap is applied, then you may rinse off the CHG soap.  Pat dry with a clean towel  Put on clean pajamas    Additional instructions for the day of surgery: DO NOT APPLY any lotions, deodorants, cologne, or perfumes.   Do not wear jewelry or makeup Do not wear nail polish, gel polish, artificial nails, or any other type of covering on natural nails (fingers and toes) Do not bring valuables to the hospital. [facility] is not responsible for valuables/personal belongings. Put on clean/comfortable clothes.  Please brush your teeth.  Ask your nurse before applying any prescription medications to the skin.

## 2023-09-02 ENCOUNTER — Other Ambulatory Visit: Payer: Self-pay

## 2023-09-02 ENCOUNTER — Encounter (HOSPITAL_COMMUNITY): Payer: Self-pay

## 2023-09-02 ENCOUNTER — Encounter (HOSPITAL_COMMUNITY)
Admission: RE | Admit: 2023-09-02 | Discharge: 2023-09-02 | Disposition: A | Discharge status: Home / Self Care | Payer: No Typology Code available for payment source | Source: Ambulatory Visit | Attending: Surgery | Admitting: Surgery

## 2023-09-02 VITALS — BP 134/70 | HR 80 | Temp 98.3°F | Resp 17 | Ht 66.0 in | Wt 229.5 lb

## 2023-09-02 DIAGNOSIS — E119 Type 2 diabetes mellitus without complications: Secondary | ICD-10-CM | POA: Insufficient documentation

## 2023-09-02 DIAGNOSIS — Z993 Dependence on wheelchair: Secondary | ICD-10-CM | POA: Diagnosis not present

## 2023-09-02 DIAGNOSIS — I10 Essential (primary) hypertension: Secondary | ICD-10-CM | POA: Diagnosis not present

## 2023-09-02 DIAGNOSIS — Z8619 Personal history of other infectious and parasitic diseases: Secondary | ICD-10-CM | POA: Diagnosis not present

## 2023-09-02 DIAGNOSIS — E039 Hypothyroidism, unspecified: Secondary | ICD-10-CM | POA: Insufficient documentation

## 2023-09-02 DIAGNOSIS — F17210 Nicotine dependence, cigarettes, uncomplicated: Secondary | ICD-10-CM | POA: Insufficient documentation

## 2023-09-02 DIAGNOSIS — Z21 Asymptomatic human immunodeficiency virus [HIV] infection status: Secondary | ICD-10-CM | POA: Insufficient documentation

## 2023-09-02 DIAGNOSIS — E785 Hyperlipidemia, unspecified: Secondary | ICD-10-CM | POA: Diagnosis not present

## 2023-09-02 DIAGNOSIS — Z01812 Encounter for preprocedural laboratory examination: Secondary | ICD-10-CM | POA: Insufficient documentation

## 2023-09-02 DIAGNOSIS — Z8673 Personal history of transient ischemic attack (TIA), and cerebral infarction without residual deficits: Secondary | ICD-10-CM | POA: Insufficient documentation

## 2023-09-02 LAB — COMPREHENSIVE METABOLIC PANEL
ALT: 27 U/L (ref 0–44)
AST: 33 U/L (ref 15–41)
Albumin: 3.2 g/dL — ABNORMAL LOW (ref 3.5–5.0)
Alkaline Phosphatase: 54 U/L (ref 38–126)
Anion gap: 6 (ref 5–15)
BUN: 8 mg/dL (ref 8–23)
CO2: 22 mmol/L (ref 22–32)
Calcium: 8.8 mg/dL — ABNORMAL LOW (ref 8.9–10.3)
Chloride: 109 mmol/L (ref 98–111)
Creatinine, Ser: 0.79 mg/dL (ref 0.44–1.00)
GFR, Estimated: >60 mL/min (ref >60)
Glucose, Bld: 98 mg/dL (ref 70–99)
Potassium: 3.5 mmol/L (ref 3.5–5.1)
Sodium: 137 mmol/L (ref 135–145)
Total Bilirubin: 0.7 mg/dL (ref 0.3–1.2)
Total Protein: 8.3 g/dL — ABNORMAL HIGH (ref 6.5–8.1)

## 2023-09-02 LAB — CBC
HCT: 41 % (ref 36.0–46.0)
Hemoglobin: 13.4 g/dL (ref 12.0–15.0)
MCH: 29 pg (ref 26.0–34.0)
MCHC: 32.7 g/dL (ref 30.0–36.0)
MCV: 88.7 fL (ref 80.0–100.0)
Platelets: 151 10*3/uL (ref 150–400)
RBC: 4.62 MIL/uL (ref 3.87–5.11)
RDW: 13.8 % (ref 11.5–15.5)
WBC: 5.3 10*3/uL (ref 4.0–10.5)
nRBC: 0 % (ref 0.0–0.2)

## 2023-09-02 LAB — GLUCOSE, CAPILLARY: Glucose-Capillary: 111 mg/dL — ABNORMAL HIGH (ref 70–99)

## 2023-09-02 LAB — HEMOGLOBIN A1C
Hgb A1c MFr Bld: 6.1 % — ABNORMAL HIGH (ref 4.8–5.6)
Mean Plasma Glucose: 128.37 mg/dL

## 2023-09-02 NOTE — Progress Notes (Signed)
PCP - [name], MD  Cardiologist - Dr. Dietrich Pates   PPM/ICD - denies Device Orders - n/a Rep Notified - n/a  Chest x-ray -  EKG - 04-12-23 Stress Test -  ECHO - 05-14-23 Cardiac Cath -   Sleep Study - denies CPAP -   FARXIGA LAST dose 09-02-23 Fasting Blood Sugar - 98-100 Checks Blood Sugar BID times a day  Last dose of GLP1 agonist-  Semaglutide  GLP1 instructions: LAST DOSE 09-01-23  Blood Thinner Instructions:denies Aspirin Instructions: instructed to call surgeon for further instructions  ERAS Protcol - clear liquids until 11:45 am. PRE-SURGERY Ensure or G2- n/a  COVID TEST- n/a   Anesthesia review: yes, hx of CVA, HTN, DM, HepC.,  Patient denies shortness of breath, fever, cough and chest pain at PAT appointment   All instructions explained to the patient, with a verbal understanding of the material. Patient agrees to go over the instructions while at home for a better understanding. Patient also instructed to self quarantine after being tested for COVID-19. The opportunity to ask questions was provided.

## 2023-09-03 ENCOUNTER — Ambulatory Visit
Admission: RE | Admit: 2023-09-03 | Discharge: 2023-09-03 | Disposition: A | Discharge status: Home / Self Care | Payer: Medicare HMO | Source: Ambulatory Visit | Attending: Surgery | Admitting: Surgery

## 2023-09-03 DIAGNOSIS — D0512 Intraductal carcinoma in situ of left breast: Secondary | ICD-10-CM | POA: Diagnosis not present

## 2023-09-03 DIAGNOSIS — C50412 Malignant neoplasm of upper-outer quadrant of left female breast: Secondary | ICD-10-CM

## 2023-09-03 DIAGNOSIS — C50912 Malignant neoplasm of unspecified site of left female breast: Secondary | ICD-10-CM | POA: Diagnosis not present

## 2023-09-03 HISTORY — PX: BREAST BIOPSY: SHX20

## 2023-09-03 NOTE — Progress Notes (Signed)
Anesthesia Chart Review:  62 year old female follows with cardiology for history of CVA (2022), HTN, HLD.  Most recent echo July 24 showed EF 70 to 75%, normal RV function, no significant valvular abnormalities..  Last seen by Joni Reining, NP on 10 8 for preop evaluation.  Per note, "According to the Revised Cardiac Risk Index (RCRI), her Perioperative Risk of Major Cardiac Event is (%): 0.9. Her Functional Capacity in METs is: 3.08 according to the Duke Activity Status Index (DASI). Activity level is severely limited due to CVA and is wheelchair bound.  The patient was advised that if she develops new symptoms prior to surgery to contact our office to arrange for a follow-up visit, and she verbalized understanding. Therefore, based on ACC/AHA guidelines, patient would be at acceptable risk for the planned procedure without further cardiovascular testing. I will route this recommendation to the requesting party via Epic fax function."  Other pertinent history includes HIV maintained on Genvoya, chronic hepatitis C s/p (HCV not detected on labs 05/28/2023), GERD, tobacco abuse, hypothyroid, non-insulin-dependent DM2.  Patient reports last dose of semaglutide 09/01/2023  Preop labs reviewed, unremarkable.  A1c 6.1.  EKG 04/12/2023: NSR.  Rate 70.  TTE 05/14/23: 1. Small intracavitary gradient related to dynamic function. Left  ventricular ejection fraction, by estimation, is 70 to 75%. Left  ventricular ejection fraction by 3D volume is 73 %. The left ventricle has  hyperdynamic function. The left ventricle has  no regional wall motion abnormalities. Left ventricular diastolic  parameters were normal.   2. Right ventricular systolic function is normal. The right ventricular  size is normal.   3. The mitral valve is normal in structure. No evidence of mitral valve  regurgitation. No evidence of mitral stenosis.   4. The aortic valve is tricuspid. Aortic valve regurgitation is not  visualized.    5. The inferior vena cava is normal in size with greater than 50%  respiratory variability, suggesting right atrial pressure of 3 mmHg.   Comparison(s): LV is more vigorous from prior.    Zannie Cove [facility] Short Stay Center/Anesthesiology Phone [phone] 09/03/2023 10:07 AM

## 2023-09-03 NOTE — Anesthesia Preprocedure Evaluation (Signed)
Anesthesia Evaluation  Patient identified by MRN, date of birth, ID band Patient awake    Reviewed: Allergy & Precautions, NPO status , Patient's Chart, lab work & pertinent test results  History of Anesthesia Complications Negative for: history of anesthetic complications  Airway Mallampati: II  TM Distance: >3 FB Neck ROM: Full    Dental  (+) Missing, Poor Dentition,    Pulmonary Current Smoker and Patient abstained from smoking.   Pulmonary exam normal        Cardiovascular hypertension, Pt. on medications Normal cardiovascular exam     Neuro/Psych   Anxiety Depression    CVA (right side weak, uses walker)    GI/Hepatic ,GERD  ,,(+) Hepatitis -, C  Endo/Other  diabetes    Renal/GU negative Renal ROS  negative genitourinary   Musculoskeletal  (+) Arthritis ,    Abdominal   Peds  Hematology  (+) HIV  Anesthesia Other Findings Left breast ca  Reproductive/Obstetrics                             Anesthesia Physical Anesthesia Plan  ASA: 3  Anesthesia Plan: General   Post-op Pain Management: Tylenol PO (pre-op)*   Induction: Intravenous and Rapid sequence  PONV Risk Score and Plan: 2 and Ondansetron, Dexamethasone, Treatment may vary due to age or medical condition and Midazolam  Airway Management Planned: Oral ETT  Additional Equipment: None  Intra-op Plan:   Post-operative Plan: Extubation in OR  Informed Consent: I have reviewed the patients History and Physical, chart, labs and discussed the procedure including the risks, benefits and alternatives for the proposed anesthesia with the patient or authorized representative who has indicated his/her understanding and acceptance.     Dental advisory given  Plan Discussed with: CRNA  Anesthesia Plan Comments: (PAT note by Antionette Poles, PA-C: 62 year old female follows with cardiology for history of CVA (2022), HTN, HLD.   Most recent echo July 24 showed EF 70 to 75%, normal RV function, no significant valvular abnormalities..  Last seen by Joni Reining, NP on 10 8 for preop evaluation.  Per note, "According to the Revised Cardiac Risk Index (RCRI), her Perioperative Risk of Major Cardiac Event is (%): 0.9. Her Functional Capacity in METs is: 3.08 according to the Duke Activity Status Index (DASI). Activity level is severely limited due to CVA and is wheelchair bound.  The patient was advised that if she develops new symptoms prior to surgery to contact our office to arrange for a follow-up visit, and she verbalized understanding. Therefore, based on ACC/AHA guidelines, patient would be at acceptable risk for the planned procedure without further cardiovascular testing. I will route this recommendation to the requesting party via Epic fax function."  Other pertinent history includes HIV maintained on Genvoya, chronic hepatitis C s/p (HCV not detected on labs 05/28/2023), GERD, tobacco abuse, hypothyroid, non-insulin-dependent DM2.  Patient reports last dose of semaglutide 09/01/2023  Preop labs reviewed, unremarkable.  A1c 6.1.  EKG 04/12/2023: NSR.  Rate 70.  TTE 05/14/23: 1. Small intracavitary gradient related to dynamic function. Left  ventricular ejection fraction, by estimation, is 70 to 75%. Left  ventricular ejection fraction by 3D volume is 73 %. The left ventricle has  hyperdynamic function. The left ventricle has  no regional wall motion abnormalities. Left ventricular diastolic  parameters were normal.  2. Right ventricular systolic function is normal. The right ventricular  size is normal.  3. The mitral valve is  normal in structure. No evidence of mitral valve  regurgitation. No evidence of mitral stenosis.  4. The aortic valve is tricuspid. Aortic valve regurgitation is not  visualized.  5. The inferior vena cava is normal in size with greater than 50%  respiratory variability, suggesting  right atrial pressure of 3 mmHg.   Comparison(s): LV is more vigorous from prior.    )        Anesthesia Quick Evaluation

## 2023-09-04 ENCOUNTER — Ambulatory Visit
Admission: RE | Admit: 2023-09-04 | Discharge: 2023-09-04 | Disposition: A | Discharge status: Home / Self Care | Payer: Medicare HMO | Source: Ambulatory Visit | Attending: Surgery | Admitting: Surgery

## 2023-09-04 ENCOUNTER — Other Ambulatory Visit: Payer: Self-pay

## 2023-09-04 ENCOUNTER — Ambulatory Visit (HOSPITAL_BASED_OUTPATIENT_CLINIC_OR_DEPARTMENT_OTHER): Payer: Medicare HMO | Admitting: Anesthesiology

## 2023-09-04 ENCOUNTER — Encounter (HOSPITAL_COMMUNITY)
Admission: RE | Disposition: A | Discharge status: Home / Self Care | Payer: Self-pay | Source: Home / Self Care | Attending: Surgery

## 2023-09-04 ENCOUNTER — Encounter (HOSPITAL_COMMUNITY): Payer: Self-pay | Admitting: Surgery

## 2023-09-04 ENCOUNTER — Ambulatory Visit (HOSPITAL_COMMUNITY)
Admission: RE | Admit: 2023-09-04 | Discharge: 2023-09-04 | Disposition: A | Discharge status: Home / Self Care | Payer: Medicare HMO | Attending: Surgery | Admitting: Surgery

## 2023-09-04 ENCOUNTER — Ambulatory Visit (HOSPITAL_COMMUNITY): Payer: Medicare HMO | Admitting: Physician Assistant

## 2023-09-04 DIAGNOSIS — I10 Essential (primary) hypertension: Secondary | ICD-10-CM | POA: Insufficient documentation

## 2023-09-04 DIAGNOSIS — D0512 Intraductal carcinoma in situ of left breast: Secondary | ICD-10-CM | POA: Diagnosis not present

## 2023-09-04 DIAGNOSIS — Z8619 Personal history of other infectious and parasitic diseases: Secondary | ICD-10-CM | POA: Insufficient documentation

## 2023-09-04 DIAGNOSIS — C50412 Malignant neoplasm of upper-outer quadrant of left female breast: Secondary | ICD-10-CM | POA: Diagnosis not present

## 2023-09-04 DIAGNOSIS — Z21 Asymptomatic human immunodeficiency virus [HIV] infection status: Secondary | ICD-10-CM | POA: Insufficient documentation

## 2023-09-04 DIAGNOSIS — Z87891 Personal history of nicotine dependence: Secondary | ICD-10-CM | POA: Insufficient documentation

## 2023-09-04 DIAGNOSIS — C50912 Malignant neoplasm of unspecified site of left female breast: Secondary | ICD-10-CM | POA: Diagnosis not present

## 2023-09-04 DIAGNOSIS — F17210 Nicotine dependence, cigarettes, uncomplicated: Secondary | ICD-10-CM | POA: Diagnosis not present

## 2023-09-04 DIAGNOSIS — Z96643 Presence of artificial hip joint, bilateral: Secondary | ICD-10-CM | POA: Insufficient documentation

## 2023-09-04 DIAGNOSIS — Z7984 Long term (current) use of oral hypoglycemic drugs: Secondary | ICD-10-CM | POA: Diagnosis not present

## 2023-09-04 DIAGNOSIS — Z8249 Family history of ischemic heart disease and other diseases of the circulatory system: Secondary | ICD-10-CM | POA: Diagnosis not present

## 2023-09-04 DIAGNOSIS — Z833 Family history of diabetes mellitus: Secondary | ICD-10-CM | POA: Insufficient documentation

## 2023-09-04 DIAGNOSIS — Z90710 Acquired absence of both cervix and uterus: Secondary | ICD-10-CM | POA: Diagnosis not present

## 2023-09-04 DIAGNOSIS — E119 Type 2 diabetes mellitus without complications: Secondary | ICD-10-CM | POA: Insufficient documentation

## 2023-09-04 DIAGNOSIS — Z170 Estrogen receptor positive status [ER+]: Secondary | ICD-10-CM | POA: Diagnosis not present

## 2023-09-04 HISTORY — PX: BREAST LUMPECTOMY WITH RADIOACTIVE SEED LOCALIZATION: SHX6424

## 2023-09-04 LAB — GLUCOSE, CAPILLARY
Glucose-Capillary: 112 mg/dL — ABNORMAL HIGH (ref 70–99)
Glucose-Capillary: 93 mg/dL (ref 70–99)
Glucose-Capillary: 93 mg/dL (ref 70–99)

## 2023-09-04 SURGERY — BREAST LUMPECTOMY WITH RADIOACTIVE SEED LOCALIZATION
Anesthesia: General | Laterality: Left

## 2023-09-04 MED ORDER — SUCCINYLCHOLINE CHLORIDE 200 MG/10ML IV SOSY
PREFILLED_SYRINGE | INTRAVENOUS | Status: DC | PRN
  Administered 2023-09-04: 100 mg via INTRAVENOUS

## 2023-09-04 MED ORDER — CHLORHEXIDINE GLUCONATE CLOTH 2 % EX PADS: 6.0000 | MEDICATED_PAD | Freq: Once | CUTANEOUS | Status: DC

## 2023-09-04 MED ORDER — 0.9 % SODIUM CHLORIDE (POUR BTL) OPTIME
TOPICAL | Status: DC | PRN
  Administered 2023-09-04: 1000 mL

## 2023-09-04 MED ORDER — OXYCODONE HCL 5 MG PO TABS: 5.0000 mg | ORAL_TABLET | Freq: Four times a day (QID) | ORAL | 0 refills | Status: DC | PRN

## 2023-09-04 MED ORDER — FENTANYL CITRATE (PF) 250 MCG/5ML IJ SOLN
INTRAMUSCULAR | Status: DC | PRN
  Administered 2023-09-04 (×2): 100 ug via INTRAVENOUS
  Administered 2023-09-04: 50 ug via INTRAVENOUS

## 2023-09-04 MED ORDER — ONDANSETRON HCL 4 MG/2ML IJ SOLN
INTRAMUSCULAR | Status: DC | PRN
  Administered 2023-09-04: 4 mg via INTRAVENOUS

## 2023-09-04 MED ORDER — OXYCODONE HCL 5 MG PO TABS: 5.0000 mg | ORAL_TABLET | Freq: Once | ORAL | Status: DC | PRN

## 2023-09-04 MED ORDER — PHENYLEPHRINE 80 MCG/ML (10ML) SYRINGE FOR IV PUSH (FOR BLOOD PRESSURE SUPPORT)
PREFILLED_SYRINGE | INTRAVENOUS | Status: DC | PRN
  Administered 2023-09-04: 120 ug via INTRAVENOUS

## 2023-09-04 MED ORDER — FENTANYL CITRATE (PF) 100 MCG/2ML IJ SOLN: 25.0000 ug | INTRAMUSCULAR | Status: DC | PRN

## 2023-09-04 MED ORDER — BUPIVACAINE-EPINEPHRINE (PF) 0.25% -1:200000 IJ SOLN
INTRAMUSCULAR | Status: AC
  Filled 2023-09-04: qty 30

## 2023-09-04 MED ORDER — INSULIN ASPART 100 UNIT/ML IJ SOLN: 0.0000 [IU] | INTRAMUSCULAR | Status: DC | PRN

## 2023-09-04 MED ORDER — CLINDAMYCIN PHOSPHATE 900 MG/50ML IV SOLN
900.0000 mg | INTRAVENOUS | Status: AC
  Administered 2023-09-04: 900 mg via INTRAVENOUS
  Filled 2023-09-04: qty 50

## 2023-09-04 MED ORDER — MIDAZOLAM HCL 2 MG/2ML IJ SOLN
INTRAMUSCULAR | Status: AC
  Filled 2023-09-04: qty 2

## 2023-09-04 MED ORDER — PROPOFOL 10 MG/ML IV BOLUS
INTRAVENOUS | Status: AC
  Filled 2023-09-04: qty 20

## 2023-09-04 MED ORDER — CHLORHEXIDINE GLUCONATE 0.12 % MT SOLN
15.0000 mL | Freq: Once | OROMUCOSAL | Status: AC
  Administered 2023-09-04: 15 mL via OROMUCOSAL
  Filled 2023-09-04: qty 15

## 2023-09-04 MED ORDER — LACTATED RINGERS IV SOLN
INTRAVENOUS | Status: DC | PRN
Start: 2023-09-04 — End: 2023-09-04

## 2023-09-04 MED ORDER — LIDOCAINE 2% (20 MG/ML) 5 ML SYRINGE
INTRAMUSCULAR | Status: DC | PRN
  Administered 2023-09-04: 80 mg via INTRAVENOUS

## 2023-09-04 MED ORDER — OXYCODONE HCL 5 MG/5ML PO SOLN: 5.0000 mg | Freq: Once | ORAL | Status: DC | PRN

## 2023-09-04 MED ORDER — BUPIVACAINE-EPINEPHRINE 0.25% -1:200000 IJ SOLN
INTRAMUSCULAR | Status: DC | PRN
  Administered 2023-09-04: 30 mL

## 2023-09-04 MED ORDER — ORAL CARE MOUTH RINSE: 15.0000 mL | Freq: Once | OROMUCOSAL | Status: AC

## 2023-09-04 MED ORDER — DROPERIDOL 2.5 MG/ML IJ SOLN: 0.6250 mg | Freq: Once | INTRAMUSCULAR | Status: DC | PRN

## 2023-09-04 MED ORDER — MIDAZOLAM HCL 2 MG/2ML IJ SOLN
INTRAMUSCULAR | Status: DC | PRN
  Administered 2023-09-04: 2 mg via INTRAVENOUS

## 2023-09-04 MED ORDER — ACETAMINOPHEN 500 MG PO TABS: 1000.0000 mg | ORAL_TABLET | Freq: Once | ORAL | Status: AC

## 2023-09-04 MED ORDER — PHENYLEPHRINE HCL-NACL 20-0.9 MG/250ML-% IV SOLN
INTRAVENOUS | Status: DC | PRN
  Administered 2023-09-04: 40 ug/min via INTRAVENOUS

## 2023-09-04 MED ORDER — FENTANYL CITRATE (PF) 250 MCG/5ML IJ SOLN
INTRAMUSCULAR | Status: AC
  Filled 2023-09-04: qty 5

## 2023-09-04 MED ORDER — IBUPROFEN 800 MG PO TABS: 800.0000 mg | ORAL_TABLET | Freq: Three times a day (TID) | ORAL | 0 refills | Status: DC | PRN

## 2023-09-04 MED ORDER — ACETAMINOPHEN 500 MG PO TABS
ORAL_TABLET | ORAL | Status: AC
  Administered 2023-09-04: 1000 mg via ORAL
  Filled 2023-09-04: qty 2

## 2023-09-04 MED ORDER — PROPOFOL 1000 MG/100ML IV EMUL
INTRAVENOUS | Status: AC
  Filled 2023-09-04: qty 100

## 2023-09-04 MED ORDER — PROPOFOL 10 MG/ML IV BOLUS
INTRAVENOUS | Status: DC | PRN
  Administered 2023-09-04: 150 mg via INTRAVENOUS
  Administered 2023-09-04: 30 mg via INTRAVENOUS
  Administered 2023-09-04: 20 mg via INTRAVENOUS
  Administered 2023-09-04: 125 ug/kg/min via INTRAVENOUS

## 2023-09-04 SURGICAL SUPPLY — 42 items
ADH SKN CLS APL DERMABOND .7 (GAUZE/BANDAGES/DRESSINGS) ×1
APL PRP STRL LF DISP 70% ISPRP (MISCELLANEOUS) ×1
APPLIER CLIP 9.375 MED OPEN (MISCELLANEOUS)
APR CLP MED 9.3 20 MLT OPN (MISCELLANEOUS)
BAG COUNTER SPONGE SURGICOUNT (BAG) ×1 IMPLANT
BAG SPNG CNTER NS LX DISP (BAG) ×1
BINDER BREAST LRG (GAUZE/BANDAGES/DRESSINGS) IMPLANT
BINDER BREAST XLRG (GAUZE/BANDAGES/DRESSINGS) IMPLANT
BINDER BREAST XXLRG (GAUZE/BANDAGES/DRESSINGS) IMPLANT
CANISTER SUCT 3000ML PPV (MISCELLANEOUS) IMPLANT
CHLORAPREP W/TINT 26 (MISCELLANEOUS) ×1 IMPLANT
CLIP APPLIE 9.375 MED OPEN (MISCELLANEOUS) IMPLANT
COVER PROBE W GEL 5X96 (DRAPES) ×1 IMPLANT
COVER SURGICAL LIGHT HANDLE (MISCELLANEOUS) ×1 IMPLANT
DERMABOND ADVANCED .7 DNX12 (GAUZE/BANDAGES/DRESSINGS) ×1 IMPLANT
DEVICE DUBIN SPECIMEN MAMMOGRA (MISCELLANEOUS) ×1 IMPLANT
DRAPE CHEST BREAST 15X10 FENES (DRAPES) ×1 IMPLANT
ELECT CAUTERY BLADE 6.4 (BLADE) ×1 IMPLANT
ELECT REM PT RETURN 9FT ADLT (ELECTROSURGICAL) ×1
ELECTRODE REM PT RTRN 9FT ADLT (ELECTROSURGICAL) ×1 IMPLANT
GAUZE PAD ABD 8X10 STRL (GAUZE/BANDAGES/DRESSINGS) ×1 IMPLANT
GAUZE SPONGE 4X4 12PLY STRL (GAUZE/BANDAGES/DRESSINGS) IMPLANT
GLOVE BIO SURGEON STRL SZ8 (GLOVE) ×1 IMPLANT
GLOVE BIOGEL PI IND STRL 8 (GLOVE) ×1 IMPLANT
GOWN STRL REUS W/ TWL LRG LVL3 (GOWN DISPOSABLE) ×1 IMPLANT
GOWN STRL REUS W/ TWL XL LVL3 (GOWN DISPOSABLE) ×1 IMPLANT
GOWN STRL REUS W/TWL LRG LVL3 (GOWN DISPOSABLE) ×1
GOWN STRL REUS W/TWL XL LVL3 (GOWN DISPOSABLE) ×1
KIT BASIN OR (CUSTOM PROCEDURE TRAY) ×1 IMPLANT
KIT MARKER MARGIN INK (KITS) ×1 IMPLANT
LIGHT WAVEGUIDE WIDE FLAT (MISCELLANEOUS) IMPLANT
NDL HYPO 25GX1X1/2 BEV (NEEDLE) ×1 IMPLANT
NEEDLE HYPO 25GX1X1/2 BEV (NEEDLE) ×1
NS IRRIG 1000ML POUR BTL (IV SOLUTION) IMPLANT
PACK GENERAL/GYN (CUSTOM PROCEDURE TRAY) ×1 IMPLANT
SUT MNCRL AB 4-0 PS2 18 (SUTURE) ×1 IMPLANT
SUT SILK 2 0 SH (SUTURE) IMPLANT
SUT VIC AB 2-0 SH 27 (SUTURE)
SUT VIC AB 2-0 SH 27XBRD (SUTURE) IMPLANT
SUT VIC AB 3-0 SH 8-18 (SUTURE) ×1 IMPLANT
SYR CONTROL 10ML LL (SYRINGE) ×1 IMPLANT
TOWEL GREEN STERILE FF (TOWEL DISPOSABLE) IMPLANT

## 2023-09-04 NOTE — Transfer of Care (Signed)
Immediate Anesthesia Transfer of Care Note  Patient: RUKIYA METTE  Procedure(s) Performed: LEFT BREAST BRACKETED SEED LOCALIZED LUMPECTOMY (Left)  Patient Location: PACU  Anesthesia Type:General  Level of Consciousness: awake, oriented, and drowsy  Airway & Oxygen Therapy: Patient Spontanous Breathing  Post-op Assessment: Report given to RN and Post -op Vital signs reviewed and stable  Post vital signs: Reviewed and stable  Last Vitals:  Vitals Value Taken Time  BP 132/67 09/04/23 1657  Temp    Pulse 66 09/04/23 1700  Resp 14 09/04/23 1700  SpO2 94 % 09/04/23 1700  Vitals shown include unfiled device data.  Last Pain:  Vitals:   09/04/23 1243  PainSc: 0-No pain      Patients Stated Pain Goal: 0 (09/04/23 1243)  Complications: No notable events documented.

## 2023-09-04 NOTE — H&P (Signed)
History of Present Illness: Nicole [name] is a 62 y.o. female who is seen today as an office consultation for evaluation of NEW BREAST CANCER  Patient presents for evaluation after undergoing workup which included screen detected left breast microcalcifications. She had a cluster of microcalcifications measuring up to 10 cm left breast upper outer quadrant. 2 core biopsies were done. 1 showed invasive ductal carcinoma the other showed DCIS. Receptors are pending. Denies any history of breast pain breast mass or nipple discharge. She does have some physical limitations secondary to stroke. History of HIV and hep C. These appear stable.  Review of Systems: A complete review of systems was obtained from the patient. I have reviewed this information and discussed as appropriate with the patient. See HPI as well for other ROS.    Medical History: Past Medical History:  Diagnosis Date  History of stroke  Hypertension   There is no problem list on file for this patient.  Past Surgical History:  Procedure Laterality Date  HYSTERECTOMY  JOINT REPLACEMENT Bilateral  Hip    No Known Allergies  Current Outpatient Medications on File Prior to Visit  Medication Sig Dispense Refill  ACCU-CHEK GUIDE TEST STRIPS test strip CHECK BLOOD SUGAR ONE TIME DAILY  aspirin 81 MG EC tablet Take 81 mg by mouth once daily  buPROPion (WELLBUTRIN SR) 150 MG SR tablet Take 150 mg by mouth once daily  escitalopram oxalate (LEXAPRO) 20 MG tablet TAKE HALF TABLET A DAY FOR 1 WEEK, THEN START TAKING 1 FULL TABLET BY MOUTH DAILY  FARXIGA 10 mg tablet Take 10 mg by mouth every morning before breakfast (0630)  GENVOYA 150-150-200-10 mg tablet Take 1 tablet by mouth daily with breakfast  olmesartan-amLODIPine-hydroCHLOROthiazide (TRIBENZOR) 40-5-12.5 mg tablet Take 1 tablet by mouth once daily  omeprazole (PRILOSEC) 20 MG DR capsule Take 20 mg by mouth once daily  rosuvastatin (CRESTOR) 20 MG tablet Take 20 mg  by mouth once daily   No current facility-administered medications on file prior to visit.   Family History  Problem Relation Age of Onset  High blood pressure (Hypertension) Sister  Diabetes Sister  High blood pressure (Hypertension) Brother  Diabetes Brother    Social History   Tobacco Use  Smoking Status Former  Types: Cigarettes  Smokeless Tobacco Never    Social History   Socioeconomic History  Marital status: Single  Tobacco Use  Smoking status: Former  Types: Cigarettes  Smokeless tobacco: Never  Substance and Sexual Activity  Alcohol use: Never  Drug use: Never   Social Determinants of Health   Financial Resource Strain: Low Risk (02/12/2023)  Received [facility]  Overall Financial Resource Strain (CARDIA)  Difficulty of Paying Living Expenses: Not very hard  Food Insecurity: No Food Insecurity (02/12/2023)  Received from [facility]  Hunger Vital Sign  Worried About Running Out of Food in the Last Year: Never true  Ran Out of Food in the Last Year: Never true  Transportation Needs: No Transportation Needs (02/12/2023)  Received from [facility] - Transportation  Lack of Transportation (Medical): No  Lack of Transportation (Non-Medical): No  Physical Activity: Inactive (02/12/2023)  Received from [facility]  Exercise Vital Sign  Days of Exercise per Week: 0 days  Minutes of Exercise per Session: 0 min  Stress: No Stress Concern Present (02/12/2023)  Received from [facility] - Occupational Stress Questionnaire  Feeling of Stress : Only a little  Social Connections: Moderately  Integrated (02/12/2023)  Received from [facility]  Social Connection and Isolation Panel [NHANES]  Frequency of Communication with Friends and Family: More than three times a week  Frequency of Social Gatherings with Friends and Family: Three times a week  Attends Religious Services: More than 4 times per year  Active Member of  Clubs or Organizations: No  Attends Banker Meetings: More than 4 times per year  Marital Status: Never married   Objective:   Vitals:  08/06/23 1415 08/06/23 1416  BP: 122/80  Pulse: 82  Temp: 36.6 C (97.9 F)  SpO2: 96%  Weight: (!) 107 kg (236 lb)  Height: 165.1 cm (5\' 5" )  PainSc: 0-No pain  PainLoc: Breast   Body mass index is 39.27 kg/m.  Physical Exam Exam conducted with a chaperone present.  HENT:  Head: Normocephalic.  Chest:  Comments: Large pendulous breasts noted  Bruising LUOQ  Musculoskeletal:  Comments: Patient seen in wheelchair  Lymphadenopathy:  Upper Body:  Right upper body: No supraclavicular or axillary adenopathy.  Left upper body: No supraclavicular or axillary adenopathy.  Skin: General: Skin is warm.  Neurological:  Mental Status: She is alert. Mental status is at baseline.  Motor: Weakness present.  Coordination: Coordination abnormal.  Psychiatric:  Mood and Affect: Mood normal.     Labs, Imaging and Diagnostic Testing:  LINICAL DATA: Recall from screening for left breast calcifications and a possible right breast mass.  EXAM: DIGITAL DIAGNOSTIC UNILATERAL LEFT MAMMOGRAM WITH CAD; ULTRASOUND RIGHT BREAST LIMITED; [place] AXILLARY LEFT  TECHNIQUE: Left digital diagnostic mammography was performed. ; Targeted ultrasound examination of the right breast was performed; Targeted ultrasound examination of the left axilla was performed.  COMPARISON: Previous exam(s).  ACR Breast Density Category b: There are scattered areas of fibroglandular density.  FINDINGS: Left breast: There are numerous pleomorphic calcifications that extend throughout the upper outer to lateral left breast, some extending medial to midline, spanning 10 x 8 x 10 cm, with no associated mass or distortion. These calcifications are new when compared to the most recent exam prior to the current screening study, 10/28/2015.  On physical exam, there is  a small scar a previous boil along the medial margin of the right breast.  Targeted right breast ultrasound is performed, showing a hypoechoic lesion within the skin of the right breast at 3 o'clock, 15 cm the nipple, measuring 1.1 x 0.4 x 0.8 cm. This is consistent with the residua of an inclusion cyst or boil, and is consistent in size and location to the mammographic mass.  Targeted bilateral axillary ultrasound is performed, showing multiple prominent lymph nodes with smooth and mild cortical thickening, up to 5 mm. There is no eccentric cortical thickening. On lymph nodes maintain normal hilar fat and normal overall morphology. Findings are symmetric between the left and right axilla.  IMPRESSION: 1. Highly suspicious pleomorphic left breast calcifications spanning 10 cm centered on the upper outer quadrant, with no associated mass or distortion. 2. Benign skin lesion in the far medial right breast corresponds to the mass noted on the current screening exam. 3. Bilateral and symmetric prominent axillary lymph nodes. Patient has a history of HIV as well as hepatitis C. Nodes are consistent with reactive nodes of HIV.  RECOMMENDATION: 1. Stereotactic core needle biopsy x2 of the left breast calcifications targeting the anterior-posterior extents.  I have discussed the findings and recommendations with the patient. If applicable, a reminder letter will be sent to the patient regarding the next appointment.  BI-RADS CATEGORY 5: Highly suggestive of malignancy.   Electronically Signed By: Amie Portland M.D. On: 07/23/2023 15:32 \ FINAL DIAGNOSIS  1. Breast, left, needle core biopsy, calcifications, upper inner : INVASIVE DUCTAL CARCINOMA DUCTAL CARCINOMA IN SITU, EXTENSIVE, CRIBRIFORM/SOLID TYPES, HIGH NUCLEAR GRADE TUBULE FORMATION: SCORE 3 NUCLEAR PLEOMORPHISM: SCORE 3 MITOTIC COUNT: SCORE 2 TOTAL SCORE: 8 OVERALL GRADE: 3 LYMPHOVASCULAR INVASION: NOT  IDENTIFIED CANCER LENGTH: MULTIFOCI WITH 2 MM IN LARGEST LINER LENGTH CALCIFICATIONS: PRESENT OTHER FINDINGS: NONE SEE NOTE  2. Breast, left, needle core biopsy, calcifications, upper outer : DUCTAL CARCINOMA IN SITU, EXTENSIVE, CRIBRIFORM/SOLID TYPES, HIGH NUCLEAR GRADE SUSPICIOUS FOR MICROINVASION NECROSIS: PRESENT CALCIFICATIONS: PRESENT DCIS LENGTH: 2.0 CM SEE NOTE  Diagnosis Note : Dr.Picklemiser reviewed the case and concurs with the interpretation.A breast prognostic profile (ER and PR) is pending and will be reported in an addendum.[facility] was notified on 08/05/2023.  ELECTRONIC SIGNATURE : Lance Coon Md, Pathologist, Electronic Signature  MICROSCOPIC DESCRIPTION  CASE COMMENTS STAINS USED IN DIAGNOSIS: H&E-2 H&E-3 H&E-4 H&E H&E-2 H&E-3 H&E-4 H&E *RECUT 1 SLIDE H&E-2 H&E-3 H&E-4 H&E H&E-2 H&E-3 H&E-4 H&E Stains used in diagnosis 1 Her2 by IHC, 1 ER-ACIS, 1 KI-67-ACIS, 1 PR-ACIS IHC scores are reported using ASCO/CAP scoring criteria. An IHC Score of 0 or 1+ is NEGATIVE for HER2, 3+ is POSITIVE for HER2, and 2+ is EQUIVOCAL. Equivocal results are reflexed to either FISH or IHC testing. Specimens are fixed in 10% Neutral Buffered Formalin for at least 6 hours and up to 72 hours. These tests have not be validated on decalcified tissue. Results should be interpreted with caution given the possibility of false negative results on decalcified specimens. Antibody Clone for HER2 is 4B5 (PATHWAY). Some of these immunohistochemical stains may have been developed and the performance characteristics determined by [facility]. Some may not have been cleared or approved by the U.S. Food and Drug Administration. The FDA has determined that such clearance or approval is not necessary. This test is used for clinical purposes. It should not be regarded as investigational or for research. This laboratory is certified under the  Clinical Laboratory Improvement Amendments of 1988 (CLIA-88) as qualified to perform high complexity clinical laboratory testing. Estrogen receptor (6F11), immunohistochemical stains are performed on formalin fixed, paraffin embedded tissue using a 3,3"-diaminobenzidine (DAB) chromogen and Leica Bond Autostainer System. The staining intensity of the nucleus is scored manually and is reported as the percentage of tumor cell nuclei demonstrating specific nuclear staining.Specimens are fixed in 10% Neutral Buffered Formalin for at least 6 hours and up to 72 hours. These tests have not be validated on decalcified tissue. Results should be interpreted with caution given the possibility of false negative results on decalcified specimens. Ki-67 (MM1), immunohistochemical stains are performed on formalin fixed, paraffin embedded tissue using a 3,3"-diaminobenzidine (DAB) chromogen and Leica Bond Autostainer System. The staining intensity of the nucleus is scored manually and is reported as the percentage of tumor cell nuclei demonstrating specific nuclear staining.Specimens are fixed in 10% Neutral Buffered Formalin for at least 6 hours and up to 72 hours. These tests have not be validated on decalcified tissue. Results should be interpreted with caution given the possibility of false negative results on decalcified specimens. PR progesterone receptor (16), immunohistochemical stains are performed on formalin fixed, paraffin embedded tissue using a 3,3"-diaminobenzidine (DAB) chromogen and Leica Bond Autostainer System. The staining intensity of the nucleus is scored manually and is reported as the percentage of tumor cell nuclei  demonstrating specific nuclear staining.Specimens are fixed in 10% Neutral Buffered Formalin for at least 6 hours and up to 72 hours. These tests have not be validated on decalcified tissue. Results should be interpreted with caution given the possibility of false negative  results on decalcified specimens.  Assessment and Plan:   Diagnoses and all orders for this visit:  Malignant neoplasm of upper-outer quadrant of left female breast, unspecified estrogen receptor status (CMS/HHS-HCC)   Reviewed pathology with the patient today. Given her breast size and overall health, I think breast conserving surgery would be her best option. She has ample breast tissue making breast conservation possible. Discussed left breast lumpectomy utilizing a bracketed approach. Discussed sentinel lymph node mapping as well. Await receptors and refer to medical radiation oncology but plan for breast conserving surgery. Obtain cardiac clearance. Risks and benefits reviewed of bleeding, infection, lymphedema, shoulder stiffness, swelling of the arm, injury to major blood vessels, nerves and veins, wound complications, and the need for the treatment standard procedures. HIV status appears stable.   Hayden Rasmussen, MD

## 2023-09-04 NOTE — Progress Notes (Signed)
Dr. Stephannie Peters made aware that patient last took Ozempic on Monday 09/02/23. No new orders received.

## 2023-09-04 NOTE — Discharge Instructions (Signed)
[facility] Office Phone Number [phone]  BREAST BIOPSY/ PARTIAL MASTECTOMY: POST OP INSTRUCTIONS  Always review your discharge instruction sheet given to you by the facility where your surgery was performed.  IF YOU HAVE DISABILITY OR FAMILY LEAVE FORMS, YOU MUST BRING THEM TO THE OFFICE FOR PROCESSING.  DO NOT GIVE THEM TO YOUR DOCTOR.  A prescription for pain medication may be given to you upon discharge.  Take your pain medication as prescribed, if needed.  If narcotic pain medicine is not needed, then you may take acetaminophen (Tylenol) or ibuprofen (Advil) as needed. Take your usually prescribed medications unless otherwise directed If you need a refill on your pain medication, please contact your pharmacy.  They will contact our office to request authorization.  Prescriptions will not be filled after 5pm or on week-ends. You should eat very light the first 24 hours after surgery, such as soup, crackers, pudding, etc.  Resume your normal diet the day after surgery. Most patients will experience some swelling and bruising in the breast.  Ice packs and a good support bra will help.  Swelling and bruising can take several days to resolve.  It is common to experience some constipation if taking pain medication after surgery.  Increasing fluid intake and taking a stool softener will usually help or prevent this problem from occurring.  A mild laxative (Milk of Magnesia or Miralax) should be taken according to package directions if there are no bowel movements after 48 hours. Unless discharge instructions indicate otherwise, you may remove your bandages 24-48 hours after surgery, and you may shower at that time.  You may have steri-strips (small skin tapes) in place directly over the incision.  These strips should be left on the skin for 7-10 days.  If your surgeon used skin glue on the incision, you may shower in 24 hours.  The glue will flake off over the next 2-3 weeks.  Any  sutures or staples will be removed at the office during your follow-up visit. ACTIVITIES:  You may resume regular daily activities (gradually increasing) beginning the next day.  Wearing a good support bra or sports bra minimizes pain and swelling.  You may have sexual intercourse when it is comfortable. You may drive when you no longer are taking prescription pain medication, you can comfortably wear a seatbelt, and you can safely maneuver your car and apply brakes. RETURN TO WORK:  ______________________________________________________________________________________ You should see your doctor in the office for a follow-up appointment approximately two weeks after your surgery.  Your doctor's nurse will typically make your follow-up appointment when she calls you with your pathology report.  Expect your pathology report 2-3 business days after your surgery.  You may call to check if you do not hear from us after three days. OTHER INSTRUCTIONS: _______________________________________________________________________________________________ _____________________________________________________________________________________________________________________________________ _____________________________________________________________________________________________________________________________________ _____________________________________________________________________________________________________________________________________  WHEN TO CALL YOUR DOCTOR: Fever over 101.0 Nausea and/or vomiting. Extreme swelling or bruising. Continued bleeding from incision. Increased pain, redness, or drainage from the incision.  The clinic staff is available to answer your questions during regular business hours.  Please don't hesitate to call and ask to speak to one of the nurses for clinical concerns.  If you have a medical emergency, go to the nearest emergency room or call 911.  A surgeon from [facility] is always on call at the hospital.  For further questions, please visit [email or web address]   

## 2023-09-04 NOTE — Anesthesia Procedure Notes (Signed)
Procedure Name: Intubation Date/Time: 09/04/2023 4:02 PM  Performed by: Kayleen Memos, CRNAPre-anesthesia Checklist: Patient identified, Emergency Drugs available, Suction available and Patient being monitored Patient Re-evaluated:Patient Re-evaluated prior to induction Oxygen Delivery Method: Circle System Utilized Preoxygenation: Pre-oxygenation with 100% oxygen Induction Type: IV induction Ventilation: Mask ventilation without difficulty Laryngoscope Size: Mac and 4 Grade View: Grade II Tube type: Oral Tube size: 7.0 mm Number of attempts: 1 Airway Equipment and Method: Stylet and Oral airway Placement Confirmation: ETT inserted through vocal cords under direct vision, positive ETCO2 and breath sounds checked- equal and bilateral Secured at: 22 cm Tube secured with: Tape Dental Injury: Teeth and Oropharynx as per pre-operative assessment

## 2023-09-04 NOTE — Interval H&P Note (Signed)
History and Physical Interval Note:  09/04/2023 2:33 PM  Nicole [name]  has presented today for surgery, with the diagnosis of LEFT BREAST CANCER.  The various methods of treatment have been discussed with the patient and family. After consideration of risks, benefits and other options for treatment, the patient has consented to  Procedure(s): LEFT BREAST BRACKETED SEED LOCALIZED LUMPECTOMY (Left) as a surgical intervention.  The patient's history has been reviewed, patient examined, no change in status, stable for surgery.  I have reviewed the patient's chart and labs.  Questions were answered to the patient's satisfaction.     Bensyn Bornemann A Talonda Artist

## 2023-09-04 NOTE — Op Note (Signed)
Preoperative diagnosis: Stage II left breast cancer upper outer quadrant with DCIS ER positive  Postoperative diagnosis: Same  Procedure: Left breast seed localized lumpectomy using bracketed approach  Surgeon: Harriette Bouillon, MD  Anesthesia: General With 0.25% Marcaine with epinephrine  EBL: Minimal  Specimen: Left breast tissue with 2 seeds and 2 clips verified by Faxitron with microcalcifications verified by radiology  Drains: None  Indications for procedure: The patient is a 62 year old female with left breast cancer.  She has multiple medical problems and breast conservation was recommended due to that.  She presents today for extended lumpectomy due to a 4 cm area of disease including microcalcifications left upper outer quadrant breast.  Risks and benefits reviewed as well as alternative therapies.The procedure has been discussed with the patient. Alternatives to surgery have been discussed with the patient.  Risks of surgery include bleeding,  Infection,  Seroma formation, death,  and the need for further surgery.   The patient understands and wishes to proceed.  Description of procedure: The patient was met in the holding area questions were answered.  Left breast marked as correct site and seeds were placed as an outpatient.  She was taken back to the operative room.  She was placed supine upon the operating table.  She had a contracture of her left arm that was noted.  After induction general anesthesia, we positioned her left arm carefully out of the way but we did not put too much tension on it due to the contracture.  The left breast was then prepped and draped in a sterile fashion and timeout performed.  Neoprobe was used to identify both seeds in the left upper outer quadrant.  These were marked with a pen.  A curvilinear incision was made left breast upper outer quadrant.  Both signals were identified and all tissue around both seeds was excised to include the breast tissue down  toward the chest wall.  There was extensive microcalcifications well and went quite wide around this.  The tissue was oriented with ink.  Imaging revealed both seeds and both clips to be present.  Calcifications were noted.  Hemostasis achieved with cautery.  The entire cavity was infiltrated with 0.25% Marcaine with epinephrine.  The deep tissue planes were approximated with 3-0 Vicryl.  4 Monocryl was used to close the skin.  Dermabond applied.  Breast binder placed.  All counts found to be correct.  The patient was awoke extubated taken recovery in satisfactory condition.

## 2023-09-05 ENCOUNTER — Encounter (HOSPITAL_COMMUNITY): Payer: Self-pay | Admitting: Surgery

## 2023-09-05 NOTE — Anesthesia Postprocedure Evaluation (Signed)
Anesthesia Post Note  Patient: ATLEE MCADOO  Procedure(s) Performed: LEFT BREAST BRACKETED SEED LOCALIZED LUMPECTOMY (Left)     Patient location during evaluation: PACU Anesthesia Type: General Level of consciousness: awake and alert Pain management: pain level controlled Vital Signs Assessment: post-procedure vital signs reviewed and stable Respiratory status: spontaneous breathing, nonlabored ventilation, respiratory function stable and patient connected to nasal cannula oxygen Cardiovascular status: blood pressure returned to baseline and stable Postop Assessment: no apparent nausea or vomiting Anesthetic complications: no   No notable events documented.  Last Vitals:  Vitals:   09/04/23 1715 09/04/23 1730  BP: (!) 145/69 (!) 144/79  Pulse: 70 64  Resp: 14 15  Temp:  36.7 C  SpO2: 96% 96%    Last Pain:  Vitals:   09/04/23 1730  PainSc: 0-No pain                 Kennieth Rad

## 2023-09-10 ENCOUNTER — Encounter: Payer: Self-pay | Admitting: *Deleted

## 2023-09-11 LAB — SURGICAL PATHOLOGY

## 2023-09-12 ENCOUNTER — Encounter: Payer: Self-pay | Admitting: *Deleted

## 2023-09-13 ENCOUNTER — Telehealth: Payer: Self-pay | Admitting: Hematology

## 2023-09-13 ENCOUNTER — Encounter: Payer: Self-pay | Admitting: *Deleted

## 2023-09-13 NOTE — Telephone Encounter (Signed)
Called patient twice; left a voicemail regarding scheduled appointment times/dates left callback number if needed for rescheduling

## 2023-09-19 ENCOUNTER — Other Ambulatory Visit: Payer: Self-pay | Admitting: *Deleted

## 2023-09-19 DIAGNOSIS — Z170 Estrogen receptor positive status [ER+]: Secondary | ICD-10-CM

## 2023-09-24 ENCOUNTER — Telehealth: Payer: Self-pay | Admitting: Hematology

## 2023-09-24 ENCOUNTER — Encounter: Payer: Self-pay | Admitting: *Deleted

## 2023-09-24 ENCOUNTER — Inpatient Hospital Stay: Payer: No Typology Code available for payment source | Admitting: Hematology

## 2023-09-24 ENCOUNTER — Inpatient Hospital Stay: Payer: No Typology Code available for payment source | Attending: Hematology

## 2023-09-24 NOTE — Assessment & Plan Note (Deleted)
-  [id number], stag I, G3, ER 20% WEAK, PR-, HER- (0), ki67 60%, with extensive DCIS -Diagnosed in October 2024 -I reviewed his surgical pathology findings, which showed DCIS, 8 mm invasive ductal carcinoma, repeated prognostic panel showed ER 20% weak positive, PR and HER2 negative with high Ki-67 60%, grade 3. -Due to the aggressive nature of her functional triple negative disease, I recommend her to consider adjuvant chemotherapy with weekly Taxol for 12 weeks -Due to the positive margin, she will proceed with re-excision surgery, I recommend port placement with her next surgery.

## 2023-10-01 ENCOUNTER — Ambulatory Visit: Payer: Self-pay

## 2023-10-01 ENCOUNTER — Telehealth: Payer: Self-pay | Admitting: Hematology

## 2023-10-01 NOTE — Telephone Encounter (Signed)
Called patient unable to schedule appt. Left message for patient to call office to reschedule no show.

## 2023-10-01 NOTE — Patient Outreach (Signed)
  Care Coordination   Follow Up Visit Note   10/01/2023 Name: Nicole [name] MRN: [id number] DOB: 03-26-1961  Nicole [name] is a 62 y.o. year old female who sees [name], MD for primary care. I spoke with  Nicole [name] by phone today.  What matters to the patients health and wellness today?  I engaged in a conversation with Nicole [name], during which she conveyed that she is in good health, experiencing no difficulties with sleep or appetite. Her blood sugar measurement today was 117, and her blood pressure was recorded as 120/60. Additionally, she has received her flu vaccination. Nicole [name] also informed me that she underwent a lumpectomy on her left breast; however, she reported that her recovery is proceeding smoothly, with no complications or issues.    Goals Addressed             This Visit's Progress    I want to gain knowledge to manage and maintain my health condition HTN and Type II DM       Patient Goals/Self Care Activities: -Patient/Caregiver will self-administer medications as prescribed as evidenced by self-report/primary caregiver report  -Patient/Caregiver will attend all scheduled provider appointments as evidenced by clinician review of documented attendance to scheduled appointments and patient/caregiver report -Patient/Caregiver will call provider office for new concerns or questions as evidenced by review of documented incoming telephone call notes and patient report -Calls provider office for new concerns, questions, or BP outside discussed parameters -Checks BP and records as discussed -continue to cut down on smoking -drink more water -Continue to walk more with walker to build strength -Monitor the elevations in blood pressure and call physician if they continue BP Readings from Last 3 Encounters:  09/04/23 (!) 144/79  09/02/23 134/70  08/15/23 (!) 141/75   Reviewed medications with patient and discussed importance of medication adherence Counseled on  importance of regular laboratory monitoring as prescribed Provided patient with written educational materials related to hypo and hyperglycemia and importance of correct treatment Review of patient status, including review of consultants reports, relevant laboratory and other test results, and medications completed Lab Results  Component Value Date   HGBA1C 6.1 (H) 09/02/2023             SDOH assessments and interventions completed:  No     Care Coordination Interventions:  Yes, provided   Interventions Today    Flowsheet Row Most Recent Value  Chronic Disease   Chronic disease during today's visit Diabetes, Hypertension (HTN)  General Interventions   General Interventions Discussed/Reviewed General Interventions Discussed  Pharmacy Interventions   Pharmacy Dicussed/Reviewed Pharmacy Topics Discussed  Safety Interventions   Safety Discussed/Reviewed Safety Discussed        Follow up plan: Follow up call scheduled for 11/01/23  11 am    Encounter Outcome:  Patient Visit Completed   [name] RN, BSN, [facility]  [facility], [facility]  Care Coordinator Phone: [phone]

## 2023-10-01 NOTE — Telephone Encounter (Signed)
Vm left with patient with details about schedule scheduled genetics consult. Appointment reminder sent via mail as well/

## 2023-10-01 NOTE — Patient Instructions (Signed)
Visit Information  Thank you for taking time to visit with me today. Please don't hesitate to contact me if I can be of assistance to you.   Following are the goals we discussed today:   Goals Addressed             This Visit's Progress    I want to gain knowledge to manage and maintain my health condition HTN and Type II DM       Patient Goals/Self Care Activities: -Patient/Caregiver will self-administer medications as prescribed as evidenced by self-report/primary caregiver report  -Patient/Caregiver will attend all scheduled provider appointments as evidenced by clinician review of documented attendance to scheduled appointments and patient/caregiver report -Patient/Caregiver will call provider office for new concerns or questions as evidenced by review of documented incoming telephone call notes and patient report -Calls provider office for new concerns, questions, or BP outside discussed parameters -Checks BP and records as discussed -continue to cut down on smoking -drink more water -Continue to walk more with walker to build strength -Monitor the elevations in blood pressure and call physician if they continue BP Readings from Last 3 Encounters:  09/04/23 (!) 144/79  09/02/23 134/70  08/15/23 (!) 141/75   Reviewed medications with patient and discussed importance of medication adherence Counseled on importance of regular laboratory monitoring as prescribed Provided patient with written educational materials related to hypo and hyperglycemia and importance of correct treatment Review of patient status, including review of consultants reports, relevant laboratory and other test results, and medications completed Lab Results  Component Value Date   HGBA1C 6.1 (H) 09/02/2023             Our next appointment is by telephone on 11/01/23 at 11 am  Please call the care guide team at [phone] if you need to cancel or reschedule your appointment.   If you are  experiencing a Mental Health or Behavioral Health Crisis or need someone to talk to, please call 1-800-273-TALK (toll free, 24 hour hotline)  The patient verbalized understanding of instructions, educational materials, and care plan provided today and agreed to receive a mailed copy of patient instructions, educational materials, and care plan.   [name] RN, BSN, [facility]  [facility], [facility]  Care Coordinator Phone: [phone]

## 2023-10-04 ENCOUNTER — Telehealth: Payer: Self-pay | Admitting: Hematology

## 2023-10-07 ENCOUNTER — Encounter: Payer: Self-pay | Admitting: *Deleted

## 2023-10-07 NOTE — Assessment & Plan Note (Signed)
-  pT1bN0M0, stag I, G3, ER 20% WEAK, PR-, HER- (0), ki67 60%, with extensive DCIS -Diagnosed in October 2024 -I reviewed his surgical pathology findings, which showed DCIS, 8 mm invasive ductal carcinoma, repeated prognostic panel showed ER 20% weak positive, PR and HER2 negative with high Ki-67 60%, grade 3. -Due to the aggressive nature of her functional triple negative disease, I recommend her to consider adjuvant chemotherapy with weekly Taxol for 12 weeks -Due to the positive margin, she will proceed with re-excision surgery, I recommend port placement with her next surgery.

## 2023-10-08 ENCOUNTER — Encounter: Payer: Self-pay | Admitting: *Deleted

## 2023-10-08 ENCOUNTER — Other Ambulatory Visit: Payer: Medicare HMO

## 2023-10-08 ENCOUNTER — Inpatient Hospital Stay (HOSPITAL_BASED_OUTPATIENT_CLINIC_OR_DEPARTMENT_OTHER): Payer: No Typology Code available for payment source | Admitting: Hematology

## 2023-10-08 DIAGNOSIS — Z170 Estrogen receptor positive status [ER+]: Secondary | ICD-10-CM | POA: Diagnosis not present

## 2023-10-08 DIAGNOSIS — C50412 Malignant neoplasm of upper-outer quadrant of left female breast: Secondary | ICD-10-CM | POA: Diagnosis not present
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
## 2023-10-14 ENCOUNTER — Ambulatory Visit: Payer: Self-pay | Admitting: Surgery

## 2023-10-21 ENCOUNTER — Encounter: Payer: Self-pay | Admitting: *Deleted

## 2023-11-01 ENCOUNTER — Ambulatory Visit: Payer: Self-pay

## 2023-11-01 NOTE — Patient Outreach (Signed)
  Care Coordination   Follow Up Visit Note   11/01/2023 Name: Nicole [name] MRN: [id number] DOB: May 04, 1961  Nicole [name] is a 62 y.o. year old female who sees [name], MD for primary care. I spoke with  [name] by phone today.  What matters to the patients health and wellness today?   [name] reports her current health status as stable. She is scheduled for the placement of a port on January 2nd in preparation for the commencement of her chemotherapy treatment. While she occasionally experiences feelings of sadness or depression regarding her condition, she expresses a commitment to persevere. [name] monitors her blood pressure and blood sugar levels on a daily basis; her recent measurements indicate a blood pressure reading of 113/60 and a blood sugar level of 119. She has no reported symptoms of headaches or chest pain, and there is no observable swelling. Furthermore, she adheres to her prescribed medication regimen.    Goals Addressed             This Visit's Progress    I want to gain knowledge to manage and maintain my health condition HTN and Type II DM       Patient Goals/Self Care Activities: -Patient/Caregiver will self-administer medications as prescribed as evidenced by self-report/primary caregiver report  -Patient/Caregiver will attend all scheduled provider appointments as evidenced by clinician review of documented attendance to scheduled appointments and patient/caregiver report -Patient/Caregiver will call provider office for new concerns or questions as evidenced by review of documented incoming telephone call notes and patient report -Calls provider office for new concerns, questions, or BP outside discussed parameters -Checks BP and records as discussed -continue to cut down on smoking -drink more water -Continue to walk more with walker to build strength -Monitor the elevations in blood pressure and call physician if they continue BP Readings  from Last 3 Encounters:  09/04/23 (!) 144/79  09/02/23 134/70  08/15/23 (!) 141/75   Reviewed medications with patient and discussed importance of medication adherence Counseled on importance of regular laboratory monitoring as prescribed Provided patient with written educational materials related to hypo and hyperglycemia and importance of correct treatment Review of patient status, including review of consultants reports, relevant laboratory and other test results, and medications completed Lab Results  Component Value Date   HGBA1C 6.1 (H) 09/02/2023            SDOH assessments and interventions completed:  No     Care Coordination Interventions:  Yes, provided   Interventions Today    Flowsheet Row Most Recent Value  Chronic Disease   Chronic disease during today's visit Diabetes, Hypertension (HTN)  General Interventions   General Interventions Discussed/Reviewed General Interventions Discussed  Pharmacy Interventions   Pharmacy Dicussed/Reviewed Pharmacy Topics Discussed  Safety Interventions   Safety Discussed/Reviewed Safety Discussed        Follow up plan: Follow up call scheduled for 12/11/23  1130 am    Encounter Outcome:  Patient Visit Completed   [name] RN, BSN, [facility] [place], [facility]  Care Coordinator Phone: [phone]

## 2023-11-01 NOTE — Patient Instructions (Signed)
Visit Information  Thank you for taking time to visit with me today. Please don't hesitate to contact me if I can be of assistance to you.   Following are the goals we discussed today:   Goals Addressed             This Visit's Progress    I want to gain knowledge to manage and maintain my health condition HTN and Type II DM       Patient Goals/Self Care Activities: -Patient/Caregiver will self-administer medications as prescribed as evidenced by self-report/primary caregiver report  -Patient/Caregiver will attend all scheduled provider appointments as evidenced by clinician review of documented attendance to scheduled appointments and patient/caregiver report -Patient/Caregiver will call provider office for new concerns or questions as evidenced by review of documented incoming telephone call notes and patient report -Calls provider office for new concerns, questions, or BP outside discussed parameters -Checks BP and records as discussed -continue to cut down on smoking -drink more water -Continue to walk more with walker to build strength -Monitor the elevations in blood pressure and call physician if they continue BP Readings from Last 3 Encounters:  09/04/23 (!) 144/79  09/02/23 134/70  08/15/23 (!) 141/75   Reviewed medications with patient and discussed importance of medication adherence Counseled on importance of regular laboratory monitoring as prescribed Provided patient with written educational materials related to hypo and hyperglycemia and importance of correct treatment Review of patient status, including review of consultants reports, relevant laboratory and other test results, and medications completed Lab Results  Component Value Date   HGBA1C 6.1 (H) 09/02/2023   Last practice recorded BP readings:  BP Readings from Last 3 Encounters:  09/04/23 (!) 144/79  09/02/23 134/70  08/15/23 (!) 141/75   Most recent eGFR/CrCl:  Lab Results  Component Value Date    EGFR 100 05/14/2023    No components found for: "CRCL"  Provided education to patient re: stroke prevention, s/s of heart attack and stroke Discussed complications of poorly controlled blood pressure such as heart disease, stroke, circulatory complications, vision complications, kidney impairment, sexual dysfunction            Our next appointment is by telephone on 12/11/23 at 1130 am  Please call the care guide team at [phone] if you need to cancel or reschedule your appointment.   If you are experiencing a Mental Health or Behavioral Health Crisis or need someone to talk to, please call 1-800-273-TALK (toll free, 24 hour hotline)  The patient verbalized understanding of instructions, educational materials, and care plan provided today.    [name] RN, BSN, [facility]  [facility], [facility]  Care Coordinator Phone: [phone]

## 2023-11-08 ENCOUNTER — Other Ambulatory Visit: Payer: Self-pay

## 2023-11-08 ENCOUNTER — Encounter (HOSPITAL_COMMUNITY): Payer: Self-pay | Admitting: Surgery

## 2023-11-08 NOTE — Progress Notes (Addendum)
PCP - Dr. [name] Cardiologist - Dr. Dietrich Pates  PPM/ICD - denies   Chest x-ray - 10/24/20 EKG - 04/12/23 Stress Test - denies ECHO - 05/14/23 Cardiac Cath - denies  CPAP - denies  Fasting Blood Sugar - 90-100 Checks Blood Sugar twice/day  Last dose of Semaglutide: 12/2 Pt instructed to hold Farxiga for 72 hours. Last dose will be 12/29.   Blood Thinner Instructions: n/a Aspirin Instructions: Pt took last dose 12/24  ERAS Protcol - clears until 0430  COVID TEST- n/a  Anesthesia review: yes, cardiac hx  Patient verbally denies any shortness of breath, fever, cough and chest pain during phone call   -------------  SDW INSTRUCTIONS given:  Your procedure is scheduled on 11/14/23.  Report to [facility] Main Entrance "A" at 0530 A.M., and check in at the Admitting office.  Call this number if you have problems the morning of surgery:  [phone]   Remember:  Do not eat after midnight the night before your surgery  You may drink clear liquids until 0430 the morning of your surgery.   Clear liquids allowed are: Water, Non-Citrus Juices (without pulp), Carbonated Beverages, Clear Tea, Black Coffee Only, and Gatorade    Take these medicines the morning of surgery with A SIP OF WATER  bupropion, genvoya, lexapro, prilosec, crestor      PRN: oxy IR    As of today, STOP taking any Aspirin (unless otherwise instructed by your surgeon) Aleve, Naproxen, Ibuprofen, Motrin, Advil, Goody's, BC's, all herbal medications, fish oil, and all vitamins.                      Do not wear jewelry, make up, or nail polish            Do not wear lotions, powders, perfumes/colognes, or deodorant.            Do not shave 48 hours prior to surgery.  Men may shave face and neck.            Do not bring valuables to the hospital.            [facility] is not responsible for any belongings or valuables.  Do NOT Smoke (Tobacco/Vaping) 24 hours prior to your procedure If you use a CPAP at  night, you may bring all equipment for your overnight stay.   Contacts, glasses, dentures or bridgework may not be worn into surgery.      For patients admitted to the hospital, discharge time will be determined by your treatment team.   Patients discharged the day of surgery will not be allowed to drive home, and someone needs to stay with them for 24 hours.    Special instructions:   [place]- Preparing For Surgery  Before surgery, you can play an important role. Because skin is not sterile, your skin needs to be as free of germs as possible. You can reduce the number of germs on your skin by washing with CHG (chlorahexidine gluconate) Soap before surgery.  CHG is an antiseptic cleaner which kills germs and bonds with the skin to continue killing germs even after washing.    Oral Hygiene is also important to reduce your risk of infection.  Remember - BRUSH YOUR TEETH THE MORNING OF SURGERY WITH YOUR REGULAR TOOTHPASTE  Please do not use if you have an allergy to CHG or antibacterial soaps. If your skin becomes reddened/irritated stop using the CHG.  Do not shave (including  legs and underarms) for at least 48 hours prior to first CHG shower. It is OK to shave your face.  Please follow these instructions carefully.   Shower the NIGHT BEFORE SURGERY and the MORNING OF SURGERY with DIAL Soap.   Pat yourself dry with a CLEAN TOWEL.  Wear CLEAN PAJAMAS to bed the night before surgery  Place CLEAN SHEETS on your bed the night of your first shower and DO NOT SLEEP WITH PETS.   Day of Surgery: Please shower morning of surgery  Wear Clean/Comfortable clothing the morning of surgery Do not apply any deodorants/lotions.   Remember to brush your teeth WITH YOUR REGULAR TOOTHPASTE.   Questions were answered. Patient verbalized understanding of instructions.

## 2023-11-12 ENCOUNTER — Inpatient Hospital Stay: Payer: Medicare HMO | Attending: Hematology | Admitting: Genetic Counselor

## 2023-11-12 ENCOUNTER — Other Ambulatory Visit: Payer: Self-pay

## 2023-11-12 ENCOUNTER — Inpatient Hospital Stay: Payer: Medicare HMO

## 2023-11-12 ENCOUNTER — Other Ambulatory Visit: Payer: Self-pay | Admitting: Genetic Counselor

## 2023-11-12 ENCOUNTER — Encounter: Payer: Self-pay | Admitting: Genetic Counselor

## 2023-11-12 DIAGNOSIS — F17210 Nicotine dependence, cigarettes, uncomplicated: Secondary | ICD-10-CM | POA: Diagnosis not present

## 2023-11-12 DIAGNOSIS — E785 Hyperlipidemia, unspecified: Secondary | ICD-10-CM | POA: Insufficient documentation

## 2023-11-12 DIAGNOSIS — Z79899 Other long term (current) drug therapy: Secondary | ICD-10-CM | POA: Insufficient documentation

## 2023-11-12 DIAGNOSIS — E119 Type 2 diabetes mellitus without complications: Secondary | ICD-10-CM | POA: Diagnosis not present

## 2023-11-12 DIAGNOSIS — Z170 Estrogen receptor positive status [ER+]: Secondary | ICD-10-CM

## 2023-11-12 DIAGNOSIS — Z8042 Family history of malignant neoplasm of prostate: Secondary | ICD-10-CM | POA: Diagnosis not present

## 2023-11-12 DIAGNOSIS — I10 Essential (primary) hypertension: Secondary | ICD-10-CM | POA: Insufficient documentation

## 2023-11-12 DIAGNOSIS — K219 Gastro-esophageal reflux disease without esophagitis: Secondary | ICD-10-CM | POA: Diagnosis not present

## 2023-11-12 DIAGNOSIS — Z1379 Encounter for other screening for genetic and chromosomal anomalies: Secondary | ICD-10-CM

## 2023-11-12 DIAGNOSIS — Z800 Family history of malignant neoplasm of digestive organs: Secondary | ICD-10-CM | POA: Insufficient documentation

## 2023-11-12 DIAGNOSIS — C50412 Malignant neoplasm of upper-outer quadrant of left female breast: Secondary | ICD-10-CM | POA: Insufficient documentation

## 2023-11-12 LAB — COMPREHENSIVE METABOLIC PANEL
ALT: 16 U/L (ref 0–44)
AST: 23 U/L (ref 15–41)
Albumin: 3.6 g/dL (ref 3.5–5.0)
Alkaline Phosphatase: 71 U/L (ref 38–126)
Anion gap: 4 — ABNORMAL LOW (ref 5–15)
BUN: 8 mg/dL (ref 8–23)
CO2: 26 mmol/L (ref 22–32)
Calcium: 8.9 mg/dL (ref 8.9–10.3)
Chloride: 108 mmol/L (ref 98–111)
Creatinine, Ser: 0.61 mg/dL (ref 0.44–1.00)
GFR, Estimated: >60 mL/min (ref >60)
Glucose, Bld: 106 mg/dL — ABNORMAL HIGH (ref 70–99)
Potassium: 3.5 mmol/L (ref 3.5–5.1)
Sodium: 138 mmol/L (ref 135–145)
Total Bilirubin: 0.4 mg/dL (ref 0.0–1.2)
Total Protein: 8.2 g/dL — ABNORMAL HIGH (ref 6.5–8.1)

## 2023-11-12 LAB — CBC WITH DIFFERENTIAL/PLATELET
Abs Immature Granulocytes: 0.01 10*3/uL (ref 0.00–0.07)
Basophils Absolute: 0 10*3/uL (ref 0.0–0.1)
Basophils Relative: 1 %
Eosinophils Absolute: 0.1 10*3/uL (ref 0.0–0.5)
Eosinophils Relative: 3 %
HCT: 39.4 % (ref 36.0–46.0)
Hemoglobin: 13.9 g/dL (ref 12.0–15.0)
Immature Granulocytes: 0 %
Lymphocytes Relative: 40 %
Lymphs Abs: 1.3 10*3/uL (ref 0.7–4.0)
MCH: 30.3 pg (ref 26.0–34.0)
MCHC: 35.3 g/dL (ref 30.0–36.0)
MCV: 86 fL (ref 80.0–100.0)
Monocytes Absolute: 0.4 10*3/uL (ref 0.1–1.0)
Monocytes Relative: 12 %
Neutro Abs: 1.5 10*3/uL — ABNORMAL LOW (ref 1.7–7.7)
Neutrophils Relative %: 44 %
Platelets: 130 10*3/uL — ABNORMAL LOW (ref 150–400)
RBC: 4.58 MIL/uL (ref 3.87–5.11)
RDW: 13.3 % (ref 11.5–15.5)
WBC: 3.2 10*3/uL — ABNORMAL LOW (ref 4.0–10.5)
nRBC: 0 % (ref 0.0–0.2)

## 2023-11-12 LAB — GENETIC SCREENING ORDER
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
## 2023-11-13 NOTE — Anesthesia Preprocedure Evaluation (Addendum)
 Anesthesia Evaluation  Patient identified by MRN, date of birth, ID band Patient awake    Reviewed: Allergy & Precautions, NPO status , Patient's Chart, lab work & pertinent test results  History of Anesthesia Complications Negative for: history of anesthetic complications  Airway Mallampati: II  TM Distance: >3 FB Neck ROM: Full    Dental no notable dental hx. (+) Missing, Poor Dentition,    Pulmonary Current Smoker and Patient abstained from smoking.   Pulmonary exam normal breath sounds clear to auscultation       Cardiovascular hypertension, Pt. on medications Normal cardiovascular exam Rhythm:Regular Rate:Normal  05/14/2023 Echo  showed EF 70 to 75%, normal RV function, no significant valvular abnormalities..  Last seen by Lamarr Satterfield, NP on 10 8 for preop evaluation.  Per note, According to the Revised Cardiac Risk Index (RCRI), her Perioperative Risk of Major Cardiac Event is (%): 0.9. Her Functional Capacity in METs is: 3.08 according to the Duke Activity Status Index (DASI). Activity level is severely limited due to CVA and is wheelchair bound.   Neuro/Psych   Anxiety Depression    CVA (CVA (2022) right side weak, uses walker), Residual Symptoms    GI/Hepatic ,GERD  ,,(+) Hepatitis -, C  Endo/Other  diabetes, Type 2    Renal/GU negative Renal ROS  negative genitourinary   Musculoskeletal  (+) Arthritis ,    Abdominal  (+) + obese (BMI 37)  Peds  Hematology  (+) HIV  Anesthesia Other Findings L breast mass re-excision lumpecomp and port placement.  Reproductive/Obstetrics                              Anesthesia Physical Anesthesia Plan  ASA: 3  Anesthesia Plan: General   Post-op Pain Management: Tylenol  PO (pre-op)*   Induction: Intravenous  PONV Risk Score and Plan: 2 and Ondansetron , Dexamethasone , Treatment may vary due to age or medical condition and  Midazolam   Airway Management Planned: LMA  Additional Equipment: None  Intra-op Plan:   Post-operative Plan: Extubation in OR  Informed Consent: I have reviewed the patients History and Physical, chart, labs and discussed the procedure including the risks, benefits and alternatives for the proposed anesthesia with the patient or authorized representative who has indicated his/her understanding and acceptance.     Dental advisory given  Plan Discussed with: CRNA and Anesthesiologist  Anesthesia Plan Comments:          Anesthesia Quick Evaluation

## 2023-11-14 ENCOUNTER — Ambulatory Visit (HOSPITAL_COMMUNITY)
Admission: RE | Admit: 2023-11-14 | Discharge: 2023-11-14 | Disposition: A | Discharge status: Home / Self Care | Payer: 59 | Attending: Surgery | Admitting: Surgery

## 2023-11-14 ENCOUNTER — Encounter (HOSPITAL_COMMUNITY)
Admission: RE | Disposition: A | Discharge status: Home / Self Care | Payer: Self-pay | Source: Home / Self Care | Attending: Surgery

## 2023-11-14 ENCOUNTER — Other Ambulatory Visit: Payer: Self-pay

## 2023-11-14 ENCOUNTER — Encounter (HOSPITAL_COMMUNITY): Payer: Self-pay | Admitting: Surgery

## 2023-11-14 ENCOUNTER — Ambulatory Visit (HOSPITAL_COMMUNITY): Payer: 59

## 2023-11-14 ENCOUNTER — Ambulatory Visit (HOSPITAL_COMMUNITY): Payer: 59 | Admitting: Anesthesiology

## 2023-11-14 ENCOUNTER — Ambulatory Visit (HOSPITAL_BASED_OUTPATIENT_CLINIC_OR_DEPARTMENT_OTHER): Payer: 59 | Admitting: Anesthesiology

## 2023-11-14 DIAGNOSIS — C50412 Malignant neoplasm of upper-outer quadrant of left female breast: Secondary | ICD-10-CM | POA: Insufficient documentation

## 2023-11-14 DIAGNOSIS — E119 Type 2 diabetes mellitus without complications: Secondary | ICD-10-CM | POA: Insufficient documentation

## 2023-11-14 DIAGNOSIS — F17210 Nicotine dependence, cigarettes, uncomplicated: Secondary | ICD-10-CM | POA: Diagnosis not present

## 2023-11-14 DIAGNOSIS — I10 Essential (primary) hypertension: Secondary | ICD-10-CM | POA: Diagnosis not present

## 2023-11-14 DIAGNOSIS — C50912 Malignant neoplasm of unspecified site of left female breast: Secondary | ICD-10-CM | POA: Diagnosis not present

## 2023-11-14 DIAGNOSIS — B182 Chronic viral hepatitis C: Secondary | ICD-10-CM | POA: Diagnosis not present

## 2023-11-14 DIAGNOSIS — Z21 Asymptomatic human immunodeficiency virus [HIV] infection status: Secondary | ICD-10-CM | POA: Diagnosis not present

## 2023-11-14 DIAGNOSIS — F17200 Nicotine dependence, unspecified, uncomplicated: Secondary | ICD-10-CM | POA: Diagnosis not present

## 2023-11-14 DIAGNOSIS — Z7985 Long-term (current) use of injectable non-insulin antidiabetic drugs: Secondary | ICD-10-CM | POA: Insufficient documentation

## 2023-11-14 DIAGNOSIS — Z6837 Body mass index (BMI) 37.0-37.9, adult: Secondary | ICD-10-CM | POA: Diagnosis not present

## 2023-11-14 DIAGNOSIS — K219 Gastro-esophageal reflux disease without esophagitis: Secondary | ICD-10-CM | POA: Diagnosis not present

## 2023-11-14 DIAGNOSIS — Z7984 Long term (current) use of oral hypoglycemic drugs: Secondary | ICD-10-CM | POA: Insufficient documentation

## 2023-11-14 DIAGNOSIS — Z170 Estrogen receptor positive status [ER+]: Secondary | ICD-10-CM

## 2023-11-14 DIAGNOSIS — Z452 Encounter for adjustment and management of vascular access device: Secondary | ICD-10-CM | POA: Diagnosis not present

## 2023-11-14 HISTORY — PX: PORTACATH PLACEMENT: SHX2246

## 2023-11-14 HISTORY — PX: RE-EXCISION OF BREAST LUMPECTOMY: SHX6048

## 2023-11-14 LAB — GLUCOSE, CAPILLARY
Glucose-Capillary: 109 mg/dL — ABNORMAL HIGH (ref 70–99)
Glucose-Capillary: 125 mg/dL — ABNORMAL HIGH (ref 70–99)

## 2023-11-14 SURGERY — EXCISION, LESION, BREAST
Anesthesia: General | Site: Chest | Laterality: Right

## 2023-11-14 MED ORDER — BUPIVACAINE-EPINEPHRINE (PF) 0.25% -1:200000 IJ SOLN
INTRAMUSCULAR | Status: AC
  Filled 2023-11-14: qty 30

## 2023-11-14 MED ORDER — CHLORHEXIDINE GLUCONATE CLOTH 2 % EX PADS: 6.0000 | MEDICATED_PAD | Freq: Once | CUTANEOUS | Status: DC

## 2023-11-14 MED ORDER — HEPARIN 6000 UNIT IRRIGATION SOLUTION
Status: DC | PRN
  Administered 2023-11-14: 1

## 2023-11-14 MED ORDER — HEPARIN SOD (PORK) LOCK FLUSH 100 UNIT/ML IV SOLN
INTRAVENOUS | Status: AC
  Filled 2023-11-14: qty 5

## 2023-11-14 MED ORDER — ONDANSETRON HCL 4 MG/2ML IJ SOLN: 4.0000 mg | Freq: Once | INTRAMUSCULAR | Status: DC | PRN

## 2023-11-14 MED ORDER — LACTATED RINGERS IV SOLN: INTRAVENOUS | Status: DC

## 2023-11-14 MED ORDER — ONDANSETRON HCL 4 MG/2ML IJ SOLN
INTRAMUSCULAR | Status: DC | PRN
  Administered 2023-11-14: 4 mg via INTRAVENOUS

## 2023-11-14 MED ORDER — LIDOCAINE 2% (20 MG/ML) 5 ML SYRINGE
INTRAMUSCULAR | Status: DC | PRN
  Administered 2023-11-14: 100 mg via INTRAVENOUS

## 2023-11-14 MED ORDER — BUPIVACAINE-EPINEPHRINE 0.25% -1:200000 IJ SOLN
INTRAMUSCULAR | Status: DC | PRN
  Administered 2023-11-14: 17 mL
  Administered 2023-11-14: 10 mL

## 2023-11-14 MED ORDER — PROPOFOL 10 MG/ML IV BOLUS
INTRAVENOUS | Status: AC
  Filled 2023-11-14: qty 20

## 2023-11-14 MED ORDER — DEXAMETHASONE SODIUM PHOSPHATE 10 MG/ML IJ SOLN
INTRAMUSCULAR | Status: AC
  Filled 2023-11-14: qty 1

## 2023-11-14 MED ORDER — DEXMEDETOMIDINE HCL IN NACL 80 MCG/20ML IV SOLN
INTRAVENOUS | Status: DC | PRN
  Administered 2023-11-14: 8 ug via INTRAVENOUS

## 2023-11-14 MED ORDER — ORAL CARE MOUTH RINSE: 15.0000 mL | Freq: Once | OROMUCOSAL | Status: AC

## 2023-11-14 MED ORDER — DEXAMETHASONE SODIUM PHOSPHATE 10 MG/ML IJ SOLN
INTRAMUSCULAR | Status: DC | PRN
  Administered 2023-11-14: 10 mg via INTRAVENOUS

## 2023-11-14 MED ORDER — 0.9 % SODIUM CHLORIDE (POUR BTL) OPTIME
TOPICAL | Status: DC | PRN
  Administered 2023-11-14: 1000 mL

## 2023-11-14 MED ORDER — HYDROMORPHONE HCL 1 MG/ML IJ SOLN: 0.2500 mg | INTRAMUSCULAR | Status: DC | PRN

## 2023-11-14 MED ORDER — FENTANYL CITRATE (PF) 250 MCG/5ML IJ SOLN
INTRAMUSCULAR | Status: AC
  Filled 2023-11-14: qty 5

## 2023-11-14 MED ORDER — PHENYLEPHRINE 80 MCG/ML (10ML) SYRINGE FOR IV PUSH (FOR BLOOD PRESSURE SUPPORT)
PREFILLED_SYRINGE | INTRAVENOUS | Status: DC | PRN
  Administered 2023-11-14 (×5): 160 ug via INTRAVENOUS

## 2023-11-14 MED ORDER — OXYCODONE HCL 5 MG PO TABS: 5.0000 mg | ORAL_TABLET | Freq: Once | ORAL | Status: DC | PRN

## 2023-11-14 MED ORDER — HEPARIN SOD (PORK) LOCK FLUSH 100 UNIT/ML IV SOLN
INTRAVENOUS | Status: DC | PRN
  Administered 2023-11-14: 450 [IU] via INTRAVENOUS

## 2023-11-14 MED ORDER — PROPOFOL 10 MG/ML IV BOLUS
INTRAVENOUS | Status: DC | PRN
  Administered 2023-11-14: 17 mg via INTRAVENOUS

## 2023-11-14 MED ORDER — HEPARIN 6000 UNIT IRRIGATION SOLUTION
Status: AC
  Filled 2023-11-14: qty 500

## 2023-11-14 MED ORDER — MIDAZOLAM HCL 2 MG/2ML IJ SOLN
INTRAMUSCULAR | Status: DC | PRN
  Administered 2023-11-14: 2 mg via INTRAVENOUS

## 2023-11-14 MED ORDER — CHLORHEXIDINE GLUCONATE 0.12 % MT SOLN
15.0000 mL | Freq: Once | OROMUCOSAL | Status: AC
  Administered 2023-11-14: 15 mL via OROMUCOSAL
  Filled 2023-11-14: qty 15

## 2023-11-14 MED ORDER — PHENYLEPHRINE HCL-NACL 20-0.9 MG/250ML-% IV SOLN
INTRAVENOUS | Status: DC | PRN
  Administered 2023-11-14: 50 ug/min via INTRAVENOUS

## 2023-11-14 MED ORDER — OXYCODONE HCL 5 MG PO TABS: 5.0000 mg | ORAL_TABLET | Freq: Four times a day (QID) | ORAL | 0 refills | Status: DC | PRN

## 2023-11-14 MED ORDER — ONDANSETRON HCL 4 MG/2ML IJ SOLN
INTRAMUSCULAR | Status: AC
  Filled 2023-11-14: qty 2

## 2023-11-14 MED ORDER — CLINDAMYCIN PHOSPHATE 900 MG/50ML IV SOLN
900.0000 mg | INTRAVENOUS | Status: AC
  Administered 2023-11-14: 900 mg via INTRAVENOUS
  Filled 2023-11-14: qty 50

## 2023-11-14 MED ORDER — MIDAZOLAM HCL 2 MG/2ML IJ SOLN
INTRAMUSCULAR | Status: AC
  Filled 2023-11-14: qty 2

## 2023-11-14 MED ORDER — INSULIN ASPART 100 UNIT/ML IJ SOLN: 0.0000 [IU] | INTRAMUSCULAR | Status: DC | PRN

## 2023-11-14 MED ORDER — OXYCODONE HCL 5 MG/5ML PO SOLN
5.0000 mg | Freq: Once | ORAL | Status: DC | PRN
Start: 2023-11-14 — End: 2023-11-14

## 2023-11-14 MED ORDER — FENTANYL CITRATE (PF) 250 MCG/5ML IJ SOLN
INTRAMUSCULAR | Status: DC | PRN
  Administered 2023-11-14 (×4): 50 ug via INTRAVENOUS

## 2023-11-14 MED ORDER — KETOROLAC TROMETHAMINE 30 MG/ML IJ SOLN: 30.0000 mg | Freq: Once | INTRAMUSCULAR | Status: DC | PRN

## 2023-11-14 SURGICAL SUPPLY — 47 items
APPLIER CLIP 9.375 MED OPEN (MISCELLANEOUS) ×2
BAG COUNTER SPONGE SURGICOUNT (BAG) ×2 IMPLANT
BAG DECANTER FOR FLEXI CONT (MISCELLANEOUS) ×2 IMPLANT
BINDER BREAST BLACK XXL (GAUZE/BANDAGES/DRESSINGS) IMPLANT
BINDER BREAST LRG (GAUZE/BANDAGES/DRESSINGS) IMPLANT
BINDER BREAST XLRG (GAUZE/BANDAGES/DRESSINGS) IMPLANT
CANISTER SUCT 3000ML PPV (MISCELLANEOUS) IMPLANT
CHLORAPREP W/TINT 26 (MISCELLANEOUS) ×2 IMPLANT
CLIP APPLIE 9.375 MED OPEN (MISCELLANEOUS) IMPLANT
CNTNR URN SCR LID CUP LEK RST (MISCELLANEOUS) IMPLANT
COVER PROBE W GEL 5X96 (DRAPES) ×2 IMPLANT
COVER SURGICAL LIGHT HANDLE (MISCELLANEOUS) ×2 IMPLANT
COVER TRANSDUCER ULTRASND GEL (DISPOSABLE) ×2 IMPLANT
DERMABOND ADVANCED .7 DNX12 (GAUZE/BANDAGES/DRESSINGS) ×2 IMPLANT
DRAPE C-ARM 42X120 X-RAY (DRAPES) ×2 IMPLANT
DRAPE CHEST BREAST 15X10 FENES (DRAPES) ×2 IMPLANT
ELECT CAUTERY BLADE 6.4 (BLADE) ×2 IMPLANT
ELECT REM PT RETURN 9FT ADLT (ELECTROSURGICAL) ×2
ELECTRODE REM PT RTRN 9FT ADLT (ELECTROSURGICAL) ×2 IMPLANT
GAUZE 4X4 16PLY ~~LOC~~+RFID DBL (SPONGE) ×2 IMPLANT
GAUZE PAD ABD 8X10 STRL (GAUZE/BANDAGES/DRESSINGS) ×2 IMPLANT
GEL ULTRASOUND 20GR AQUASONIC (MISCELLANEOUS) IMPLANT
GLOVE BIO SURGEON STRL SZ8 (GLOVE) ×2 IMPLANT
GLOVE BIOGEL PI IND STRL 8 (GLOVE) ×2 IMPLANT
GOWN STRL REUS W/ TWL LRG LVL3 (GOWN DISPOSABLE) ×2 IMPLANT
GOWN STRL REUS W/ TWL XL LVL3 (GOWN DISPOSABLE) ×2 IMPLANT
KIT BASIN OR (CUSTOM PROCEDURE TRAY) ×2 IMPLANT
KIT MARKER MARGIN INK (KITS) ×2 IMPLANT
KIT PORT POWER 8FR ISP CVUE (Port) IMPLANT
KIT TURNOVER KIT B (KITS) ×2 IMPLANT
NDL HYPO 25GX1X1/2 BEV (NEEDLE) ×2 IMPLANT
NEEDLE HYPO 25GX1X1/2 BEV (NEEDLE) ×2 IMPLANT
NS IRRIG 1000ML POUR BTL (IV SOLUTION) ×2 IMPLANT
PACK GENERAL/GYN (CUSTOM PROCEDURE TRAY) ×2 IMPLANT
PAD ARMBOARD 7.5X6 YLW CONV (MISCELLANEOUS) ×2 IMPLANT
PENCIL BUTTON HOLSTER BLD 10FT (ELECTRODE) ×2 IMPLANT
POSITIONER HEAD DONUT 9IN (MISCELLANEOUS) ×2 IMPLANT
SUT MNCRL AB 4-0 PS2 18 (SUTURE) ×2 IMPLANT
SUT PROLENE 2 0 SH 30 (SUTURE) ×2 IMPLANT
SUT VIC AB 3-0 SH 27X BRD (SUTURE) ×2 IMPLANT
SUT VIC AB 3-0 SH 8-18 (SUTURE) ×2 IMPLANT
SYR 5ML LUER SLIP (SYRINGE) ×2 IMPLANT
SYR CONTROL 10ML LL (SYRINGE) ×2 IMPLANT
TOWEL GREEN STERILE (TOWEL DISPOSABLE) ×2 IMPLANT
TOWEL GREEN STERILE FF (TOWEL DISPOSABLE) ×2 IMPLANT
TRAY LAPAROSCOPIC MC (CUSTOM PROCEDURE TRAY) ×2 IMPLANT
TUBE CONNECTING 12X1/4 (SUCTIONS) IMPLANT
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
## 2023-11-14 NOTE — Anesthesia Postprocedure Evaluation (Signed)
 Anesthesia Post Note  Patient: Nicole [name]  Procedure(s) Performed: LEFT BREAST RE-EXCISION LUMPECTOMY (Left: Breast) PORT PLACEMENT WITH ULTRASOUND GUIDANCE (Right: Chest)     Patient location during evaluation: PACU Anesthesia Type: General Level of consciousness: awake and alert Pain management: pain level controlled Vital Signs Assessment: post-procedure vital signs reviewed and stable Respiratory status: spontaneous breathing, nonlabored ventilation, respiratory function stable and patient connected to nasal cannula oxygen Cardiovascular status: blood pressure returned to baseline and stable Postop Assessment: no apparent nausea or vomiting Anesthetic complications: no   There were no known notable events for this encounter.  Last Vitals:  Vitals:   11/14/23 0930 11/14/23 0945  BP: (!) 140/76 (!) 146/83  Pulse: (!) 57 60  Resp: 12 10  Temp:  (!) 36.2 C  SpO2: 98% 98%    Last Pain:  Vitals:   11/14/23 0945  TempSrc:   PainSc: 0-No pain                 Garnette DELENA Gab

## 2023-11-14 NOTE — Interval H&P Note (Signed)
 History and Physical Interval Note:  11/14/2023 7:17 AM  Nicole [name]  has presented today for surgery, with the diagnosis of LEFT BREAST CANCER.  The various methods of treatment have been discussed with the patient and family. After consideration of risks, benefits and other options for treatment, the patient has consented to  Procedure(s): LEFT BREAST RE-EXCISION LUMPECTOMY (Left) PORT PLACEMENT WITH ULTRASOUND GUIDANCE (N/A) as a surgical intervention.  The patient's history has been reviewed, patient examined, no change in status, stable for surgery.  I have reviewed the patient's chart and labs.  Questions were answered to the patient's satisfaction.     Shalana Jardin A Skilynn Durney

## 2023-11-14 NOTE — Op Note (Signed)
 6 preoperative diagnosis: Stage II left breast cancer upper outer quadrant  Postoperative diagnosis: Same  Procedure: #1 placement of right 8 [place] internal jugular Port-A-Cath using C arm and ultrasound guidance.  #2 reexcision left breast lumpectomy  Surgeon: Debby Shipper, MD  Anesthesia: LMA with 0.25% Marcaine  with epinephrine   EBL: 30 cc  Drains: None  Specimen: Left breast lumpectomy cavity reexcision margins oriented with ink  Drains: None  Indications for procedure: The patient is a 63 year old female with multiple medical problems with left breast cancer.  She had a previous lumpectomy 2 months ago which had positive margins for DCIS.  She presents today for reexcision of all margins.  She also will need chemotherapy and a port was requested by medical oncology.  Risks and benefits of the procedure reviewed.  Risk of bleeding, infection, pneumothorax, hemothorax, injury to structures in the neck, mediastinum, chest, cardiovascular risk, anesthesia risk, exacerbation of underlying medical problems, and catheter malfunction issues.  Description of procedure: Patient was met in the holding area and questions were answered.  She was brought to the holding area and then to the operating room.  She was placed supine upon the OR table.  After induction of general esthesia, the right arm was tucked as well as the left arm.  Her neck and upper chest regions were prepped and draped in a sterile fashion timeout performed.  She was placed in Trendelenburg.  Ultrasound was used to visualize the right internal jugular vein.  A needle was introduced carefully into this with return of dark nonpulsatile blood.  A wire was fed through this easily.  C arm images showed the wire going through the superior and inferior vena cava.  There is no kinking.  A small stab incision was made at the wire insertion site.  A smaller incision was made just below the clavicle.  A cavity was made for the port.   The port was attached and then tunneled from the lower incision to the upper incision.  It was cut to 21 cm.  With the patient placed in Trendelenburg a dilator introducer complex was placed.  The cath was placed into the inner portion of this after removing the dilator.  The peel-away sheath was peeled away carefully.  The catheter was then in place.  C arm showed the tip to be at the caval atrial junction without hemo or pneumothorax.  There is no significant kinking.  This drew back and flushed easily.  5 cc of heparinized saline was placed.  The catheter tip was at the cavoatrial junction without any immediate complicating issues.  It is ready for use when she is at point of starting chemotherapy.  This was secured to the chest wall with 2-0 Prolene.  Incisions closed with 3-0 Vicryl for Monocryl.  The left breast incision was identified.  Local anesthetic infiltrated throughout the incision.  The incision was opened.  Dissection was carried down into the previous lumpectomy cavity.  All margins were excised.  They were all oriented with ink.  Once all margins were excised local anesthetic was infiltrated throughout the cavity.  Additional clips were placed.  We then closed the cavity with 3-0 Vicryl and 4 Monocryl.  Dermabond applied.  All counts were correct.  Breast binder placed.  Single view C arm  image of the catheter revealed the tip to be at the cavoatrial junction without complicating feature.  It functioned well and is ready for use  The patient was awoke extubated taken to  recovery in satisfactory condition.

## 2023-11-14 NOTE — Anesthesia Procedure Notes (Signed)
 Procedure Name: LMA Insertion Date/Time: 11/14/2023 7:47 AM  Performed by: Mannie Krystal LABOR, CRNAPre-anesthesia Checklist: Patient identified, Emergency Drugs available, Suction available, Patient being monitored and Timeout performed Patient Re-evaluated:Patient Re-evaluated prior to induction Oxygen Delivery Method: Circle system utilized and Simple face mask Preoxygenation: Pre-oxygenation with 100% oxygen Induction Type: IV induction LMA: LMA inserted LMA Size: 4.0 Number of attempts: 1 Tube secured with: Tape Dental Injury: Teeth and Oropharynx as per pre-operative assessment

## 2023-11-14 NOTE — Transfer of Care (Signed)
 Immediate Anesthesia Transfer of Care Note  Patient: Nicole [name]  Procedure(s) Performed: LEFT BREAST RE-EXCISION LUMPECTOMY (Left: Breast) PORT PLACEMENT WITH ULTRASOUND GUIDANCE (Right: Chest)  Patient Location: PACU  Anesthesia Type:General  Level of Consciousness: drowsy  Airway & Oxygen Therapy: Patient Spontanous Breathing  Post-op Assessment: Report given to RN and Post -op Vital signs reviewed and stable  Post vital signs: stable  Last Vitals:  Vitals Value Taken Time  BP 165/95 11/14/23 0907  Temp    Pulse 60 11/14/23 0911  Resp 18 11/14/23 0910  SpO2 100 % 11/14/23 0911  Vitals shown include unfiled device data.  Last Pain:  Vitals:   11/14/23 0622  TempSrc:   PainSc: 0-No pain      Patients Stated Pain Goal: 0 (11/14/23 0622)  Complications: There were no known notable events for this encounter.

## 2023-11-14 NOTE — Discharge Instructions (Signed)
 PORT-A-CATH: POST OP INSTRUCTIONS  Always review your discharge instruction sheet given to you by the facility where your surgery was performed.   A prescription for pain medication may be given to you upon discharge. Take your pain medication as prescribed, if needed. If narcotic pain medicine is not needed, then you make take acetaminophen  (Tylenol ) or ibuprofen  (Advil ) as needed.  Take your usually prescribed medications unless otherwise directed. If you need a refill on your pain medication, please contact our office. All narcotic pain medicine now requires a paper prescription.  Phoned in and fax refills are no longer allowed by law.  Prescriptions will not be filled after 5 pm or on weekends.  You should follow a light diet for the remainder of the day after your procedure. Most patients will experience some mild swelling and/or bruising in the area of the incision. It may take several days to resolve. It is common to experience some constipation if taking pain medication after surgery. Increasing fluid intake and taking a stool softener (such as Colace) will usually help or prevent this problem from occurring. A mild laxative (Milk of Magnesia or Miralax ) should be taken according to package directions if there are no bowel movements after 48 hours.  Unless discharge instructions indicate otherwise, you may remove your bandages 48 hours after surgery, and you may shower at that time. You may have steri-strips (small white skin tapes) in place directly over the incision.  These strips should be left on the skin for 7-10 days.  If your surgeon used Dermabond (skin glue) on the incision, you may shower in 24 hours.  The glue will flake off over the next 2-3 weeks.  If your port is left accessed at the end of surgery (needle left in port), the dressing cannot get wet and should only by changed by a healthcare professional. When the port is no longer accessed (when the needle has been removed),  follow step 7.   ACTIVITIES:  Limit activity involving your arms for the next 72 hours. Do no strenuous exercise or activity for 1 week. You may drive when you are no longer taking prescription pain medication, you can comfortably wear a seatbelt, and you can maneuver your car. 10.You may need to see your doctor in the office for a follow-up appointment.  Please       check with your doctor.  11.When you receive a new Port-a-Cath, you will get a product guide and        ID card.  Please keep them in case you need them.  WHEN TO CALL YOUR DOCTOR [phone]): Fever over 101.0 Chills Continued bleeding from incision Increased redness and tenderness at the site Shortness of breath, difficulty breathing   The clinic staff is available to answer your questions during regular business hours. Please don't hesitate to call and ask to speak to one of the nurses or medical assistants for clinical concerns. If you have a medical emergency, go to the nearest emergency room or call 911.  A surgeon from [facility] is always on call at the hospital.     For further information, please visit [email or web address]    [employer] Office Phone Number [phone]  BREAST BIOPSY/ PARTIAL MASTECTOMY: POST OP INSTRUCTIONS  Always review your discharge instruction sheet given to you by the facility where your surgery was performed.  IF YOU HAVE DISABILITY OR FAMILY LEAVE FORMS, YOU MUST BRING THEM TO THE OFFICE FOR PROCESSING.  DO NOT  GIVE THEM TO YOUR DOCTOR.  A prescription for pain medication may be given to you upon discharge.  Take your pain medication as prescribed, if needed.  If narcotic pain medicine is not needed, then you may take acetaminophen  (Tylenol ) or ibuprofen  (Advil ) as needed. Take your usually prescribed medications unless otherwise directed If you need a refill on your pain medication, please contact your pharmacy.  They will contact our office  to request authorization.  Prescriptions will not be filled after 5pm or on week-ends. You should eat very light the first 24 hours after surgery, such as soup, crackers, pudding, etc.  Resume your normal diet the day after surgery. Most patients will experience some swelling and bruising in the breast.  Ice packs and a good support bra will help.  Swelling and bruising can take several days to resolve.  It is common to experience some constipation if taking pain medication after surgery.  Increasing fluid intake and taking a stool softener will usually help or prevent this problem from occurring.  A mild laxative (Milk of Magnesia or Miralax ) should be taken according to package directions if there are no bowel movements after 48 hours. Unless discharge instructions indicate otherwise, you may remove your bandages 24-48 hours after surgery, and you may shower at that time.  You may have steri-strips (small skin tapes) in place directly over the incision.  These strips should be left on the skin for 7-10 days.  If your surgeon used skin glue on the incision, you may shower in 24 hours.  The glue will flake off over the next 2-3 weeks.  Any sutures or staples will be removed at the office during your follow-up visit. ACTIVITIES:  You may resume regular daily activities (gradually increasing) beginning the next day.  Wearing a good support bra or sports bra minimizes pain and swelling.  You may have sexual intercourse when it is comfortable. You may drive when you no longer are taking prescription pain medication, you can comfortably wear a seatbelt, and you can safely maneuver your car and apply brakes. RETURN TO WORK:  ______________________________________________________________________________________ Rosine should see your doctor in the office for a follow-up appointment approximately two weeks after your surgery.  Your doctor's nurse will typically make your follow-up appointment when she calls you with  your pathology report.  Expect your pathology report 2-3 business days after your surgery.  You may call to check if you do not hear from us  after three days. OTHER INSTRUCTIONS: _______________________________________________________________________________________________ _____________________________________________________________________________________________________________________________________ _____________________________________________________________________________________________________________________________________ _____________________________________________________________________________________________________________________________________  WHEN TO CALL YOUR DOCTOR: Fever over 101.0 Nausea and/or vomiting. Extreme swelling or bruising. Continued bleeding from incision. Increased pain, redness, or drainage from the incision.  The clinic staff is available to answer your questions during regular business hours.  Please don't hesitate to call and ask to speak to one of the nurses for clinical concerns.  If you have a medical emergency, go to the nearest emergency room or call 911.  A surgeon from [facility] is always on call at the hospital.  For further questions, please visit [email or web address]

## 2023-11-15 ENCOUNTER — Encounter (HOSPITAL_COMMUNITY): Payer: Self-pay | Admitting: Surgery

## 2023-11-15 LAB — SURGICAL PATHOLOGY

## 2023-11-18 ENCOUNTER — Encounter: Payer: Medicare HMO | Admitting: Podiatry

## 2023-11-18 NOTE — Progress Notes (Signed)
 Patient did not show for scheduled appointment this afternoon

## 2023-11-22 ENCOUNTER — Encounter: Payer: Self-pay | Admitting: *Deleted

## 2023-11-26 ENCOUNTER — Telehealth: Payer: Self-pay | Admitting: Genetic Counselor

## 2023-11-26 ENCOUNTER — Encounter: Payer: Self-pay | Admitting: Genetic Counselor

## 2023-11-26 DIAGNOSIS — Z1379 Encounter for other screening for genetic and chromosomal anomalies: Secondary | ICD-10-CM | POA: Insufficient documentation

## 2023-11-26 NOTE — Telephone Encounter (Signed)
 LM on VM that results are back and to please call.  Left CB instructions.

## 2023-12-02 ENCOUNTER — Ambulatory Visit: Payer: Self-pay | Admitting: Genetic Counselor

## 2023-12-02 DIAGNOSIS — Z1379 Encounter for other screening for genetic and chromosomal anomalies: Secondary | ICD-10-CM

## 2023-12-02 NOTE — Assessment & Plan Note (Signed)
-  [id number], stag I, G3, ER 20% WEAK, PR-, HER- (0), ki67 60%, with extensive DCIS -Diagnosed in October 2024 -I reviewed his surgical pathology findings, which showed DCIS, 8 mm invasive ductal carcinoma, repeated prognostic panel showed ER 20% weak positive, PR and HER2 negative with high Ki-67 60%, grade 3. -Due to the aggressive nature of her functional triple negative disease, I recommend her to consider adjuvant chemotherapy with weekly Taxol for 12 weeks -Due to the positive margin, she proceeded with re-excision surgery on 11/13/2022

## 2023-12-02 NOTE — Telephone Encounter (Signed)
Revealed negative genetic testing.  Discussed that we do not know why she has breast cancer or why there is cancer in the family. It could be due to a different gene that we are not testing, or maybe our current technology may not be able to pick something up.  It will be important for her to keep in contact with genetics to keep up with whether additional testing may be needed.   One VUS was found but this will not change medical management.

## 2023-12-02 NOTE — Progress Notes (Signed)
HPI:  [name] was previously seen in the [facility] due to a personal and family history of cancer and concerns regarding a hereditary predisposition to cancer. Please refer to our prior cancer genetics clinic note for more information regarding our discussion, assessment and recommendations, at the time. [name] recent genetic test results were disclosed to her, as were recommendations warranted by these results. These results and recommendations are discussed in more detail below.  CANCER HISTORY:  Oncology History Overview Note   Cancer Staging  Malignant neoplasm of upper-outer quadrant of left breast in female, estrogen receptor positive (HCC) Staging form: Breast, AJCC 8th Edition - Clinical stage from 08/02/2023: Stage IB (cT1a, cN0, cM0, G3, ER+, PR-, HER2-) - Signed by Malachy Mood, MD on 08/15/2023 Stage prefix: Initial diagnosis Histologic grading system: 3 grade system     Malignant neoplasm of upper-outer quadrant of left breast in female, estrogen receptor positive (HCC)  07/03/2023 Mammogram    IMPRESSION: 1. Further evaluation is suggested for possible mass in the right breast. 2. Further evaluation is suggested for asymmetry with calcifications in the left breast.   07/23/2023 Mammogram   [facility]    IMPRESSION: 1. Highly suspicious pleomorphic left breast calcifications spanning 10 cm centered on the upper outer quadrant, with no associated mass or distortion. 2. Benign skin lesion in the far medial right breast corresponds to the mass noted on the current screening exam. 3. Bilateral and symmetric prominent axillary lymph nodes. Patient has a history of HIV as well as hepatitis C. Nodes are consistent with reactive nodes of HIV.     08/02/2023 Cancer Staging   Staging form: Breast, AJCC 8th Edition - Clinical stage from 08/02/2023: Stage IB (cT1a, cN0, cM0, G3, ER+, PR-, HER2-) - Signed by Malachy Mood, MD on  08/15/2023 Stage prefix: Initial diagnosis Histologic grading system: 3 grade system   08/02/2023 Pathology Results    FINAL DIAGNOSIS       1. Breast, left, needle core biopsy, calcifications, upper inner :      INVASIVE DUCTAL CARCINOMA      DUCTAL CARCINOMA IN SITU, EXTENSIVE, CRIBRIFORM/SOLID TYPES, HIGH NUCLEAR GRADE      TUBULE FORMATION: SCORE 3      NUCLEAR PLEOMORPHISM: SCORE 3      MITOTIC COUNT: SCORE 2      TOTAL SCORE: 8      OVERALL GRADE: 3      LYMPHOVASCULAR INVASION: NOT IDENTIFIED      CANCER LENGTH: MULTIFOCI WITH 2 MM IN LARGEST LINER LENGTH      CALCIFICATIONS: PRESENT      OTHER FINDINGS: NONE      SEE NOTE       2. Breast, left, needle core biopsy, calcifications, upper outer :      DUCTAL CARCINOMA IN SITU, EXTENSIVE, CRIBRIFORM/SOLID TYPES, HIGH NUCLEAR GRADE      SUSPICIOUS FOR MICROINVASION      NECROSIS: PRESENT      CALCIFICATIONS: PRESENT      DCIS LENGTH: 2.0 CM    08/15/2023 Initial Diagnosis   Malignant neoplasm of upper-outer quadrant of left breast in female, estrogen receptor positive (HCC)   09/04/2023 Cancer Staging   Staging form: Breast, AJCC 8th Edition - Pathologic stage from 09/04/2023: Stage Unknown (pT1b, pNX, cM0, G3, ER+, PR-, HER2-) - Signed by Malachy Mood, MD on 09/24/2023 Histologic grading system: 3 grade system Residual tumor (R): R0 - None     FAMILY  HISTORY:  We obtained a detailed, 4-generation family history.  Significant diagnoses are listed below: Family History  Problem Relation Age of Onset   Diabetes Mother    Hypertension Mother    Prostate cancer Father 4 - 66   Hypertension Sister    Diabetes Sister    Prostate cancer Brother 63 - 47   Prostate cancer Brother 52 - 32   Stomach cancer Cousin        paternal first cousin   Colon cancer Neg Hx    Esophageal cancer Neg Hx    Rectal cancer Neg Hx        [name] has 5 brothers, one brother was diagnosed with prostate cancer in his 29s (he is living)  and a second brother was diagnosed with prostate cancer in his 43s (he died at age 46 due to heart failure). Her father was diagnosed with prostate cancer in his 64s, he died in his 68s. [name] reports their prostate cancer did not metastasize. She reports a few paternal aunts/uncles had a history of unknown types of cancers and one female paternal first cousin died due to stomach cancer. [name] is unaware of previous family history of genetic testing for hereditary cancer risks. There is no reported Ashkenazi Jewish ancestry  GENETIC TEST RESULTS: Genetic testing reported out on November 25, 2023 through the [employer]-cancer+RNA[employer] cancer panel found no pathogenic mutations. The [facility]Next+RNA[facility]ight Panel (CancerNext+ CTNNA1) includes sequencing, rearrangement analysis, and RNA analysis for the following 40 genes: APC, ATM, BAP1, BARD1, BMPR1A, BRCA1, BRCA2, BRIP1, CDH1, CDKN2A, CHEK2, CTNNA1, FH, FLCN, MET, MLH1, MSH2, MSH6, MUTYH, NF1, NTHL1, PALB2, PMS2, PTEN, RAD51C, RAD51D, SMAD4, STK11, TP53, TSC1, TSC2, and VHL (sequencing and deletion/duplication); AXIN2, HOXB13, MBD4, MSH3, POLD1 and POLE (sequencing only); EPCAM and GREM1 (deletion/duplication only). The test report has been scanned into EPIC and is located under the Molecular Pathology section of the Results Review tab.  A portion of the result report is included below for reference.     We discussed with [name] that because current genetic testing is not perfect, it is possible there may be a gene mutation in one of these genes that current testing cannot detect, but that chance is small.  We also discussed, that there could be another gene that has not yet been discovered, or that we have not yet tested, that is responsible for the cancer diagnoses in the family. It is also possible there is a hereditary cause for the cancer in the family that [name] did not inherit and therefore was not identified in her testing.  Therefore,  it is important to remain in touch with cancer genetics in the future so that we can continue to offer [name] the most up to date genetic testing.   Genetic testing did identify a variant of uncertain significance (VUS) was identified in the BRCA2 gene called p.[id number] (c.[id number]).  At this time, it is unknown if this variant is associated with increased cancer risk or if this is a normal finding, but most variants such as this get reclassified to being inconsequential. It should not be used to make medical management decisions. With time, we suspect the lab will determine the significance of this variant, if any. If we do learn more about it, we will try to contact [name] to discuss it further. However, it is important to stay in touch with [place] periodically and keep the address and phone number up to date.   ADDITIONAL GENETIC  TESTING: We discussed with [name] that there are other genes that are associated with increased cancer risk that can be analyzed. Should [name] wish to pursue additional genetic testing, we are happy to discuss and coordinate this testing, at any time.    CANCER SCREENING RECOMMENDATIONS: [name] test result is considered negative (normal).  This means that we have not identified a hereditary cause for her personal and family history of cancer at this time. Most cancers happen by chance and this negative test suggests that her personal and family history of cancer may fall into this category.    Possible reasons for [name]'s negative genetic test include:  1. There may be a gene mutation in one of these genes that current testing methods cannot detect but that chance is small.  2. There could be another gene that has not yet been discovered, or that we have not yet tested, that is responsible for the cancer diagnoses in the family.  3.  There may be no hereditary risk for cancer in the family. The cancers in [name] and/or her family may be sporadic/familial or due to  other genetic and environmental factors. 4. It is also possible there is a hereditary cause for the cancer in the family that [name] did not inherit.  Therefore, it is recommended she continue to follow the cancer management and screening guidelines provided by her oncology and primary healthcare provider. An individual's cancer risk and medical management are not determined by genetic test results alone. Overall cancer risk assessment incorporates additional factors, including personal medical history, family history, and any available genetic information that may result in a personalized plan for cancer prevention and surveillance  RECOMMENDATIONS FOR FAMILY MEMBERS:   Since she did not inherit a identifiable mutation in a cancer predisposition gene included on this panel, her children could not have inherited a known mutation from her in one of these genes. Individuals in this family might be at some increased risk of developing cancer, over the general population risk, simply due to the family history of cancer.  We recommended women in this family have a yearly mammogram beginning at age 75, or 82 years younger than the earliest onset of cancer, an annual clinical breast exam, and perform monthly breast self-exams. Women in this family should also have a gynecological exam as recommended by their primary provider. All family members should be referred for colonoscopy starting at age 23, or 47 years younger than the earliest onset of cancer.  FOLLOW-UP: Lastly, we discussed with Ms. Warburton that cancer genetics is a rapidly advancing field and it is possible that new genetic tests will be appropriate for her and/or her family members in the future. We encouraged her to remain in contact with cancer genetics on an annual basis so we can update her personal and family histories and let her know of advances in cancer genetics that may benefit this family.   Our contact number was provided. Ms. Mactaggart  questions were answered to her satisfaction, and she knows she is welcome to call us at anytime with additional questions or concerns.   Maylon Cos, MS, [facility] Licensed, Certified Genetic Counselor [email or web address]

## 2023-12-03 ENCOUNTER — Inpatient Hospital Stay: Payer: 59

## 2023-12-03 ENCOUNTER — Inpatient Hospital Stay: Payer: 59 | Attending: Hematology | Admitting: Hematology

## 2023-12-03 ENCOUNTER — Encounter: Payer: Self-pay | Admitting: Hematology

## 2023-12-03 VITALS — BP 161/87 | HR 80 | Temp 98.5°F | Resp 18 | Wt 220.1 lb

## 2023-12-03 DIAGNOSIS — E1165 Type 2 diabetes mellitus with hyperglycemia: Secondary | ICD-10-CM | POA: Insufficient documentation

## 2023-12-03 DIAGNOSIS — I10 Essential (primary) hypertension: Secondary | ICD-10-CM | POA: Diagnosis not present

## 2023-12-03 DIAGNOSIS — Z1722 Progesterone receptor negative status: Secondary | ICD-10-CM | POA: Diagnosis not present

## 2023-12-03 DIAGNOSIS — Z90710 Acquired absence of both cervix and uterus: Secondary | ICD-10-CM | POA: Insufficient documentation

## 2023-12-03 DIAGNOSIS — Z21 Asymptomatic human immunodeficiency virus [HIV] infection status: Secondary | ICD-10-CM | POA: Insufficient documentation

## 2023-12-03 DIAGNOSIS — F419 Anxiety disorder, unspecified: Secondary | ICD-10-CM | POA: Insufficient documentation

## 2023-12-03 DIAGNOSIS — Z8673 Personal history of transient ischemic attack (TIA), and cerebral infarction without residual deficits: Secondary | ICD-10-CM | POA: Insufficient documentation

## 2023-12-03 DIAGNOSIS — Z5111 Encounter for antineoplastic chemotherapy: Secondary | ICD-10-CM | POA: Insufficient documentation

## 2023-12-03 DIAGNOSIS — M1611 Unilateral primary osteoarthritis, right hip: Secondary | ICD-10-CM | POA: Insufficient documentation

## 2023-12-03 DIAGNOSIS — Z5189 Encounter for other specified aftercare: Secondary | ICD-10-CM | POA: Insufficient documentation

## 2023-12-03 DIAGNOSIS — C50412 Malignant neoplasm of upper-outer quadrant of left female breast: Secondary | ICD-10-CM | POA: Insufficient documentation

## 2023-12-03 DIAGNOSIS — N6489 Other specified disorders of breast: Secondary | ICD-10-CM | POA: Insufficient documentation

## 2023-12-03 DIAGNOSIS — Z881 Allergy status to other antibiotic agents status: Secondary | ICD-10-CM | POA: Diagnosis not present

## 2023-12-03 DIAGNOSIS — Z170 Estrogen receptor positive status [ER+]: Secondary | ICD-10-CM | POA: Diagnosis not present

## 2023-12-03 DIAGNOSIS — K219 Gastro-esophageal reflux disease without esophagitis: Secondary | ICD-10-CM | POA: Diagnosis not present

## 2023-12-03 DIAGNOSIS — Z79633 Long term (current) use of mitotic inhibitor: Secondary | ICD-10-CM | POA: Diagnosis not present

## 2023-12-03 DIAGNOSIS — E785 Hyperlipidemia, unspecified: Secondary | ICD-10-CM | POA: Diagnosis not present

## 2023-12-03 DIAGNOSIS — Z79899 Other long term (current) drug therapy: Secondary | ICD-10-CM | POA: Insufficient documentation

## 2023-12-03 DIAGNOSIS — Z7952 Long term (current) use of systemic steroids: Secondary | ICD-10-CM | POA: Insufficient documentation

## 2023-12-03 DIAGNOSIS — Z79630 Long term (current) use of alkylating agent: Secondary | ICD-10-CM | POA: Insufficient documentation

## 2023-12-03 LAB — COMPREHENSIVE METABOLIC PANEL
ALT: 16 U/L (ref 0–44)
AST: 26 U/L (ref 15–41)
Albumin: 3.7 g/dL (ref 3.5–5.0)
Alkaline Phosphatase: 72 U/L (ref 38–126)
Anion gap: 7 (ref 5–15)
BUN: 8 mg/dL (ref 8–23)
CO2: 29 mmol/L (ref 22–32)
Calcium: 9.1 mg/dL (ref 8.9–10.3)
Chloride: 105 mmol/L (ref 98–111)
Creatinine, Ser: 0.52 mg/dL (ref 0.44–1.00)
GFR, Estimated: >60 mL/min (ref >60)
Glucose, Bld: 163 mg/dL — ABNORMAL HIGH (ref 70–99)
Potassium: 3.1 mmol/L — ABNORMAL LOW (ref 3.5–5.1)
Sodium: 141 mmol/L (ref 135–145)
Total Bilirubin: 0.4 mg/dL (ref 0.0–1.2)
Total Protein: 8 g/dL (ref 6.5–8.1)

## 2023-12-03 LAB — CBC WITH DIFFERENTIAL/PLATELET
Abs Immature Granulocytes: 0.01 10*3/uL (ref 0.00–0.07)
Basophils Absolute: 0 10*3/uL (ref 0.0–0.1)
Basophils Relative: 0 %
Eosinophils Absolute: 0.1 10*3/uL (ref 0.0–0.5)
Eosinophils Relative: 3 %
HCT: 41.5 % (ref 36.0–46.0)
Hemoglobin: 14.1 g/dL (ref 12.0–15.0)
Immature Granulocytes: 0 %
Lymphocytes Relative: 36 %
Lymphs Abs: 1 10*3/uL (ref 0.7–4.0)
MCH: 29.6 pg (ref 26.0–34.0)
MCHC: 34 g/dL (ref 30.0–36.0)
MCV: 87.2 fL (ref 80.0–100.0)
Monocytes Absolute: 0.4 10*3/uL (ref 0.1–1.0)
Monocytes Relative: 14 %
Neutro Abs: 1.3 10*3/uL — ABNORMAL LOW (ref 1.7–7.7)
Neutrophils Relative %: 47 %
Platelets: 147 10*3/uL — ABNORMAL LOW (ref 150–400)
RBC: 4.76 MIL/uL (ref 3.87–5.11)
RDW: 13.2 % (ref 11.5–15.5)
WBC: 2.8 10*3/uL — ABNORMAL LOW (ref 4.0–10.5)
nRBC: 0 % (ref 0.0–0.2)
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
## 2023-12-03 NOTE — Progress Notes (Signed)
START ON PATHWAY REGIMEN - Breast     A cycle is every 21 days:     Cyclophosphamide      Docetaxel   **Always confirm dose/schedule in your pharmacy ordering system**  Patient Characteristics: Postoperative without Neoadjuvant Therapy, M0 (Pathologic Staging), Invasive Disease, Adjuvant Therapy, HER2 Negative, ER Positive, Node Negative, pT1a, pN55mi or pT1b-c, pN0/N24mi, Genomic Testing Not Performed, Chemotherapy Preferred Therapeutic Status: Postoperative without Neoadjuvant Therapy, M0 (Pathologic Staging) AJCC Grade: G3 AJCC N Category: pN0 AJCC M Category: cM0 ER Status: Positive (+) AJCC 8 Stage Grouping: IA HER2 Status: Negative (-) Oncotype Dx Recurrence Score: Not Appropriate AJCC T Category: pT1c PR Status: Negative (-) Has this patient completed genomic testing<= No - Did Not Order Test  Treatment Preferred: Chemotherapy Intent of Therapy: Curative Intent, Discussed with Patient

## 2023-12-04 ENCOUNTER — Telehealth: Payer: Self-pay | Admitting: *Deleted

## 2023-12-04 ENCOUNTER — Other Ambulatory Visit: Payer: Self-pay

## 2023-12-04 ENCOUNTER — Encounter: Payer: Self-pay | Admitting: *Deleted

## 2023-12-04 ENCOUNTER — Other Ambulatory Visit: Payer: Self-pay | Admitting: Hematology

## 2023-12-04 ENCOUNTER — Telehealth: Payer: Self-pay

## 2023-12-04 MED ORDER — POTASSIUM CHLORIDE CRYS ER 20 MEQ PO TBCR: 20.0000 meq | EXTENDED_RELEASE_TABLET | Freq: Every day | ORAL | 0 refills | Status: DC

## 2023-12-04 NOTE — Progress Notes (Signed)
Complex Care Management Care Guide Note  12/04/2023 Name: Nicole [name] MRN: [id number] DOB: September 12, 1961  Nicole [name] is a 63 y.o. year old female who is a primary care patient of [name], MD and is actively engaged with the care management team. I reached out to [name] by phone today to assist with re-scheduling  with the RN Case Manager.  Follow up plan: Unsuccessful telephone outreach attempt made. A HIPAA compliant phone message was left for the patient providing contact information and requesting a return call.  Gwenevere Ghazi  [facility]  [facility], [facility] Guide  Direct Dial: [phone]  Fax [phone]

## 2023-12-04 NOTE — Telephone Encounter (Addendum)
Called patient to relay message below as per Dr. Mosetta Putt. Patient voiced full understanding had no questions or concerns at this time. Call in prescription to the patients pharmacy.   ----- Message from Malachy Mood sent at 12/04/2023  6:59 AM EST ----- Please let pt know her K is low, and call in KCL daily for a week, thanks   Malachy Mood

## 2023-12-04 NOTE — Progress Notes (Signed)

## 2023-12-04 NOTE — Progress Notes (Signed)
Complex Care Management Care Guide Note  12/04/2023 Name: Nicole [name] MRN: [id number] DOB: 10-Nov-1961  Nicole [name] is a 63 y.o. year old female who is a primary care patient of [name], MD and is actively engaged with the care management team. I reached out to [name] by phone today to assist with re-scheduling  with the RN Case Manager.  Follow up plan: Telephone appointment with complex care management team member scheduled for:  2/25  Gwenevere Ghazi  [facility]  [facility], [facility] Guide  Direct Dial: [phone]  Fax [phone]

## 2023-12-05 ENCOUNTER — Other Ambulatory Visit: Payer: Self-pay

## 2023-12-09 ENCOUNTER — Other Ambulatory Visit: Payer: Self-pay | Admitting: Student

## 2023-12-09 ENCOUNTER — Inpatient Hospital Stay: Payer: 59 | Admitting: Pharmacist

## 2023-12-09 ENCOUNTER — Other Ambulatory Visit: Payer: Self-pay | Admitting: Hematology

## 2023-12-09 ENCOUNTER — Other Ambulatory Visit: Payer: 59

## 2023-12-09 ENCOUNTER — Inpatient Hospital Stay: Payer: 59

## 2023-12-09 DIAGNOSIS — Z79630 Long term (current) use of alkylating agent: Secondary | ICD-10-CM | POA: Diagnosis not present

## 2023-12-09 DIAGNOSIS — N6489 Other specified disorders of breast: Secondary | ICD-10-CM | POA: Diagnosis not present

## 2023-12-09 DIAGNOSIS — Z79633 Long term (current) use of mitotic inhibitor: Secondary | ICD-10-CM | POA: Diagnosis not present

## 2023-12-09 DIAGNOSIS — Z170 Estrogen receptor positive status [ER+]: Secondary | ICD-10-CM | POA: Diagnosis not present

## 2023-12-09 DIAGNOSIS — Z5111 Encounter for antineoplastic chemotherapy: Secondary | ICD-10-CM | POA: Diagnosis not present

## 2023-12-09 DIAGNOSIS — Z8673 Personal history of transient ischemic attack (TIA), and cerebral infarction without residual deficits: Secondary | ICD-10-CM | POA: Diagnosis not present

## 2023-12-09 DIAGNOSIS — K219 Gastro-esophageal reflux disease without esophagitis: Secondary | ICD-10-CM | POA: Diagnosis not present

## 2023-12-09 DIAGNOSIS — Z5189 Encounter for other specified aftercare: Secondary | ICD-10-CM | POA: Diagnosis not present

## 2023-12-09 DIAGNOSIS — Z7952 Long term (current) use of systemic steroids: Secondary | ICD-10-CM | POA: Diagnosis not present

## 2023-12-09 DIAGNOSIS — Z79899 Other long term (current) drug therapy: Secondary | ICD-10-CM | POA: Diagnosis not present

## 2023-12-09 DIAGNOSIS — C50412 Malignant neoplasm of upper-outer quadrant of left female breast: Secondary | ICD-10-CM | POA: Diagnosis not present

## 2023-12-09 DIAGNOSIS — E1165 Type 2 diabetes mellitus with hyperglycemia: Secondary | ICD-10-CM | POA: Diagnosis not present

## 2023-12-09 DIAGNOSIS — Z1722 Progesterone receptor negative status: Secondary | ICD-10-CM | POA: Diagnosis not present

## 2023-12-09 DIAGNOSIS — I10 Essential (primary) hypertension: Secondary | ICD-10-CM | POA: Diagnosis not present

## 2023-12-09 DIAGNOSIS — E785 Hyperlipidemia, unspecified: Secondary | ICD-10-CM | POA: Diagnosis not present

## 2023-12-09 DIAGNOSIS — Z881 Allergy status to other antibiotic agents status: Secondary | ICD-10-CM | POA: Diagnosis not present

## 2023-12-09 DIAGNOSIS — E119 Type 2 diabetes mellitus without complications: Secondary | ICD-10-CM

## 2023-12-09 DIAGNOSIS — M1611 Unilateral primary osteoarthritis, right hip: Secondary | ICD-10-CM | POA: Diagnosis not present

## 2023-12-09 MED ORDER — PROCHLORPERAZINE MALEATE 10 MG PO TABS: 10.0000 mg | ORAL_TABLET | Freq: Four times a day (QID) | ORAL | 1 refills | Status: AC | PRN

## 2023-12-09 MED ORDER — LIDOCAINE-PRILOCAINE 2.5-2.5 % EX CREA: TOPICAL_CREAM | CUTANEOUS | 3 refills | Status: AC

## 2023-12-09 MED ORDER — ONDANSETRON HCL 8 MG PO TABS: 8.0000 mg | ORAL_TABLET | Freq: Three times a day (TID) | ORAL | 1 refills | Status: AC | PRN

## 2023-12-09 MED ORDER — DEXAMETHASONE 4 MG PO TABS: ORAL_TABLET | ORAL | 1 refills | Status: DC

## 2023-12-09 NOTE — Progress Notes (Signed)
[facility]       Telephone: [phone]?Fax: [phone]   Oncology Clinical Pharmacist Practitioner Initial Assessment  Nicole [name] is a 63 y.o. female with a diagnosis of breast cancer. They were contacted today via in-person visit. She is accompanied by her sister Nicole [name]. Patient is in a wheelchair but does use a walker at home.  Indication/Regimen Docetaxel (Taxotere) and cyclophosphamide (Cytoxan) are being used appropriately for treatment of breast cancer by Dr. Malachy Mood.      Wt Readings from Last 1 Encounters:  12/03/23 220 lb 1.6 oz (99.8 kg)    Estimated body surface area is 2.16 meters squared as calculated from the following:   Height as of 11/14/23: 5\' 6"  (1.676 m).   Weight as of 12/03/23: 220 lb 1.6 oz (99.8 kg).  The dosing regimen cycle is every 21 days x 4 cycles  Docetaxel (75 mg/m2) on Day 1 Cyclophosphamide (600 mg/m2) on Day 1 Pegfilgrastim (6 mg) on Day 3  It is planned to continue until treatment plan completion or unacceptable toxicity. The tentative start date is: 12/11/23   Dose Modifications Reviewed with Dr. Mosetta Putt the recommended dose reductions of 50% for docetaxel for patients on Genvoya while also being administered She has reduced the docetaxel initially to 37.5 mg/m2.   Allergies Allergies  Allergen Reactions   Cephalexin Rash    Vitals: No vitals or labs were done today for this chemotherapy education visit     12/03/2023    2:51 PM 12/03/2023    2:48 PM 11/14/2023    9:45 AM  Oncology Vitals  Weight  99.837 kg   Weight (lbs)  220 lbs 2 oz   BMI  35.53 kg/m2   Temp  98.5 F (36.9 C) 97.2 F (36.2 C)  Pulse Rate  80 60  BP 161/87 162/83 146/83  Resp  18 10  SpO2  100 % 98 %  BSA (m2)  2.16 m2      Laboratory Data    Latest Ref Rng & Units 12/03/2023    2:12 PM 11/12/2023    1:31 PM 09/02/2023    2:26 PM  CBC EXTENDED  WBC 4.0 - 10.5 K/uL 2.8  3.2  5.3   RBC 3.87 - 5.11 MIL/uL 4.76  4.58  4.62    Hemoglobin 12.0 - 15.0 g/dL 29.5  62.1  30.8   HCT 36.0 - 46.0 % 41.5  39.4  41.0   Platelets 150 - 400 K/uL 147  130  151   NEUT# 1.7 - 7.7 K/uL 1.3  1.5    Lymph# 0.7 - 4.0 K/uL 1.0  1.3         Latest Ref Rng & Units 12/03/2023    2:12 PM 11/12/2023    1:31 PM 09/02/2023    2:26 PM  CMP  Glucose 70 - 99 mg/dL 657  846  98   BUN 8 - 23 mg/dL 8  8  8    Creatinine 0.44 - 1.00 mg/dL 9.62  9.52  8.41   Sodium 135 - 145 mmol/L 141  138  137   Potassium 3.5 - 5.1 mmol/L 3.1  3.5  3.5   Chloride 98 - 111 mmol/L 105  108  109   CO2 22 - 32 mmol/L 29  26  22    Calcium 8.9 - 10.3 mg/dL 9.1  8.9  8.8   Total Protein 6.5 - 8.1 g/dL 8.0  8.2  8.3   Total Bilirubin 0.0 -  1.2 mg/dL 0.4  0.4  0.7   Alkaline Phos 38 - 126 U/L 72  71  54   AST 15 - 41 U/L 26  23  33   ALT 0 - 44 U/L 16  16  27     Contraindications Contraindications were reviewed? Yes Contraindications to therapy were identified? No   Safety Precautions The following safety precautions were reviewed:  Fever: reviewed the importance of having a thermometer and the Centers for Disease Control and Prevention (CDC) definition of fever which is 100.51F (38C) or higher. Patient should call 24/7 triage at [phone] if experiencing a fever or any other symptoms Decreased white blood cells (WBCs) and increased risk for infection Decreased platelet count and increased risk of bleeding Decreased hemoglobin, part of the red blood cells that carry iron and oxygen Hair Loss Fatigue Fluid retention or swelling (edema) Peripheral Neuropathy Mouth sores Rash or itchy skin Muscle or joint pain or weakness Nausea or vomiting Diarrhea Nail Changes Hypersensitivity reactions Secondary Malignancies Hemorrhagic cystitis Pneumonitis Handling body fluids and waste Intimacy, sexual activity, contraception, fertility  Medication Reconciliation Current Outpatient Medications  Medication Sig Dispense Refill    elvitegravir-cobicistat-emtricitabine-tenofovir (GENVOYA) 150-150-200-10 MG TABS tablet Take 1 tablet by mouth daily with breakfast. 30 tablet 11   Accu-Chek FastClix Lancets MISC Check blood sugar one time a day 102 each 5   aspirin EC 81 MG tablet Take 81 mg by mouth daily. Swallow whole.     Blood Glucose Monitoring Suppl (ACCU-CHEK GUIDE ME) w/Device KIT 1 m by Does not apply route every morning. 1 kit 0   Blood Pressure Monitoring (BLOOD PRESSURE CUFF) MISC 1 each by Does not apply route 2 (two) times daily. 1 each 0   buPROPion (WELLBUTRIN SR) 150 MG 12 hr tablet TAKE 1 TABLET BY MOUTH EVERY DAY 90 tablet 1   dexamethasone (DECADRON) 4 MG tablet Take 2 tabs by mouth 2 times daily starting day before chemo. Then take 2 tabs daily for 2 days starting day after chemo. Take with food. (Patient not taking: Reported on 12/09/2023) 30 tablet 1   escitalopram (LEXAPRO) 20 MG tablet TAKE HALF TABLET A DAY FOR 1 WEEK, THEN START TAKING 1 FULL TABLET BY MOUTH DAILY (Patient taking differently: Take 20 mg by mouth daily.) 90 tablet 1   FARXIGA 10 MG TABS tablet TAKE 1 TABLET BY MOUTH DAILY. TAKE 1 TABLET BY MOUTH EVERY DAY BEFORE BREAKFAST STRENGTH: 10 MG 90 tablet 1   glucose blood (ACCU-CHEK GUIDE) test strip Check blood sugar one time daily 100 each 4   ibuprofen (ADVIL) 800 MG tablet Take 1 tablet (800 mg total) by mouth every 8 (eight) hours as needed. 30 tablet 0   lidocaine-prilocaine (EMLA) cream Apply to affected area once (Patient not taking: Reported on 12/09/2023) 30 g 3   Olmesartan-amLODIPine-HCTZ 40-5-12.5 MG TABS Take 1 tablet by mouth daily. 90 tablet 2   omeprazole (PRILOSEC) 20 MG capsule Take 1 capsule (20 mg total) by mouth daily. 90 capsule 1   ondansetron (ZOFRAN) 8 MG tablet Take 1 tablet (8 mg total) by mouth every 8 (eight) hours as needed for nausea or vomiting. Start on the third day after chemotherapy. (Patient not taking: Reported on 12/09/2023) 30 tablet 1   oxyCODONE (OXY  IR/ROXICODONE) 5 MG immediate release tablet Take 1 tablet (5 mg total) by mouth every 6 (six) hours as needed for severe pain (pain score 7-10). 15 tablet 0   potassium chloride SA (KLOR-CON M)  20 MEQ tablet Take 1 tablet (20 mEq total) by mouth daily. 7 tablet 0   prochlorperazine (COMPAZINE) 10 MG tablet Take 1 tablet (10 mg total) by mouth every 6 (six) hours as needed for nausea or vomiting. (Patient not taking: Reported on 12/09/2023) 30 tablet 1   rosuvastatin (CRESTOR) 20 MG tablet TAKE 1 TABLET BY MOUTH EVERY DAY 90 tablet 1   Semaglutide,0.25 or 0.5MG /DOS, 2 MG/3ML SOPN Inject 0.5 mg into the skin once a week. 3 mL 2   No current facility-administered medications for this visit.   Medication reconciliation is based on the patient's most recent medication list in the electronic medical record (EMR) including herbal products and OTC medications.   The patient's medication list was reviewed today with the patient? Yes   Drug-drug interactions (DDIs) DDIs were evaluated? Yes Significant DDIs identified?  Yes, possible interaction with Genvoya and docetaxel. Literature recommends avoiding combination or a 50% dose reduction of docetaxel if both need to continue. Dr. Mosetta Putt notified  Drug-Food Interactions Drug-food interactions were evaluated? Yes Drug-food interactions identified? No   Follow-up Plan  Treatment start date: 12/11/23 Port placement date: 11/14/23 We reviewed the prescriptions, premedications, and treatment regimen with the patient. Possible side effects of the treatment regimen were reviewed and management strategies were discussed.  Can use loperamide as needed for diarrhea, loratadine as needed for G-CSF bone pain, and Senna-S as needed for constipation.  Notified Dr. Mosetta Putt of potential interaction with elvitegravir, cobicistat, emtricitabine, and tenofovir alafenamide (Genvoya) and docetaxel. Docetaxel reduced by 50% per Dr. Mosetta Putt. Clinical pharmacy will assist Dr. Malachy Mood  and [name] on an as needed basis going forward  Nicole [name] participated in the discussion, expressed understanding, and voiced agreement with the above plan. All questions were answered to her satisfaction. The patient was advised to contact the clinic at [phone] with any questions or concerns prior to her return visit.   I spent 60 minutes assessing the patient.  Marva Hendryx A. Odetta Pink, PharmD, BCOP, CPP  Anselm Lis, RPH-CPP, 12/09/2023 4:53 PM  **Disclaimer: This note was dictated with voice recognition software. Similar sounding words can inadvertently be transcribed and this note may contain transcription errors which may not have been corrected upon publication of note.**

## 2023-12-10 ENCOUNTER — Encounter: Payer: Self-pay | Admitting: Infectious Diseases

## 2023-12-10 ENCOUNTER — Encounter: Payer: Self-pay | Admitting: Hematology

## 2023-12-10 ENCOUNTER — Other Ambulatory Visit (HOSPITAL_COMMUNITY)
Admission: RE | Admit: 2023-12-10 | Discharge: 2023-12-10 | Disposition: A | Discharge status: Home / Self Care | Payer: 59 | Source: Ambulatory Visit | Attending: Infectious Diseases | Admitting: Infectious Diseases

## 2023-12-10 ENCOUNTER — Other Ambulatory Visit: Payer: Self-pay

## 2023-12-10 ENCOUNTER — Telehealth: Payer: Self-pay

## 2023-12-10 ENCOUNTER — Ambulatory Visit (INDEPENDENT_AMBULATORY_CARE_PROVIDER_SITE_OTHER): Payer: 59 | Admitting: Infectious Diseases

## 2023-12-10 VITALS — BP 153/78 | HR 99 | Temp 98.1°F | Ht 67.0 in | Wt 221.0 lb

## 2023-12-10 DIAGNOSIS — E119 Type 2 diabetes mellitus without complications: Secondary | ICD-10-CM | POA: Diagnosis not present

## 2023-12-10 DIAGNOSIS — I10 Essential (primary) hypertension: Secondary | ICD-10-CM

## 2023-12-10 DIAGNOSIS — B20 Human immunodeficiency virus [HIV] disease: Secondary | ICD-10-CM

## 2023-12-10 DIAGNOSIS — C50412 Malignant neoplasm of upper-outer quadrant of left female breast: Secondary | ICD-10-CM | POA: Diagnosis not present

## 2023-12-10 DIAGNOSIS — Z113 Encounter for screening for infections with a predominantly sexual mode of transmission: Secondary | ICD-10-CM | POA: Diagnosis present

## 2023-12-10 DIAGNOSIS — Z170 Estrogen receptor positive status [ER+]: Secondary | ICD-10-CM

## 2023-12-10 DIAGNOSIS — E66812 Obesity, class 2: Secondary | ICD-10-CM

## 2023-12-10 DIAGNOSIS — F17200 Nicotine dependence, unspecified, uncomplicated: Secondary | ICD-10-CM

## 2023-12-10 DIAGNOSIS — Z79899 Other long term (current) drug therapy: Secondary | ICD-10-CM

## 2023-12-10 DIAGNOSIS — K219 Gastro-esophageal reflux disease without esophagitis: Secondary | ICD-10-CM

## 2023-12-10 DIAGNOSIS — Z6834 Body mass index (BMI) 34.0-34.9, adult: Secondary | ICD-10-CM

## 2023-12-10 MED ORDER — GENVOYA 150-150-200-10 MG PO TABS: 1.0000 | ORAL_TABLET | Freq: Every day | ORAL | 11 refills | Status: DC

## 2023-12-10 MED ORDER — DAPAGLIFLOZIN PROPANEDIOL 10 MG PO TABS: 10.0000 mg | ORAL_TABLET | Freq: Every day | ORAL | 3 refills | Status: DC

## 2023-12-10 MED ORDER — ASPIRIN 81 MG PO TBEC: 81.0000 mg | DELAYED_RELEASE_TABLET | Freq: Every day | ORAL | 3 refills | Status: DC

## 2023-12-10 MED ORDER — SEMAGLUTIDE(0.25 OR 0.5MG/DOS) 2 MG/3ML ~~LOC~~ SOPN: 0.5000 mg | PEN_INJECTOR | SUBCUTANEOUS | 2 refills | Status: DC

## 2023-12-10 MED ORDER — OMEPRAZOLE 20 MG PO CPDR: 20.0000 mg | DELAYED_RELEASE_CAPSULE | Freq: Every day | ORAL | 1 refills | Status: DC

## 2023-12-10 MED ORDER — BUPROPION HCL ER (SR) 150 MG PO TB12: 150.0000 mg | ORAL_TABLET | Freq: Every day | ORAL | 1 refills | Status: DC

## 2023-12-10 MED ORDER — OLMESARTAN-AMLODIPINE-HCTZ 40-5-12.5 MG PO TABS: 1.0000 | ORAL_TABLET | Freq: Every day | ORAL | 2 refills | Status: DC

## 2023-12-10 MED ORDER — ROSUVASTATIN CALCIUM 20 MG PO TABS: 20.0000 mg | ORAL_TABLET | Freq: Every day | ORAL | 1 refills | Status: DC

## 2023-12-10 NOTE — Assessment & Plan Note (Signed)
She appears to be doing well Will check her labs today as well as her A1C.  Her vax are uptodate.  Will see her back in 4 months to see how she tolerated ctx.

## 2023-12-10 NOTE — Progress Notes (Signed)
Subjective:    Patient ID: Nicole [name], female  DOB: January 19, 1961, 63 y.o.        MRN: [id number]   HPI 63 yo F with hx of HIV+ since 1980-1990. She has been on genvoya for a "long time".   No hx of HIV related hospitalizations.  She has DM2 since 80-90s.  Her FSG at home have been "pretty good". 120-130 per her sister. Occas numbness in her hands. Has brace on L leg due to "drop foot" due to accident at 3-4 yo.  Here with sister.  Denies missed meds per her sister.   Since her last visit she has been dx with breast Ca (invasive ductal 1bN0M0). She had her first surgery 08-2023 and required second 11-13-22 for port placement and margins revision.  She is going to start chemotherapy tomorrow (cyclophosphamide, docetaxol for 12 weeks).   Feels good.     HIV 1 RNA Quant  Date Value  06/05/2022 Not Detected Copies/mL  11/14/2020 <20 Copies/mL  06/04/2019 <20 NOT DETECTED copies/mL   HIV-1 RNA Viral Load (copies/mL)  Date Value  05/14/2023 4,070   CD4 T Cell Abs (/uL)  Date Value  05/14/2023 519  06/05/2022 1,067  11/14/2020 1,328     Health Maintenance  Topic Date Due   Zoster Vaccines- Shingrix (1 of 2) Never done   Lung Cancer Screening  Never done   OPHTHALMOLOGY EXAM  01/26/2020   INFLUENZA VACCINE  06/13/2023   COVID-19 Vaccine (4 - 2024-25 season) 07/14/2023   Diabetic kidney evaluation - Urine ACR  12/05/2023   HEMOGLOBIN A1C  12/03/2023   FOOT EXAM  12/05/2023   Medicare Annual Wellness (AWV)  02/12/2024   MAMMOGRAM  07/22/2024   Diabetic kidney evaluation - eGFR measurement  12/02/2024   DTaP/Tdap/Td (2 - Td or Tdap) 05/17/2027   Colonoscopy  06/01/2032   Pneumococcal Vaccine 98-5 Years old  Completed   Hepatitis C Screening  Completed   HIV Screening  Completed   HPV VACCINES  Aged Out   Flu, covid and PCV are uptodate.    Review of Systems  Constitutional:  Negative for chills and fever.  Respiratory:  Negative for shortness of breath.    Cardiovascular:  Positive for leg swelling. Negative for chest pain.  Gastrointestinal:  Negative for blood in stool and constipation.  Genitourinary:  Negative for dysuria.  Neurological:  Positive for sensory change.    Please see HPI. All other systems reviewed and negative.     Objective:  Physical Exam Vitals reviewed.  Constitutional:      Appearance: Normal appearance. She is obese.  HENT:     Mouth/Throat:     Mouth: Mucous membranes are moist.     Pharynx: No oropharyngeal exudate.  Eyes:     Extraocular Movements: Extraocular movements intact.     Pupils: Pupils are equal, round, and reactive to light.  Cardiovascular:     Rate and Rhythm: Normal rate and regular rhythm.  Pulmonary:     Effort: Pulmonary effort is normal.     Breath sounds: Normal breath sounds.  Abdominal:     General: Bowel sounds are normal. There is no distension.     Palpations: Abdomen is soft.     Tenderness: There is no abdominal tenderness.  Musculoskeletal:     Right lower leg: Edema present.     Left lower leg: Edema present.  Neurological:     General: No focal deficit present.     Mental  Status: She is alert.  Psychiatric:        Mood and Affect: Mood normal.            Assessment & Plan:

## 2023-12-10 NOTE — Telephone Encounter (Signed)
Medication sent to pharmacy

## 2023-12-10 NOTE — Telephone Encounter (Signed)
Decision:Approved  Nicole [name] (Key: [id number]) Rx #: [id number] Ozempic (0.25 or 0.5 MG/DOSE) 2MG Ronny Bacon pen-injectors Form [employer] Medicare Part D Electronic Prior Authorization Form (2017 NCPDP) Created Message from Plan Request Reference Number: [id number]. OZEMPIC INJ 2MG /3ML is approved through 11/11/2024. Your patient may now fill this prescription and it will be covered.. Authorization Expiration Date: November 11, 2024.

## 2023-12-10 NOTE — Assessment & Plan Note (Addendum)
Will get her in with [facility] for f/u I encouraged her to f/u with ophtho. Not done last year.

## 2023-12-10 NOTE — Assessment & Plan Note (Signed)
Has been taking her anti-htn rx.  Still elevated, will get her in for f/u in [facility].

## 2023-12-10 NOTE — Assessment & Plan Note (Signed)
On GLP-1 Will have her f/u with PCP

## 2023-12-10 NOTE — Assessment & Plan Note (Signed)
-  pT1bN0M0, stag I, G3, ER 20% WEAK, PR-, HER- (0), ki67 60%, with extensive DCIS -Diagnosed in October 2024 -I reviewed his surgical pathology findings, which showed DCIS, 8 mm invasive ductal carcinoma, repeated prognostic panel showed ER 20% weak positive, PR and HER2 negative with high Ki-67 60%, grade 3. -Due to the aggressive nature of her functional triple negative disease, I recommend her to consider adjuvant chemotherapy -Due to the positive margin, she proceeded with re-excision surgery on 11/13/2022 which showed additional 1cm invasive cancer, final margins were negative  -I recommend adjuvant chemo TC every 3 weeks for 4 cycles with G-CSF on day 3, she will start today 12/11/2023.  Due to drug-drug interaction with her HIV medication, I will reduce docetaxel dose by 50%.

## 2023-12-10 NOTE — Assessment & Plan Note (Signed)
Appreciate Dr Latanya Maudlin f/u.  Discussed with pt and her sister.

## 2023-12-10 NOTE — Telephone Encounter (Signed)
Prior Authorization for patient (Ozempic (0.25 or 0.5 MG/DOSE) 2MG /3ML pen-injectors) came through on cover my meds was submitted with last office notes and labs awaiting approval or denial.  WUJ:[id number]

## 2023-12-11 ENCOUNTER — Inpatient Hospital Stay: Payer: 59

## 2023-12-11 ENCOUNTER — Inpatient Hospital Stay (HOSPITAL_BASED_OUTPATIENT_CLINIC_OR_DEPARTMENT_OTHER): Payer: 59 | Admitting: Hematology

## 2023-12-11 ENCOUNTER — Encounter: Payer: Self-pay | Admitting: *Deleted

## 2023-12-11 ENCOUNTER — Encounter: Payer: Self-pay | Admitting: Hematology

## 2023-12-11 VITALS — BP 160/89 | HR 70 | Temp 98.7°F | Resp 19 | Wt 216.7 lb

## 2023-12-11 VITALS — BP 171/87 | HR 66 | Temp 97.8°F | Resp 16

## 2023-12-11 DIAGNOSIS — E1165 Type 2 diabetes mellitus with hyperglycemia: Secondary | ICD-10-CM | POA: Diagnosis not present

## 2023-12-11 DIAGNOSIS — Z170 Estrogen receptor positive status [ER+]: Secondary | ICD-10-CM | POA: Diagnosis not present

## 2023-12-11 DIAGNOSIS — K219 Gastro-esophageal reflux disease without esophagitis: Secondary | ICD-10-CM | POA: Diagnosis not present

## 2023-12-11 DIAGNOSIS — Z881 Allergy status to other antibiotic agents status: Secondary | ICD-10-CM | POA: Diagnosis not present

## 2023-12-11 DIAGNOSIS — Z5111 Encounter for antineoplastic chemotherapy: Secondary | ICD-10-CM | POA: Diagnosis not present

## 2023-12-11 DIAGNOSIS — C50412 Malignant neoplasm of upper-outer quadrant of left female breast: Secondary | ICD-10-CM | POA: Diagnosis not present

## 2023-12-11 DIAGNOSIS — N6489 Other specified disorders of breast: Secondary | ICD-10-CM | POA: Diagnosis not present

## 2023-12-11 DIAGNOSIS — Z79633 Long term (current) use of mitotic inhibitor: Secondary | ICD-10-CM | POA: Diagnosis not present

## 2023-12-11 DIAGNOSIS — Z95828 Presence of other vascular implants and grafts: Secondary | ICD-10-CM | POA: Insufficient documentation

## 2023-12-11 DIAGNOSIS — Z7952 Long term (current) use of systemic steroids: Secondary | ICD-10-CM | POA: Diagnosis not present

## 2023-12-11 DIAGNOSIS — E785 Hyperlipidemia, unspecified: Secondary | ICD-10-CM | POA: Diagnosis not present

## 2023-12-11 DIAGNOSIS — I10 Essential (primary) hypertension: Secondary | ICD-10-CM | POA: Diagnosis not present

## 2023-12-11 DIAGNOSIS — M1611 Unilateral primary osteoarthritis, right hip: Secondary | ICD-10-CM | POA: Diagnosis not present

## 2023-12-11 DIAGNOSIS — Z79630 Long term (current) use of alkylating agent: Secondary | ICD-10-CM | POA: Diagnosis not present

## 2023-12-11 DIAGNOSIS — Z5189 Encounter for other specified aftercare: Secondary | ICD-10-CM | POA: Diagnosis not present

## 2023-12-11 DIAGNOSIS — Z79899 Other long term (current) drug therapy: Secondary | ICD-10-CM | POA: Diagnosis not present

## 2023-12-11 DIAGNOSIS — Z1722 Progesterone receptor negative status: Secondary | ICD-10-CM | POA: Diagnosis not present

## 2023-12-11 DIAGNOSIS — Z8673 Personal history of transient ischemic attack (TIA), and cerebral infarction without residual deficits: Secondary | ICD-10-CM | POA: Diagnosis not present

## 2023-12-11 LAB — CMP (CANCER CENTER ONLY)
ALT: 14 U/L (ref 0–44)
AST: 18 U/L (ref 15–41)
Albumin: 3.7 g/dL (ref 3.5–5.0)
Alkaline Phosphatase: 71 U/L (ref 38–126)
Anion gap: 6 (ref 5–15)
BUN: 10 mg/dL (ref 8–23)
CO2: 24 mmol/L (ref 22–32)
Calcium: 9.5 mg/dL (ref 8.9–10.3)
Chloride: 111 mmol/L (ref 98–111)
Creatinine: 0.48 mg/dL (ref 0.44–1.00)
GFR, Estimated: >60 mL/min (ref >60)
Glucose, Bld: 200 mg/dL — ABNORMAL HIGH (ref 70–99)
Potassium: 3.7 mmol/L (ref 3.5–5.1)
Sodium: 141 mmol/L (ref 135–145)
Total Bilirubin: 0.4 mg/dL (ref 0.0–1.2)
Total Protein: 8.4 g/dL — ABNORMAL HIGH (ref 6.5–8.1)

## 2023-12-11 LAB — URINE CYTOLOGY ANCILLARY ONLY
Chlamydia: NEGATIVE
Comment: NEGATIVE
Comment: NORMAL
Neisseria Gonorrhea: NEGATIVE

## 2023-12-11 LAB — CBC WITH DIFFERENTIAL (CANCER CENTER ONLY)
Abs Immature Granulocytes: 0.03 10*3/uL (ref 0.00–0.07)
Basophils Absolute: 0 10*3/uL (ref 0.0–0.1)
Basophils Relative: 0 %
Eosinophils Absolute: 0 10*3/uL (ref 0.0–0.5)
Eosinophils Relative: 0 %
HCT: 40.3 % (ref 36.0–46.0)
Hemoglobin: 13.8 g/dL (ref 12.0–15.0)
Immature Granulocytes: 1 %
Lymphocytes Relative: 17 %
Lymphs Abs: 0.7 10*3/uL (ref 0.7–4.0)
MCH: 29.3 pg (ref 26.0–34.0)
MCHC: 34.2 g/dL (ref 30.0–36.0)
MCV: 85.6 fL (ref 80.0–100.0)
Monocytes Absolute: 0.3 10*3/uL (ref 0.1–1.0)
Monocytes Relative: 7 %
Neutro Abs: 3.1 10*3/uL (ref 1.7–7.7)
Neutrophils Relative %: 75 %
Platelet Count: 160 10*3/uL (ref 150–400)
RBC: 4.71 MIL/uL (ref 3.87–5.11)
RDW: 13.1 % (ref 11.5–15.5)
WBC Count: 4.1 10*3/uL (ref 4.0–10.5)
nRBC: 0 % (ref 0.0–0.2)

## 2023-12-11 LAB — LIPID PANEL
Chol/HDL Ratio: 3.1 {ratio} (ref 0.0–4.4)
Cholesterol, Total: 118 mg/dL (ref 100–199)
HDL: 38 mg/dL — ABNORMAL LOW (ref >39)
LDL Chol Calc (NIH): 59 mg/dL (ref 0–99)
Triglycerides: 118 mg/dL (ref 0–149)
VLDL Cholesterol Cal: 21 mg/dL (ref 5–40)

## 2023-12-11 LAB — HEMOGLOBIN A1C
Est. average glucose Bld gHb Est-mCnc: 128 mg/dL
Hgb A1c MFr Bld: 6.1 % — ABNORMAL HIGH (ref 4.8–5.6)

## 2023-12-11 LAB — T-HELPER CELLS (CD4) COUNT (NOT AT ARMC)
CD4 % Helper T Cell: 34 % (ref 33–65)
CD4 T Cell Abs: 357 /uL — ABNORMAL LOW (ref 400–1790)

## 2023-12-11 LAB — HIV-1 RNA QUANT-NO REFLEX-BLD
HIV-1 RNA Viral Load Log: 4.694 {Log}
HIV-1 RNA Viral Load: 49400 {copies}/mL

## 2023-12-11 LAB — RPR: RPR Ser Ql: NONREACTIVE

## 2023-12-11 MED ORDER — CYCLOPHOSPHAMIDE CHEMO INJECTION 1 GM
600.0000 mg/m2 | Freq: Once | INTRAMUSCULAR | Status: AC
  Administered 2023-12-11: 1300 mg via INTRAVENOUS
  Filled 2023-12-11: qty 64.03

## 2023-12-11 MED ORDER — SODIUM CHLORIDE 0.9% FLUSH
10.0000 mL | Freq: Once | INTRAVENOUS | Status: AC
  Administered 2023-12-11: 10 mL

## 2023-12-11 MED ORDER — HEPARIN SOD (PORK) LOCK FLUSH 100 UNIT/ML IV SOLN
500.0000 [IU] | Freq: Once | INTRAVENOUS | Status: AC | PRN
  Administered 2023-12-11: 500 [IU]

## 2023-12-11 MED ORDER — SODIUM CHLORIDE 0.9% FLUSH
10.0000 mL | INTRAVENOUS | Status: DC | PRN
  Administered 2023-12-11: 10 mL

## 2023-12-11 MED ORDER — SODIUM CHLORIDE 0.9 % IV SOLN
37.5000 mg/m2 | Freq: Once | INTRAVENOUS | Status: AC
  Administered 2023-12-11: 80 mg via INTRAVENOUS
  Filled 2023-12-11: qty 8

## 2023-12-11 MED ORDER — SODIUM CHLORIDE 0.9 % IV SOLN: INTRAVENOUS | Status: DC

## 2023-12-11 MED ORDER — PALONOSETRON HCL INJECTION 0.25 MG/5ML
0.2500 mg | Freq: Once | INTRAVENOUS | Status: AC
Start: 2023-12-11 — End: 2023-12-11
  Administered 2023-12-11: 0.25 mg via INTRAVENOUS
  Filled 2023-12-11: qty 5

## 2023-12-11 MED ORDER — DEXAMETHASONE SODIUM PHOSPHATE 10 MG/ML IJ SOLN
10.0000 mg | Freq: Once | INTRAMUSCULAR | Status: AC
  Administered 2023-12-11: 10 mg via INTRAVENOUS
  Filled 2023-12-11: qty 1
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
## 2023-12-11 NOTE — Patient Instructions (Signed)
[facility] - A DEPT OF [facility]  Discharge Instructions: Thank you for choosing [facility] to provide your oncology and hematology care.   If you have a lab appointment with the Cancer Center, please go directly to the Cancer Center and check in at the registration area.   Wear comfortable clothing and clothing appropriate for easy access to any Portacath or PICC line.   We strive to give you quality time with your provider. You may need to reschedule your appointment if you arrive late (15 or more minutes).  Arriving late affects you and other patients whose appointments are after yours.  Also, if you miss three or more appointments without notifying the office, you may be dismissed from the clinic at the provider's discretion.      For prescription refill requests, have your pharmacy contact our office and allow 72 hours for refills to be completed.    Today you received the following chemotherapy and/or immunotherapy agents: Docetaxel, cytoxan      To help prevent nausea and vomiting after your treatment, we encourage you to take your nausea medication as directed.  BELOW ARE SYMPTOMS THAT SHOULD BE REPORTED IMMEDIATELY: *FEVER GREATER THAN 100.4 F (38 C) OR HIGHER *CHILLS OR SWEATING *NAUSEA AND VOMITING THAT IS NOT CONTROLLED WITH YOUR NAUSEA MEDICATION *UNUSUAL SHORTNESS OF BREATH *UNUSUAL BRUISING OR BLEEDING *URINARY PROBLEMS (pain or burning when urinating, or frequent urination) *BOWEL PROBLEMS (unusual diarrhea, constipation, pain near the anus) TENDERNESS IN MOUTH AND THROAT WITH OR WITHOUT PRESENCE OF ULCERS (sore throat, sores in mouth, or a toothache) UNUSUAL RASH, SWELLING OR PAIN  UNUSUAL VAGINAL DISCHARGE OR ITCHING   Items with * indicate a potential emergency and should be followed up as soon as possible or go to the Emergency Department if any problems should occur.  Please show the CHEMOTHERAPY ALERT CARD or  IMMUNOTHERAPY ALERT CARD at check-in to the Emergency Department and triage nurse.  Should you have questions after your visit or need to cancel or reschedule your appointment, please contact [facility] - A DEPT OF [facility]  Dept: [phone]  and follow the prompts.  Office hours are 8:00 a.m. to 4:30 p.m. Monday - Friday. Please note that voicemails left after 4:00 p.m. may not be returned until the following business day.  We are closed weekends and major holidays. You have access to a nurse at all times for urgent questions. Please call the main number to the clinic Dept: [phone] and follow the prompts.   For any non-urgent questions, you may also contact your provider using MyChart. We now offer e-Visits for anyone 67 and older to request care online for non-urgent symptoms. For details visit [email or web address].   Also download the MyChart app! Go to the app store, search "MyChart", open the app, select [facility], and log in with your MyChart username and password.  Docetaxel Injection What is this medication? DOCETAXEL (doe se TAX el) treats some types of cancer. It works by slowing down the growth of cancer cells. This medicine may be used for other purposes; ask your health care provider or pharmacist if you have questions. COMMON BRAND NAME(S): BEIZRAY, Docefrez, Docivyx, Taxotere What should I tell my care team before I take this medication? They need to know if you have any of these conditions: Kidney disease Liver disease Low white blood cell levels Tingling of the fingers or toes or  other nerve disorder An unusual or allergic reaction to docetaxel, polysorbate 80, other medications, foods, dyes, or preservatives Pregnant or trying to get pregnant Breast-feeding How should I use this medication? This medication is injected into a vein. It is given by your care team in a hospital or clinic setting. Talk to your care team about the  use of this medication in children. Special care may be needed. Overdosage: If you think you have taken too much of this medicine contact a poison control center or emergency room at once. NOTE: This medicine is only for you. Do not share this medicine with others. What if I miss a dose? Keep appointments for follow-up doses. It is important not to miss your dose. Call your care team if you are unable to keep an appointment. What may interact with this medication? Do not take this medication with any of the following: Live virus vaccines This medication may also interact with the following: Certain antibiotics, such as clarithromycin, telithromycin Certain antivirals for HIV or hepatitis Certain medications for fungal infections, such as itraconazole, ketoconazole, voriconazole Grapefruit juice Nefazodone Supplements, such as St. John's wort This list may not describe all possible interactions. Give your health care provider a list of all the medicines, herbs, non-prescription drugs, or dietary supplements you use. Also tell them if you smoke, drink alcohol, or use illegal drugs. Some items may interact with your medicine. What should I watch for while using this medication? This medication may make you feel generally unwell. This is not uncommon as chemotherapy can affect healthy cells as well as cancer cells. Report any side effects. Continue your course of treatment even though you feel ill unless your care team tells you to stop. You may need blood work done while you are taking this medication. This medication can cause serious side effects and infusion reactions. To reduce the risk, your care team may give you other medications to take before receiving this one. Be sure to follow the directions from your care team. This medication may increase your risk of getting an infection. Call your care team for advice if you get a fever, chills, sore throat, or other symptoms of a cold or flu. Do not  treat yourself. Try to avoid being around people who are sick. Avoid taking medications that contain aspirin, acetaminophen, ibuprofen, naproxen, or ketoprofen unless instructed by your care team. These medications may hide a fever. Be careful brushing or flossing your teeth or using a toothpick because you may get an infection or bleed more easily. If you have any dental work done, tell your dentist you are receiving this medication. Some products may contain alcohol. Ask your care team if this medication contains alcohol. Be sure to tell all care teams you are taking this medicine. Certain medications, like metronidazole and disulfiram, can cause an unpleasant reaction when taken with alcohol. The reaction includes flushing, headache, nausea, vomiting, sweating, and increased thirst. The reaction can last from 30 minutes to several hours. This medication may affect your coordination, reaction time, or judgement. Do not drive or operate machinery until you know how this medication affects you. Sit up or stand slowly to reduce the risk of dizzy or fainting spells. Drinking alcohol with this medication can increase the risk of these side effects. Talk to your care team about your risk of cancer. You may be more at risk for certain types of cancer if you take this medication. Talk to your care team if you wish to become pregnant  or think you might be pregnant. This medication can cause serious birth defects if taken during pregnancy or if you get pregnant within 2 months after stopping therapy. A negative pregnancy test is required before starting this medication. A reliable form of contraception is recommended while taking this medication and for 2 months after stopping it. Talk to your care team about reliable forms of contraception. Do not breast-feed while taking this medication and for 1 week after stopping therapy. Use a condom during sex and for 4 months after stopping therapy. Tell your care team right  away if you think your partner might be pregnant. This medication can cause serious birth defects. This medication may cause infertility. Talk to your care team if you are concerned about your fertility. What side effects may I notice from receiving this medication? Side effects that you should report to your care team as soon as possible: Allergic reactions--skin rash, itching, hives, swelling of the face, lips, tongue, or throat Change in vision such as blurry vision, seeing halos around lights, vision loss Infection--fever, chills, cough, or sore throat Infusion reactions--chest pain, shortness of breath or trouble breathing, feeling faint or lightheaded Low red blood cell level--unusual weakness or fatigue, dizziness, headache, trouble breathing Pain, tingling, or numbness in the hands or feet Painful swelling, warmth, or redness of the skin, blisters or sores at the infusion site Redness, blistering, peeling, or loosening of the skin, including inside the mouth Sudden or severe stomach pain, bloody diarrhea, fever, nausea, vomiting Swelling of the ankles, hands, or feet Tumor lysis syndrome (TLS)--nausea, vomiting, diarrhea, decrease in the amount of urine, dark urine, unusual weakness or fatigue, confusion, muscle pain or cramps, fast or irregular heartbeat, joint pain Unusual bruising or bleeding Side effects that usually do not require medical attention (report to your care team if they continue or are bothersome): Change in nail shape, thickness, or color Change in taste Hair loss Increased tears This list may not describe all possible side effects. Call your doctor for medical advice about side effects. You may report side effects to FDA at 1-800-FDA-1088. Where should I keep my medication? This medication is given in a hospital or clinic. It will not be stored at home. NOTE: This sheet is a summary. It may not cover all possible information. If you have questions about this  medicine, talk to your doctor, pharmacist, or health care provider.  2024 Elsevier/Gold Standard (2022-01-04 00:00:00)  Cyclophosphamide Injection What is this medication? CYCLOPHOSPHAMIDE (sye kloe FOSS fa mide) treats some types of cancer. It works by slowing down the growth of cancer cells. This medicine may be used for other purposes; ask your health care provider or pharmacist if you have questions. COMMON BRAND NAME(S): Cyclophosphamide, Cytoxan, Neosar What should I tell my care team before I take this medication? They need to know if you have any of these conditions: Heart disease Irregular heartbeat or rhythm Infection Kidney problems Liver disease Low blood cell levels (white cells, platelets, or red blood cells) Lung disease Previous radiation Trouble passing urine An unusual or allergic reaction to cyclophosphamide, other medications, foods, dyes, or preservatives Pregnant or trying to get pregnant Breast-feeding How should I use this medication? This medication is injected into a vein. It is given by your care team in a hospital or clinic setting. Talk to your care team about the use of this medication in children. Special care may be needed. Overdosage: If you think you have taken too much of this medicine contact a  poison control center or emergency room at once. NOTE: This medicine is only for you. Do not share this medicine with others. What if I miss a dose? Keep appointments for follow-up doses. It is important not to miss your dose. Call your care team if you are unable to keep an appointment. What may interact with this medication? Amphotericin B Amiodarone Azathioprine Certain antivirals for HIV or hepatitis Certain medications for blood pressure, such as enalapril, lisinopril, quinapril Cyclosporine Diuretics Etanercept Indomethacin Medications that relax muscles Metronidazole Natalizumab Tamoxifen Warfarin This list may not describe all possible  interactions. Give your health care provider a list of all the medicines, herbs, non-prescription drugs, or dietary supplements you use. Also tell them if you smoke, drink alcohol, or use illegal drugs. Some items may interact with your medicine. What should I watch for while using this medication? This medication may make you feel generally unwell. This is not uncommon as chemotherapy can affect healthy cells as well as cancer cells. Report any side effects. Continue your course of treatment even though you feel ill unless your care team tells you to stop. You may need blood work while you are taking this medication. This medication may increase your risk of getting an infection. Call your care team for advice if you get a fever, chills, sore throat, or other symptoms of a cold or flu. Do not treat yourself. Try to avoid being around people who are sick. Avoid taking medications that contain aspirin, acetaminophen, ibuprofen, naproxen, or ketoprofen unless instructed by your care team. These medications may hide a fever. Be careful brushing or flossing your teeth or using a toothpick because you may get an infection or bleed more easily. If you have any dental work done, tell your dentist you are receiving this medication. Drink water or other fluids as directed. Urinate often, even at night. Some products may contain alcohol. Ask your care team if this medication contains alcohol. Be sure to tell all care teams you are taking this medicine. Certain medicines, like metronidazole and disulfiram, can cause an unpleasant reaction when taken with alcohol. The reaction includes flushing, headache, nausea, vomiting, sweating, and increased thirst. The reaction can last from 30 minutes to several hours. Talk to your care team if you wish to become pregnant or think you might be pregnant. This medication can cause serious birth defects if taken during pregnancy and for 1 year after the last dose. A negative  pregnancy test is required before starting this medication. A reliable form of contraception is recommended while taking this medication and for 1 year after the last dose. Talk to your care team about reliable forms of contraception. Do not father a child while taking this medication and for 4 months after the last dose. Use a condom during this time period. Do not breast-feed while taking this medication or for 1 week after the last dose. This medication may cause infertility. Talk to your care team if you are concerned about your fertility. Talk to your care team about your risk of cancer. You may be more at risk for certain types of cancer if you take this medication. What side effects may I notice from receiving this medication? Side effects that you should report to your care team as soon as possible: Allergic reactions--skin rash, itching, hives, swelling of the face, lips, tongue, or throat Dry cough, shortness of breath or trouble breathing Heart failure--shortness of breath, swelling of the ankles, feet, or hands, sudden weight gain, unusual weakness  or fatigue Heart muscle inflammation--unusual weakness or fatigue, shortness of breath, chest pain, fast or irregular heartbeat, dizziness, swelling of the ankles, feet, or hands Heart rhythm changes--fast or irregular heartbeat, dizziness, feeling faint or lightheaded, chest pain, trouble breathing Infection--fever, chills, cough, sore throat, wounds that don't heal, pain or trouble when passing urine, general feeling of discomfort or being unwell Kidney injury--decrease in the amount of urine, swelling of the ankles, hands, or feet Liver injury--right upper belly pain, loss of appetite, nausea, light-colored stool, dark yellow or brown urine, yellowing skin or eyes, unusual weakness or fatigue Low red blood cell level--unusual weakness or fatigue, dizziness, headache, trouble breathing Low sodium level--muscle weakness, fatigue, dizziness,  headache, confusion Red or dark brown urine Unusual bruising or bleeding Side effects that usually do not require medical attention (report to your care team if they continue or are bothersome): Hair loss Irregular menstrual cycles or spotting Loss of appetite Nausea Pain, redness, or swelling with sores inside the mouth or throat Vomiting This list may not describe all possible side effects. Call your doctor for medical advice about side effects. You may report side effects to FDA at 1-800-FDA-1088. Where should I keep my medication? This medication is given in a hospital or clinic. It will not be stored at home. NOTE: This sheet is a summary. It may not cover all possible information. If you have questions about this medicine, talk to your doctor, pharmacist, or health care provider.  2024 Elsevier/Gold Standard (2022-03-16 00:00:00)

## 2023-12-12 ENCOUNTER — Telehealth: Payer: Self-pay

## 2023-12-12 ENCOUNTER — Other Ambulatory Visit: Payer: Self-pay | Admitting: Infectious Diseases

## 2023-12-12 DIAGNOSIS — B20 Human immunodeficiency virus [HIV] disease: Secondary | ICD-10-CM

## 2023-12-12 NOTE — Telephone Encounter (Signed)
Nicole [name] states that she is doing fine. She is eating, drinking, and urinating well. She knows to call the office at [phone] if  she has any questions or concerns.

## 2023-12-12 NOTE — Addendum Note (Signed)
Addended by: Bufford Spikes on: 12/12/2023 10:19 AM   Modules accepted: Orders

## 2023-12-12 NOTE — Telephone Encounter (Signed)
-----   Message from Nurse Lanora Manis A sent at 12/11/2023  4:23 PM EST ----- Regarding: First timer 12/11/23- Dr. Mosetta Putt. Breast cancer. Docetaxel, cytoxan Tolerated well.

## 2023-12-13 ENCOUNTER — Inpatient Hospital Stay: Payer: 59

## 2023-12-13 VITALS — BP 136/79 | HR 69 | Temp 98.1°F | Resp 18

## 2023-12-13 DIAGNOSIS — E1165 Type 2 diabetes mellitus with hyperglycemia: Secondary | ICD-10-CM | POA: Diagnosis not present

## 2023-12-13 DIAGNOSIS — I10 Essential (primary) hypertension: Secondary | ICD-10-CM | POA: Diagnosis not present

## 2023-12-13 DIAGNOSIS — Z5111 Encounter for antineoplastic chemotherapy: Secondary | ICD-10-CM | POA: Diagnosis not present

## 2023-12-13 DIAGNOSIS — E785 Hyperlipidemia, unspecified: Secondary | ICD-10-CM | POA: Diagnosis not present

## 2023-12-13 DIAGNOSIS — Z79630 Long term (current) use of alkylating agent: Secondary | ICD-10-CM | POA: Diagnosis not present

## 2023-12-13 DIAGNOSIS — Z5189 Encounter for other specified aftercare: Secondary | ICD-10-CM | POA: Diagnosis not present

## 2023-12-13 DIAGNOSIS — Z170 Estrogen receptor positive status [ER+]: Secondary | ICD-10-CM | POA: Diagnosis not present

## 2023-12-13 DIAGNOSIS — Z1722 Progesterone receptor negative status: Secondary | ICD-10-CM | POA: Diagnosis not present

## 2023-12-13 DIAGNOSIS — N6489 Other specified disorders of breast: Secondary | ICD-10-CM | POA: Diagnosis not present

## 2023-12-13 DIAGNOSIS — Z79633 Long term (current) use of mitotic inhibitor: Secondary | ICD-10-CM | POA: Diagnosis not present

## 2023-12-13 DIAGNOSIS — Z8673 Personal history of transient ischemic attack (TIA), and cerebral infarction without residual deficits: Secondary | ICD-10-CM | POA: Diagnosis not present

## 2023-12-13 DIAGNOSIS — Z7952 Long term (current) use of systemic steroids: Secondary | ICD-10-CM | POA: Diagnosis not present

## 2023-12-13 DIAGNOSIS — Z881 Allergy status to other antibiotic agents status: Secondary | ICD-10-CM | POA: Diagnosis not present

## 2023-12-13 DIAGNOSIS — K219 Gastro-esophageal reflux disease without esophagitis: Secondary | ICD-10-CM | POA: Diagnosis not present

## 2023-12-13 DIAGNOSIS — C50412 Malignant neoplasm of upper-outer quadrant of left female breast: Secondary | ICD-10-CM | POA: Diagnosis not present

## 2023-12-13 DIAGNOSIS — Z79899 Other long term (current) drug therapy: Secondary | ICD-10-CM | POA: Diagnosis not present

## 2023-12-13 DIAGNOSIS — M1611 Unilateral primary osteoarthritis, right hip: Secondary | ICD-10-CM | POA: Diagnosis not present

## 2023-12-13 MED ORDER — PEGFILGRASTIM-CBQV 6 MG/0.6ML ~~LOC~~ SOSY
6.0000 mg | PREFILLED_SYRINGE | Freq: Once | SUBCUTANEOUS | Status: AC
  Administered 2023-12-13: 6 mg via SUBCUTANEOUS
  Filled 2023-12-13: qty 0.6

## 2023-12-13 NOTE — Patient Instructions (Signed)

## 2023-12-17 ENCOUNTER — Telehealth: Payer: Self-pay | Admitting: Infectious Diseases

## 2023-12-17 NOTE — Telephone Encounter (Signed)
Called pt and discussed her labs.  Her Vl is up and she denies missed meds. Have sent genotype.  Asked her to come back in 2-3 months.

## 2023-12-21 LAB — GENOSURE PRIME (GSPRIL)

## 2023-12-23 LAB — SPECIMEN STATUS REPORT

## 2023-12-31 NOTE — Assessment & Plan Note (Signed)
-  [id number], stag I, G3, ER 20% WEAK, PR-, HER- (0), ki67 60%, with extensive DCIS -Diagnosed in October 2024 -I reviewed his surgical pathology findings, which showed DCIS, 8 mm invasive ductal carcinoma, repeated prognostic panel showed ER 20% weak positive, PR and HER2 negative with high Ki-67 60%, grade 3. -Due to the aggressive nature of her functional triple negative disease, I recommend her to consider adjuvant chemotherapy -Due to the positive margin, she proceeded with re-excision surgery on 11/13/2022 which showed additional 1cm invasive cancer, final margins were negative  -I recommend adjuvant chemo TC every 3 weeks for 4 cycles with G-CSF on day 3, she started on 12/11/2023.  Due to drug-drug interaction with her HIV medication, I reduced docetaxel dose by 50%.

## 2024-01-01 ENCOUNTER — Encounter: Payer: Self-pay | Admitting: *Deleted

## 2024-01-01 ENCOUNTER — Encounter: Payer: Self-pay | Admitting: Hematology

## 2024-01-01 ENCOUNTER — Other Ambulatory Visit: Payer: Self-pay

## 2024-01-01 ENCOUNTER — Inpatient Hospital Stay: Payer: 59

## 2024-01-01 ENCOUNTER — Inpatient Hospital Stay (HOSPITAL_BASED_OUTPATIENT_CLINIC_OR_DEPARTMENT_OTHER): Payer: 59 | Admitting: Hematology

## 2024-01-01 ENCOUNTER — Inpatient Hospital Stay: Payer: 59 | Attending: Hematology

## 2024-01-01 VITALS — BP 155/87 | HR 84 | Temp 98.3°F | Resp 17 | Wt 211.3 lb

## 2024-01-01 DIAGNOSIS — Z90710 Acquired absence of both cervix and uterus: Secondary | ICD-10-CM | POA: Diagnosis not present

## 2024-01-01 DIAGNOSIS — Z5111 Encounter for antineoplastic chemotherapy: Secondary | ICD-10-CM | POA: Diagnosis not present

## 2024-01-01 DIAGNOSIS — Z79630 Long term (current) use of alkylating agent: Secondary | ICD-10-CM | POA: Insufficient documentation

## 2024-01-01 DIAGNOSIS — Z79633 Long term (current) use of mitotic inhibitor: Secondary | ICD-10-CM | POA: Insufficient documentation

## 2024-01-01 DIAGNOSIS — Z95828 Presence of other vascular implants and grafts: Secondary | ICD-10-CM

## 2024-01-01 DIAGNOSIS — N6489 Other specified disorders of breast: Secondary | ICD-10-CM | POA: Diagnosis not present

## 2024-01-01 DIAGNOSIS — Z1732 Human epidermal growth factor receptor 2 negative status: Secondary | ICD-10-CM | POA: Insufficient documentation

## 2024-01-01 DIAGNOSIS — Z7952 Long term (current) use of systemic steroids: Secondary | ICD-10-CM | POA: Insufficient documentation

## 2024-01-01 DIAGNOSIS — Z881 Allergy status to other antibiotic agents status: Secondary | ICD-10-CM | POA: Diagnosis not present

## 2024-01-01 DIAGNOSIS — Z1722 Progesterone receptor negative status: Secondary | ICD-10-CM | POA: Insufficient documentation

## 2024-01-01 DIAGNOSIS — I10 Essential (primary) hypertension: Secondary | ICD-10-CM | POA: Insufficient documentation

## 2024-01-01 DIAGNOSIS — Z79899 Other long term (current) drug therapy: Secondary | ICD-10-CM | POA: Insufficient documentation

## 2024-01-01 DIAGNOSIS — Z170 Estrogen receptor positive status [ER+]: Secondary | ICD-10-CM | POA: Diagnosis not present

## 2024-01-01 DIAGNOSIS — Z8619 Personal history of other infectious and parasitic diseases: Secondary | ICD-10-CM | POA: Diagnosis not present

## 2024-01-01 DIAGNOSIS — Z8673 Personal history of transient ischemic attack (TIA), and cerebral infarction without residual deficits: Secondary | ICD-10-CM | POA: Diagnosis not present

## 2024-01-01 DIAGNOSIS — Z5189 Encounter for other specified aftercare: Secondary | ICD-10-CM | POA: Diagnosis not present

## 2024-01-01 DIAGNOSIS — Z79624 Long term (current) use of inhibitors of nucleotide synthesis: Secondary | ICD-10-CM | POA: Diagnosis not present

## 2024-01-01 DIAGNOSIS — C50412 Malignant neoplasm of upper-outer quadrant of left female breast: Secondary | ICD-10-CM | POA: Insufficient documentation

## 2024-01-01 DIAGNOSIS — E1165 Type 2 diabetes mellitus with hyperglycemia: Secondary | ICD-10-CM | POA: Insufficient documentation

## 2024-01-01 DIAGNOSIS — Z993 Dependence on wheelchair: Secondary | ICD-10-CM | POA: Insufficient documentation

## 2024-01-01 DIAGNOSIS — B182 Chronic viral hepatitis C: Secondary | ICD-10-CM | POA: Insufficient documentation

## 2024-01-01 DIAGNOSIS — Z21 Asymptomatic human immunodeficiency virus [HIV] infection status: Secondary | ICD-10-CM | POA: Diagnosis not present

## 2024-01-01 DIAGNOSIS — K219 Gastro-esophageal reflux disease without esophagitis: Secondary | ICD-10-CM | POA: Diagnosis not present

## 2024-01-01 LAB — CMP (CANCER CENTER ONLY)
ALT: 16 U/L (ref 0–44)
AST: 16 U/L (ref 15–41)
Albumin: 3.9 g/dL (ref 3.5–5.0)
Alkaline Phosphatase: 75 U/L (ref 38–126)
Anion gap: 8 (ref 5–15)
BUN: 13 mg/dL (ref 8–23)
CO2: 25 mmol/L (ref 22–32)
Calcium: 9.7 mg/dL (ref 8.9–10.3)
Chloride: 110 mmol/L (ref 98–111)
Creatinine: 0.62 mg/dL (ref 0.44–1.00)
GFR, Estimated: >60 mL/min (ref >60)
Glucose, Bld: 213 mg/dL — ABNORMAL HIGH (ref 70–99)
Potassium: 3.5 mmol/L (ref 3.5–5.1)
Sodium: 143 mmol/L (ref 135–145)
Total Bilirubin: 0.3 mg/dL (ref 0.0–1.2)
Total Protein: 7.7 g/dL (ref 6.5–8.1)

## 2024-01-01 LAB — CBC WITH DIFFERENTIAL (CANCER CENTER ONLY)
Abs Immature Granulocytes: 0.11 10*3/uL — ABNORMAL HIGH (ref 0.00–0.07)
Basophils Absolute: 0 10*3/uL (ref 0.0–0.1)
Basophils Relative: 0 %
Eosinophils Absolute: 0 10*3/uL (ref 0.0–0.5)
Eosinophils Relative: 0 %
HCT: 39.5 % (ref 36.0–46.0)
Hemoglobin: 13.6 g/dL (ref 12.0–15.0)
Immature Granulocytes: 1 %
Lymphocytes Relative: 11 %
Lymphs Abs: 0.9 10*3/uL (ref 0.7–4.0)
MCH: 29.2 pg (ref 26.0–34.0)
MCHC: 34.4 g/dL (ref 30.0–36.0)
MCV: 84.9 fL (ref 80.0–100.0)
Monocytes Absolute: 0.1 10*3/uL (ref 0.1–1.0)
Monocytes Relative: 1 %
Neutro Abs: 7.3 10*3/uL (ref 1.7–7.7)
Neutrophils Relative %: 87 %
Platelet Count: 270 10*3/uL (ref 150–400)
RBC: 4.65 MIL/uL (ref 3.87–5.11)
RDW: 13.9 % (ref 11.5–15.5)
WBC Count: 8.4 10*3/uL (ref 4.0–10.5)
nRBC: 0 % (ref 0.0–0.2)

## 2024-01-01 MED ORDER — PALONOSETRON HCL INJECTION 0.25 MG/5ML
0.2500 mg | Freq: Once | INTRAVENOUS | Status: AC
  Administered 2024-01-01: 0.25 mg via INTRAVENOUS
  Filled 2024-01-01: qty 5

## 2024-01-01 MED ORDER — HEPARIN SOD (PORK) LOCK FLUSH 100 UNIT/ML IV SOLN
500.0000 [IU] | Freq: Once | INTRAVENOUS | Status: AC | PRN
  Administered 2024-01-01: 500 [IU]

## 2024-01-01 MED ORDER — SODIUM CHLORIDE 0.9% FLUSH
10.0000 mL | INTRAVENOUS | Status: DC | PRN
  Administered 2024-01-01: 10 mL

## 2024-01-01 MED ORDER — SODIUM CHLORIDE 0.9 % IV SOLN: INTRAVENOUS | Status: DC

## 2024-01-01 MED ORDER — DEXAMETHASONE SODIUM PHOSPHATE 10 MG/ML IJ SOLN
10.0000 mg | Freq: Once | INTRAMUSCULAR | Status: AC
  Administered 2024-01-01: 10 mg via INTRAVENOUS
  Filled 2024-01-01: qty 1

## 2024-01-01 MED ORDER — SODIUM CHLORIDE 0.9% FLUSH
10.0000 mL | Freq: Once | INTRAVENOUS | Status: AC
Start: 2024-01-01 — End: 2024-01-01
  Administered 2024-01-01: 10 mL

## 2024-01-01 MED ORDER — SODIUM CHLORIDE 0.9 % IV SOLN
37.5000 mg/m2 | Freq: Once | INTRAVENOUS | Status: AC
  Administered 2024-01-01: 80 mg via INTRAVENOUS
  Filled 2024-01-01: qty 8

## 2024-01-01 MED ORDER — CYCLOPHOSPHAMIDE CHEMO INJECTION 1 GM
600.0000 mg/m2 | Freq: Once | INTRAMUSCULAR | Status: AC
  Administered 2024-01-01: 1300 mg via INTRAVENOUS
  Filled 2024-01-01: qty 65
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
## 2024-01-01 NOTE — Patient Instructions (Signed)
 [facility] - A DEPT OF [facility]  Discharge Instructions: Thank you for choosing [facility] to provide your oncology and hematology care.   If you have a lab appointment with the Cancer Center, please go directly to the Cancer Center and check in at the registration area.   Wear comfortable clothing and clothing appropriate for easy access to any Portacath or PICC line.   We strive to give you quality time with your provider. You may need to reschedule your appointment if you arrive late (15 or more minutes).  Arriving late affects you and other patients whose appointments are after yours.  Also, if you miss three or more appointments without notifying the office, you may be dismissed from the clinic at the provider's discretion.      For prescription refill requests, have your pharmacy contact our office and allow 72 hours for refills to be completed.    Today you received the following chemotherapy and/or immunotherapy agents: Docetaxel & Cytoxan      To help prevent nausea and vomiting after your treatment, we encourage you to take your nausea medication as directed.  BELOW ARE SYMPTOMS THAT SHOULD BE REPORTED IMMEDIATELY: *FEVER GREATER THAN 100.4 F (38 C) OR HIGHER *CHILLS OR SWEATING *NAUSEA AND VOMITING THAT IS NOT CONTROLLED WITH YOUR NAUSEA MEDICATION *UNUSUAL SHORTNESS OF BREATH *UNUSUAL BRUISING OR BLEEDING *URINARY PROBLEMS (pain or burning when urinating, or frequent urination) *BOWEL PROBLEMS (unusual diarrhea, constipation, pain near the anus) TENDERNESS IN MOUTH AND THROAT WITH OR WITHOUT PRESENCE OF ULCERS (sore throat, sores in mouth, or a toothache) UNUSUAL RASH, SWELLING OR PAIN  UNUSUAL VAGINAL DISCHARGE OR ITCHING   Items with * indicate a potential emergency and should be followed up as soon as possible or go to the Emergency Department if any problems should occur.  Please show the CHEMOTHERAPY ALERT CARD or  IMMUNOTHERAPY ALERT CARD at check-in to the Emergency Department and triage nurse.  Should you have questions after your visit or need to cancel or reschedule your appointment, please contact [facility] - A DEPT OF [facility]  Dept: [phone]  and follow the prompts.  Office hours are 8:00 a.m. to 4:30 p.m. Monday - Friday. Please note that voicemails left after 4:00 p.m. may not be returned until the following business day.  We are closed weekends and major holidays. You have access to a nurse at all times for urgent questions. Please call the main number to the clinic Dept: [phone] and follow the prompts.   For any non-urgent questions, you may also contact your provider using MyChart. We now offer e-Visits for anyone 45 and older to request care online for non-urgent symptoms. For details visit [email or web address].   Also download the MyChart app! Go to the app store, search "MyChart", open the app, select [place], and log in with your MyChart username and password.

## 2024-01-03 ENCOUNTER — Inpatient Hospital Stay: Payer: 59

## 2024-01-03 VITALS — BP 139/76 | HR 88 | Temp 98.0°F | Resp 20

## 2024-01-03 DIAGNOSIS — Z993 Dependence on wheelchair: Secondary | ICD-10-CM | POA: Diagnosis not present

## 2024-01-03 DIAGNOSIS — Z170 Estrogen receptor positive status [ER+]: Secondary | ICD-10-CM

## 2024-01-03 DIAGNOSIS — Z1722 Progesterone receptor negative status: Secondary | ICD-10-CM | POA: Diagnosis not present

## 2024-01-03 DIAGNOSIS — Z5189 Encounter for other specified aftercare: Secondary | ICD-10-CM | POA: Diagnosis not present

## 2024-01-03 DIAGNOSIS — K219 Gastro-esophageal reflux disease without esophagitis: Secondary | ICD-10-CM | POA: Diagnosis not present

## 2024-01-03 DIAGNOSIS — Z5111 Encounter for antineoplastic chemotherapy: Secondary | ICD-10-CM | POA: Diagnosis not present

## 2024-01-03 DIAGNOSIS — Z79624 Long term (current) use of inhibitors of nucleotide synthesis: Secondary | ICD-10-CM | POA: Diagnosis not present

## 2024-01-03 DIAGNOSIS — Z8619 Personal history of other infectious and parasitic diseases: Secondary | ICD-10-CM | POA: Diagnosis not present

## 2024-01-03 DIAGNOSIS — Z79899 Other long term (current) drug therapy: Secondary | ICD-10-CM | POA: Diagnosis not present

## 2024-01-03 DIAGNOSIS — C50412 Malignant neoplasm of upper-outer quadrant of left female breast: Secondary | ICD-10-CM | POA: Diagnosis not present

## 2024-01-03 DIAGNOSIS — N6489 Other specified disorders of breast: Secondary | ICD-10-CM | POA: Diagnosis not present

## 2024-01-03 DIAGNOSIS — Z79633 Long term (current) use of mitotic inhibitor: Secondary | ICD-10-CM | POA: Diagnosis not present

## 2024-01-03 DIAGNOSIS — I10 Essential (primary) hypertension: Secondary | ICD-10-CM | POA: Diagnosis not present

## 2024-01-03 DIAGNOSIS — Z881 Allergy status to other antibiotic agents status: Secondary | ICD-10-CM | POA: Diagnosis not present

## 2024-01-03 DIAGNOSIS — Z7952 Long term (current) use of systemic steroids: Secondary | ICD-10-CM | POA: Diagnosis not present

## 2024-01-03 DIAGNOSIS — Z8673 Personal history of transient ischemic attack (TIA), and cerebral infarction without residual deficits: Secondary | ICD-10-CM | POA: Diagnosis not present

## 2024-01-03 DIAGNOSIS — Z1732 Human epidermal growth factor receptor 2 negative status: Secondary | ICD-10-CM | POA: Diagnosis not present

## 2024-01-03 DIAGNOSIS — Z79630 Long term (current) use of alkylating agent: Secondary | ICD-10-CM | POA: Diagnosis not present

## 2024-01-03 DIAGNOSIS — E1165 Type 2 diabetes mellitus with hyperglycemia: Secondary | ICD-10-CM | POA: Diagnosis not present

## 2024-01-03 MED ORDER — PEGFILGRASTIM-CBQV 6 MG/0.6ML ~~LOC~~ SOSY
6.0000 mg | PREFILLED_SYRINGE | Freq: Once | SUBCUTANEOUS | Status: AC
Start: 2024-01-03 — End: 2024-01-03
  Administered 2024-01-03: 6 mg via SUBCUTANEOUS
  Filled 2024-01-03: qty 0.6

## 2024-01-07 ENCOUNTER — Ambulatory Visit: Payer: Self-pay

## 2024-01-07 NOTE — Patient Outreach (Signed)
 Care Coordination   Follow Up Visit Note   01/07/2024 Name: Nicole [name] MRN: [id number] DOB: Nov 16, 1960  Nicole [name] is a 63 y.o. year old female who sees [name], MD for primary care. I spoke with  [name] by phone today.  What matters to the patients health and wellness today?  I engaged in a discussion with [name] today, during which she reported that her overall condition remains stable. Although her blood pressure has fluctuated since the commencement of chemotherapy, it has recently stabilized within an acceptable range of 139/79. [name] is approaching the conclusion of her treatment, with only two sessions remaining. She indicated that she has not experienced any nausea, vomiting, or other significant side effects, apart from mild fatigue. Furthermore, she has been able to maintain a balanced diet and sleep regimen while consistently adhering to her medication schedule.    Goals Addressed             This Visit's Progress    I want to gain knowledge to manage and maintain my health condition HTN and Type II DM       Patient Goals/Self Care Activities: -Patient/Caregiver will self-administer medications as prescribed as evidenced by self-report/primary caregiver report  -Patient/Caregiver will attend all scheduled provider appointments as evidenced by clinician review of documented attendance to scheduled appointments and patient/caregiver report -Patient/Caregiver will call provider office for new concerns or questions as evidenced by review of documented incoming telephone call notes and patient report -Calls provider office for new concerns, questions, or BP outside discussed parameters -Checks BP and records as discussed -continue to cut down on smoking -drink more water -Continue to walk more with walker to build strength -Monitor the elevations in blood pressure and call physician if they continue BP Readings from Last 3 Encounters:  01/03/24 139/76   01/01/24 (!) 155/87  12/13/23 136/79   Reviewed medications with patient and discussed importance of medication adherence Counseled on importance of regular laboratory monitoring as prescribed Provided patient with written educational materials related to hypo and hyperglycemia and importance of correct treatment Review of patient status, including review of consultants reports, relevant laboratory and other test results, and medications completed Lab Results  Component Value Date   HGBA1C 6.1 (H) 12/10/2023   Last practice recorded BP readings:  BP Readings from Last 3 Encounters:  01/03/24 139/76  01/01/24 (!) 155/87  12/13/23 136/79   Most recent eGFR/CrCl:  Lab Results  Component Value Date   EGFR 100 05/14/2023    No components found for: "CRCL"  Provided education to patient re: stroke prevention, s/s of heart attack and stroke Discussed complications of poorly controlled blood pressure such as heart disease, stroke, circulatory complications, vision complications, kidney impairment, sexual dysfunction            SDOH assessments and interventions completed:  No     Care Coordination Interventions:  Yes, provided   Interventions Today    Flowsheet Row Most Recent Value  Chronic Disease   Chronic disease during today's visit Diabetes, Hypertension (HTN)  General Interventions   General Interventions Discussed/Reviewed General Interventions Discussed, General Interventions Reviewed  Pharmacy Interventions   Pharmacy Dicussed/Reviewed Pharmacy Topics Discussed  Safety Interventions   Safety Discussed/Reviewed Safety Discussed        Follow up plan: Follow up call scheduled for 02/05/24  10 am    Encounter Outcome:  Patient Visit Completed   [name] RN, BSN, [facility] [place]  [facility],  Population Health  Care Coordinator Phone: [phone]

## 2024-01-07 NOTE — Patient Instructions (Signed)
 Visit Information  Thank you for taking time to visit with me today. Please don't hesitate to contact me if I can be of assistance to you.   Following are the goals we discussed today:   Goals Addressed             This Visit's Progress    I want to gain knowledge to manage and maintain my health condition HTN and Type II DM       Patient Goals/Self Care Activities: -Patient/Caregiver will self-administer medications as prescribed as evidenced by self-report/primary caregiver report  -Patient/Caregiver will attend all scheduled provider appointments as evidenced by clinician review of documented attendance to scheduled appointments and patient/caregiver report -Patient/Caregiver will call provider office for new concerns or questions as evidenced by review of documented incoming telephone call notes and patient report -Calls provider office for new concerns, questions, or BP outside discussed parameters -Checks BP and records as discussed -continue to cut down on smoking -drink more water -Continue to walk more with walker to build strength -Monitor the elevations in blood pressure and call physician if they continue BP Readings from Last 3 Encounters:  01/03/24 139/76  01/01/24 (!) 155/87  12/13/23 136/79   Reviewed medications with patient and discussed importance of medication adherence Counseled on importance of regular laboratory monitoring as prescribed Provided patient with written educational materials related to hypo and hyperglycemia and importance of correct treatment Review of patient status, including review of consultants reports, relevant laboratory and other test results, and medications completed Lab Results  Component Value Date   HGBA1C 6.1 (H) 12/10/2023   Last practice recorded BP readings:  BP Readings from Last 3 Encounters:  01/03/24 139/76  01/01/24 (!) 155/87  12/13/23 136/79   Most recent eGFR/CrCl:  Lab Results  Component Value Date   EGFR 100  05/14/2023    No components found for: "CRCL"  Provided education to patient re: stroke prevention, s/s of heart attack and stroke Discussed complications of poorly controlled blood pressure such as heart disease, stroke, circulatory complications, vision complications, kidney impairment, sexual dysfunction            Our next appointment is by telephone on 02/05/24 at 10 am  Please call the care guide team at [phone] if you need to cancel or reschedule your appointment.   If you are experiencing a Mental Health or Behavioral Health Crisis or need someone to talk to, please call 1-800-273-TALK (toll free, 24 hour hotline)  The patient verbalized understanding of instructions.   [name] RN, BSN, [facility] [place]  [facility], [facility]  Care Coordinator Phone: [phone]

## 2024-01-21 NOTE — Assessment & Plan Note (Signed)
-  [id number], stag I, G3, ER 20% WEAK, PR-, HER- (0), ki67 60%, with extensive DCIS -Diagnosed in October 2024 -I reviewed his surgical pathology findings, which showed DCIS, 8 mm invasive ductal carcinoma, repeated prognostic panel showed ER 20% weak positive, PR and HER2 negative with high Ki-67 60%, grade 3. -Due to the aggressive nature of her functional triple negative disease, I recommend her to consider adjuvant chemotherapy -Due to the positive margin, she proceeded with re-excision surgery on 11/13/2022 which showed additional 1cm invasive cancer, final margins were negative  -I recommend adjuvant chemo TC every 3 weeks for 4 cycles with G-CSF on day 3, she started on 12/11/2023.  Due to drug-drug interaction with her HIV medication, I reduced docetaxel dose by 50%.

## 2024-01-22 ENCOUNTER — Inpatient Hospital Stay (HOSPITAL_BASED_OUTPATIENT_CLINIC_OR_DEPARTMENT_OTHER): Payer: 59 | Admitting: Hematology

## 2024-01-22 ENCOUNTER — Encounter: Payer: Self-pay | Admitting: Hematology

## 2024-01-22 ENCOUNTER — Inpatient Hospital Stay: Payer: 59 | Attending: Hematology

## 2024-01-22 ENCOUNTER — Inpatient Hospital Stay: Payer: 59

## 2024-01-22 VITALS — BP 151/83 | HR 75 | Resp 18

## 2024-01-22 VITALS — BP 156/92 | HR 62 | Temp 97.6°F | Resp 20 | Ht 67.0 in | Wt 221.2 lb

## 2024-01-22 DIAGNOSIS — M7989 Other specified soft tissue disorders: Secondary | ICD-10-CM | POA: Diagnosis not present

## 2024-01-22 DIAGNOSIS — Z7952 Long term (current) use of systemic steroids: Secondary | ICD-10-CM | POA: Diagnosis not present

## 2024-01-22 DIAGNOSIS — E119 Type 2 diabetes mellitus without complications: Secondary | ICD-10-CM | POA: Diagnosis not present

## 2024-01-22 DIAGNOSIS — Z8619 Personal history of other infectious and parasitic diseases: Secondary | ICD-10-CM | POA: Diagnosis not present

## 2024-01-22 DIAGNOSIS — F419 Anxiety disorder, unspecified: Secondary | ICD-10-CM | POA: Insufficient documentation

## 2024-01-22 DIAGNOSIS — Z5111 Encounter for antineoplastic chemotherapy: Secondary | ICD-10-CM | POA: Insufficient documentation

## 2024-01-22 DIAGNOSIS — Z1722 Progesterone receptor negative status: Secondary | ICD-10-CM | POA: Insufficient documentation

## 2024-01-22 DIAGNOSIS — Z170 Estrogen receptor positive status [ER+]: Secondary | ICD-10-CM | POA: Insufficient documentation

## 2024-01-22 DIAGNOSIS — R5383 Other fatigue: Secondary | ICD-10-CM | POA: Diagnosis not present

## 2024-01-22 DIAGNOSIS — Z90710 Acquired absence of both cervix and uterus: Secondary | ICD-10-CM | POA: Diagnosis not present

## 2024-01-22 DIAGNOSIS — Z5189 Encounter for other specified aftercare: Secondary | ICD-10-CM | POA: Insufficient documentation

## 2024-01-22 DIAGNOSIS — Z79624 Long term (current) use of inhibitors of nucleotide synthesis: Secondary | ICD-10-CM | POA: Insufficient documentation

## 2024-01-22 DIAGNOSIS — Z95828 Presence of other vascular implants and grafts: Secondary | ICD-10-CM

## 2024-01-22 DIAGNOSIS — Z881 Allergy status to other antibiotic agents status: Secondary | ICD-10-CM | POA: Insufficient documentation

## 2024-01-22 DIAGNOSIS — I10 Essential (primary) hypertension: Secondary | ICD-10-CM | POA: Insufficient documentation

## 2024-01-22 DIAGNOSIS — N6489 Other specified disorders of breast: Secondary | ICD-10-CM | POA: Diagnosis not present

## 2024-01-22 DIAGNOSIS — Z79630 Long term (current) use of alkylating agent: Secondary | ICD-10-CM | POA: Diagnosis not present

## 2024-01-22 DIAGNOSIS — Z1732 Human epidermal growth factor receptor 2 negative status: Secondary | ICD-10-CM | POA: Insufficient documentation

## 2024-01-22 DIAGNOSIS — C50412 Malignant neoplasm of upper-outer quadrant of left female breast: Secondary | ICD-10-CM | POA: Insufficient documentation

## 2024-01-22 DIAGNOSIS — Z21 Asymptomatic human immunodeficiency virus [HIV] infection status: Secondary | ICD-10-CM | POA: Diagnosis not present

## 2024-01-22 DIAGNOSIS — Z7982 Long term (current) use of aspirin: Secondary | ICD-10-CM | POA: Diagnosis not present

## 2024-01-22 DIAGNOSIS — Z79633 Long term (current) use of mitotic inhibitor: Secondary | ICD-10-CM | POA: Insufficient documentation

## 2024-01-22 DIAGNOSIS — Z8673 Personal history of transient ischemic attack (TIA), and cerebral infarction without residual deficits: Secondary | ICD-10-CM | POA: Insufficient documentation

## 2024-01-22 DIAGNOSIS — Z79899 Other long term (current) drug therapy: Secondary | ICD-10-CM | POA: Insufficient documentation

## 2024-01-22 LAB — CBC WITH DIFFERENTIAL (CANCER CENTER ONLY)
Abs Immature Granulocytes: 0.38 10*3/uL — ABNORMAL HIGH (ref 0.00–0.07)
Basophils Absolute: 0.1 10*3/uL (ref 0.0–0.1)
Basophils Relative: 1 %
Eosinophils Absolute: 0 10*3/uL (ref 0.0–0.5)
Eosinophils Relative: 0 %
HCT: 37 % (ref 36.0–46.0)
Hemoglobin: 12.4 g/dL (ref 12.0–15.0)
Immature Granulocytes: 4 %
Lymphocytes Relative: 12 %
Lymphs Abs: 1.3 10*3/uL (ref 0.7–4.0)
MCH: 29 pg (ref 26.0–34.0)
MCHC: 33.5 g/dL (ref 30.0–36.0)
MCV: 86.4 fL (ref 80.0–100.0)
Monocytes Absolute: 0.1 10*3/uL (ref 0.1–1.0)
Monocytes Relative: 1 %
Neutro Abs: 9 10*3/uL — ABNORMAL HIGH (ref 1.7–7.7)
Neutrophils Relative %: 82 %
Platelet Count: 255 10*3/uL (ref 150–400)
RBC: 4.28 MIL/uL (ref 3.87–5.11)
RDW: 15.7 % — ABNORMAL HIGH (ref 11.5–15.5)
WBC Count: 10.8 10*3/uL — ABNORMAL HIGH (ref 4.0–10.5)
nRBC: 0 % (ref 0.0–0.2)

## 2024-01-22 LAB — CMP (CANCER CENTER ONLY)
ALT: 33 U/L (ref 0–44)
AST: 24 U/L (ref 15–41)
Albumin: 3.8 g/dL (ref 3.5–5.0)
Alkaline Phosphatase: 85 U/L (ref 38–126)
Anion gap: 6 (ref 5–15)
BUN: 9 mg/dL (ref 8–23)
CO2: 26 mmol/L (ref 22–32)
Calcium: 9.3 mg/dL (ref 8.9–10.3)
Chloride: 109 mmol/L (ref 98–111)
Creatinine: 0.47 mg/dL (ref 0.44–1.00)
GFR, Estimated: >60 mL/min (ref >60)
Glucose, Bld: 216 mg/dL — ABNORMAL HIGH (ref 70–99)
Potassium: 4.1 mmol/L (ref 3.5–5.1)
Sodium: 141 mmol/L (ref 135–145)
Total Bilirubin: 0.4 mg/dL (ref 0.0–1.2)
Total Protein: 7.4 g/dL (ref 6.5–8.1)

## 2024-01-22 MED ORDER — PALONOSETRON HCL INJECTION 0.25 MG/5ML
0.2500 mg | Freq: Once | INTRAVENOUS | Status: AC
  Administered 2024-01-22: 0.25 mg via INTRAVENOUS
  Filled 2024-01-22: qty 5

## 2024-01-22 MED ORDER — HEPARIN SOD (PORK) LOCK FLUSH 100 UNIT/ML IV SOLN
500.0000 [IU] | Freq: Once | INTRAVENOUS | Status: AC | PRN
  Administered 2024-01-22: 500 [IU]

## 2024-01-22 MED ORDER — SODIUM CHLORIDE 0.9% FLUSH
10.0000 mL | INTRAVENOUS | Status: DC | PRN
  Administered 2024-01-22: 10 mL

## 2024-01-22 MED ORDER — DEXAMETHASONE SODIUM PHOSPHATE 10 MG/ML IJ SOLN
10.0000 mg | Freq: Once | INTRAMUSCULAR | Status: AC
  Administered 2024-01-22: 10 mg via INTRAVENOUS
  Filled 2024-01-22: qty 1

## 2024-01-22 MED ORDER — SODIUM CHLORIDE 0.9 % IV SOLN
600.0000 mg/m2 | Freq: Once | INTRAVENOUS | Status: AC
  Administered 2024-01-22: 1300 mg via INTRAVENOUS
  Filled 2024-01-22: qty 65

## 2024-01-22 MED ORDER — SODIUM CHLORIDE 0.9% FLUSH
10.0000 mL | Freq: Once | INTRAVENOUS | Status: AC
  Administered 2024-01-22: 10 mL

## 2024-01-22 MED ORDER — SODIUM CHLORIDE 0.9 % IV SOLN
37.5000 mg/m2 | Freq: Once | INTRAVENOUS | Status: AC
  Administered 2024-01-22: 80 mg via INTRAVENOUS
  Filled 2024-01-22: qty 8

## 2024-01-22 MED ORDER — SODIUM CHLORIDE 0.9 % IV SOLN: INTRAVENOUS | Status: DC

## 2024-01-22 NOTE — Patient Instructions (Signed)
 [facility] - A DEPT OF [facility]  Discharge Instructions: Thank you for choosing [facility] to provide your oncology and hematology care.   If you have a lab appointment with the Cancer Center, please go directly to the Cancer Center and check in at the registration area.   Wear comfortable clothing and clothing appropriate for easy access to any Portacath or PICC line.   We strive to give you quality time with your provider. You may need to reschedule your appointment if you arrive late (15 or more minutes).  Arriving late affects you and other patients whose appointments are after yours.  Also, if you miss three or more appointments without notifying the office, you may be dismissed from the clinic at the provider's discretion.      For prescription refill requests, have your pharmacy contact our office and allow 72 hours for refills to be completed.    Today you received the following chemotherapy and/or immunotherapy agents: Docetaxel (Taxotere) and Cyclophosphamide (Cytoxan)       To help prevent nausea and vomiting after your treatment, we encourage you to take your nausea medication as directed.  BELOW ARE SYMPTOMS THAT SHOULD BE REPORTED IMMEDIATELY: *FEVER GREATER THAN 100.4 F (38 C) OR HIGHER *CHILLS OR SWEATING *NAUSEA AND VOMITING THAT IS NOT CONTROLLED WITH YOUR NAUSEA MEDICATION *UNUSUAL SHORTNESS OF BREATH *UNUSUAL BRUISING OR BLEEDING *URINARY PROBLEMS (pain or burning when urinating, or frequent urination) *BOWEL PROBLEMS (unusual diarrhea, constipation, pain near the anus) TENDERNESS IN MOUTH AND THROAT WITH OR WITHOUT PRESENCE OF ULCERS (sore throat, sores in mouth, or a toothache) UNUSUAL RASH, SWELLING OR PAIN  UNUSUAL VAGINAL DISCHARGE OR ITCHING   Items with * indicate a potential emergency and should be followed up as soon as possible or go to the Emergency Department if any problems should occur.  Please show  the CHEMOTHERAPY ALERT CARD or IMMUNOTHERAPY ALERT CARD at check-in to the Emergency Department and triage nurse.  Should you have questions after your visit or need to cancel or reschedule your appointment, please contact [facility] - A DEPT OF [facility]  Dept: [phone]  and follow the prompts.  Office hours are 8:00 a.m. to 4:30 p.m. Monday - Friday. Please note that voicemails left after 4:00 p.m. may not be returned until the following business day.  We are closed weekends and major holidays. You have access to a nurse at all times for urgent questions. Please call the main number to the clinic Dept: [phone] and follow the prompts.   For any non-urgent questions, you may also contact your provider using MyChart. We now offer e-Visits for anyone 48 and older to request care online for non-urgent symptoms. For details visit [email or web address].   Also download the MyChart app! Go to the app store, search "MyChart", open the app, select [place], and log in with your MyChart username and password.
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
## 2024-01-24 ENCOUNTER — Inpatient Hospital Stay: Payer: 59

## 2024-01-24 VITALS — BP 149/68 | HR 74 | Temp 98.3°F | Resp 18

## 2024-01-24 DIAGNOSIS — Z170 Estrogen receptor positive status [ER+]: Secondary | ICD-10-CM | POA: Diagnosis not present

## 2024-01-24 DIAGNOSIS — N6489 Other specified disorders of breast: Secondary | ICD-10-CM | POA: Diagnosis not present

## 2024-01-24 DIAGNOSIS — Z79899 Other long term (current) drug therapy: Secondary | ICD-10-CM | POA: Diagnosis not present

## 2024-01-24 DIAGNOSIS — Z7982 Long term (current) use of aspirin: Secondary | ICD-10-CM | POA: Diagnosis not present

## 2024-01-24 DIAGNOSIS — Z8673 Personal history of transient ischemic attack (TIA), and cerebral infarction without residual deficits: Secondary | ICD-10-CM | POA: Diagnosis not present

## 2024-01-24 DIAGNOSIS — Z1732 Human epidermal growth factor receptor 2 negative status: Secondary | ICD-10-CM | POA: Diagnosis not present

## 2024-01-24 DIAGNOSIS — Z881 Allergy status to other antibiotic agents status: Secondary | ICD-10-CM | POA: Diagnosis not present

## 2024-01-24 DIAGNOSIS — C50412 Malignant neoplasm of upper-outer quadrant of left female breast: Secondary | ICD-10-CM

## 2024-01-24 DIAGNOSIS — Z79633 Long term (current) use of mitotic inhibitor: Secondary | ICD-10-CM | POA: Diagnosis not present

## 2024-01-24 DIAGNOSIS — Z5189 Encounter for other specified aftercare: Secondary | ICD-10-CM | POA: Diagnosis not present

## 2024-01-24 DIAGNOSIS — Z5111 Encounter for antineoplastic chemotherapy: Secondary | ICD-10-CM | POA: Diagnosis not present

## 2024-01-24 DIAGNOSIS — Z1722 Progesterone receptor negative status: Secondary | ICD-10-CM | POA: Diagnosis not present

## 2024-01-24 DIAGNOSIS — M7989 Other specified soft tissue disorders: Secondary | ICD-10-CM | POA: Diagnosis not present

## 2024-01-24 DIAGNOSIS — Z79624 Long term (current) use of inhibitors of nucleotide synthesis: Secondary | ICD-10-CM | POA: Diagnosis not present

## 2024-01-24 DIAGNOSIS — R5383 Other fatigue: Secondary | ICD-10-CM | POA: Diagnosis not present

## 2024-01-24 DIAGNOSIS — Z8619 Personal history of other infectious and parasitic diseases: Secondary | ICD-10-CM | POA: Diagnosis not present

## 2024-01-24 DIAGNOSIS — Z79630 Long term (current) use of alkylating agent: Secondary | ICD-10-CM | POA: Diagnosis not present

## 2024-01-24 DIAGNOSIS — I10 Essential (primary) hypertension: Secondary | ICD-10-CM | POA: Diagnosis not present

## 2024-01-24 DIAGNOSIS — E119 Type 2 diabetes mellitus without complications: Secondary | ICD-10-CM | POA: Diagnosis not present

## 2024-01-24 DIAGNOSIS — Z7952 Long term (current) use of systemic steroids: Secondary | ICD-10-CM | POA: Diagnosis not present

## 2024-01-24 MED ORDER — PEGFILGRASTIM-CBQV 6 MG/0.6ML ~~LOC~~ SOSY
6.0000 mg | PREFILLED_SYRINGE | Freq: Once | SUBCUTANEOUS | Status: AC
  Administered 2024-01-24: 6 mg via SUBCUTANEOUS
  Filled 2024-01-24: qty 0.6

## 2024-01-27 ENCOUNTER — Ambulatory Visit: Admitting: Student

## 2024-01-27 VITALS — BP 125/68 | HR 102 | Temp 98.2°F | Ht 67.0 in | Wt 220.0 lb

## 2024-01-27 DIAGNOSIS — Z7984 Long term (current) use of oral hypoglycemic drugs: Secondary | ICD-10-CM | POA: Diagnosis not present

## 2024-01-27 DIAGNOSIS — B20 Human immunodeficiency virus [HIV] disease: Secondary | ICD-10-CM

## 2024-01-27 DIAGNOSIS — E785 Hyperlipidemia, unspecified: Secondary | ICD-10-CM | POA: Diagnosis not present

## 2024-01-27 DIAGNOSIS — Z7985 Long-term (current) use of injectable non-insulin antidiabetic drugs: Secondary | ICD-10-CM

## 2024-01-27 DIAGNOSIS — F17200 Nicotine dependence, unspecified, uncomplicated: Secondary | ICD-10-CM

## 2024-01-27 DIAGNOSIS — K219 Gastro-esophageal reflux disease without esophagitis: Secondary | ICD-10-CM

## 2024-01-27 DIAGNOSIS — E119 Type 2 diabetes mellitus without complications: Secondary | ICD-10-CM | POA: Diagnosis not present

## 2024-01-27 DIAGNOSIS — F17210 Nicotine dependence, cigarettes, uncomplicated: Secondary | ICD-10-CM

## 2024-01-27 DIAGNOSIS — I10 Essential (primary) hypertension: Secondary | ICD-10-CM

## 2024-01-27 DIAGNOSIS — F419 Anxiety disorder, unspecified: Secondary | ICD-10-CM

## 2024-01-27 MED ORDER — BUPROPION HCL ER (SR) 150 MG PO TB12: 150.0000 mg | ORAL_TABLET | Freq: Every day | ORAL | 1 refills | Status: DC

## 2024-01-27 MED ORDER — ASPIRIN 81 MG PO TBEC: 81.0000 mg | DELAYED_RELEASE_TABLET | Freq: Every day | ORAL | 3 refills | Status: DC

## 2024-01-27 MED ORDER — GENVOYA 150-150-200-10 MG PO TABS: 1.0000 | ORAL_TABLET | Freq: Every day | ORAL | 11 refills | Status: AC

## 2024-01-27 MED ORDER — ESCITALOPRAM OXALATE 20 MG PO TABS: 20.0000 mg | ORAL_TABLET | Freq: Every day | ORAL | 0 refills | Status: DC

## 2024-01-27 MED ORDER — SEMAGLUTIDE(0.25 OR 0.5MG/DOS) 2 MG/3ML ~~LOC~~ SOPN
0.5000 mg | PEN_INJECTOR | SUBCUTANEOUS | 2 refills | Status: DC
Start: 2024-01-27 — End: 2024-04-01

## 2024-01-27 MED ORDER — ROSUVASTATIN CALCIUM 20 MG PO TABS: 20.0000 mg | ORAL_TABLET | Freq: Every day | ORAL | 1 refills | Status: DC

## 2024-01-27 MED ORDER — OMEPRAZOLE 20 MG PO CPDR: 20.0000 mg | DELAYED_RELEASE_CAPSULE | Freq: Every day | ORAL | 1 refills | Status: DC

## 2024-01-27 MED ORDER — DAPAGLIFLOZIN PROPANEDIOL 10 MG PO TABS: 10.0000 mg | ORAL_TABLET | Freq: Every day | ORAL | 3 refills | Status: DC

## 2024-01-27 MED ORDER — OLMESARTAN-AMLODIPINE-HCTZ 40-5-12.5 MG PO TABS: 1.0000 | ORAL_TABLET | Freq: Every day | ORAL | 2 refills | Status: DC

## 2024-01-27 NOTE — Assessment & Plan Note (Signed)
 Current medications include olmesartan-amlodipine-hydrochlorothiazide 40-5-12.5 mg daily. Blood pressure today is 125/68. Blood pressure at home is 120s-130s/80s. CMP on 01/22/24 unremarkable. Denies any associated signs or symptoms.  - Continue olmesartan-amlodipine-hydrochlorothiazide 40-5-12.5 mg daily
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
## 2024-01-27 NOTE — Assessment & Plan Note (Signed)
 Current medication include rosuvastatin 20 mg daily. Denies any signs or symptoms. Last lipid panel on 12/10/23 normal with LDL of 59.  - Continue rosuvastatin 20 mg daily

## 2024-01-27 NOTE — Patient Instructions (Signed)
 Thank you so much for coming to the clinic today!   Please continue to take your medications as prescribed.  We will see you in 6 months.   If you have any questions please feel free to the call the clinic at anytime at [phone]. It was a pleasure seeing you!  Best, Dr. Rayvon Char

## 2024-01-27 NOTE — Assessment & Plan Note (Signed)
 Current medications include Farxiga 10 mg daily and semaglutide 0.5 mg weekly. Last A1c on 12/10/23 was 6.1. Notes good compliance. Will hold off on medication adjustments at this moment as patient is currently receiving chemotherapy. May consider increasing semaglutide at next visit if patient continues to tolerate her current dose. Will also aim to collect urine microalbumin at next visit.  - Continue Farxiga 10 mg daily and semaglutide 0.5 mg weekly - Urine microalbumin at next visit

## 2024-01-29 ENCOUNTER — Other Ambulatory Visit: Payer: Self-pay

## 2024-02-03 NOTE — Progress Notes (Signed)
 Internal Medicine Clinic Attending  Case discussed with the resident at the time of the visit.  We reviewed the resident's history and exam and pertinent patient test results.  I agree with the assessment, diagnosis, and plan of care documented in the resident's note.

## 2024-02-05 ENCOUNTER — Ambulatory Visit: Payer: Self-pay

## 2024-02-05 NOTE — Patient Outreach (Signed)
 Care Coordination   Follow Up Visit Note   02/05/2024 Name: Nicole [name] MRN: [id number] DOB: 05-11-1961  Nicole [name] is a 63 y.o. year old female who sees [name], MD for primary care. I spoke with  Nicole [name] by phone today.  What matters to the patients health and wellness today?  I have consulted with Nicole [name], who states she is doing well. On 01/27/24, Her blood pressure was 125/68, her pulse was 102, and on January 28th was 6.1. She did not check her blood sugar or blood pressure today and did not have her log because she was not home. I advised her to check her blood sugars and pressure to determine her levels and status. She has an appointment with Dr. Mosetta Putt on 4/2, her oncologist. I also informed her that since she has been doing well on our next call, it will be our last visit.         Goals Addressed             This Visit's Progress    I want to gain knowledge to manage and maintain my health condition HTN and Type II DM       Patient Goals/Self Care Activities: -Patient/Caregiver will self-administer medications as prescribed as evidenced by self-report/primary caregiver report  -Patient/Caregiver will attend all scheduled provider appointments as evidenced by clinician review of documented attendance to scheduled appointments and patient/caregiver report -Patient/Caregiver will call provider office for new concerns or questions as evidenced by review of documented incoming telephone call notes and patient report -Calls provider office for new concerns, questions, or BP outside discussed parameters -Checks BP and records as discussed -continue to cut down on smoking -drink more water -Continue to walk more with walker to build strength -Monitor the elevations in blood pressure and call physician if they continue  Reviewed medications with patient and discussed importance of medication adherence Counseled on importance of regular laboratory monitoring as  prescribed Provided patient with written educational materials related to hypo and hyperglycemia and importance of correct treatment Review of patient status, including review of consultants reports, relevant laboratory and other test results, and medications completed Lab Results  Component Value Date   HGBA1C 6.1 (H) 12/10/2023   Last practice recorded BP readings:  BP Readings from Last 3 Encounters:  01/27/24 125/68  01/24/24 (!) 149/68  01/22/24 (!) 151/83   Most recent eGFR/CrCl:  Lab Results  Component Value Date   EGFR 100 05/14/2023    No components found for: "CRCL"           SDOH assessments and interventions completed:  No  Care Coordination Interventions:  Yes, provided  Interventions Today    Flowsheet Row Most Recent Value  Chronic Disease   Chronic disease during today's visit Diabetes, Hypertension (HTN)  General Interventions   General Interventions Discussed/Reviewed General Interventions Discussed, General Interventions Reviewed, Doctor Visits  Doctor Visits Discussed/Reviewed Doctor Visits Discussed  Pharmacy Interventions   Pharmacy Dicussed/Reviewed Pharmacy Topics Discussed  Safety Interventions   Safety Discussed/Reviewed Safety Discussed        Follow up plan: Follow up call scheduled for 04/07/24  10 am    Encounter Outcome:  Patient Visit Completed   [name] RN, BSN, [facility]  [facility], [facility]  Care Coordinator Phone: [phone]

## 2024-02-05 NOTE — Patient Instructions (Signed)
 Visit Information  Thank you for taking time to visit with me today. Please don't hesitate to contact me if I can be of assistance to you.   Following are the goals we discussed today:   Goals Addressed             This Visit's Progress    I want to gain knowledge to manage and maintain my health condition HTN and Type II DM       Patient Goals/Self Care Activities: -Patient/Caregiver will self-administer medications as prescribed as evidenced by self-report/primary caregiver report  -Patient/Caregiver will attend all scheduled provider appointments as evidenced by clinician review of documented attendance to scheduled appointments and patient/caregiver report -Patient/Caregiver will call provider office for new concerns or questions as evidenced by review of documented incoming telephone call notes and patient report -Calls provider office for new concerns, questions, or BP outside discussed parameters -Checks BP and records as discussed -continue to cut down on smoking -drink more water -Continue to walk more with walker to build strength -Monitor the elevations in blood pressure and call physician if they continue  Reviewed medications with patient and discussed importance of medication adherence Counseled on importance of regular laboratory monitoring as prescribed Provided patient with written educational materials related to hypo and hyperglycemia and importance of correct treatment Review of patient status, including review of consultants reports, relevant laboratory and other test results, and medications completed Lab Results  Component Value Date   HGBA1C 6.1 (H) 12/10/2023   Last practice recorded BP readings:  BP Readings from Last 3 Encounters:  01/27/24 125/68  01/24/24 (!) 149/68  01/22/24 (!) 151/83   Most recent eGFR/CrCl:  Lab Results  Component Value Date   EGFR 100 05/14/2023    No components found for: "CRCL"           Our next appointment is by  telephone on 04/07/24 at 10 am 04/07/24 Please call the care guide team at [phone] if you need to cancel or reschedule your appointment.   If you are experiencing a Mental Health or Behavioral Health Crisis or need someone to talk to, please call 1-800-273-TALK (toll free, 24 hour hotline)  The patient verbalized understanding of instructions, educational materials, and care plan provided today and DECLINED offer to receive copy of patient instructions, educational materials, and care plan.   [name] RN, BSN, [facility]   [facility], [facility]  Care Coordinator Phone: [phone]

## 2024-02-11 NOTE — Assessment & Plan Note (Signed)
-  [id number], stag I, G3, ER 20% WEAK, PR-, HER- (0), ki67 60%, with extensive DCIS -Diagnosed in October 2024 -I reviewed his surgical pathology findings, which showed DCIS, 8 mm invasive ductal carcinoma, repeated prognostic panel showed ER 20% weak positive, PR and HER2 negative with high Ki-67 60%, grade 3. -Due to the aggressive nature of her functional triple negative disease, I recommend her to consider adjuvant chemotherapy -Due to the positive margin, she proceeded with re-excision surgery on 11/13/2022 which showed additional 1cm invasive cancer, final margins were negative  -I recommend adjuvant chemo TC every 3 weeks for 4 cycles with G-CSF on day 3, she started on 12/11/2023.  Due to drug-drug interaction with her HIV medication, I reduced docetaxel dose by 50%.

## 2024-02-12 ENCOUNTER — Inpatient Hospital Stay: Payer: 59

## 2024-02-12 ENCOUNTER — Telehealth: Payer: Self-pay | Admitting: Radiation Oncology

## 2024-02-12 ENCOUNTER — Encounter: Payer: Self-pay | Admitting: *Deleted

## 2024-02-12 ENCOUNTER — Inpatient Hospital Stay (HOSPITAL_BASED_OUTPATIENT_CLINIC_OR_DEPARTMENT_OTHER): Payer: 59 | Admitting: Hematology

## 2024-02-12 ENCOUNTER — Inpatient Hospital Stay: Payer: 59 | Attending: Hematology

## 2024-02-12 VITALS — BP 140/75 | HR 83 | Temp 98.0°F | Resp 17 | Wt 210.9 lb

## 2024-02-12 DIAGNOSIS — Z21 Asymptomatic human immunodeficiency virus [HIV] infection status: Secondary | ICD-10-CM | POA: Diagnosis not present

## 2024-02-12 DIAGNOSIS — Z8673 Personal history of transient ischemic attack (TIA), and cerebral infarction without residual deficits: Secondary | ICD-10-CM | POA: Insufficient documentation

## 2024-02-12 DIAGNOSIS — Z5189 Encounter for other specified aftercare: Secondary | ICD-10-CM | POA: Diagnosis not present

## 2024-02-12 DIAGNOSIS — Z5111 Encounter for antineoplastic chemotherapy: Secondary | ICD-10-CM | POA: Insufficient documentation

## 2024-02-12 DIAGNOSIS — Z95828 Presence of other vascular implants and grafts: Secondary | ICD-10-CM

## 2024-02-12 DIAGNOSIS — Z170 Estrogen receptor positive status [ER+]: Secondary | ICD-10-CM

## 2024-02-12 DIAGNOSIS — R634 Abnormal weight loss: Secondary | ICD-10-CM | POA: Insufficient documentation

## 2024-02-12 DIAGNOSIS — Z7952 Long term (current) use of systemic steroids: Secondary | ICD-10-CM | POA: Insufficient documentation

## 2024-02-12 DIAGNOSIS — Z881 Allergy status to other antibiotic agents status: Secondary | ICD-10-CM | POA: Diagnosis not present

## 2024-02-12 DIAGNOSIS — N6489 Other specified disorders of breast: Secondary | ICD-10-CM | POA: Diagnosis not present

## 2024-02-12 DIAGNOSIS — Z79899 Other long term (current) drug therapy: Secondary | ICD-10-CM | POA: Diagnosis not present

## 2024-02-12 DIAGNOSIS — F419 Anxiety disorder, unspecified: Secondary | ICD-10-CM | POA: Insufficient documentation

## 2024-02-12 DIAGNOSIS — I10 Essential (primary) hypertension: Secondary | ICD-10-CM | POA: Insufficient documentation

## 2024-02-12 DIAGNOSIS — Z90710 Acquired absence of both cervix and uterus: Secondary | ICD-10-CM | POA: Insufficient documentation

## 2024-02-12 DIAGNOSIS — C50412 Malignant neoplasm of upper-outer quadrant of left female breast: Secondary | ICD-10-CM

## 2024-02-12 DIAGNOSIS — E119 Type 2 diabetes mellitus without complications: Secondary | ICD-10-CM | POA: Diagnosis not present

## 2024-02-12 DIAGNOSIS — Z79624 Long term (current) use of inhibitors of nucleotide synthesis: Secondary | ICD-10-CM | POA: Diagnosis not present

## 2024-02-12 DIAGNOSIS — Z1722 Progesterone receptor negative status: Secondary | ICD-10-CM | POA: Insufficient documentation

## 2024-02-12 DIAGNOSIS — Z8619 Personal history of other infectious and parasitic diseases: Secondary | ICD-10-CM | POA: Diagnosis not present

## 2024-02-12 DIAGNOSIS — Z1732 Human epidermal growth factor receptor 2 negative status: Secondary | ICD-10-CM | POA: Diagnosis not present

## 2024-02-12 LAB — CBC WITH DIFFERENTIAL (CANCER CENTER ONLY)
Abs Immature Granulocytes: 0.05 10*3/uL (ref 0.00–0.07)
Basophils Absolute: 0 10*3/uL (ref 0.0–0.1)
Basophils Relative: 0 %
Eosinophils Absolute: 0 10*3/uL (ref 0.0–0.5)
Eosinophils Relative: 0 %
HCT: 37 % (ref 36.0–46.0)
Hemoglobin: 12.5 g/dL (ref 12.0–15.0)
Immature Granulocytes: 1 %
Lymphocytes Relative: 9 %
Lymphs Abs: 0.7 10*3/uL (ref 0.7–4.0)
MCH: 29.5 pg (ref 26.0–34.0)
MCHC: 33.8 g/dL (ref 30.0–36.0)
MCV: 87.3 fL (ref 80.0–100.0)
Monocytes Absolute: 0.1 10*3/uL (ref 0.1–1.0)
Monocytes Relative: 2 %
Neutro Abs: 7.7 10*3/uL (ref 1.7–7.7)
Neutrophils Relative %: 88 %
Platelet Count: 236 10*3/uL (ref 150–400)
RBC: 4.24 MIL/uL (ref 3.87–5.11)
RDW: 15.7 % — ABNORMAL HIGH (ref 11.5–15.5)
WBC Count: 8.7 10*3/uL (ref 4.0–10.5)
nRBC: 0 % (ref 0.0–0.2)

## 2024-02-12 LAB — CMP (CANCER CENTER ONLY)
ALT: 15 U/L (ref 0–44)
AST: 14 U/L — ABNORMAL LOW (ref 15–41)
Albumin: 4.2 g/dL (ref 3.5–5.0)
Alkaline Phosphatase: 74 U/L (ref 38–126)
Anion gap: 7 (ref 5–15)
BUN: 15 mg/dL (ref 8–23)
CO2: 28 mmol/L (ref 22–32)
Calcium: 10 mg/dL (ref 8.9–10.3)
Chloride: 105 mmol/L (ref 98–111)
Creatinine: 0.62 mg/dL (ref 0.44–1.00)
GFR, Estimated: >60 mL/min (ref >60)
Glucose, Bld: 252 mg/dL — ABNORMAL HIGH (ref 70–99)
Potassium: 4 mmol/L (ref 3.5–5.1)
Sodium: 140 mmol/L (ref 135–145)
Total Bilirubin: 0.5 mg/dL (ref 0.0–1.2)
Total Protein: 8.6 g/dL — ABNORMAL HIGH (ref 6.5–8.1)

## 2024-02-12 MED ORDER — SODIUM CHLORIDE 0.9 % IV SOLN
600.0000 mg/m2 | Freq: Once | INTRAVENOUS | Status: AC
  Administered 2024-02-12: 1300 mg via INTRAVENOUS
  Filled 2024-02-12: qty 65

## 2024-02-12 MED ORDER — SODIUM CHLORIDE 0.9% FLUSH
10.0000 mL | Freq: Once | INTRAVENOUS | Status: AC
  Administered 2024-02-12: 10 mL

## 2024-02-12 MED ORDER — SODIUM CHLORIDE 0.9 % IV SOLN: INTRAVENOUS | Status: DC

## 2024-02-12 MED ORDER — PALONOSETRON HCL INJECTION 0.25 MG/5ML
0.2500 mg | Freq: Once | INTRAVENOUS | Status: AC
  Administered 2024-02-12: 0.25 mg via INTRAVENOUS
  Filled 2024-02-12: qty 5

## 2024-02-12 MED ORDER — DEXAMETHASONE SODIUM PHOSPHATE 10 MG/ML IJ SOLN
10.0000 mg | Freq: Once | INTRAMUSCULAR | Status: AC
Start: 2024-02-12 — End: 2024-02-12
  Administered 2024-02-12: 10 mg via INTRAVENOUS
  Filled 2024-02-12: qty 1

## 2024-02-12 MED ORDER — SODIUM CHLORIDE 0.9 % IV SOLN
37.5000 mg/m2 | Freq: Once | INTRAVENOUS | Status: AC
  Administered 2024-02-12: 80 mg via INTRAVENOUS
  Filled 2024-02-12: qty 8

## 2024-02-12 NOTE — Patient Instructions (Signed)
 [facility] - A DEPT OF [facility]  Discharge Instructions: Thank you for choosing [facility] to provide your oncology and hematology care.   If you have a lab appointment with the Cancer Center, please go directly to the Cancer Center and check in at the registration area.   Wear comfortable clothing and clothing appropriate for easy access to any Portacath or PICC line.   We strive to give you quality time with your provider. You may need to reschedule your appointment if you arrive late (15 or more minutes).  Arriving late affects you and other patients whose appointments are after yours.  Also, if you miss three or more appointments without notifying the office, you may be dismissed from the clinic at the provider's discretion.      For prescription refill requests, have your pharmacy contact our office and allow 72 hours for refills to be completed.    Today you received the following chemotherapy and/or immunotherapy agents: Docetaxel (Taxotere) and Cyclophosphamide (Cytoxan)       To help prevent nausea and vomiting after your treatment, we encourage you to take your nausea medication as directed.  BELOW ARE SYMPTOMS THAT SHOULD BE REPORTED IMMEDIATELY: *FEVER GREATER THAN 100.4 F (38 C) OR HIGHER *CHILLS OR SWEATING *NAUSEA AND VOMITING THAT IS NOT CONTROLLED WITH YOUR NAUSEA MEDICATION *UNUSUAL SHORTNESS OF BREATH *UNUSUAL BRUISING OR BLEEDING *URINARY PROBLEMS (pain or burning when urinating, or frequent urination) *BOWEL PROBLEMS (unusual diarrhea, constipation, pain near the anus) TENDERNESS IN MOUTH AND THROAT WITH OR WITHOUT PRESENCE OF ULCERS (sore throat, sores in mouth, or a toothache) UNUSUAL RASH, SWELLING OR PAIN  UNUSUAL VAGINAL DISCHARGE OR ITCHING   Items with * indicate a potential emergency and should be followed up as soon as possible or go to the Emergency Department if any problems should occur.  Please show  the CHEMOTHERAPY ALERT CARD or IMMUNOTHERAPY ALERT CARD at check-in to the Emergency Department and triage nurse.  Should you have questions after your visit or need to cancel or reschedule your appointment, please contact [facility] - A DEPT OF [facility]  Dept: [phone]  and follow the prompts.  Office hours are 8:00 a.m. to 4:30 p.m. Monday - Friday. Please note that voicemails left after 4:00 p.m. may not be returned until the following business day.  We are closed weekends and major holidays. You have access to a nurse at all times for urgent questions. Please call the main number to the clinic Dept: [phone] and follow the prompts.   For any non-urgent questions, you may also contact your provider using MyChart. We now offer e-Visits for anyone 48 and older to request care online for non-urgent symptoms. For details visit [email or web address].   Also download the MyChart app! Go to the app store, search "MyChart", open the app, select [place], and log in with your MyChart username and password.

## 2024-02-12 NOTE — Telephone Encounter (Signed)
 Called patient to schedule a FUN visit w. PA Laurence Aly. Patient requested a call back at  a later time.
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
## 2024-02-13 ENCOUNTER — Telehealth: Payer: Self-pay | Admitting: Radiation Oncology

## 2024-02-13 NOTE — Telephone Encounter (Signed)
 Mailed patient Deep Inspiration Preparation document.

## 2024-02-14 ENCOUNTER — Inpatient Hospital Stay: Payer: 59

## 2024-02-14 VITALS — BP 127/74 | Resp 16

## 2024-02-14 DIAGNOSIS — Z7952 Long term (current) use of systemic steroids: Secondary | ICD-10-CM | POA: Diagnosis not present

## 2024-02-14 DIAGNOSIS — Z79624 Long term (current) use of inhibitors of nucleotide synthesis: Secondary | ICD-10-CM | POA: Diagnosis not present

## 2024-02-14 DIAGNOSIS — Z8673 Personal history of transient ischemic attack (TIA), and cerebral infarction without residual deficits: Secondary | ICD-10-CM | POA: Diagnosis not present

## 2024-02-14 DIAGNOSIS — Z5189 Encounter for other specified aftercare: Secondary | ICD-10-CM | POA: Diagnosis not present

## 2024-02-14 DIAGNOSIS — E119 Type 2 diabetes mellitus without complications: Secondary | ICD-10-CM | POA: Diagnosis not present

## 2024-02-14 DIAGNOSIS — C50412 Malignant neoplasm of upper-outer quadrant of left female breast: Secondary | ICD-10-CM

## 2024-02-14 DIAGNOSIS — R634 Abnormal weight loss: Secondary | ICD-10-CM | POA: Diagnosis not present

## 2024-02-14 DIAGNOSIS — N6489 Other specified disorders of breast: Secondary | ICD-10-CM | POA: Diagnosis not present

## 2024-02-14 DIAGNOSIS — Z881 Allergy status to other antibiotic agents status: Secondary | ICD-10-CM | POA: Diagnosis not present

## 2024-02-14 DIAGNOSIS — I10 Essential (primary) hypertension: Secondary | ICD-10-CM | POA: Diagnosis not present

## 2024-02-14 DIAGNOSIS — Z170 Estrogen receptor positive status [ER+]: Secondary | ICD-10-CM | POA: Diagnosis not present

## 2024-02-14 DIAGNOSIS — Z79899 Other long term (current) drug therapy: Secondary | ICD-10-CM | POA: Diagnosis not present

## 2024-02-14 DIAGNOSIS — Z1732 Human epidermal growth factor receptor 2 negative status: Secondary | ICD-10-CM | POA: Diagnosis not present

## 2024-02-14 DIAGNOSIS — Z8619 Personal history of other infectious and parasitic diseases: Secondary | ICD-10-CM | POA: Diagnosis not present

## 2024-02-14 DIAGNOSIS — Z5111 Encounter for antineoplastic chemotherapy: Secondary | ICD-10-CM | POA: Diagnosis not present

## 2024-02-14 DIAGNOSIS — Z1722 Progesterone receptor negative status: Secondary | ICD-10-CM | POA: Diagnosis not present

## 2024-02-14 MED ORDER — PEGFILGRASTIM-CBQV 6 MG/0.6ML ~~LOC~~ SOSY
6.0000 mg | PREFILLED_SYRINGE | Freq: Once | SUBCUTANEOUS | Status: AC
Start: 2024-02-14 — End: 2024-02-14
  Administered 2024-02-14: 6 mg via SUBCUTANEOUS
  Filled 2024-02-14: qty 0.6

## 2024-02-24 ENCOUNTER — Encounter: Payer: Self-pay | Admitting: *Deleted

## 2024-03-03 NOTE — Progress Notes (Signed)
 Nursing interview for a diagnosis of Ductal carcinoma in situ (DCIS) of left breast. Diagnoses of Malignant neoplasm of upper-inner quadrant of left female breast, unspecified estrogen receptor status (HCC) and Malignant neoplasm of upper-outer quadrant of left breast in female, estrogen receptor positive (HCC).    Patient identity verified x2.  Intent: Curative Location: LT breast  Patient seen in the multidisciplinary breast clinic 08/15/2023.  Patient states "No issues."  Pain: Denies ROM: LUE- Limited due to previous stroke Lymphedema: Denies   Cardiac/Lung issue: Denies  Appetite: Good  All related body systems reviewed w/ patient. Patient denies any related issues at this time.  Meaningful use complete.  Additional Complaints / other details:   Chemotherapy complete 02/12/2024 Pregnancy: NO, patient had Hysterectomy Patient positive for HIV and Hepatitis. Oncologist Dr. Maryalice Smaller.  Vitals- BP 114/69 (BP Location: Right Arm, Patient Position: Sitting, Cuff Size: Large)   Pulse 77   Temp (!) 97.3 F (36.3 C) (Temporal)   Resp 20   Ht 5\' 7"  (1.702 m)   Wt 220 lb (99.8 kg)   SpO2 96%   BMI 34.46 kg/m   This concludes the interaction.  [name], LPN

## 2024-03-04 ENCOUNTER — Ambulatory Visit: Admitting: Radiation Oncology

## 2024-03-04 ENCOUNTER — Ambulatory Visit
Admission: RE | Admit: 2024-03-04 | Discharge: 2024-03-04 | Disposition: A | Discharge status: Home / Self Care | Source: Ambulatory Visit | Attending: Radiation Oncology | Admitting: Radiation Oncology

## 2024-03-04 ENCOUNTER — Encounter: Payer: Self-pay | Admitting: Radiation Oncology

## 2024-03-04 VITALS — BP 114/69 | HR 77 | Temp 97.3°F | Resp 20 | Ht 67.0 in | Wt 220.0 lb

## 2024-03-04 DIAGNOSIS — Z7984 Long term (current) use of oral hypoglycemic drugs: Secondary | ICD-10-CM | POA: Diagnosis not present

## 2024-03-04 DIAGNOSIS — Z79899 Other long term (current) drug therapy: Secondary | ICD-10-CM | POA: Diagnosis not present

## 2024-03-04 DIAGNOSIS — B20 Human immunodeficiency virus [HIV] disease: Secondary | ICD-10-CM | POA: Diagnosis not present

## 2024-03-04 DIAGNOSIS — Z8673 Personal history of transient ischemic attack (TIA), and cerebral infarction without residual deficits: Secondary | ICD-10-CM | POA: Diagnosis not present

## 2024-03-04 DIAGNOSIS — T8143XA Infection following a procedure, organ and space surgical site, initial encounter: Secondary | ICD-10-CM | POA: Insufficient documentation

## 2024-03-04 DIAGNOSIS — Z79624 Long term (current) use of inhibitors of nucleotide synthesis: Secondary | ICD-10-CM | POA: Diagnosis not present

## 2024-03-04 DIAGNOSIS — K219 Gastro-esophageal reflux disease without esophagitis: Secondary | ICD-10-CM | POA: Diagnosis not present

## 2024-03-04 DIAGNOSIS — M1611 Unilateral primary osteoarthritis, right hip: Secondary | ICD-10-CM | POA: Diagnosis not present

## 2024-03-04 DIAGNOSIS — C50412 Malignant neoplasm of upper-outer quadrant of left female breast: Secondary | ICD-10-CM | POA: Diagnosis not present

## 2024-03-04 DIAGNOSIS — I10 Essential (primary) hypertension: Secondary | ICD-10-CM | POA: Insufficient documentation

## 2024-03-04 DIAGNOSIS — Z7982 Long term (current) use of aspirin: Secondary | ICD-10-CM | POA: Diagnosis not present

## 2024-03-04 DIAGNOSIS — D0512 Intraductal carcinoma in situ of left breast: Secondary | ICD-10-CM

## 2024-03-04 DIAGNOSIS — F17210 Nicotine dependence, cigarettes, uncomplicated: Secondary | ICD-10-CM | POA: Diagnosis not present

## 2024-03-04 DIAGNOSIS — Z170 Estrogen receptor positive status [ER+]: Secondary | ICD-10-CM | POA: Diagnosis not present

## 2024-03-04 DIAGNOSIS — B182 Chronic viral hepatitis C: Secondary | ICD-10-CM | POA: Diagnosis not present

## 2024-03-04 DIAGNOSIS — E119 Type 2 diabetes mellitus without complications: Secondary | ICD-10-CM | POA: Diagnosis not present

## 2024-03-04 DIAGNOSIS — Z7985 Long-term (current) use of injectable non-insulin antidiabetic drugs: Secondary | ICD-10-CM | POA: Insufficient documentation

## 2024-03-04 DIAGNOSIS — C50212 Malignant neoplasm of upper-inner quadrant of left female breast: Secondary | ICD-10-CM | POA: Diagnosis not present

## 2024-03-04 MED ORDER — DOXYCYCLINE HYCLATE 100 MG PO TABS: 100.0000 mg | ORAL_TABLET | Freq: Two times a day (BID) | ORAL | 0 refills | Status: DC
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
## 2024-03-07 ENCOUNTER — Encounter (HOSPITAL_COMMUNITY): Payer: Self-pay | Admitting: Emergency Medicine

## 2024-03-07 ENCOUNTER — Emergency Department (HOSPITAL_COMMUNITY)
Admission: EM | Admit: 2024-03-07 | Discharge: 2024-03-07 | Disposition: A | Discharge status: Home / Self Care | Attending: Emergency Medicine | Admitting: Emergency Medicine

## 2024-03-07 ENCOUNTER — Other Ambulatory Visit: Payer: Self-pay

## 2024-03-07 DIAGNOSIS — G8911 Acute pain due to trauma: Secondary | ICD-10-CM | POA: Diagnosis not present

## 2024-03-07 DIAGNOSIS — R609 Edema, unspecified: Secondary | ICD-10-CM | POA: Diagnosis not present

## 2024-03-07 DIAGNOSIS — Z7982 Long term (current) use of aspirin: Secondary | ICD-10-CM | POA: Diagnosis not present

## 2024-03-07 DIAGNOSIS — L89612 Pressure ulcer of right heel, stage 2: Secondary | ICD-10-CM | POA: Diagnosis not present

## 2024-03-07 DIAGNOSIS — M79671 Pain in right foot: Secondary | ICD-10-CM | POA: Diagnosis present

## 2024-03-07 NOTE — Discharge Instructions (Signed)
 1.  Your pressure wound has been cleaned and dressed.  Try to schedule an appointment with podiatry or wound care soon as possible.  You should get a follow-up with your primary care doctor as well. 2.  At this time the wound does not show signs of infection.  You are already taking doxycycline  for another reason.  Take this prescription as previously prescribed. 3.  Try to keep all pressure off of the heels much as possible. 4.  Return to the emergency department if you start seeing redness of the foot or leg, fever or signs of infection.

## 2024-03-07 NOTE — ED Triage Notes (Signed)
 Pt bib EMS from home. Pt complaints of right foot pain after taking a bath and scrubbing part of foot and skin came off. Pt denies N/V/D. Pt does present with edema in right leg up to thigh as well as edema in left leg up to knee. EMS CBG 120.

## 2024-03-07 NOTE — ED Provider Notes (Signed)
 [place] EMERGENCY DEPARTMENT AT [facility] Provider Note   CSN: [id number] Arrival date & time: 03/07/24  1517     History  Chief Complaint  Patient presents with   Foot Pain   Foot Injury    Nicole [name] is a 63 y.o. female.  HPI Patient was soaking and washing her feet with her caregiver.  Apparently she was using a brush or pumice on the feet and a big piece of skin scraped up.  Patient denies that it is very painful.  There was no bleeding.  Patient has very limited mobility.  She is mostly in a wheelchair seated with her feet against the pedals.  Patient is otherwise not been ill.  She has not had fevers chills or general illness.  She reports she is otherwise been at baseline and that blood sugars have been controlled.  Patient has seen podiatry in the past but has not been to them for a while.  She is seen an internal medicine [employer] [facility].    Home Medications Prior to Admission medications   Medication Sig Start Date End Date Taking? Authorizing Provider  Accu-Chek FastClix Lancets MISC Check blood sugar one time a day 12/13/22   Alyce Jubilee, MD  aspirin  EC 81 MG tablet Take 1 tablet (81 mg total) by mouth daily. Swallow whole. 01/27/24   Maxie Spaniel, MD  Blood Glucose Monitoring Suppl (ACCU-CHEK GUIDE ME) w/Device KIT 1 m by Does not apply route every morning. 03/05/22   Katsadouros, Vasilios, MD  Blood Pressure Monitoring (BLOOD PRESSURE CUFF) MISC 1 each by Does not apply route 2 (two) times daily. 12/04/22   Katsadouros, Vasilios, MD  buPROPion  (WELLBUTRIN  SR) 150 MG 12 hr tablet Take 1 tablet (150 mg total) by mouth daily. 01/27/24   Maxie Spaniel, MD  dapagliflozin  propanediol (FARXIGA ) 10 MG TABS tablet Take 1 tablet (10 mg total) by mouth daily. 01/27/24   Maxie Spaniel, MD  dexamethasone  (DECADRON ) 4 MG tablet Take 2 tabs by mouth 2 times daily starting day before chemo. Then take 2 tabs daily for 2 days starting day after chemo.  Take with food. 12/09/23   Sonja Malone, MD  doxycycline  (VIBRA -TABS) 100 MG tablet Take 1 tablet (100 mg total) by mouth 2 (two) times daily. 03/04/24   Bettejane Brownie, PA-C  elvitegravir-cobicistat-emtricitabine -tenofovir  (GENVOYA ) 150-150-200-10 MG TABS tablet Take 1 tablet by mouth daily with breakfast. 01/27/24   Maxie Spaniel, MD  escitalopram  (LEXAPRO ) 20 MG tablet Take 1 tablet (20 mg total) by mouth daily. 01/27/24   Maxie Spaniel, MD  glucose blood (ACCU-CHEK GUIDE) test strip Check blood sugar one time daily 12/13/22   Katsadouros, Vasilios, MD  ibuprofen  (ADVIL ) 800 MG tablet Take 1 tablet (800 mg total) by mouth every 8 (eight) hours as needed. 09/04/23   Cornett, Andy Bannister, MD  lidocaine -prilocaine  (EMLA ) cream Apply to affected area once 12/09/23   Sonja [place], MD  Olmesartan -amLODIPine -HCTZ 40-5-12.5 MG TABS Take 1 tablet by mouth daily. 01/27/24   Maxie Spaniel, MD  omeprazole  (PRILOSEC ) 20 MG capsule Take 1 capsule (20 mg total) by mouth daily. 01/27/24   Maxie Spaniel, MD  ondansetron  (ZOFRAN ) 8 MG tablet Take 1 tablet (8 mg total) by mouth every 8 (eight) hours as needed for nausea or vomiting. Start on the third day after chemotherapy. 12/09/23   Sonja , MD  oxyCODONE  (OXY IR/ROXICODONE ) 5 MG immediate release tablet Take 1 tablet (5 mg total) by mouth every 6 (six) hours as  needed for severe pain (pain score 7-10). 11/14/23   Cornett, Andy Bannister, MD  potassium chloride  SA (KLOR-CON  M) 20 MEQ tablet Take 1 tablet (20 mEq total) by mouth daily. 12/04/23   Sonja [place], MD  prochlorperazine  (COMPAZINE ) 10 MG tablet Take 1 tablet (10 mg total) by mouth every 6 (six) hours as needed for nausea or vomiting. 12/09/23   Sonja , MD  rosuvastatin  (CRESTOR ) 20 MG tablet Take 1 tablet (20 mg total) by mouth daily. 01/27/24   Maxie Spaniel, MD  Semaglutide ,0.25 or 0.5MG /DOS, 2 MG/3ML SOPN Inject 0.5 mg into the skin once a week. 01/27/24   Maxie Spaniel, MD      Allergies    Cephalexin    Review of  Systems   Review of Systems  Physical Exam Updated Vital Signs BP 115/66   Pulse 80   Temp 98.1 F (36.7 C) (Oral)   Resp 18   Ht 5\' 7"  (1.702 m)   Wt 99.8 kg   SpO2 99%   BMI 34.46 kg/m  Physical Exam Constitutional:      Comments: Alert nontoxic.  Mental status clear.  HENT:     Mouth/Throat:     Pharynx: Oropharynx is clear.  Eyes:     Extraocular Movements: Extraocular movements intact.  Cardiovascular:     Rate and Rhythm: Normal rate and regular rhythm.  Pulmonary:     Effort: Pulmonary effort is normal.     Breath sounds: Normal breath sounds.  Abdominal:     General: There is no distension.     Palpations: Abdomen is soft.  Musculoskeletal:     Comments: Right lower extremity 1+ to 2+ pitting edema.  There is a pressure wound on the heel that is approximately 6 cm.  There is epidermal layer of sloughing.  The dermis below is intact.  No purulent material.  Findings consistent with a pressure ulcer.  Close examination of left foot shows early development of pressure wound on the foot.  Skin:    General: Skin is warm and dry.  Neurological:     Comments: Patient is alert.  She is answering questions.  Psychiatric:        Mood and Affect: Mood normal.          ED Results / Procedures / Treatments   Labs (all labs ordered are listed, but only abnormal results are displayed) Labs Reviewed - No data to display  EKG None  Radiology No results found.  Procedures Procedures   Pressure wound was cleaned with wound cleanser for pressure wounds.  Sterile equipment used to debride devitalized dermis.  Cleaned well.  No purulent drainage no exudate.  DuoDERM covering placed. Medications Ordered in ED Medications - No data to display  ED Course/ Medical Decision Making/ A&P                                 Medical Decision Making  Patient presents as outlined.  She has a pressure wound on her heel.  At this time no signs of secondary infection.  Patient  is otherwise clinically well at this time.  She is already prescribed doxycycline  for her postoperative breast wound.  I have instructed them to continue this as previously prescribed.  Heel wound is clean.  At this time stable for continued outpatient management.  Patient's sister is her caregiver.  Patient's sister is familiar with home care and will get follow-up appointments  as set for wound management and change positioning to make sure there is no ongoing pressure wound.        Final Clinical Impression(s) / ED Diagnoses Final diagnoses:  Pressure injury of right heel, stage 2 (HCC)    Rx / DC Orders ED Discharge Orders     None         Wynetta Heckle, MD 03/07/24 1857

## 2024-03-09 ENCOUNTER — Encounter: Payer: Self-pay | Admitting: Hematology

## 2024-03-11 ENCOUNTER — Telehealth: Payer: Self-pay | Admitting: Radiation Oncology

## 2024-03-11 ENCOUNTER — Inpatient Hospital Stay

## 2024-03-11 ENCOUNTER — Inpatient Hospital Stay: Admitting: Hematology

## 2024-03-11 NOTE — Assessment & Plan Note (Deleted)
-  pT1bN0M0, stag I, G3, ER 20% WEAK, PR-, HER- (0), ki67 60%, with extensive DCIS -Diagnosed in October 2024 -I reviewed his surgical pathology findings, which showed DCIS, 8 mm invasive ductal carcinoma, repeated prognostic panel showed ER 20% weak positive, PR and HER2 negative with high Ki-67 60%, grade 3. -Due to the aggressive nature of her functional triple negative disease, I recommend her to consider adjuvant chemotherapy -Due to the positive margin, she proceeded with re-excision surgery on 11/13/2022 which showed additional 1cm invasive cancer, final margins were negative  -I recommend adjuvant chemo TC every 3 weeks for 4 cycles with G-CSF on day 3, she started on 12/11/2023.  Due to drug-drug interaction with her HIV medication, I reduced docetaxel  dose by 50%. She completed on 02/12/2024, overall tolerated well

## 2024-03-11 NOTE — Telephone Encounter (Signed)
 I called to check on the patient. She has an appt today with Dr. Maryalice Smaller and has been taking her oral antibiotic I called in for her. She is still having some yellow drainage and has not seen Dr. Afton Horse. I will ask Dr. Maryalice Smaller to let me know when she's here and we can evaluate the area today. It sounds though like there is more healing to take place before we simulation for radiation.

## 2024-03-12 ENCOUNTER — Encounter: Payer: Self-pay | Admitting: *Deleted

## 2024-03-18 ENCOUNTER — Ambulatory Visit (INDEPENDENT_AMBULATORY_CARE_PROVIDER_SITE_OTHER): Admitting: Podiatry

## 2024-03-18 ENCOUNTER — Inpatient Hospital Stay: Admitting: Hematology

## 2024-03-18 ENCOUNTER — Inpatient Hospital Stay: Attending: Hematology

## 2024-03-18 VITALS — BP 115/66 | HR 80 | Temp 98.2°F | Resp 18 | Ht 67.0 in | Wt 216.1 lb

## 2024-03-18 DIAGNOSIS — M1611 Unilateral primary osteoarthritis, right hip: Secondary | ICD-10-CM | POA: Insufficient documentation

## 2024-03-18 DIAGNOSIS — Z170 Estrogen receptor positive status [ER+]: Secondary | ICD-10-CM

## 2024-03-18 DIAGNOSIS — E119 Type 2 diabetes mellitus without complications: Secondary | ICD-10-CM | POA: Insufficient documentation

## 2024-03-18 DIAGNOSIS — Z7982 Long term (current) use of aspirin: Secondary | ICD-10-CM | POA: Diagnosis not present

## 2024-03-18 DIAGNOSIS — N6489 Other specified disorders of breast: Secondary | ICD-10-CM | POA: Diagnosis not present

## 2024-03-18 DIAGNOSIS — Z79899 Other long term (current) drug therapy: Secondary | ICD-10-CM | POA: Diagnosis not present

## 2024-03-18 DIAGNOSIS — Z1732 Human epidermal growth factor receptor 2 negative status: Secondary | ICD-10-CM | POA: Diagnosis not present

## 2024-03-18 DIAGNOSIS — I10 Essential (primary) hypertension: Secondary | ICD-10-CM | POA: Insufficient documentation

## 2024-03-18 DIAGNOSIS — Z90710 Acquired absence of both cervix and uterus: Secondary | ICD-10-CM | POA: Diagnosis not present

## 2024-03-18 DIAGNOSIS — Z8673 Personal history of transient ischemic attack (TIA), and cerebral infarction without residual deficits: Secondary | ICD-10-CM | POA: Insufficient documentation

## 2024-03-18 DIAGNOSIS — C50412 Malignant neoplasm of upper-outer quadrant of left female breast: Secondary | ICD-10-CM | POA: Insufficient documentation

## 2024-03-18 DIAGNOSIS — Z5189 Encounter for other specified aftercare: Secondary | ICD-10-CM | POA: Insufficient documentation

## 2024-03-18 DIAGNOSIS — D649 Anemia, unspecified: Secondary | ICD-10-CM | POA: Diagnosis not present

## 2024-03-18 DIAGNOSIS — T8141XA Infection following a procedure, superficial incisional surgical site, initial encounter: Secondary | ICD-10-CM | POA: Insufficient documentation

## 2024-03-18 DIAGNOSIS — L97412 Non-pressure chronic ulcer of right heel and midfoot with fat layer exposed: Secondary | ICD-10-CM | POA: Diagnosis not present

## 2024-03-18 DIAGNOSIS — Z881 Allergy status to other antibiotic agents status: Secondary | ICD-10-CM | POA: Diagnosis not present

## 2024-03-18 DIAGNOSIS — Z8619 Personal history of other infectious and parasitic diseases: Secondary | ICD-10-CM | POA: Diagnosis not present

## 2024-03-18 DIAGNOSIS — Z21 Asymptomatic human immunodeficiency virus [HIV] infection status: Secondary | ICD-10-CM | POA: Insufficient documentation

## 2024-03-18 DIAGNOSIS — Z1722 Progesterone receptor negative status: Secondary | ICD-10-CM | POA: Insufficient documentation

## 2024-03-18 DIAGNOSIS — Z5111 Encounter for antineoplastic chemotherapy: Secondary | ICD-10-CM | POA: Insufficient documentation

## 2024-03-18 LAB — CBC WITH DIFFERENTIAL/PLATELET
Abs Immature Granulocytes: 0.02 10*3/uL (ref 0.00–0.07)
Basophils Absolute: 0.1 10*3/uL (ref 0.0–0.1)
Basophils Relative: 1 %
Eosinophils Absolute: 0.2 10*3/uL (ref 0.0–0.5)
Eosinophils Relative: 3 %
HCT: 34.2 % — ABNORMAL LOW (ref 36.0–46.0)
Hemoglobin: 11.4 g/dL — ABNORMAL LOW (ref 12.0–15.0)
Immature Granulocytes: 0 %
Lymphocytes Relative: 19 %
Lymphs Abs: 1.2 10*3/uL (ref 0.7–4.0)
MCH: 30.2 pg (ref 26.0–34.0)
MCHC: 33.3 g/dL (ref 30.0–36.0)
MCV: 90.5 fL (ref 80.0–100.0)
Monocytes Absolute: 0.6 10*3/uL (ref 0.1–1.0)
Monocytes Relative: 10 %
Neutro Abs: 4.1 10*3/uL (ref 1.7–7.7)
Neutrophils Relative %: 67 %
Platelets: 235 10*3/uL (ref 150–400)
RBC: 3.78 MIL/uL — ABNORMAL LOW (ref 3.87–5.11)
RDW: 15.4 % (ref 11.5–15.5)
WBC: 6.2 10*3/uL (ref 4.0–10.5)
nRBC: 0 % (ref 0.0–0.2)

## 2024-03-18 LAB — COMPREHENSIVE METABOLIC PANEL WITH GFR
ALT: 10 U/L (ref 0–44)
AST: 15 U/L (ref 15–41)
Albumin: 3.7 g/dL (ref 3.5–5.0)
Alkaline Phosphatase: 62 U/L (ref 38–126)
Anion gap: 9 (ref 5–15)
BUN: 11 mg/dL (ref 8–23)
CO2: 28 mmol/L (ref 22–32)
Calcium: 9.2 mg/dL (ref 8.9–10.3)
Chloride: 105 mmol/L (ref 98–111)
Creatinine, Ser: 0.59 mg/dL (ref 0.44–1.00)
GFR, Estimated: >60 mL/min (ref >60)
Glucose, Bld: 149 mg/dL — ABNORMAL HIGH (ref 70–99)
Potassium: 3.2 mmol/L — ABNORMAL LOW (ref 3.5–5.1)
Sodium: 142 mmol/L (ref 135–145)
Total Bilirubin: 0.3 mg/dL (ref 0.0–1.2)
Total Protein: 7.6 g/dL (ref 6.5–8.1)

## 2024-03-18 MED ORDER — DOXYCYCLINE HYCLATE 100 MG PO TABS: 100.0000 mg | ORAL_TABLET | Freq: Two times a day (BID) | ORAL | 0 refills | Status: DC
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
## 2024-03-18 NOTE — Assessment & Plan Note (Signed)
-  pT1bN0M0, stag I, G3, ER 20% WEAK, PR-, HER- (0), ki67 60%, with extensive DCIS -Diagnosed in October 2024 -I reviewed his surgical pathology findings, which showed DCIS, 8 mm invasive ductal carcinoma, repeated prognostic panel showed ER 20% weak positive, PR and HER2 negative with high Ki-67 60%, grade 3. -Due to the aggressive nature of her functional triple negative disease, I recommend her to consider adjuvant chemotherapy -Due to the positive margin, she proceeded with re-excision surgery on 11/13/2022 which showed additional 1cm invasive cancer, final margins were negative  -I recommend adjuvant chemo TC every 3 weeks for 4 cycles with G-CSF on day 3, she started on 12/11/2023.  Due to drug-drug interaction with her HIV medication, I reduced docetaxel  dose by 50%. She completed on 02/12/2024, overall tolerated well

## 2024-03-18 NOTE — Patient Instructions (Signed)
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
## 2024-03-19 ENCOUNTER — Encounter: Payer: Self-pay | Admitting: *Deleted

## 2024-03-19 ENCOUNTER — Other Ambulatory Visit: Payer: Self-pay

## 2024-03-19 ENCOUNTER — Telehealth: Payer: Self-pay

## 2024-03-19 ENCOUNTER — Other Ambulatory Visit: Payer: Self-pay | Admitting: Hematology

## 2024-03-19 DIAGNOSIS — C50412 Malignant neoplasm of upper-outer quadrant of left female breast: Secondary | ICD-10-CM

## 2024-03-19 NOTE — Telephone Encounter (Signed)
 Spoke with pt regarding US  of lft breast appt with [facility] on 03/27/2024 @1415 .  Pt confirmed appt and stated she will be at the appt.  Confirmed address with pt for [facility].  Pt had no further questions or concerns at this time.  Instructed pt to continue to take the Abx as prescribed by Dr. Maryalice Smaller.  Pt verbalized understanding.

## 2024-03-27 ENCOUNTER — Ambulatory Visit
Admission: RE | Admit: 2024-03-27 | Discharge: 2024-03-27 | Disposition: A | Discharge status: Home / Self Care | Source: Ambulatory Visit | Attending: Hematology | Admitting: Hematology

## 2024-03-27 ENCOUNTER — Other Ambulatory Visit: Payer: Self-pay | Admitting: Student

## 2024-03-27 DIAGNOSIS — Z853 Personal history of malignant neoplasm of breast: Secondary | ICD-10-CM | POA: Diagnosis not present

## 2024-03-27 DIAGNOSIS — C50412 Malignant neoplasm of upper-outer quadrant of left female breast: Secondary | ICD-10-CM

## 2024-03-27 DIAGNOSIS — E119 Type 2 diabetes mellitus without complications: Secondary | ICD-10-CM

## 2024-03-30 ENCOUNTER — Encounter: Payer: Self-pay | Admitting: *Deleted

## 2024-03-31 ENCOUNTER — Other Ambulatory Visit: Payer: Self-pay | Admitting: Student

## 2024-03-31 DIAGNOSIS — F419 Anxiety disorder, unspecified: Secondary | ICD-10-CM

## 2024-04-07 ENCOUNTER — Ambulatory Visit: Payer: Self-pay

## 2024-04-07 ENCOUNTER — Telehealth: Payer: Self-pay | Admitting: *Deleted
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
## 2024-04-07 NOTE — Telephone Encounter (Signed)
 Copied from CRM [id number]. Topic: General - Other >> Apr 03, 2024  1:35 PM Blair Bumpers wrote: Reason for CRM: Steele Edelson, with [facility] [employer], called in stating that patient is requesting her doctor to write a prescription for Tucks bed pads.

## 2024-04-07 NOTE — Patient Instructions (Signed)
 Visit Information  Thank you for taking time to visit with me today. Please don't hesitate to contact me if I can be of assistance to you before our next scheduled appointment.  Your next care management appointment is:  05/08/24  1015 am   Please call the care guide team at [phone] if you need to cancel, schedule, or reschedule an appointment.   Please call [phone]-TALK (toll free, 24 hour hotline) if you are experiencing a Mental Health or Behavioral Health Crisis or need someone to talk to.  Augustin Leber RN, BSN, [facility]  [facility], [facility]  Care Coordinator Phone: [phone]

## 2024-04-08 ENCOUNTER — Ambulatory Visit
Admission: RE | Admit: 2024-04-08 | Discharge: 2024-04-08 | Disposition: A | Discharge status: Home / Self Care | Source: Ambulatory Visit | Attending: Radiation Oncology | Admitting: Radiation Oncology

## 2024-04-08 DIAGNOSIS — C50212 Malignant neoplasm of upper-inner quadrant of left female breast: Secondary | ICD-10-CM | POA: Diagnosis not present

## 2024-04-08 DIAGNOSIS — C50412 Malignant neoplasm of upper-outer quadrant of left female breast: Secondary | ICD-10-CM | POA: Diagnosis not present

## 2024-04-08 DIAGNOSIS — Z170 Estrogen receptor positive status [ER+]: Secondary | ICD-10-CM | POA: Insufficient documentation

## 2024-04-09 NOTE — Progress Notes (Signed)
 The patient's left breast incision site has healed without fullness, erythema, or drainage from her incision site. She's been treated with several courses of antibiotics, but appears to be ready at this point to proceed with radiotherapy. She is encouraged to contact us  prior to radiation beginning if she starts to see any further signs of infection.

## 2024-04-13 ENCOUNTER — Encounter: Payer: Self-pay | Admitting: *Deleted

## 2024-04-13 DIAGNOSIS — C50412 Malignant neoplasm of upper-outer quadrant of left female breast: Secondary | ICD-10-CM

## 2024-04-14 ENCOUNTER — Ambulatory Visit
Admission: RE | Admit: 2024-04-14 | Discharge: 2024-04-14 | Disposition: A | Discharge status: Home / Self Care | Source: Ambulatory Visit | Attending: Radiation Oncology | Admitting: Radiation Oncology

## 2024-04-14 DIAGNOSIS — C50412 Malignant neoplasm of upper-outer quadrant of left female breast: Secondary | ICD-10-CM | POA: Insufficient documentation

## 2024-04-14 DIAGNOSIS — Z170 Estrogen receptor positive status [ER+]: Secondary | ICD-10-CM | POA: Diagnosis not present

## 2024-04-14 DIAGNOSIS — C50212 Malignant neoplasm of upper-inner quadrant of left female breast: Secondary | ICD-10-CM | POA: Diagnosis not present

## 2024-04-15 ENCOUNTER — Other Ambulatory Visit: Payer: Self-pay

## 2024-04-15 DIAGNOSIS — Z510 Encounter for antineoplastic radiation therapy: Secondary | ICD-10-CM | POA: Diagnosis not present

## 2024-04-15 DIAGNOSIS — C50412 Malignant neoplasm of upper-outer quadrant of left female breast: Secondary | ICD-10-CM | POA: Diagnosis not present

## 2024-04-15 DIAGNOSIS — C50212 Malignant neoplasm of upper-inner quadrant of left female breast: Secondary | ICD-10-CM | POA: Diagnosis not present

## 2024-04-15 DIAGNOSIS — Z170 Estrogen receptor positive status [ER+]: Secondary | ICD-10-CM | POA: Diagnosis not present

## 2024-04-15 LAB — RAD ONC ARIA SESSION SUMMARY
Course Elapsed Days: 0
Plan Fractions Treated to Date: 1
Plan Prescribed Dose Per Fraction: 2.66 Gy
Plan Total Fractions Prescribed: 16
Plan Total Prescribed Dose: 42.56 Gy
Reference Point Dosage Given to Date: 2.66 Gy
Reference Point Session Dosage Given: 2.66 Gy
Session Number: 1

## 2024-04-16 ENCOUNTER — Other Ambulatory Visit: Payer: Self-pay

## 2024-04-16 ENCOUNTER — Ambulatory Visit
Admission: RE | Admit: 2024-04-16 | Discharge: 2024-04-16 | Disposition: A | Discharge status: Home / Self Care | Source: Ambulatory Visit | Attending: Radiation Oncology

## 2024-04-16 ENCOUNTER — Ambulatory Visit: Admitting: Radiation Oncology

## 2024-04-16 DIAGNOSIS — C50412 Malignant neoplasm of upper-outer quadrant of left female breast: Secondary | ICD-10-CM | POA: Diagnosis not present

## 2024-04-16 DIAGNOSIS — Z510 Encounter for antineoplastic radiation therapy: Secondary | ICD-10-CM | POA: Diagnosis not present

## 2024-04-16 DIAGNOSIS — C50212 Malignant neoplasm of upper-inner quadrant of left female breast: Secondary | ICD-10-CM | POA: Diagnosis not present

## 2024-04-16 DIAGNOSIS — Z170 Estrogen receptor positive status [ER+]: Secondary | ICD-10-CM | POA: Diagnosis not present

## 2024-04-16 LAB — RAD ONC ARIA SESSION SUMMARY
Course Elapsed Days: 1
Plan Fractions Treated to Date: 2
Plan Prescribed Dose Per Fraction: 2.66 Gy
Plan Total Fractions Prescribed: 16
Plan Total Prescribed Dose: 42.56 Gy
Reference Point Dosage Given to Date: 5.32 Gy
Reference Point Session Dosage Given: 2.66 Gy
Session Number: 2

## 2024-04-17 ENCOUNTER — Ambulatory Visit
Admission: RE | Admit: 2024-04-17 | Discharge: 2024-04-17 | Disposition: A | Discharge status: Home / Self Care | Source: Ambulatory Visit | Attending: Radiation Oncology | Admitting: Radiation Oncology

## 2024-04-17 ENCOUNTER — Other Ambulatory Visit: Payer: Self-pay

## 2024-04-17 DIAGNOSIS — C50212 Malignant neoplasm of upper-inner quadrant of left female breast: Secondary | ICD-10-CM | POA: Diagnosis not present

## 2024-04-17 DIAGNOSIS — Z170 Estrogen receptor positive status [ER+]: Secondary | ICD-10-CM

## 2024-04-17 DIAGNOSIS — Z510 Encounter for antineoplastic radiation therapy: Secondary | ICD-10-CM | POA: Diagnosis not present

## 2024-04-17 DIAGNOSIS — C50412 Malignant neoplasm of upper-outer quadrant of left female breast: Secondary | ICD-10-CM | POA: Diagnosis not present

## 2024-04-17 DIAGNOSIS — K219 Gastro-esophageal reflux disease without esophagitis: Secondary | ICD-10-CM

## 2024-04-17 LAB — RAD ONC ARIA SESSION SUMMARY
Course Elapsed Days: 2
Plan Fractions Treated to Date: 3
Plan Prescribed Dose Per Fraction: 2.66 Gy
Plan Total Fractions Prescribed: 16
Plan Total Prescribed Dose: 42.56 Gy
Reference Point Dosage Given to Date: 7.98 Gy
Reference Point Session Dosage Given: 2.66 Gy
Session Number: 3

## 2024-04-17 MED ORDER — RADIAPLEXRX EX GEL: Freq: Once | CUTANEOUS | Status: AC

## 2024-04-17 MED ORDER — ALRA NON-METALLIC DEODORANT (RAD-ONC)
1.0000 | Freq: Once | TOPICAL | Status: AC
  Administered 2024-04-17: 1 via TOPICAL

## 2024-04-20 ENCOUNTER — Ambulatory Visit
Admission: RE | Admit: 2024-04-20 | Discharge: 2024-04-20 | Disposition: A | Discharge status: Home / Self Care | Source: Ambulatory Visit | Attending: Radiation Oncology | Admitting: Radiation Oncology

## 2024-04-20 ENCOUNTER — Other Ambulatory Visit: Payer: Self-pay

## 2024-04-20 DIAGNOSIS — C50412 Malignant neoplasm of upper-outer quadrant of left female breast: Secondary | ICD-10-CM | POA: Diagnosis not present

## 2024-04-20 DIAGNOSIS — Z170 Estrogen receptor positive status [ER+]: Secondary | ICD-10-CM | POA: Diagnosis not present

## 2024-04-20 LAB — RAD ONC ARIA SESSION SUMMARY
Course Elapsed Days: 5
Plan Fractions Treated to Date: 4
Plan Prescribed Dose Per Fraction: 2.66 Gy
Plan Total Fractions Prescribed: 16
Plan Total Prescribed Dose: 42.56 Gy
Reference Point Dosage Given to Date: 10.64 Gy
Reference Point Session Dosage Given: 2.66 Gy
Session Number: 4

## 2024-04-20 MED ORDER — OMEPRAZOLE 20 MG PO CPDR: 20.0000 mg | DELAYED_RELEASE_CAPSULE | Freq: Every day | ORAL | 1 refills | Status: DC

## 2024-04-21 ENCOUNTER — Ambulatory Visit
Admission: RE | Admit: 2024-04-21 | Discharge: 2024-04-21 | Disposition: A | Discharge status: Home / Self Care | Source: Ambulatory Visit | Attending: Radiation Oncology

## 2024-04-21 ENCOUNTER — Other Ambulatory Visit: Payer: Self-pay

## 2024-04-21 DIAGNOSIS — Z510 Encounter for antineoplastic radiation therapy: Secondary | ICD-10-CM | POA: Diagnosis not present

## 2024-04-21 DIAGNOSIS — C50412 Malignant neoplasm of upper-outer quadrant of left female breast: Secondary | ICD-10-CM | POA: Diagnosis not present

## 2024-04-21 DIAGNOSIS — C50212 Malignant neoplasm of upper-inner quadrant of left female breast: Secondary | ICD-10-CM | POA: Diagnosis not present

## 2024-04-21 DIAGNOSIS — Z170 Estrogen receptor positive status [ER+]: Secondary | ICD-10-CM | POA: Diagnosis not present

## 2024-04-21 LAB — RAD ONC ARIA SESSION SUMMARY
Course Elapsed Days: 6
Plan Fractions Treated to Date: 5
Plan Prescribed Dose Per Fraction: 2.66 Gy
Plan Total Fractions Prescribed: 16
Plan Total Prescribed Dose: 42.56 Gy
Reference Point Dosage Given to Date: 13.3 Gy
Reference Point Session Dosage Given: 2.66 Gy
Session Number: 5

## 2024-04-22 ENCOUNTER — Ambulatory Visit
Admission: RE | Admit: 2024-04-22 | Discharge: 2024-04-22 | Discharge status: Home / Self Care | Source: Ambulatory Visit | Attending: Radiation Oncology

## 2024-04-22 ENCOUNTER — Other Ambulatory Visit: Payer: Self-pay

## 2024-04-22 DIAGNOSIS — C50412 Malignant neoplasm of upper-outer quadrant of left female breast: Secondary | ICD-10-CM | POA: Diagnosis not present

## 2024-04-22 DIAGNOSIS — C50212 Malignant neoplasm of upper-inner quadrant of left female breast: Secondary | ICD-10-CM | POA: Diagnosis not present

## 2024-04-22 DIAGNOSIS — Z170 Estrogen receptor positive status [ER+]: Secondary | ICD-10-CM | POA: Diagnosis not present

## 2024-04-22 DIAGNOSIS — Z510 Encounter for antineoplastic radiation therapy: Secondary | ICD-10-CM | POA: Diagnosis not present

## 2024-04-22 LAB — RAD ONC ARIA SESSION SUMMARY
Course Elapsed Days: 7
Plan Fractions Treated to Date: 6
Plan Prescribed Dose Per Fraction: 2.66 Gy
Plan Total Fractions Prescribed: 16
Plan Total Prescribed Dose: 42.56 Gy
Reference Point Dosage Given to Date: 15.96 Gy
Reference Point Session Dosage Given: 2.66 Gy
Session Number: 6

## 2024-04-23 ENCOUNTER — Ambulatory Visit
Admission: RE | Admit: 2024-04-23 | Discharge: 2024-04-23 | Disposition: A | Discharge status: Home / Self Care | Source: Ambulatory Visit | Attending: Radiation Oncology | Admitting: Radiation Oncology

## 2024-04-23 ENCOUNTER — Other Ambulatory Visit: Payer: Self-pay

## 2024-04-23 DIAGNOSIS — C50412 Malignant neoplasm of upper-outer quadrant of left female breast: Secondary | ICD-10-CM | POA: Diagnosis not present

## 2024-04-23 DIAGNOSIS — Z170 Estrogen receptor positive status [ER+]: Secondary | ICD-10-CM | POA: Diagnosis not present

## 2024-04-23 DIAGNOSIS — C50212 Malignant neoplasm of upper-inner quadrant of left female breast: Secondary | ICD-10-CM | POA: Diagnosis not present

## 2024-04-23 DIAGNOSIS — Z510 Encounter for antineoplastic radiation therapy: Secondary | ICD-10-CM | POA: Diagnosis not present

## 2024-04-23 LAB — RAD ONC ARIA SESSION SUMMARY
Course Elapsed Days: 8
Plan Fractions Treated to Date: 7
Plan Prescribed Dose Per Fraction: 2.66 Gy
Plan Total Fractions Prescribed: 16
Plan Total Prescribed Dose: 42.56 Gy
Reference Point Dosage Given to Date: 18.62 Gy
Reference Point Session Dosage Given: 2.66 Gy
Session Number: 7

## 2024-04-24 ENCOUNTER — Other Ambulatory Visit: Payer: Self-pay

## 2024-04-24 ENCOUNTER — Ambulatory Visit
Admission: RE | Admit: 2024-04-24 | Discharge: 2024-04-24 | Disposition: A | Discharge status: Home / Self Care | Source: Ambulatory Visit | Attending: Radiation Oncology

## 2024-04-24 DIAGNOSIS — Z510 Encounter for antineoplastic radiation therapy: Secondary | ICD-10-CM | POA: Diagnosis not present

## 2024-04-24 DIAGNOSIS — C50412 Malignant neoplasm of upper-outer quadrant of left female breast: Secondary | ICD-10-CM | POA: Diagnosis not present

## 2024-04-24 DIAGNOSIS — Z170 Estrogen receptor positive status [ER+]: Secondary | ICD-10-CM | POA: Diagnosis not present

## 2024-04-24 DIAGNOSIS — C50212 Malignant neoplasm of upper-inner quadrant of left female breast: Secondary | ICD-10-CM | POA: Diagnosis not present

## 2024-04-24 LAB — RAD ONC ARIA SESSION SUMMARY
Course Elapsed Days: 9
Plan Fractions Treated to Date: 8
Plan Prescribed Dose Per Fraction: 2.66 Gy
Plan Total Fractions Prescribed: 16
Plan Total Prescribed Dose: 42.56 Gy
Reference Point Dosage Given to Date: 21.28 Gy
Reference Point Session Dosage Given: 2.66 Gy
Session Number: 8

## 2024-04-27 ENCOUNTER — Ambulatory Visit
Admission: RE | Admit: 2024-04-27 | Discharge: 2024-04-27 | Disposition: A | Discharge status: Home / Self Care | Source: Ambulatory Visit | Attending: Radiation Oncology

## 2024-04-27 ENCOUNTER — Other Ambulatory Visit: Payer: Self-pay

## 2024-04-27 DIAGNOSIS — Z510 Encounter for antineoplastic radiation therapy: Secondary | ICD-10-CM | POA: Diagnosis not present

## 2024-04-27 DIAGNOSIS — C50412 Malignant neoplasm of upper-outer quadrant of left female breast: Secondary | ICD-10-CM | POA: Diagnosis not present

## 2024-04-27 DIAGNOSIS — C50212 Malignant neoplasm of upper-inner quadrant of left female breast: Secondary | ICD-10-CM | POA: Diagnosis not present

## 2024-04-27 DIAGNOSIS — Z170 Estrogen receptor positive status [ER+]: Secondary | ICD-10-CM | POA: Diagnosis not present

## 2024-04-27 LAB — RAD ONC ARIA SESSION SUMMARY
Course Elapsed Days: 12
Plan Fractions Treated to Date: 9
Plan Prescribed Dose Per Fraction: 2.66 Gy
Plan Total Fractions Prescribed: 16
Plan Total Prescribed Dose: 42.56 Gy
Reference Point Dosage Given to Date: 23.94 Gy
Reference Point Session Dosage Given: 2.66 Gy
Session Number: 9

## 2024-04-28 ENCOUNTER — Ambulatory Visit
Admission: RE | Admit: 2024-04-28 | Discharge: 2024-04-28 | Disposition: A | Discharge status: Home / Self Care | Source: Ambulatory Visit | Attending: Radiation Oncology | Admitting: Radiation Oncology

## 2024-04-28 ENCOUNTER — Other Ambulatory Visit: Payer: Self-pay

## 2024-04-28 DIAGNOSIS — C50212 Malignant neoplasm of upper-inner quadrant of left female breast: Secondary | ICD-10-CM | POA: Diagnosis not present

## 2024-04-28 DIAGNOSIS — C50412 Malignant neoplasm of upper-outer quadrant of left female breast: Secondary | ICD-10-CM | POA: Diagnosis not present

## 2024-04-28 DIAGNOSIS — Z510 Encounter for antineoplastic radiation therapy: Secondary | ICD-10-CM | POA: Diagnosis not present

## 2024-04-28 DIAGNOSIS — Z170 Estrogen receptor positive status [ER+]: Secondary | ICD-10-CM | POA: Diagnosis not present

## 2024-04-28 LAB — RAD ONC ARIA SESSION SUMMARY
Course Elapsed Days: 13
Plan Fractions Treated to Date: 10
Plan Prescribed Dose Per Fraction: 2.66 Gy
Plan Total Fractions Prescribed: 16
Plan Total Prescribed Dose: 42.56 Gy
Reference Point Dosage Given to Date: 26.6 Gy
Reference Point Session Dosage Given: 2.66 Gy
Session Number: 10

## 2024-04-29 ENCOUNTER — Other Ambulatory Visit: Payer: Self-pay

## 2024-04-29 ENCOUNTER — Ambulatory Visit
Admission: RE | Admit: 2024-04-29 | Discharge: 2024-04-29 | Disposition: A | Discharge status: Home / Self Care | Source: Ambulatory Visit | Attending: Radiation Oncology | Admitting: Radiation Oncology

## 2024-04-29 DIAGNOSIS — C50212 Malignant neoplasm of upper-inner quadrant of left female breast: Secondary | ICD-10-CM | POA: Diagnosis not present

## 2024-04-29 DIAGNOSIS — C50412 Malignant neoplasm of upper-outer quadrant of left female breast: Secondary | ICD-10-CM | POA: Diagnosis not present

## 2024-04-29 DIAGNOSIS — Z170 Estrogen receptor positive status [ER+]: Secondary | ICD-10-CM | POA: Diagnosis not present

## 2024-04-29 DIAGNOSIS — Z510 Encounter for antineoplastic radiation therapy: Secondary | ICD-10-CM | POA: Diagnosis not present

## 2024-04-29 LAB — RAD ONC ARIA SESSION SUMMARY
Course Elapsed Days: 14
Plan Fractions Treated to Date: 11
Plan Prescribed Dose Per Fraction: 2.66 Gy
Plan Total Fractions Prescribed: 16
Plan Total Prescribed Dose: 42.56 Gy
Reference Point Dosage Given to Date: 29.26 Gy
Reference Point Session Dosage Given: 2.66 Gy
Session Number: 11

## 2024-04-30 ENCOUNTER — Ambulatory Visit
Admission: RE | Admit: 2024-04-30 | Discharge: 2024-04-30 | Disposition: A | Discharge status: Home / Self Care | Source: Ambulatory Visit | Attending: Radiation Oncology | Admitting: Radiation Oncology

## 2024-04-30 ENCOUNTER — Other Ambulatory Visit: Payer: Self-pay

## 2024-04-30 DIAGNOSIS — Z510 Encounter for antineoplastic radiation therapy: Secondary | ICD-10-CM | POA: Diagnosis not present

## 2024-04-30 DIAGNOSIS — Z170 Estrogen receptor positive status [ER+]: Secondary | ICD-10-CM | POA: Diagnosis not present

## 2024-04-30 DIAGNOSIS — C50212 Malignant neoplasm of upper-inner quadrant of left female breast: Secondary | ICD-10-CM | POA: Diagnosis not present

## 2024-04-30 DIAGNOSIS — C50412 Malignant neoplasm of upper-outer quadrant of left female breast: Secondary | ICD-10-CM | POA: Diagnosis not present

## 2024-04-30 LAB — RAD ONC ARIA SESSION SUMMARY
Course Elapsed Days: 15
Plan Fractions Treated to Date: 12
Plan Prescribed Dose Per Fraction: 2.66 Gy
Plan Total Fractions Prescribed: 16
Plan Total Prescribed Dose: 42.56 Gy
Reference Point Dosage Given to Date: 31.92 Gy
Reference Point Session Dosage Given: 2.66 Gy
Session Number: 12

## 2024-05-01 ENCOUNTER — Ambulatory Visit
Admission: RE | Admit: 2024-05-01 | Discharge: 2024-05-01 | Disposition: A | Discharge status: Home / Self Care | Source: Ambulatory Visit | Attending: Radiation Oncology | Admitting: Radiation Oncology

## 2024-05-01 ENCOUNTER — Other Ambulatory Visit: Payer: Self-pay

## 2024-05-01 ENCOUNTER — Ambulatory Visit: Admitting: Radiation Oncology

## 2024-05-01 DIAGNOSIS — C50412 Malignant neoplasm of upper-outer quadrant of left female breast: Secondary | ICD-10-CM | POA: Diagnosis not present

## 2024-05-01 DIAGNOSIS — C50212 Malignant neoplasm of upper-inner quadrant of left female breast: Secondary | ICD-10-CM | POA: Diagnosis not present

## 2024-05-01 DIAGNOSIS — Z510 Encounter for antineoplastic radiation therapy: Secondary | ICD-10-CM | POA: Diagnosis not present

## 2024-05-01 DIAGNOSIS — Z170 Estrogen receptor positive status [ER+]: Secondary | ICD-10-CM | POA: Diagnosis not present

## 2024-05-01 LAB — RAD ONC ARIA SESSION SUMMARY
Course Elapsed Days: 16
Plan Fractions Treated to Date: 13
Plan Prescribed Dose Per Fraction: 2.66 Gy
Plan Total Fractions Prescribed: 16
Plan Total Prescribed Dose: 42.56 Gy
Reference Point Dosage Given to Date: 34.58 Gy
Reference Point Session Dosage Given: 2.66 Gy
Session Number: 13

## 2024-05-04 ENCOUNTER — Other Ambulatory Visit: Payer: Self-pay

## 2024-05-04 ENCOUNTER — Ambulatory Visit
Admission: RE | Admit: 2024-05-04 | Discharge: 2024-05-04 | Disposition: A | Discharge status: Home / Self Care | Source: Ambulatory Visit | Attending: Radiation Oncology | Admitting: Radiation Oncology

## 2024-05-04 DIAGNOSIS — C50412 Malignant neoplasm of upper-outer quadrant of left female breast: Secondary | ICD-10-CM | POA: Diagnosis not present

## 2024-05-04 DIAGNOSIS — Z170 Estrogen receptor positive status [ER+]: Secondary | ICD-10-CM | POA: Diagnosis not present

## 2024-05-04 DIAGNOSIS — C50212 Malignant neoplasm of upper-inner quadrant of left female breast: Secondary | ICD-10-CM | POA: Diagnosis not present

## 2024-05-04 DIAGNOSIS — Z510 Encounter for antineoplastic radiation therapy: Secondary | ICD-10-CM | POA: Diagnosis not present

## 2024-05-04 LAB — RAD ONC ARIA SESSION SUMMARY
Course Elapsed Days: 19
Plan Fractions Treated to Date: 14
Plan Prescribed Dose Per Fraction: 2.66 Gy
Plan Total Fractions Prescribed: 16
Plan Total Prescribed Dose: 42.56 Gy
Reference Point Dosage Given to Date: 37.24 Gy
Reference Point Session Dosage Given: 2.66 Gy
Session Number: 14

## 2024-05-05 ENCOUNTER — Other Ambulatory Visit: Payer: Self-pay

## 2024-05-05 ENCOUNTER — Ambulatory Visit (HOSPITAL_BASED_OUTPATIENT_CLINIC_OR_DEPARTMENT_OTHER): Admitting: General Surgery

## 2024-05-05 ENCOUNTER — Ambulatory Visit
Admission: RE | Admit: 2024-05-05 | Discharge: 2024-05-05 | Disposition: A | Discharge status: Home / Self Care | Source: Ambulatory Visit | Attending: Radiation Oncology | Admitting: Radiation Oncology

## 2024-05-05 DIAGNOSIS — C50212 Malignant neoplasm of upper-inner quadrant of left female breast: Secondary | ICD-10-CM | POA: Diagnosis not present

## 2024-05-05 DIAGNOSIS — C50412 Malignant neoplasm of upper-outer quadrant of left female breast: Secondary | ICD-10-CM | POA: Diagnosis not present

## 2024-05-05 DIAGNOSIS — Z170 Estrogen receptor positive status [ER+]: Secondary | ICD-10-CM | POA: Diagnosis not present

## 2024-05-05 DIAGNOSIS — Z510 Encounter for antineoplastic radiation therapy: Secondary | ICD-10-CM | POA: Diagnosis not present

## 2024-05-05 LAB — RAD ONC ARIA SESSION SUMMARY
Course Elapsed Days: 20
Plan Fractions Treated to Date: 15
Plan Prescribed Dose Per Fraction: 2.66 Gy
Plan Total Fractions Prescribed: 16
Plan Total Prescribed Dose: 42.56 Gy
Reference Point Dosage Given to Date: 39.9 Gy
Reference Point Session Dosage Given: 2.66 Gy
Session Number: 15

## 2024-05-06 ENCOUNTER — Other Ambulatory Visit: Payer: Self-pay

## 2024-05-06 ENCOUNTER — Ambulatory Visit
Admission: RE | Admit: 2024-05-06 | Discharge: 2024-05-06 | Disposition: A | Discharge status: Home / Self Care | Source: Ambulatory Visit | Attending: Radiation Oncology

## 2024-05-06 DIAGNOSIS — C50412 Malignant neoplasm of upper-outer quadrant of left female breast: Secondary | ICD-10-CM | POA: Diagnosis not present

## 2024-05-06 DIAGNOSIS — C50212 Malignant neoplasm of upper-inner quadrant of left female breast: Secondary | ICD-10-CM | POA: Diagnosis not present

## 2024-05-06 DIAGNOSIS — Z170 Estrogen receptor positive status [ER+]: Secondary | ICD-10-CM | POA: Diagnosis not present

## 2024-05-06 DIAGNOSIS — Z510 Encounter for antineoplastic radiation therapy: Secondary | ICD-10-CM | POA: Diagnosis not present

## 2024-05-06 LAB — RAD ONC ARIA SESSION SUMMARY
Course Elapsed Days: 21
Plan Fractions Treated to Date: 16
Plan Prescribed Dose Per Fraction: 2.66 Gy
Plan Total Fractions Prescribed: 16
Plan Total Prescribed Dose: 42.56 Gy
Reference Point Dosage Given to Date: 42.56 Gy
Reference Point Session Dosage Given: 2.66 Gy
Session Number: 16

## 2024-05-07 ENCOUNTER — Other Ambulatory Visit: Payer: Self-pay

## 2024-05-07 ENCOUNTER — Ambulatory Visit
Admission: RE | Admit: 2024-05-07 | Discharge: 2024-05-07 | Disposition: A | Discharge status: Home / Self Care | Source: Ambulatory Visit | Attending: Radiation Oncology | Admitting: Radiation Oncology

## 2024-05-07 DIAGNOSIS — C50412 Malignant neoplasm of upper-outer quadrant of left female breast: Secondary | ICD-10-CM | POA: Diagnosis not present

## 2024-05-07 DIAGNOSIS — Z170 Estrogen receptor positive status [ER+]: Secondary | ICD-10-CM | POA: Diagnosis not present

## 2024-05-07 DIAGNOSIS — Z510 Encounter for antineoplastic radiation therapy: Secondary | ICD-10-CM | POA: Diagnosis not present

## 2024-05-07 DIAGNOSIS — C50212 Malignant neoplasm of upper-inner quadrant of left female breast: Secondary | ICD-10-CM | POA: Diagnosis not present

## 2024-05-07 LAB — RAD ONC ARIA SESSION SUMMARY
Course Elapsed Days: 22
Plan Fractions Treated to Date: 1
Plan Prescribed Dose Per Fraction: 2 Gy
Plan Total Fractions Prescribed: 4
Plan Total Prescribed Dose: 8 Gy
Reference Point Dosage Given to Date: 2 Gy
Reference Point Session Dosage Given: 2 Gy
Session Number: 17

## 2024-05-08 ENCOUNTER — Ambulatory Visit

## 2024-05-08 ENCOUNTER — Other Ambulatory Visit: Payer: Self-pay
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
## 2024-05-08 NOTE — Patient Instructions (Signed)
 Visit Information  Thank you for taking time to visit with me today. Please don't hesitate to contact me if I can be of assistance to you before our next scheduled appointment.  Your next care management appointment is by telephone on 06/11/24  am at 930 am   Please call the care guide team at [phone] if you need to cancel, schedule, or reschedule an appointment.   Please call 1-800-273-TALK (toll free, 24 hour hotline) go to [facility] [address], [place] [phone]) call 911 if you are experiencing a Mental Health or Behavioral Health Crisis or need someone to talk to.  Wilbert Diver RN, BSN, [facility]  [facility], [facility]    Care Coordinator Phone: [phone]

## 2024-05-11 ENCOUNTER — Ambulatory Visit
Admission: RE | Admit: 2024-05-11 | Discharge: 2024-05-11 | Disposition: A | Discharge status: Home / Self Care | Source: Ambulatory Visit | Attending: Radiation Oncology | Admitting: Radiation Oncology

## 2024-05-11 ENCOUNTER — Other Ambulatory Visit: Payer: Self-pay

## 2024-05-11 DIAGNOSIS — C50412 Malignant neoplasm of upper-outer quadrant of left female breast: Secondary | ICD-10-CM | POA: Diagnosis not present

## 2024-05-11 DIAGNOSIS — Z170 Estrogen receptor positive status [ER+]: Secondary | ICD-10-CM | POA: Diagnosis not present

## 2024-05-11 LAB — RAD ONC ARIA SESSION SUMMARY
Course Elapsed Days: 26
Plan Fractions Treated to Date: 2
Plan Prescribed Dose Per Fraction: 2 Gy
Plan Total Fractions Prescribed: 4
Plan Total Prescribed Dose: 8 Gy
Reference Point Dosage Given to Date: 4 Gy
Reference Point Session Dosage Given: 2 Gy
Session Number: 18

## 2024-05-12 ENCOUNTER — Ambulatory Visit

## 2024-05-12 ENCOUNTER — Inpatient Hospital Stay: Attending: Hematology | Admitting: Hematology

## 2024-05-12 ENCOUNTER — Ambulatory Visit
Admission: RE | Admit: 2024-05-12 | Discharge: 2024-05-12 | Disposition: A | Discharge status: Home / Self Care | Source: Ambulatory Visit | Attending: Radiation Oncology | Admitting: Radiation Oncology

## 2024-05-12 ENCOUNTER — Other Ambulatory Visit: Payer: Self-pay

## 2024-05-12 VITALS — BP 124/64 | HR 69 | Temp 97.8°F | Resp 15 | Ht 67.0 in | Wt 212.9 lb

## 2024-05-12 DIAGNOSIS — Z170 Estrogen receptor positive status [ER+]: Secondary | ICD-10-CM | POA: Insufficient documentation

## 2024-05-12 DIAGNOSIS — Z881 Allergy status to other antibiotic agents status: Secondary | ICD-10-CM | POA: Insufficient documentation

## 2024-05-12 DIAGNOSIS — Z1732 Human epidermal growth factor receptor 2 negative status: Secondary | ICD-10-CM | POA: Insufficient documentation

## 2024-05-12 DIAGNOSIS — Z1722 Progesterone receptor negative status: Secondary | ICD-10-CM | POA: Insufficient documentation

## 2024-05-12 DIAGNOSIS — Z8673 Personal history of transient ischemic attack (TIA), and cerebral infarction without residual deficits: Secondary | ICD-10-CM | POA: Diagnosis not present

## 2024-05-12 DIAGNOSIS — Z7982 Long term (current) use of aspirin: Secondary | ICD-10-CM | POA: Insufficient documentation

## 2024-05-12 DIAGNOSIS — N6489 Other specified disorders of breast: Secondary | ICD-10-CM | POA: Insufficient documentation

## 2024-05-12 DIAGNOSIS — Z79899 Other long term (current) drug therapy: Secondary | ICD-10-CM | POA: Diagnosis not present

## 2024-05-12 DIAGNOSIS — Z90710 Acquired absence of both cervix and uterus: Secondary | ICD-10-CM | POA: Insufficient documentation

## 2024-05-12 DIAGNOSIS — Z21 Asymptomatic human immunodeficiency virus [HIV] infection status: Secondary | ICD-10-CM | POA: Diagnosis not present

## 2024-05-12 DIAGNOSIS — M1990 Unspecified osteoarthritis, unspecified site: Secondary | ICD-10-CM | POA: Insufficient documentation

## 2024-05-12 DIAGNOSIS — F419 Anxiety disorder, unspecified: Secondary | ICD-10-CM | POA: Diagnosis not present

## 2024-05-12 DIAGNOSIS — E119 Type 2 diabetes mellitus without complications: Secondary | ICD-10-CM | POA: Diagnosis not present

## 2024-05-12 DIAGNOSIS — I10 Essential (primary) hypertension: Secondary | ICD-10-CM | POA: Diagnosis not present

## 2024-05-12 DIAGNOSIS — Z8619 Personal history of other infectious and parasitic diseases: Secondary | ICD-10-CM | POA: Insufficient documentation

## 2024-05-12 DIAGNOSIS — M1611 Unilateral primary osteoarthritis, right hip: Secondary | ICD-10-CM | POA: Diagnosis not present

## 2024-05-12 DIAGNOSIS — Z923 Personal history of irradiation: Secondary | ICD-10-CM | POA: Insufficient documentation

## 2024-05-12 DIAGNOSIS — E2839 Other primary ovarian failure: Secondary | ICD-10-CM | POA: Diagnosis not present

## 2024-05-12 DIAGNOSIS — Z79811 Long term (current) use of aromatase inhibitors: Secondary | ICD-10-CM | POA: Insufficient documentation

## 2024-05-12 DIAGNOSIS — B182 Chronic viral hepatitis C: Secondary | ICD-10-CM | POA: Insufficient documentation

## 2024-05-12 DIAGNOSIS — C50412 Malignant neoplasm of upper-outer quadrant of left female breast: Secondary | ICD-10-CM | POA: Insufficient documentation

## 2024-05-12 DIAGNOSIS — M79606 Pain in leg, unspecified: Secondary | ICD-10-CM | POA: Insufficient documentation

## 2024-05-12 DIAGNOSIS — M2550 Pain in unspecified joint: Secondary | ICD-10-CM | POA: Diagnosis not present

## 2024-05-12 LAB — RAD ONC ARIA SESSION SUMMARY
Course Elapsed Days: 27
Plan Fractions Treated to Date: 3
Plan Prescribed Dose Per Fraction: 2 Gy
Plan Total Fractions Prescribed: 4
Plan Total Prescribed Dose: 8 Gy
Reference Point Dosage Given to Date: 6 Gy
Reference Point Session Dosage Given: 2 Gy
Session Number: 19

## 2024-05-12 MED ORDER — ANASTROZOLE 1 MG PO TABS: 1.0000 mg | ORAL_TABLET | Freq: Every day | ORAL | 3 refills | Status: DC

## 2024-05-12 NOTE — Assessment & Plan Note (Signed)
-  [id number], stag I, G3, ER 20% WEAK, PR-, HER- (0), ki67 60%, with extensive DCIS -Diagnosed in October 2024 -I reviewed his surgical pathology findings, which showed DCIS, 8 mm invasive ductal carcinoma, repeated prognostic panel showed ER 20% weak positive, PR and HER2 negative with high Ki-67 60%, grade 3. -Due to the aggressive nature of her functional triple negative disease, I recommend her to consider adjuvant chemotherapy -Due to the positive margin, she proceeded with re-excision surgery on 11/13/2022 which showed additional 1cm invasive cancer, final margins were negative  -I recommend adjuvant chemo TC every 3 weeks for 4 cycles with G-CSF on day 3, she started on 12/11/2023.  Due to drug-drug interaction with her HIV medication, I reduced docetaxel  dose by 50%. She completed on 02/12/2024, overall tolerated well  - She started adjuvant radiation on 6/3.
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
## 2024-05-13 ENCOUNTER — Ambulatory Visit
Admission: RE | Admit: 2024-05-13 | Discharge: 2024-05-13 | Disposition: A | Discharge status: Home / Self Care | Source: Ambulatory Visit | Attending: Radiation Oncology | Admitting: Radiation Oncology

## 2024-05-13 ENCOUNTER — Other Ambulatory Visit: Payer: Self-pay

## 2024-05-13 ENCOUNTER — Ambulatory Visit

## 2024-05-13 DIAGNOSIS — C50412 Malignant neoplasm of upper-outer quadrant of left female breast: Secondary | ICD-10-CM | POA: Diagnosis not present

## 2024-05-13 LAB — RAD ONC ARIA SESSION SUMMARY
Course Elapsed Days: 28
Plan Fractions Treated to Date: 4
Plan Prescribed Dose Per Fraction: 2 Gy
Plan Total Fractions Prescribed: 4
Plan Total Prescribed Dose: 8 Gy
Reference Point Dosage Given to Date: 8 Gy
Reference Point Session Dosage Given: 2 Gy
Session Number: 20

## 2024-05-14 NOTE — Radiation Completion Notes (Addendum)
  Radiation Oncology         [phone] ________________________________  Name: Nicole [name] MRN: [id number]  Date of Service: 05/13/2024  DOB: April 16, 1961  End of Treatment Note  Diagnosis:  Multifocal Stage IB, pT1bN0M0, grade 3, functionally triple negative invasive ductal carcinoma of the left breast with associated DCIS   Intent: Curative     ==========DELIVERED PLANS==========  First Treatment Date: 2024-04-15 Last Treatment Date: 2024-05-13   Plan Name: Breast_L Site: Breast, Left Technique: 3D Mode: Photon Dose Per Fraction: 2.66 Gy Prescribed Dose (Delivered / Prescribed): 42.56 Gy / 42.56 Gy Prescribed Fxs (Delivered / Prescribed): 16 / 16   Plan Name: Breast_L_Bst Site: Breast, Left Technique: 3D Mode: Photon Dose Per Fraction: 2 Gy Prescribed Dose (Delivered / Prescribed): 8 Gy / 8 Gy Prescribed Fxs (Delivered / Prescribed): 4 / 4     ==========ON TREATMENT VISIT DATES========== 2024-04-17, 2024-04-24, 2024-05-01, 2024-05-11    See weekly On Treatment Notes in Epic for details in the Media tab (listed as Progress notes on the On Treatment Visit Dates listed above). The patient tolerated radiation. She developed fatigue.   The patient will receive a call in about one month from the radiation oncology department. She will continue follow up with Dr. Lanny as well.      Donald KYM Husband, PAC

## 2024-05-17 ENCOUNTER — Other Ambulatory Visit: Payer: Self-pay

## 2024-05-18 ENCOUNTER — Other Ambulatory Visit: Payer: Self-pay

## 2024-05-18 DIAGNOSIS — E119 Type 2 diabetes mellitus without complications: Secondary | ICD-10-CM

## 2024-05-18 DIAGNOSIS — B20 Human immunodeficiency virus [HIV] disease: Secondary | ICD-10-CM

## 2024-05-18 MED ORDER — ROSUVASTATIN CALCIUM 20 MG PO TABS: 20.0000 mg | ORAL_TABLET | Freq: Every day | ORAL | 1 refills | Status: DC

## 2024-05-20 ENCOUNTER — Ambulatory Visit

## 2024-05-20 VITALS — Ht 67.0 in | Wt 219.0 lb

## 2024-05-20 DIAGNOSIS — Z0000 Encounter for general adult medical examination without abnormal findings: Secondary | ICD-10-CM

## 2024-05-20 NOTE — Patient Instructions (Signed)
 Nicole [name] , Thank you for taking time out of your busy schedule to complete your Annual Wellness Visit with me. I enjoyed our conversation and look forward to speaking with you again next year. I, as well as your care team,  appreciate your ongoing commitment to your health goals. Please review the following plan we discussed and let me know if I can assist you in the future. Your Game plan/ To Do List    Referrals: If you haven't heard from the office you've been referred to, please reach out to them at the phone provided.    Follow up Visits: Next Medicare AWV with our clinical staff: 05/26/2025 at 2:20 pm phone visit with Nurse Roz   Have you seen your provider in the last 6 months (3 months if uncontrolled diabetes)? No Next Office Visit with your provider: Office will cal patient to schedule.  Clinician Recommendations:  Aim for 30 minutes of exercise or brisk walking, 6-8 glasses of water, and 5 servings of fruits and vegetables each day.       This is a list of the screening recommended for you and due dates:  Health Maintenance  Topic Date Due   Zoster (Shingles) Vaccine (1 of 2) Never done   Screening for Lung Cancer  Never done   Eye exam for diabetics  01/26/2020   COVID-19 Vaccine (4 - 2024-25 season) 07/14/2023   Yearly kidney health urinalysis for diabetes  12/05/2023   Complete foot exam   12/05/2023   Hemoglobin A1C  03/09/2024   Flu Shot  06/12/2024   Mammogram  07/22/2024   Yearly kidney function blood test for diabetes  03/18/2025   Medicare Annual Wellness Visit  05/20/2025   DTaP/Tdap/Td vaccine (2 - Td or Tdap) 05/17/2027   Colon Cancer Screening  06/01/2032   Pneumococcal Vaccination  Completed   Hepatitis B Vaccine  Completed   Hepatitis C Screening  Completed   HIV Screening  Completed   HPV Vaccine  Aged Out   Meningitis B Vaccine  Aged Out    Advanced directives: (Declined) Advance directive discussed with you today. Even though you declined this  today, please call our office should you change your mind, and we can give you the proper paperwork for you to fill out. Advance Care Planning is important because it:  [x]  Makes sure you receive the medical care that is consistent with your values, goals, and preferences  [x]  It provides guidance to your family and loved ones and reduces their decisional burden about whether or not they are making the right decisions based on your wishes.  Follow the link provided in your after visit summary or read over the paperwork we have mailed to you to help you started getting your Advance Directives in place. If you need assistance in completing these, please reach out to us  so that we can help you!  See attachments for Preventive Care and Fall Prevention Tips.
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
## 2024-05-22 ENCOUNTER — Other Ambulatory Visit: Payer: Self-pay

## 2024-06-04 ENCOUNTER — Other Ambulatory Visit: Payer: Self-pay

## 2024-06-04 ENCOUNTER — Encounter

## 2024-06-04 MED ORDER — ANASTROZOLE 1 MG PO TABS: 1.0000 mg | ORAL_TABLET | Freq: Every day | ORAL | 3 refills | Status: DC

## 2024-06-11 ENCOUNTER — Other Ambulatory Visit: Payer: Self-pay | Admitting: *Deleted

## 2024-06-11 NOTE — Patient Instructions (Signed)
 Visit Information  Thank you for taking time to visit with me today. Please don't hesitate to contact me if I can be of assistance to you before our next scheduled appointment.  Your next care management appointment is by telephone on 06/30/2024 at 1:30 pm  Please call the care guide team at [phone] if you need to cancel, schedule, or reschedule an appointment.   Please call the Suicide and Crisis Lifeline: 988 call the [place]  National Suicide Prevention Lifeline: (504)103-0916 or TTY: (303)423-6861 TTY (773)119-2963) to talk to a trained counselor call 1-800-273-TALK (toll free, 24 hour hotline) if you are experiencing a Mental Health or Behavioral Health Crisis or need someone to talk to.   Olam Ku, RN, BSN [place]  [facility], [facility] RN Care Manager Direct Dial: [phone]  Fax: [phone]
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
## 2024-06-15 ENCOUNTER — Ambulatory Visit
Admission: RE | Admit: 2024-06-15 | Discharge: 2024-06-15 | Disposition: A | Discharge status: Home / Self Care | Source: Ambulatory Visit | Attending: Hematology | Admitting: Hematology

## 2024-06-15 DIAGNOSIS — C50412 Malignant neoplasm of upper-outer quadrant of left female breast: Secondary | ICD-10-CM | POA: Insufficient documentation

## 2024-06-15 DIAGNOSIS — Z170 Estrogen receptor positive status [ER+]: Secondary | ICD-10-CM | POA: Insufficient documentation

## 2024-06-15 NOTE — Progress Notes (Signed)
 I reviewed the AWV findings of the licensed medical professional who conducted the visit. I was present in the office suite and immediately available to provide assistance and direction throughout the time the service was provided.

## 2024-06-15 NOTE — Progress Notes (Signed)
 ____________________________       Radiation Oncology         [phone] ________________________________  Name: Nicole [name] MRN: [id number]  Date of Service: 06/15/2024  DOB: December 14, 1960  Post Treatment Telephone Note  Diagnosis:  Multifocal Stage IB, [id number], grade 3, functionally triple negative invasive ductal carcinoma of the left breast with associated DCIS     The patient was available for call today.   Symptoms of fatigue have improved since completing therapy.  Symptoms of skin changes have improved since completing therapy.  The patient was encouraged to avoid sun exposure in the area of prior treatment for up to one year following radiation with either sunscreen or by the style of clothing worn in the sun.  The patient has scheduled follow up on 06/15/24 with her medical oncologist Dr. Lanny for ongoing surveillance, and was encouraged to call if she develops concerns or questions regarding radiation.     No vitals were taken during this visit.

## 2024-06-16 ENCOUNTER — Ambulatory Visit: Admitting: Student

## 2024-06-16 VITALS — BP 120/77 | HR 76 | Temp 97.8°F | Ht 67.0 in

## 2024-06-16 DIAGNOSIS — F17210 Nicotine dependence, cigarettes, uncomplicated: Secondary | ICD-10-CM

## 2024-06-16 DIAGNOSIS — Z7985 Long-term (current) use of injectable non-insulin antidiabetic drugs: Secondary | ICD-10-CM

## 2024-06-16 DIAGNOSIS — Z170 Estrogen receptor positive status [ER+]: Secondary | ICD-10-CM

## 2024-06-16 DIAGNOSIS — I10 Essential (primary) hypertension: Secondary | ICD-10-CM

## 2024-06-16 DIAGNOSIS — E785 Hyperlipidemia, unspecified: Secondary | ICD-10-CM | POA: Diagnosis not present

## 2024-06-16 DIAGNOSIS — Z7984 Long term (current) use of oral hypoglycemic drugs: Secondary | ICD-10-CM

## 2024-06-16 DIAGNOSIS — E119 Type 2 diabetes mellitus without complications: Secondary | ICD-10-CM

## 2024-06-16 DIAGNOSIS — C50412 Malignant neoplasm of upper-outer quadrant of left female breast: Secondary | ICD-10-CM

## 2024-06-16 DIAGNOSIS — B20 Human immunodeficiency virus [HIV] disease: Secondary | ICD-10-CM

## 2024-06-16 DIAGNOSIS — F17200 Nicotine dependence, unspecified, uncomplicated: Secondary | ICD-10-CM

## 2024-06-16 DIAGNOSIS — F419 Anxiety disorder, unspecified: Secondary | ICD-10-CM

## 2024-06-16 DIAGNOSIS — Z8673 Personal history of transient ischemic attack (TIA), and cerebral infarction without residual deficits: Secondary | ICD-10-CM

## 2024-06-16 LAB — POCT GLYCOSYLATED HEMOGLOBIN (HGB A1C): Hemoglobin A1C: 7.1 % — AB (ref 4.0–5.6)

## 2024-06-16 LAB — GLUCOSE, CAPILLARY: Glucose-Capillary: 152 mg/dL — ABNORMAL HIGH (ref 70–99)

## 2024-06-16 MED ORDER — ASPIRIN 81 MG PO TBEC: 81.0000 mg | DELAYED_RELEASE_TABLET | Freq: Every day | ORAL | 3 refills | Status: AC

## 2024-06-16 MED ORDER — BUPROPION HCL ER (SR) 150 MG PO TB12: 150.0000 mg | ORAL_TABLET | Freq: Every day | ORAL | 3 refills | Status: AC

## 2024-06-16 MED ORDER — DAPAGLIFLOZIN PROPANEDIOL 10 MG PO TABS: 10.0000 mg | ORAL_TABLET | Freq: Every day | ORAL | 3 refills | Status: AC

## 2024-06-16 MED ORDER — SEMAGLUTIDE (1 MG/DOSE) 4 MG/3ML ~~LOC~~ SOPN: 1.0000 mg | PEN_INJECTOR | SUBCUTANEOUS | 3 refills | Status: DC

## 2024-06-16 MED ORDER — ESCITALOPRAM OXALATE 20 MG PO TABS: 20.0000 mg | ORAL_TABLET | Freq: Every day | ORAL | 3 refills | Status: AC

## 2024-06-16 NOTE — Assessment & Plan Note (Addendum)
 A1c today is 7.1. It has been well-controlled recently with elevation today somewhat attributable to being on Decadron  for 5 weeks (taken as part of treatment for breast cancer) since A1c was last checked 11/2023.  Managed with Farxiga  10 mg daily and Ozempic  0.5 mg weekly. I will increase Ozempic  to 1 mg today as she has been tolerating 0.5 well. BMP and Microalbumin to Creatinine Ratio today.

## 2024-06-16 NOTE — Assessment & Plan Note (Signed)
 Stable. Managed with Lexapro  20 mg daily and Wellbutrin  150 mg daily.

## 2024-06-16 NOTE — Assessment & Plan Note (Signed)
 Normotensive. Managed with olmesartan -amlodipine -hydrochlorothiazide  40-5-12.5 mg daily. She has a home health aide that comes every day and will occasionally check BP. Patient states she has seen some low BP's with first number being 60 at times. Unclear how accurate this is, but I spoke with patient and sister who was present during visit about having aide check BP twice per day for the next week. Instructed to call if first number is consistently below 100 or with over 140.

## 2024-06-16 NOTE — Assessment & Plan Note (Signed)
 Prior stroke with residual left-sided hemiparesis. Continue ASA and Crestor .

## 2024-06-16 NOTE — Assessment & Plan Note (Signed)
 LDL was 59 11/2023. Managed with Crestor  20 mg daily. Goal for secondary prevention of < 70. Diabetic foot examination today with left foot noticeably cooler than right but pulses were palpated and ABI's for left and right were 1.23 and 1.29 respectively.

## 2024-06-16 NOTE — Assessment & Plan Note (Signed)
 Continues to smoke 1/3 ppd. I extensively counseled on smoking cessation today. Talked about setting a specific quit date. Referral sent for CT Chest lung cancer screening.

## 2024-06-16 NOTE — Assessment & Plan Note (Signed)
 Stable. Follows with Dr. Eben and managed with Genvoya .

## 2024-06-16 NOTE — Assessment & Plan Note (Signed)
 Follows with Oncology (Dr. Lanny) and last seen 05/2024. She has completed radiation and was started on Anastrozole  given ER positivity. DEXA scan also ordered at that visit which is upcoming.
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
## 2024-06-17 ENCOUNTER — Other Ambulatory Visit: Payer: Self-pay | Admitting: Student

## 2024-06-17 ENCOUNTER — Encounter: Payer: Self-pay | Admitting: Hematology

## 2024-06-17 ENCOUNTER — Ambulatory Visit: Payer: Self-pay | Admitting: Student

## 2024-06-17 ENCOUNTER — Encounter (HOSPITAL_BASED_OUTPATIENT_CLINIC_OR_DEPARTMENT_OTHER): Attending: General Surgery | Admitting: General Surgery

## 2024-06-17 DIAGNOSIS — E11621 Type 2 diabetes mellitus with foot ulcer: Secondary | ICD-10-CM | POA: Diagnosis present

## 2024-06-17 DIAGNOSIS — G629 Polyneuropathy, unspecified: Secondary | ICD-10-CM | POA: Insufficient documentation

## 2024-06-17 DIAGNOSIS — L97419 Non-pressure chronic ulcer of right heel and midfoot with unspecified severity: Secondary | ICD-10-CM | POA: Diagnosis not present

## 2024-06-17 DIAGNOSIS — Z8673 Personal history of transient ischemic attack (TIA), and cerebral infarction without residual deficits: Secondary | ICD-10-CM | POA: Diagnosis not present

## 2024-06-17 LAB — BASIC METABOLIC PANEL WITH GFR
BUN/Creatinine Ratio: 34 — ABNORMAL HIGH (ref 12–28)
BUN: 21 mg/dL (ref 8–27)
CO2: 20 mmol/L (ref 20–29)
Calcium: 9.6 mg/dL (ref 8.7–10.3)
Chloride: 105 mmol/L (ref 96–106)
Creatinine, Ser: 0.62 mg/dL (ref 0.57–1.00)
Glucose: 133 mg/dL — ABNORMAL HIGH (ref 70–99)
Potassium: 3.7 mmol/L (ref 3.5–5.2)
Sodium: 141 mmol/L (ref 134–144)
eGFR: 99 mL/min/1.73 (ref >59)

## 2024-06-17 LAB — MICROALBUMIN / CREATININE URINE RATIO
Creatinine, Urine: 51.2 mg/dL
Microalb/Creat Ratio: 64 mg/g{creat} — ABNORMAL HIGH (ref 0–29)
Microalbumin, Urine: 32.8 ug/mL

## 2024-06-17 NOTE — Addendum Note (Signed)
 Addended by: Samella Lucchetti L on: 06/17/2024 11:21 AM   Modules accepted: Level of Service

## 2024-06-17 NOTE — Progress Notes (Signed)
 Internal Medicine Clinic Attending  Case discussed with the resident at the time of the visit.  We reviewed the resident's history and exam and pertinent patient test results.  I agree with the assessment, diagnosis, and plan of care documented in the resident's note.

## 2024-06-18 NOTE — Addendum Note (Signed)
 Addended by: Lavanna Rog F on: 06/18/2024 03:45 PM   Modules accepted: Orders

## 2024-06-18 NOTE — Addendum Note (Signed)
 Addended by: MARYLU GEE on: 06/18/2024 03:33 PM   Modules accepted: Orders

## 2024-06-18 NOTE — Addendum Note (Signed)
 Addended by: MARYLU GEE on: 06/18/2024 03:34 PM   Modules accepted: Level of Service

## 2024-06-23 ENCOUNTER — Telehealth: Payer: Self-pay | Admitting: *Deleted

## 2024-06-23 NOTE — Telephone Encounter (Unsigned)
 Copied from CRM [id number]. Topic: Clinical - Order For Equipment >> Jun 23, 2024 10:55 AM Alfonso ORN wrote: Reason for CRM: Jenkins calling from [employer]  Its for patient diapers and incontinence supplies   Have fax paperwork on 8/3 and 8/5 ,  Requested md order, cmn and office notes  Checking on status , have not received anything back  Need soon as possible , if can have by 06/26/24    Call back # [phone]

## 2024-06-24 ENCOUNTER — Ambulatory Visit (INDEPENDENT_AMBULATORY_CARE_PROVIDER_SITE_OTHER): Admitting: Podiatry

## 2024-06-24 ENCOUNTER — Encounter: Payer: Self-pay | Admitting: Podiatry

## 2024-06-24 DIAGNOSIS — M205X2 Other deformities of toe(s) (acquired), left foot: Secondary | ICD-10-CM

## 2024-06-24 DIAGNOSIS — M79674 Pain in right toe(s): Secondary | ICD-10-CM

## 2024-06-24 DIAGNOSIS — E119 Type 2 diabetes mellitus without complications: Secondary | ICD-10-CM

## 2024-06-24 DIAGNOSIS — E1140 Type 2 diabetes mellitus with diabetic neuropathy, unspecified: Secondary | ICD-10-CM

## 2024-06-24 DIAGNOSIS — Z8631 Personal history of diabetic foot ulcer: Secondary | ICD-10-CM | POA: Diagnosis not present

## 2024-06-24 DIAGNOSIS — L84 Corns and callosities: Secondary | ICD-10-CM | POA: Diagnosis not present

## 2024-06-24 DIAGNOSIS — E1149 Type 2 diabetes mellitus with other diabetic neurological complication: Secondary | ICD-10-CM

## 2024-06-24 DIAGNOSIS — B351 Tinea unguium: Secondary | ICD-10-CM | POA: Diagnosis not present

## 2024-06-24 DIAGNOSIS — M79675 Pain in left toe(s): Secondary | ICD-10-CM

## 2024-06-25 ENCOUNTER — Other Ambulatory Visit

## 2024-06-25 ENCOUNTER — Other Ambulatory Visit: Payer: Self-pay | Admitting: Student

## 2024-06-25 ENCOUNTER — Encounter: Payer: Self-pay | Admitting: Podiatry

## 2024-06-25 DIAGNOSIS — I10 Essential (primary) hypertension: Secondary | ICD-10-CM

## 2024-06-25 DIAGNOSIS — K219 Gastro-esophageal reflux disease without esophagitis: Secondary | ICD-10-CM
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
## 2024-06-26 ENCOUNTER — Telehealth: Payer: Self-pay | Admitting: *Deleted

## 2024-06-26 NOTE — Telephone Encounter (Unsigned)
 Copied from CRM [id number]. Topic: Clinical - Order For Equipment >> Jun 26, 2024  8:46 AM Zane F wrote: Reason for CRM:   Caller: Ann  Calling From: [employer]   Calling to confirm whether or not we received the fax request for incontinence supplies, that confirmed the MD order CMN and the office notes to verify the need.; CAL did inform the specialist that they are unable to confirm the receipt of the fax due to staff shortage and to request they refax the document.   Callback Number: [phone] ext [phone]  Fax Number: [phone]

## 2024-06-29 MED ORDER — OLMESARTAN-AMLODIPINE-HCTZ 40-5-12.5 MG PO TABS: 1.0000 | ORAL_TABLET | Freq: Every day | ORAL | 2 refills | Status: DC

## 2024-06-30 ENCOUNTER — Other Ambulatory Visit: Payer: Self-pay | Admitting: *Deleted

## 2024-06-30 NOTE — Patient Instructions (Signed)
 Visit Information  Thank you for taking time to visit with me today. Please don't hesitate to contact me if I can be of assistance to you before our next scheduled appointment.  Your next care management appointment is by telephone on 07/31/2024 at 1:30 pm  Please call the care guide team at [phone] if you need to cancel, schedule, or reschedule an appointment.   Please call the Suicide and Crisis Lifeline: 988 call the [place]  National Suicide Prevention Lifeline: 279-714-7359 or TTY: 203 749 9236 TTY (313) 530-1219) to talk to a trained counselor call 1-800-273-TALK (toll free, 24 hour hotline) if you are experiencing a Mental Health or Behavioral Health Crisis or need someone to talk to.   Olam Ku, RN, BSN   [facility], [facility] RN Care Manager Direct Dial: [phone]  Fax: [phone]
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
## 2024-07-01 ENCOUNTER — Ambulatory Visit
Admission: RE | Admit: 2024-07-01 | Discharge: 2024-07-01 | Disposition: A | Discharge status: Home / Self Care | Source: Ambulatory Visit

## 2024-07-01 DIAGNOSIS — F17200 Nicotine dependence, unspecified, uncomplicated: Secondary | ICD-10-CM

## 2024-07-03 ENCOUNTER — Encounter

## 2024-07-08 NOTE — Telephone Encounter (Unsigned)
 Copied from CRM #[id number]. Topic: Clinical - Order For Equipment >> Jul 08, 2024 10:31 AM Farrel B wrote: Reason for CRM: [id number] Jenkins from [place] called in regards to the patient's CMN that was sent over on the 22nd of August requesting all notes for the patient. Ms. Jenkins stated she will fax the request back over, and requested a call back to let her know the document request has been received via fax

## 2024-07-14 ENCOUNTER — Telehealth: Payer: Self-pay | Admitting: *Deleted

## 2024-07-14 NOTE — Telephone Encounter (Signed)
 Copied from CRM #[id number]. Topic: Clinical - Prescription Issue >> Jul 14, 2024 12:36 PM Cherylann RAMAN wrote: Reason for CRM: Arlean with [employer] regarding Incontinence supplies. Paperwork was sent over on 08/28 and they did receive it back. However, the form was incomplete. Please complete dx lines 1-2, lines 19-27, and notes from recent visits to verify the needs of incontinence supplies. Please Contact Anne at [phone] ext. [phone] for additional information.

## 2024-07-17 NOTE — Telephone Encounter (Signed)
 Received call from Arlean with [employer] regarding Incontinence supplies Checking on the status of the paperwork Informed Arlean that pt has appt scheduled for 9/8 to discuss need for supplies And paperwork will be completed at that time.Freddi, Jerie Basford Cassady9/5/202510:08 AM

## 2024-07-20 ENCOUNTER — Encounter: Admitting: Student

## 2024-07-20 ENCOUNTER — Ambulatory Visit: Admitting: Student
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
## 2024-07-28 ENCOUNTER — Telehealth: Payer: Self-pay | Admitting: *Deleted

## 2024-07-28 ENCOUNTER — Ambulatory Visit (INDEPENDENT_AMBULATORY_CARE_PROVIDER_SITE_OTHER): Admitting: Student

## 2024-07-28 DIAGNOSIS — R3981 Functional urinary incontinence: Secondary | ICD-10-CM
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
## 2024-07-28 NOTE — Assessment & Plan Note (Signed)
 Patient reports a 2-year history of incontinence that started after she had a stroke with residual left-sided hemiparesis.  Patient denies incontinence throughout the day.  She reports incontinence throughout the night due to not being able to get herself out of bed in time to go to the restroom.  She struggles getting out of bed due to the residual left sided hemiparesis.  For the patient's functional incontinence, she will benefit with DME incontinence supplies. Plan: - DME referral placed for wipes, pads, pull-ups, large gloves

## 2024-07-28 NOTE — Telephone Encounter (Unsigned)
 Copied from CRM [id number]. Topic: Clinical - Order For Equipment >> Jul 28, 2024  3:38 PM Farrel B wrote: Reason for CRM: For home use only DME Other see comment (Order [phone] caretaker Ms. Blondie call in regards to an order for some Orthotic shoes for the patients. She stated that she had spoken to someone over at [facility] who stated they had sent the order over for the pcp to sign off on it but had not been received. I included the order number for the DME request. Please call the patient's caretaker to advise

## 2024-07-28 NOTE — Addendum Note (Signed)
 Addended by: KANDIS PERKINS on: 07/28/2024 04:12 PM   Modules accepted: Level of Service

## 2024-07-29 NOTE — Progress Notes (Signed)
 Internal Medicine Clinic Attending  Case discussed with the resident at the time of the visit.  We reviewed the resident's history and exam and pertinent patient test results.  I agree with the assessment, diagnosis, and plan of care documented in the resident's note.

## 2024-07-30 LAB — HM DIABETES EYE EXAM

## 2024-07-31 ENCOUNTER — Other Ambulatory Visit: Payer: Self-pay | Admitting: *Deleted

## 2024-07-31 NOTE — Patient Instructions (Signed)
 Visit Information  Thank you for taking time to visit with me today. Please don't hesitate to contact me if I can be of assistance to you before our next scheduled appointment.  Your next care management appointment is by telephone on 08/26/2024 at 10:00 am  Please call the care guide team at [phone] if you need to cancel, schedule, or reschedule an appointment.   Please call the Suicide and Crisis Lifeline: 988 call the [place]  National Suicide Prevention Lifeline: 361-439-8427 or TTY: 838 021 1705 TTY 616-139-2055) to talk to a trained counselor call 1-800-273-TALK (toll free, 24 hour hotline) if you are experiencing a Mental Health or Behavioral Health Crisis or need someone to talk to.  Olam Ku, RN, BSN [facility], [facility] RN Care Manager Direct Dial: [phone]  Fax: [phone]
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
## 2024-08-02 ENCOUNTER — Other Ambulatory Visit: Payer: Self-pay | Admitting: Student

## 2024-08-02 DIAGNOSIS — E119 Type 2 diabetes mellitus without complications: Secondary | ICD-10-CM

## 2024-08-02 DIAGNOSIS — B20 Human immunodeficiency virus [HIV] disease: Secondary | ICD-10-CM

## 2024-08-04 ENCOUNTER — Telehealth: Payer: Self-pay | Admitting: *Deleted

## 2024-08-04 NOTE — Telephone Encounter (Signed)
 Spoke with patient regarding her calling breast center to schedule her mammogram/ patient states she has the phone number to the breast center  [phone].

## 2024-08-10 NOTE — Telephone Encounter (Signed)
 Called [facility] and confirmed all the documents were already rec'd correctly and they have already made contact with the patient and a delivery date has been set as well.  Copied from CRM [id number]. Topic: General - Other >> Aug 07, 2024  1:04 PM Brittney F wrote: Reason for CRM:   Caller: Rosina  Calling From: [facility]  Calling to confirm the patient's contact number. Specialist did not relay information. Please contact the patient and provide the patient with their contact information listed below. They are looking to inform the patient on how to receive her incontinence supplies and confirm all information for initial shipment.    Callback Number: [phone] ext [phone]

## 2024-08-11 ENCOUNTER — Telehealth: Payer: Self-pay | Admitting: *Deleted

## 2024-08-11 NOTE — Telephone Encounter (Signed)
 LVM for patient to call the breast center [phone]) to schedule her mammogram /  if patient do not keep her appointment when made she will be charged a 75.00 not show fee.

## 2024-08-12 ENCOUNTER — Telehealth: Payer: Self-pay | Admitting: *Deleted

## 2024-08-12 ENCOUNTER — Other Ambulatory Visit: Payer: Self-pay | Admitting: Hematology

## 2024-08-12 NOTE — Telephone Encounter (Signed)
 Will forward to C. Boone.                    Copied from CRM #[id number]. Topic: General - Other >> Aug 12, 2024  2:30 PM Mercer PEDLAR wrote: Reason for CRM: Patient would like a callback for update on order for diabetic shoes.

## 2024-08-13 ENCOUNTER — Other Ambulatory Visit: Payer: Self-pay | Admitting: Student

## 2024-08-13 DIAGNOSIS — E119 Type 2 diabetes mellitus without complications: Secondary | ICD-10-CM

## 2024-08-17 ENCOUNTER — Inpatient Hospital Stay: Attending: Hematology | Admitting: Nurse Practitioner

## 2024-08-17 ENCOUNTER — Encounter: Payer: Self-pay | Admitting: Nurse Practitioner

## 2024-08-17 VITALS — BP 110/80 | HR 70 | Temp 97.7°F | Resp 17 | Wt 219.0 lb

## 2024-08-17 DIAGNOSIS — Z21 Asymptomatic human immunodeficiency virus [HIV] infection status: Secondary | ICD-10-CM | POA: Insufficient documentation

## 2024-08-17 DIAGNOSIS — L989 Disorder of the skin and subcutaneous tissue, unspecified: Secondary | ICD-10-CM | POA: Insufficient documentation

## 2024-08-17 DIAGNOSIS — Z833 Family history of diabetes mellitus: Secondary | ICD-10-CM | POA: Insufficient documentation

## 2024-08-17 DIAGNOSIS — Z8673 Personal history of transient ischemic attack (TIA), and cerebral infarction without residual deficits: Secondary | ICD-10-CM | POA: Insufficient documentation

## 2024-08-17 DIAGNOSIS — Z79899 Other long term (current) drug therapy: Secondary | ICD-10-CM | POA: Diagnosis not present

## 2024-08-17 DIAGNOSIS — Z1732 Human epidermal growth factor receptor 2 negative status: Secondary | ICD-10-CM | POA: Diagnosis not present

## 2024-08-17 DIAGNOSIS — Z993 Dependence on wheelchair: Secondary | ICD-10-CM | POA: Insufficient documentation

## 2024-08-17 DIAGNOSIS — N6489 Other specified disorders of breast: Secondary | ICD-10-CM | POA: Diagnosis not present

## 2024-08-17 DIAGNOSIS — Z96651 Presence of right artificial knee joint: Secondary | ICD-10-CM | POA: Diagnosis not present

## 2024-08-17 DIAGNOSIS — Z8249 Family history of ischemic heart disease and other diseases of the circulatory system: Secondary | ICD-10-CM | POA: Insufficient documentation

## 2024-08-17 DIAGNOSIS — Z881 Allergy status to other antibiotic agents status: Secondary | ICD-10-CM | POA: Insufficient documentation

## 2024-08-17 DIAGNOSIS — Z170 Estrogen receptor positive status [ER+]: Secondary | ICD-10-CM | POA: Insufficient documentation

## 2024-08-17 DIAGNOSIS — I10 Essential (primary) hypertension: Secondary | ICD-10-CM | POA: Insufficient documentation

## 2024-08-17 DIAGNOSIS — F17200 Nicotine dependence, unspecified, uncomplicated: Secondary | ICD-10-CM | POA: Insufficient documentation

## 2024-08-17 DIAGNOSIS — Z8619 Personal history of other infectious and parasitic diseases: Secondary | ICD-10-CM | POA: Insufficient documentation

## 2024-08-17 DIAGNOSIS — Z800 Family history of malignant neoplasm of digestive organs: Secondary | ICD-10-CM | POA: Diagnosis not present

## 2024-08-17 DIAGNOSIS — Z8042 Family history of malignant neoplasm of prostate: Secondary | ICD-10-CM | POA: Insufficient documentation

## 2024-08-17 DIAGNOSIS — Z90710 Acquired absence of both cervix and uterus: Secondary | ICD-10-CM | POA: Insufficient documentation

## 2024-08-17 DIAGNOSIS — F419 Anxiety disorder, unspecified: Secondary | ICD-10-CM | POA: Diagnosis not present

## 2024-08-17 DIAGNOSIS — C50412 Malignant neoplasm of upper-outer quadrant of left female breast: Secondary | ICD-10-CM | POA: Diagnosis not present

## 2024-08-17 DIAGNOSIS — E119 Type 2 diabetes mellitus without complications: Secondary | ICD-10-CM | POA: Diagnosis not present

## 2024-08-17 DIAGNOSIS — Z79811 Long term (current) use of aromatase inhibitors: Secondary | ICD-10-CM | POA: Insufficient documentation

## 2024-08-17 DIAGNOSIS — Z96643 Presence of artificial hip joint, bilateral: Secondary | ICD-10-CM | POA: Insufficient documentation

## 2024-08-17 DIAGNOSIS — Z1722 Progesterone receptor negative status: Secondary | ICD-10-CM | POA: Diagnosis not present

## 2024-08-17 DIAGNOSIS — Z7982 Long term (current) use of aspirin: Secondary | ICD-10-CM | POA: Diagnosis not present
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
## 2024-08-18 ENCOUNTER — Other Ambulatory Visit: Payer: Self-pay

## 2024-08-18 DIAGNOSIS — C50412 Malignant neoplasm of upper-outer quadrant of left female breast: Secondary | ICD-10-CM

## 2024-08-18 NOTE — Progress Notes (Signed)
 Order placed successfully in portal for Signatera as per Lacie Burton NP, order was uploaded with paperwork needed and confirmation was received. Kit was placed in lab to be drawn on 08/31/2024 in flush.

## 2024-08-26 ENCOUNTER — Other Ambulatory Visit: Payer: Self-pay | Admitting: *Deleted

## 2024-08-26 NOTE — Patient Instructions (Signed)
 Visit Information  Thank you for taking time to visit with me today. Please don't hesitate to contact me if I can be of assistance to you before our next scheduled appointment.  Your next care management appointment is by telephone on 09/25/2024 at 10:00  Please call the care guide team at [phone] if you need to cancel, schedule, or reschedule an appointment.   Please call the Suicide and Crisis Lifeline: 988 call the [place]  National Suicide Prevention Lifeline: (609)411-5717 or TTY: 540-121-9114 TTY 817-713-1568) to talk to a trained counselor call 1-800-273-TALK (toll free, 24 hour hotline) if you are experiencing a Mental Health or Behavioral Health Crisis or need someone to talk to.   Olam Ku, RN, BSN [place]  [facility], [facility] RN Care Manager Direct Dial: [phone]  Fax: [phone]
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
## 2024-08-28 NOTE — Telephone Encounter (Signed)
 Message has been left again today to speak with Devere in Reference to  if the Attending can sign the DME paperwork for the the Resident who saw the patient.  Please see previous messages that was left in regards to a signature needed:   Me to Herbin, Gladys F, RN CB    08/14/24 11:01 AM A message was sent to [employer] to rtn a call as they will not accept any other PCP's signature but the Resident.   08/12/24  3:09 PM Herbin, Kenneth FALCON, RN routed this conversation to Me Burnett Kenneth FALCON, RN    08/12/24  3:09 PM Note Will forward to C. Boone.                    Copied from CRM #[id number]. Topic: General - Other >> Aug 12, 2024  2:30 PM Mercer PEDLAR wrote: Reason for CRM: Patient would like a callback for update on order for diabetic shoes.       08/12/24  2:32 PM Louana Montie RAMAN contacted Zeidan, Vivian    08/12/24  2:15 PM Louana Montie RAMAN contacted Zeidan, Viv  Copied from CRM [id number]. Topic: General - Call Back - No Documentation >> Aug 27, 2024  2:16 PM DeAngela L wrote: Reason for CRM: Ms Devere with [facility] called to ask if the office received a fax 08/21/24 for the patient to get diabetic shoes and inserts  And also for Dr Marylu to sign this fax  the previous fax was signed by a different provider and insurance denied it  Ms Devere will fax a new copy today for Dr Marylu to sign  Devere phone num [phone] ext [phone]  Fax num [phone]

## 2024-08-31 ENCOUNTER — Inpatient Hospital Stay

## 2024-08-31 DIAGNOSIS — Z170 Estrogen receptor positive status [ER+]: Secondary | ICD-10-CM

## 2024-08-31 DIAGNOSIS — C50412 Malignant neoplasm of upper-outer quadrant of left female breast: Secondary | ICD-10-CM | POA: Diagnosis not present

## 2024-08-31 LAB — GENETIC SCREENING ORDER

## 2024-09-20 ENCOUNTER — Ambulatory Visit: Payer: Self-pay | Admitting: Hematology

## 2024-09-20 LAB — SIGNATERA
SIGNATERA MTM READOUT: 1.35 MTM/ml — AB
SIGNATERA TEST RESULT: POSITIVE — AB

## 2024-09-25 ENCOUNTER — Encounter: Payer: Self-pay | Admitting: Hematology

## 2024-09-25 ENCOUNTER — Other Ambulatory Visit: Payer: Self-pay | Admitting: *Deleted

## 2024-09-25 NOTE — Patient Instructions (Signed)
 Visit Information  Thank you for taking time to visit with me today. Please don't hesitate to contact me if I can be of assistance to you before our next scheduled appointment.  Your next care management appointment is by telephone on 10/23/2024 at 10:00 am  Please call the care guide team at [phone] if you need to cancel, schedule, or reschedule an appointment.   Please call the Suicide and Crisis Lifeline: 988 call the [place]  National Suicide Prevention Lifeline: 973-486-3654 or TTY: 845-158-3789 TTY 910-823-8704) to talk to a trained counselor call 1-800-273-TALK (toll free, 24 hour hotline) if you are experiencing a Mental Health or Behavioral Health Crisis or need someone to talk to.   Olam Ku, RN, BSN [place]  [facility], [facility] RN Care Manager Direct Dial: [phone]  Fax: [phone]

## 2024-09-25 NOTE — Telephone Encounter (Addendum)
 Called patient to make her aware of the appointment time, no answer left a message on her VM that Dr. Lanny would call Monday morning around 8am.    ----- Message from Onita Lanny sent at 09/20/2024 11:22 PM EST ----- Please schedule an OV with me this week to review the Signatera result, or virtual visit if she prefers. Thx  Onita Lanny  ----- Message ----- From: Rebecka Lavender In [place] Nine Sent: 09/20/2024  11:23 AM EST To: Onita Lanny, MD
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
## 2024-09-27 NOTE — Assessment & Plan Note (Signed)
-  pT1bN0M0, stag I, G3, ER 20% WEAK, PR-, HER- (0), ki67 60%, with extensive DCIS -Diagnosed in October 2024 -I reviewed his surgical pathology findings, which showed DCIS, 8 mm invasive ductal carcinoma, repeated prognostic panel showed ER 20% weak positive, PR and HER2 negative with high Ki-67 60%, grade 3. -Due to the aggressive nature of her functional triple negative disease, I recommend her to consider adjuvant chemotherapy -Due to the positive margin, she proceeded with re-excision surgery on 11/13/2022 which showed additional 1cm invasive cancer, final margins were negative  -I recommend adjuvant chemo TC every 3 weeks for 4 cycles with G-CSF on day 3, she started on 12/11/2023.  Due to drug-drug interaction with her HIV medication, I reduced docetaxel  dose by 50%. She completed on 02/12/2024, overall tolerated well  - She completed adjuvant radiation on 05/13/2024 -she started adjuvant anastrozole  in July 2025  -Her Signatera in 09/2024 came back positive, will obtain images

## 2024-09-28 ENCOUNTER — Inpatient Hospital Stay: Attending: Hematology | Admitting: Hematology

## 2024-09-28 ENCOUNTER — Other Ambulatory Visit: Payer: Self-pay | Admitting: Hematology

## 2024-09-28 DIAGNOSIS — Z21 Asymptomatic human immunodeficiency virus [HIV] infection status: Secondary | ICD-10-CM | POA: Diagnosis not present

## 2024-09-28 DIAGNOSIS — Z170 Estrogen receptor positive status [ER+]: Secondary | ICD-10-CM | POA: Diagnosis not present

## 2024-09-28 DIAGNOSIS — Z1732 Human epidermal growth factor receptor 2 negative status: Secondary | ICD-10-CM | POA: Diagnosis not present

## 2024-09-28 DIAGNOSIS — F419 Anxiety disorder, unspecified: Secondary | ICD-10-CM | POA: Insufficient documentation

## 2024-09-28 DIAGNOSIS — Z1722 Progesterone receptor negative status: Secondary | ICD-10-CM | POA: Diagnosis not present

## 2024-09-28 DIAGNOSIS — Z79899 Other long term (current) drug therapy: Secondary | ICD-10-CM | POA: Insufficient documentation

## 2024-09-28 DIAGNOSIS — Z8673 Personal history of transient ischemic attack (TIA), and cerebral infarction without residual deficits: Secondary | ICD-10-CM | POA: Insufficient documentation

## 2024-09-28 DIAGNOSIS — E119 Type 2 diabetes mellitus without complications: Secondary | ICD-10-CM | POA: Diagnosis not present

## 2024-09-28 DIAGNOSIS — Z7982 Long term (current) use of aspirin: Secondary | ICD-10-CM | POA: Insufficient documentation

## 2024-09-28 DIAGNOSIS — Z79811 Long term (current) use of aromatase inhibitors: Secondary | ICD-10-CM | POA: Insufficient documentation

## 2024-09-28 DIAGNOSIS — I10 Essential (primary) hypertension: Secondary | ICD-10-CM | POA: Diagnosis not present

## 2024-09-28 DIAGNOSIS — Z923 Personal history of irradiation: Secondary | ICD-10-CM | POA: Insufficient documentation

## 2024-09-28 DIAGNOSIS — Z90710 Acquired absence of both cervix and uterus: Secondary | ICD-10-CM | POA: Diagnosis not present

## 2024-09-28 DIAGNOSIS — R059 Cough, unspecified: Secondary | ICD-10-CM | POA: Insufficient documentation

## 2024-09-28 DIAGNOSIS — Z881 Allergy status to other antibiotic agents status: Secondary | ICD-10-CM | POA: Diagnosis not present

## 2024-09-28 DIAGNOSIS — C50412 Malignant neoplasm of upper-outer quadrant of left female breast: Secondary | ICD-10-CM | POA: Diagnosis present

## 2024-09-28 DIAGNOSIS — Z8619 Personal history of other infectious and parasitic diseases: Secondary | ICD-10-CM | POA: Insufficient documentation
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
## 2024-09-29 ENCOUNTER — Ambulatory Visit: Admitting: Podiatry

## 2024-09-29 DIAGNOSIS — Z91199 Patient's noncompliance with other medical treatment and regimen due to unspecified reason: Secondary | ICD-10-CM

## 2024-09-29 NOTE — Progress Notes (Signed)
 1. No-show for appointment

## 2024-10-07 ENCOUNTER — Encounter (HOSPITAL_COMMUNITY): Payer: Self-pay

## 2024-10-07 ENCOUNTER — Other Ambulatory Visit: Payer: Self-pay

## 2024-10-07 DIAGNOSIS — C50412 Malignant neoplasm of upper-outer quadrant of left female breast: Secondary | ICD-10-CM

## 2024-10-12 ENCOUNTER — Encounter (HOSPITAL_COMMUNITY)
Admission: RE | Admit: 2024-10-12 | Discharge: 2024-10-12 | Disposition: A | Source: Ambulatory Visit | Attending: Hematology

## 2024-10-12 DIAGNOSIS — C50412 Malignant neoplasm of upper-outer quadrant of left female breast: Secondary | ICD-10-CM | POA: Diagnosis present

## 2024-10-12 DIAGNOSIS — Z170 Estrogen receptor positive status [ER+]: Secondary | ICD-10-CM | POA: Insufficient documentation

## 2024-10-12 LAB — GLUCOSE, CAPILLARY: Glucose-Capillary: 130 mg/dL — ABNORMAL HIGH (ref 70–99)

## 2024-10-12 MED ORDER — FLUDEOXYGLUCOSE F - 18 (FDG) INJECTION
11.0000 | Freq: Once | INTRAVENOUS | Status: AC
  Administered 2024-10-12: 10.51 via INTRAVENOUS

## 2024-10-23 ENCOUNTER — Other Ambulatory Visit: Payer: Self-pay | Admitting: *Deleted

## 2024-10-23 NOTE — Patient Instructions (Signed)
 Visit Information  Thank you for taking time to visit with me today. Please don't hesitate to contact me if I can be of assistance to you before our next scheduled appointment.  Your next care management appointment is by telephone on 11/20/2024 at 11:30 AM  Please call the care guide team at [phone] if you need to cancel, schedule, or reschedule an appointment.   Please call the Suicide and Crisis Lifeline: 988 call the [place]  National Suicide Prevention Lifeline: 510-491-1195 or TTY: (254) 536-8985 TTY (817) 648-2348) to talk to a trained counselor call 1-800-273-TALK (toll free, 24 hour hotline) if you are experiencing a Mental Health or Behavioral Health Crisis or need someone to talk to.   Olam Ku, RN, BSN [place]  [facility], [facility] RN Care Manager Direct Dial: [phone]  Fax: [phone]
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
## 2024-10-24 ENCOUNTER — Ambulatory Visit: Payer: Self-pay | Admitting: Hematology

## 2024-10-29 NOTE — Telephone Encounter (Addendum)
 Called patient to relay the below results as per Dr.Feng, patient voiced full understanding and had no further questions at this time. Patient stated she is feeling much better.    ----- Message from Onita Mattock, MD sent at 10/24/2024  7:41 PM EST ----- Please let pt know her PET was negative, and check how she is doing. If she is doing fine, ok to keep her next appointment in Jan 2026.  Onita Mattock
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
## 2024-11-16 ENCOUNTER — Ambulatory Visit: Payer: Self-pay

## 2024-11-16 ENCOUNTER — Encounter: Payer: Self-pay | Admitting: Hematology

## 2024-11-17 ENCOUNTER — Ambulatory Visit: Admitting: Hematology

## 2024-11-17 ENCOUNTER — Other Ambulatory Visit

## 2024-11-19 ENCOUNTER — Telehealth: Payer: Self-pay

## 2024-11-19 NOTE — Telephone Encounter (Signed)
 Copied from CRM #[id number]. Topic: General - Other >> Nov 19, 2024  9:30 AM Graeme ORN wrote: Reason for CRM: Case Manager  with [facility] Noemi (Other) [phone] called. States faxed something 12/30. Patient is having modifications doe to home. Need provider to complete form. She has not heard back. Would like callback with update. Thank You

## 2024-11-19 NOTE — Telephone Encounter (Signed)
 I contacted Ms. Noemi with [facility] to let her know that I have not received her form. Ms. Sherel states she will faxed the form to [facility] office. Forwarding message to [place].

## 2024-11-20 ENCOUNTER — Other Ambulatory Visit: Payer: Self-pay | Admitting: *Deleted

## 2024-11-20 ENCOUNTER — Other Ambulatory Visit: Payer: Self-pay

## 2024-11-20 DIAGNOSIS — C50412 Malignant neoplasm of upper-outer quadrant of left female breast: Secondary | ICD-10-CM

## 2024-11-20 NOTE — Patient Instructions (Signed)
 Visit Information  Thank you for taking time to visit with me today. Please don't hesitate to contact me if I can be of assistance to you before our next scheduled appointment.  Your next care management appointment is by telephone on 12/21/2024 @ 10:30 am with [place]  Mertel, RNCM  Please call the care guide team at [phone] if you need to cancel, schedule, or reschedule an appointment.   Please call the Suicide and Crisis Lifeline: 988 call the [place]  National Suicide Prevention Lifeline: 918-249-9833 or TTY: 8383577788 TTY 484-111-0565) to talk to a trained counselor call 1-800-273-TALK (toll free, 24 hour hotline) if you are experiencing a Mental Health or Behavioral Health Crisis or need someone to talk to.   Olam Ku, RN, BSN [place]  [facility], [facility] RN Care Manager Direct Dial: [phone]  Fax: [phone]
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
## 2024-11-22 NOTE — Assessment & Plan Note (Signed)
-  [id number], stag I, G3, ER 20% WEAK, PR-, HER- (0), ki67 60%, with extensive DCIS -Diagnosed in October 2024 -I reviewed his surgical pathology findings, which showed DCIS, 8 mm invasive ductal carcinoma, repeated prognostic panel showed ER 20% weak positive, PR and HER2 negative with high Ki-67 60%, grade 3. -Due to the aggressive nature of her functional triple negative disease, I recommend her to consider adjuvant chemotherapy -Due to the positive margin, she proceeded with re-excision surgery on 11/13/2022 which showed additional 1cm invasive cancer, final margins were negative  -I recommend adjuvant chemo TC every 3 weeks for 4 cycles with G-CSF on day 3, she started on 12/11/2023.  Due to drug-drug interaction with her HIV medication, I reduced docetaxel  dose by 50%. She completed on 02/12/2024, overall tolerated well  - She completed adjuvant radiation on 05/13/2024 -she started adjuvant anastrozole  in July 2025  -Her Signatera in 09/2024 came back positive, PET on 10/12/2024 was negative

## 2024-11-23 ENCOUNTER — Other Ambulatory Visit: Payer: Self-pay

## 2024-11-23 ENCOUNTER — Inpatient Hospital Stay: Admitting: Hematology

## 2024-11-23 ENCOUNTER — Inpatient Hospital Stay

## 2024-11-23 ENCOUNTER — Inpatient Hospital Stay: Attending: Hematology

## 2024-11-23 VITALS — BP 133/72 | HR 77 | Temp 97.7°F | Resp 17 | Wt 219.0 lb

## 2024-11-23 DIAGNOSIS — C50412 Malignant neoplasm of upper-outer quadrant of left female breast: Secondary | ICD-10-CM | POA: Diagnosis not present

## 2024-11-23 DIAGNOSIS — I10 Essential (primary) hypertension: Secondary | ICD-10-CM | POA: Insufficient documentation

## 2024-11-23 DIAGNOSIS — E1165 Type 2 diabetes mellitus with hyperglycemia: Secondary | ICD-10-CM | POA: Insufficient documentation

## 2024-11-23 DIAGNOSIS — Z8673 Personal history of transient ischemic attack (TIA), and cerebral infarction without residual deficits: Secondary | ICD-10-CM | POA: Diagnosis not present

## 2024-11-23 DIAGNOSIS — Z90710 Acquired absence of both cervix and uterus: Secondary | ICD-10-CM | POA: Insufficient documentation

## 2024-11-23 DIAGNOSIS — N6489 Other specified disorders of breast: Secondary | ICD-10-CM | POA: Diagnosis not present

## 2024-11-23 DIAGNOSIS — Z79811 Long term (current) use of aromatase inhibitors: Secondary | ICD-10-CM | POA: Insufficient documentation

## 2024-11-23 DIAGNOSIS — Z7982 Long term (current) use of aspirin: Secondary | ICD-10-CM | POA: Diagnosis not present

## 2024-11-23 DIAGNOSIS — Z923 Personal history of irradiation: Secondary | ICD-10-CM | POA: Insufficient documentation

## 2024-11-23 DIAGNOSIS — L905 Scar conditions and fibrosis of skin: Secondary | ICD-10-CM | POA: Insufficient documentation

## 2024-11-23 DIAGNOSIS — Z170 Estrogen receptor positive status [ER+]: Secondary | ICD-10-CM | POA: Insufficient documentation

## 2024-11-23 DIAGNOSIS — M1611 Unilateral primary osteoarthritis, right hip: Secondary | ICD-10-CM | POA: Diagnosis not present

## 2024-11-23 DIAGNOSIS — Z881 Allergy status to other antibiotic agents status: Secondary | ICD-10-CM | POA: Insufficient documentation

## 2024-11-23 DIAGNOSIS — R232 Flushing: Secondary | ICD-10-CM | POA: Diagnosis not present

## 2024-11-23 DIAGNOSIS — Z1722 Progesterone receptor negative status: Secondary | ICD-10-CM | POA: Insufficient documentation

## 2024-11-23 DIAGNOSIS — Z8619 Personal history of other infectious and parasitic diseases: Secondary | ICD-10-CM | POA: Insufficient documentation

## 2024-11-23 DIAGNOSIS — Z21 Asymptomatic human immunodeficiency virus [HIV] infection status: Secondary | ICD-10-CM | POA: Insufficient documentation

## 2024-11-23 DIAGNOSIS — Z79899 Other long term (current) drug therapy: Secondary | ICD-10-CM | POA: Diagnosis not present

## 2024-11-23 DIAGNOSIS — Z1732 Human epidermal growth factor receptor 2 negative status: Secondary | ICD-10-CM | POA: Diagnosis not present

## 2024-11-23 LAB — CBC WITH DIFFERENTIAL (CANCER CENTER ONLY)
Abs Immature Granulocytes: 0.04 K/uL (ref 0.00–0.07)
Basophils Absolute: 0 K/uL (ref 0.0–0.1)
Basophils Relative: 0 %
Eosinophils Absolute: 0 K/uL (ref 0.0–0.5)
Eosinophils Relative: 0 %
HCT: 44.7 % (ref 36.0–46.0)
Hemoglobin: 15.3 g/dL — ABNORMAL HIGH (ref 12.0–15.0)
Immature Granulocytes: 0 %
Lymphocytes Relative: 7 %
Lymphs Abs: 0.6 K/uL — ABNORMAL LOW (ref 0.7–4.0)
MCH: 30.9 pg (ref 26.0–34.0)
MCHC: 34.2 g/dL (ref 30.0–36.0)
MCV: 90.3 fL (ref 80.0–100.0)
Monocytes Absolute: 0.2 K/uL (ref 0.1–1.0)
Monocytes Relative: 3 %
Neutro Abs: 8.3 K/uL — ABNORMAL HIGH (ref 1.7–7.7)
Neutrophils Relative %: 90 %
Platelet Count: 213 K/uL (ref 150–400)
RBC: 4.95 MIL/uL (ref 3.87–5.11)
RDW: 12.7 % (ref 11.5–15.5)
WBC Count: 9.2 K/uL (ref 4.0–10.5)
nRBC: 0 % (ref 0.0–0.2)

## 2024-11-23 LAB — CMP (CANCER CENTER ONLY)
ALT: 16 U/L (ref 0–44)
AST: 23 U/L (ref 15–41)
Albumin: 4.5 g/dL (ref 3.5–5.0)
Alkaline Phosphatase: 92 U/L (ref 38–126)
Anion gap: 14 (ref 5–15)
BUN: 23 mg/dL (ref 8–23)
CO2: 24 mmol/L (ref 22–32)
Calcium: 9.9 mg/dL (ref 8.9–10.3)
Chloride: 105 mmol/L (ref 98–111)
Creatinine: 0.82 mg/dL (ref 0.44–1.00)
GFR, Estimated: >60 mL/min
Glucose, Bld: 250 mg/dL — ABNORMAL HIGH (ref 70–99)
Potassium: 3.8 mmol/L (ref 3.5–5.1)
Sodium: 143 mmol/L (ref 135–145)
Total Bilirubin: 0.3 mg/dL (ref 0.0–1.2)
Total Protein: 8.5 g/dL — ABNORMAL HIGH (ref 6.5–8.1)

## 2024-11-23 LAB — GENETIC SCREENING ORDER
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
## 2024-11-24 ENCOUNTER — Ambulatory Visit: Payer: Self-pay | Admitting: Student

## 2024-11-24 ENCOUNTER — Encounter: Payer: Self-pay | Admitting: Hematology

## 2024-11-25 ENCOUNTER — Other Ambulatory Visit: Payer: Self-pay | Admitting: Hematology

## 2024-11-25 ENCOUNTER — Ambulatory Visit: Admitting: Student

## 2024-11-25 ENCOUNTER — Other Ambulatory Visit: Payer: Self-pay

## 2024-11-25 ENCOUNTER — Telehealth: Payer: Self-pay | Admitting: *Deleted

## 2024-11-25 VITALS — BP 133/77 | HR 70 | Temp 97.6°F | Wt 210.6 lb

## 2024-11-25 DIAGNOSIS — I10 Essential (primary) hypertension: Secondary | ICD-10-CM

## 2024-11-25 DIAGNOSIS — Z7985 Long-term (current) use of injectable non-insulin antidiabetic drugs: Secondary | ICD-10-CM | POA: Diagnosis not present

## 2024-11-25 DIAGNOSIS — Z170 Estrogen receptor positive status [ER+]: Secondary | ICD-10-CM

## 2024-11-25 DIAGNOSIS — E119 Type 2 diabetes mellitus without complications: Secondary | ICD-10-CM | POA: Diagnosis not present

## 2024-11-25 DIAGNOSIS — R5381 Other malaise: Secondary | ICD-10-CM | POA: Diagnosis not present

## 2024-11-25 DIAGNOSIS — E2839 Other primary ovarian failure: Secondary | ICD-10-CM

## 2024-11-25 DIAGNOSIS — E785 Hyperlipidemia, unspecified: Secondary | ICD-10-CM

## 2024-11-25 LAB — POCT GLYCOSYLATED HEMOGLOBIN (HGB A1C): HbA1c, POC (controlled diabetic range): 6.6 % (ref 0.0–7.0)

## 2024-11-25 LAB — GLUCOSE, CAPILLARY: Glucose-Capillary: 168 mg/dL — ABNORMAL HIGH (ref 70–99)

## 2024-11-25 MED ORDER — OZEMPIC (2 MG/DOSE) 8 MG/3ML ~~LOC~~ SOPN: 2.0000 mg | PEN_INJECTOR | SUBCUTANEOUS | 3 refills | Status: AC

## 2024-11-25 MED ORDER — OLMESARTAN-AMLODIPINE-HCTZ 40-5-25 MG PO TABS: ORAL_TABLET | ORAL | 2 refills | Status: AC

## 2024-11-25 NOTE — Assessment & Plan Note (Signed)
 Last A1c five months ago was 7.1%. Patient has been on Ozempic  1 mg weekly since August of last year and denies any gastrointestinal side effects. Current weight is 210 lbs, down from 219 lbs in October of last year.  Plan   - increase Ozempic  to 2 mg weekly.   - A1c will be rechecked today.

## 2024-11-25 NOTE — Patient Instructions (Signed)
 Thank you, Ms.Montie GORMAN Portal for allowing us  to provide your care today. Today we discussed your need for physical therapy. I will send a referral for that for your. They will be reaching out to you momentarily.   BP Readings from Last 3 Encounters:  11/25/24 133/77  11/23/24 133/72  08/26/24 113/80    Your blood pressure remains elevated today. I will be increasing the dose of one of your medications. Please continue taking it once daily, as you have been doing.   I have ordered the following labs for you:  Lab Orders         Lipid Profile         POC Hbg A1C      Tests ordered today:    Referrals ordered today:   Referral Orders  No referral(s) requested today     I have ordered the following medication/changed the following medications:   Stop the following medications: Medications Discontinued During This Encounter  Medication Reason   OZEMPIC , 1 MG/DOSE, 4 MG/3ML SOPN    Olmesartan -amLODIPine -HCTZ 40-5-12.5 MG TABS      Start the following medications: Meds ordered this encounter  Medications   Semaglutide , 2 MG/DOSE, (OZEMPIC , 2 MG/DOSE,) 8 MG/3ML SOPN    Sig: Inject 2 mg into the skin once a week.    Dispense:  3 mL    Refill:  3   Olmesartan -amLODIPine -HCTZ 40-5-25 MG TABS    Sig: One tab daily    Dispense:  60 tablet    Refill:  2     Follow up: 3 months     Should you have any questions or concerns please call the internal medicine clinic at [phone].   Drue Lisa Grow MD 11/25/2024, 3:35 PM   [facility]

## 2024-11-25 NOTE — Assessment & Plan Note (Signed)
 64 year old female with a history of stroke in 2022 resulting in left-sided hemiparesis, diabetes mellitus, breast cancer status post lumpectomy, and HIV presents with concerns for  needing assistance with standing and adjusting to her home. She is able to cook but becomes fatigued while doing so. She is unable to clean her home due to difficulty standing. She can transfer from her wheelchair to the bed but is unable to perform other activities of daily living independently. Patient has a home health aide who assists with medications. Her primary concerns are weakness and assistance with setting up a handicap-accessible bathroom. - Face- to face/Home Health  order placed

## 2024-11-25 NOTE — Telephone Encounter (Signed)
 U/S of the breast with the breast center appointment 12-02-2024 2:40 pm to arrive 2:10 pm.  Appointment mailed to the patient with the information regarding the 75.00 now show fee.
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
## 2024-11-25 NOTE — Assessment & Plan Note (Signed)
 LDL in January of last year was 59 mg/dL. Patient is currently taking Crestor  20 mg daily. Plan to repeat lipid panel today. - Lipid panel - Continue Crestor  20 mg
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
## 2024-11-25 NOTE — Assessment & Plan Note (Signed)
 BP Readings from Last 3 Encounters:  11/25/24 133/77  11/23/24 133/72  08/26/24 113/80  Blood pressure remains elevated on Olmesartan /Amlodipine /HCTZ 40/5/12.5 mg daily. Patient reports adherence to this regimen. Given her history of stroke, elevated BP is concerning. Plan to increase hydrochlorothiazide  to 25 mg daily and follow up in 4-6 weeks.

## 2024-11-26 LAB — LIPID PANEL
Chol/HDL Ratio: 1.9 ratio (ref 0.0–4.4)
Cholesterol, Total: 159 mg/dL (ref 100–199)
HDL: 84 mg/dL
LDL Chol Calc (NIH): 60 mg/dL (ref 0–99)
Triglycerides: 80 mg/dL (ref 0–149)
VLDL Cholesterol Cal: 15 mg/dL (ref 5–40)

## 2024-11-29 LAB — SIGNATERA
SIGNATERA MTM READOUT: 3.2 MTM/ml — AB
SIGNATERA TEST RESULT: POSITIVE — AB

## 2024-11-30 ENCOUNTER — Ambulatory Visit

## 2024-12-01 ENCOUNTER — Ambulatory Visit: Payer: Self-pay | Admitting: Student

## 2024-12-01 NOTE — Progress Notes (Signed)
 Internal Medicine Clinic Attending  Case discussed with the resident at the time of the visit.  We reviewed the resident's history and exam and pertinent patient test results.  I agree with the assessment, diagnosis, and plan of care documented in the resident's note.

## 2024-12-02 ENCOUNTER — Telehealth: Payer: Self-pay

## 2024-12-02 ENCOUNTER — Encounter

## 2024-12-02 ENCOUNTER — Other Ambulatory Visit

## 2024-12-02 ENCOUNTER — Other Ambulatory Visit: Payer: Self-pay

## 2024-12-02 NOTE — Telephone Encounter (Signed)
 Contacted [facility] and [facility] to inquire patient's next MM/US  appt.  Was advised x [facility], patient canceled her 2023 appt. Was advised x [facility] patient had an appt scheduled for 12/07/24 but patient canceled that appointment, reason unknown.

## 2024-12-03 ENCOUNTER — Telehealth: Payer: Self-pay

## 2024-12-03 ENCOUNTER — Other Ambulatory Visit

## 2024-12-03 ENCOUNTER — Encounter
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
## 2024-12-07 ENCOUNTER — Encounter

## 2024-12-17 ENCOUNTER — Telehealth: Payer: Self-pay | Admitting: *Deleted

## 2024-12-17 NOTE — Telephone Encounter (Signed)
 Will forward to C. Boone.                       Copied from CRM [id number]. Topic: Clinical - Order For Equipment >> Dec 17, 2024  2:35 PM Debby BROCKS wrote: Reason for CRM: Goh, Blondine called on behalf of the patient to inquire about the wheelchair and rollator that was supposed to have been ordered for the patient   Callback: [phone]

## 2024-12-21 ENCOUNTER — Telehealth

## 2024-12-22 ENCOUNTER — Encounter

## 2025-01-04 ENCOUNTER — Inpatient Hospital Stay: Attending: Hematology

## 2025-01-04 ENCOUNTER — Inpatient Hospital Stay: Admitting: Hematology

## 2025-02-15 ENCOUNTER — Inpatient Hospital Stay: Attending: Hematology

## 2025-02-15 ENCOUNTER — Inpatient Hospital Stay: Admitting: Hematology

## 2025-02-23 ENCOUNTER — Ambulatory Visit: Payer: Self-pay | Admitting: Student

## 2025-05-26 ENCOUNTER — Ambulatory Visit
